# Patient Record
Sex: Female | Born: 1950
Health system: Southern US, Community
[De-identification: ages and names within clinical notes are randomized; demographics above are authoritative.]

## PROBLEM LIST (undated history)

## (undated) ENCOUNTER — Ambulatory Visit: Admission: EM

## (undated) DIAGNOSIS — C9000 Multiple myeloma not having achieved remission: Secondary | ICD-10-CM

## (undated) DIAGNOSIS — L659 Nonscarring hair loss, unspecified: Secondary | ICD-10-CM

## (undated) DIAGNOSIS — I712 Thoracic aortic aneurysm, without rupture, unspecified: Secondary | ICD-10-CM

## (undated) DIAGNOSIS — M25561 Pain in right knee: Secondary | ICD-10-CM

## (undated) DIAGNOSIS — R430 Anosmia: Secondary | ICD-10-CM

## (undated) DIAGNOSIS — I10 Essential (primary) hypertension: Secondary | ICD-10-CM

## (undated) DIAGNOSIS — C801 Malignant (primary) neoplasm, unspecified: Secondary | ICD-10-CM

## (undated) DIAGNOSIS — R0602 Shortness of breath: Secondary | ICD-10-CM

## (undated) DIAGNOSIS — E785 Hyperlipidemia, unspecified: Secondary | ICD-10-CM

## (undated) DIAGNOSIS — M2550 Pain in unspecified joint: Secondary | ICD-10-CM

## (undated) DIAGNOSIS — D72819 Decreased white blood cell count, unspecified: Secondary | ICD-10-CM

## (undated) DIAGNOSIS — D649 Anemia, unspecified: Secondary | ICD-10-CM

## (undated) DIAGNOSIS — M25562 Pain in left knee: Secondary | ICD-10-CM

## (undated) DIAGNOSIS — M549 Dorsalgia, unspecified: Secondary | ICD-10-CM

## (undated) DIAGNOSIS — K5900 Constipation, unspecified: Secondary | ICD-10-CM

## (undated) DIAGNOSIS — E669 Obesity, unspecified: Secondary | ICD-10-CM

## (undated) DIAGNOSIS — G629 Polyneuropathy, unspecified: Secondary | ICD-10-CM

## (undated) DIAGNOSIS — R7989 Other specified abnormal findings of blood chemistry: Secondary | ICD-10-CM

## (undated) DIAGNOSIS — K0889 Other specified disorders of teeth and supporting structures: Secondary | ICD-10-CM

## (undated) HISTORY — DX: Shortness of breath: R06.02

## (undated) HISTORY — DX: Anemia, unspecified: D64.9

## (undated) HISTORY — DX: Thoracic aortic aneurysm, without rupture: I71.2

## (undated) HISTORY — PX: CATARACT EXTRACTION: SUR2

## (undated) HISTORY — DX: Pain in right knee: M25.561

## (undated) HISTORY — DX: Multiple myeloma not having achieved remission: C90.00

## (undated) HISTORY — DX: Polyneuropathy, unspecified: G62.9

## (undated) HISTORY — DX: Other specified disorders of teeth and supporting structures: K08.89

## (undated) HISTORY — PX: INJECTION KNEE: SHX2446

## (undated) HISTORY — DX: Hyperlipidemia, unspecified: E78.5

## (undated) HISTORY — DX: Anosmia: R43.0

## (undated) HISTORY — DX: Constipation, unspecified: K59.00

## (undated) HISTORY — DX: Malignant (primary) neoplasm, unspecified: C80.1

## (undated) HISTORY — DX: Other specified abnormal findings of blood chemistry: R79.89

## (undated) HISTORY — DX: Decreased white blood cell count, unspecified: D72.819

## (undated) HISTORY — PX: LIMBAL STEM CELL TRANSPLANT: SHX1969

## (undated) HISTORY — DX: Pain in unspecified joint: M25.50

## (undated) HISTORY — DX: Dorsalgia, unspecified: M54.9

## (undated) HISTORY — DX: Nonscarring hair loss, unspecified: L65.9

## (undated) HISTORY — PX: EYE SURGERY: SHX253

## (undated) HISTORY — DX: Thoracic aortic aneurysm, without rupture, unspecified: I71.20

## (undated) HISTORY — DX: Pain in right knee: M25.562

## (undated) HISTORY — DX: Obesity, unspecified: E66.9

---

## 1998-07-03 ENCOUNTER — Other Ambulatory Visit: Admission: RE | Admit: 1998-07-03 | Discharge: 1998-07-03 | Payer: Self-pay | Admitting: Obstetrics and Gynecology

## 1999-11-09 ENCOUNTER — Other Ambulatory Visit: Admission: RE | Admit: 1999-11-09 | Discharge: 1999-11-09 | Payer: Self-pay | Admitting: Obstetrics and Gynecology

## 2000-01-19 ENCOUNTER — Encounter: Payer: Self-pay | Admitting: Family Medicine

## 2000-01-19 ENCOUNTER — Encounter: Admission: RE | Admit: 2000-01-19 | Discharge: 2000-01-19 | Payer: Self-pay | Admitting: Family Medicine

## 2002-01-04 ENCOUNTER — Other Ambulatory Visit: Admission: RE | Admit: 2002-01-04 | Discharge: 2002-01-04 | Payer: Self-pay | Admitting: Family Medicine

## 2012-12-27 ENCOUNTER — Other Ambulatory Visit (HOSPITAL_COMMUNITY)
Admission: RE | Admit: 2012-12-27 | Discharge: 2012-12-27 | Disposition: A | Discharge status: Home / Self Care | Payer: BC Managed Care – PPO | Source: Ambulatory Visit | Attending: Family Medicine | Admitting: Family Medicine

## 2012-12-27 DIAGNOSIS — Z01419 Encounter for gynecological examination (general) (routine) without abnormal findings: Secondary | ICD-10-CM | POA: Insufficient documentation

## 2012-12-27 DIAGNOSIS — Z1151 Encounter for screening for human papillomavirus (HPV): Secondary | ICD-10-CM | POA: Insufficient documentation

## 2013-11-21 ENCOUNTER — Ambulatory Visit
Admission: RE | Admit: 2013-11-21 | Discharge: 2013-11-21 | Disposition: A | Discharge status: Home / Self Care | Payer: BC Managed Care – PPO | Source: Ambulatory Visit | Attending: Allergy and Immunology | Admitting: Allergy and Immunology

## 2013-11-21 ENCOUNTER — Other Ambulatory Visit: Payer: Self-pay | Admitting: Allergy and Immunology

## 2013-11-21 DIAGNOSIS — R059 Cough, unspecified: Secondary | ICD-10-CM

## 2013-11-21 DIAGNOSIS — R05 Cough: Secondary | ICD-10-CM

## 2016-02-12 ENCOUNTER — Encounter: Payer: Self-pay | Admitting: Hematology and Oncology

## 2016-02-12 ENCOUNTER — Telehealth: Payer: Self-pay | Admitting: Hematology and Oncology

## 2016-02-12 NOTE — Telephone Encounter (Signed)
Pt returned my call. Appt scheduled with Gudena on 10/24 at 1pm. Demographics verified, letter mailed to the patient.Voiced understanding.

## 2016-03-09 ENCOUNTER — Ambulatory Visit (HOSPITAL_BASED_OUTPATIENT_CLINIC_OR_DEPARTMENT_OTHER): Payer: BC Managed Care – PPO | Admitting: Hematology and Oncology

## 2016-03-09 ENCOUNTER — Encounter: Payer: Self-pay | Admitting: Hematology and Oncology

## 2016-03-09 VITALS — BP 135/62 | HR 77 | Temp 98.2°F | Resp 18 | Ht 69.0 in | Wt 283.8 lb

## 2016-03-09 DIAGNOSIS — D649 Anemia, unspecified: Secondary | ICD-10-CM | POA: Diagnosis not present

## 2016-03-09 NOTE — Progress Notes (Signed)
[facility] NOTE  Patient Care Team: Lucianne Lei, MD as PCP - General (Family Medicine)  CHIEF COMPLAINTS/PURPOSE OF CONSULTATION:  Anemia and mild leukopenia  HISTORY OF PRESENTING ILLNESS:  Carolyn Newman 65 y.o. female is here because of her history of anemia and leukopenia. Patient does with that she was told that she may have anemia about a year ago. Apparently it was not quite severe. Or time it appears that her hemoglobin appears to decline slowly. Most recent hemoglobin was 9.7 in August 2017. She was also noted to have mild leukopenia. She has not had any recent infections or illnesses. Extensive workup done by her primary care physician revealed that she has normal iron studies, normal H-82 and folic acid and normal TSH. Patient also normal kidney and liver function.patient has not had any episodes of bleeding.  I reviewed her records extensively and collaborated the history with the patient.  MEDICAL HISTORY:  Diabetes, hypertension, hyperlipidemia SURGICAL HISTORY: tonsillectomy SOCIAL HISTORY: Denies any tobacco alcohol or depression drug use FAMILY HISTORY: Positive for anemia ALLERGIES:  has no allergies on file.  MEDICATIONS:  Current Outpatient Prescriptions  Medication Sig Dispense Refill  . ASPIRIN 81 PO Take 81 mg by mouth daily.    . Cholecalciferol (VITAMIN D3 PO) Take 5,000 Int'l Units by mouth daily.    . Cyanocobalamin (VITAMIN B-12 PO) Take 5,000 mcg by mouth 2 (two) times daily.    Marland Kitchen FIBER PO Take by mouth daily.    . Iron-FA-B Cmp-C-Biot-Probiotic (FUSION PLUS) CAPS TK 1 C PO QD  2  . losartan-hydrochlorothiazide (HYZAAR) 100-25 MG tablet     . Multiple Vitamin (MULTIVITAMIN) tablet Take 1 tablet by mouth daily.    . Omega-3 Fatty Acids (OMEGA-3 FISH OIL PO) Take by mouth daily.     No current facility-administered medications for this visit.     REVIEW OF SYSTEMS:   Constitutional: Denies fevers, chills or abnormal night  sweats Eyes: Denies blurriness of vision, double vision or watery eyes Ears, nose, mouth, throat, and face: Denies mucositis or sore throat Respiratory: Denies cough, dyspnea or wheezes Cardiovascular: Denies palpitation, chest discomfort or lower extremity swelling Gastrointestinal:  Denies nausea, heartburn or change in bowel habits Skin: Denies abnormal skin rashes Lymphatics: Denies new lymphadenopathy or easy bruising Neurological:Denies numbness, tingling or new weaknesses Behavioral/Psych: Mood is stable, no new changes   All other systems were reviewed with the patient and are negative.  PHYSICAL EXAMINATION: ECOG PERFORMANCE STATUS: 1 - Symptomatic but completely ambulatory  Vitals:   03/09/16 1253  BP: 135/62  Pulse: 77  Resp: 18  Temp: 98.2 F (36.8 C)   Filed Weights   03/09/16 1253  Weight: 283 lb 12.8 oz (128.7 kg)    GENERAL:alert, no distress and comfortable SKIN: skin color, texture, turgor are normal, no rashes or significant lesions EYES: normal, conjunctiva are pink and non-injected, sclera clear OROPHARYNX:no exudate, no erythema and lips, buccal mucosa, and tongue normal  NECK: supple, thyroid normal size, non-tender, without nodularity LYMPH:  no palpable lymphadenopathy in the cervical, axillary or inguinal LUNGS: clear to auscultation and percussion with normal breathing effort HEART: regular rate & rhythm and no murmurs and no lower extremity edema ABDOMEN:abdomen soft, non-tender and normal bowel sounds Musculoskeletal:no cyanosis of digits and no clubbing  PSYCH: alert & oriented x 3 with fluent speech NEURO: no focal motor/sensory deficits  LABORATORY DATA:  I have reviewed the data as listed No results found for: WBC, HGB, HCT,  MCV, PLT No results found for: NA, K, CL, CO2  RADIOGRAPHIC STUDIES: I have personally reviewed the radiological reports and agreed with the findings in the report.  ASSESSMENT AND PLAN:  Normocytic anemia: I  discussed with the patient the differential diagnosis of normocytic anemia includes 1. Combined I-51 and folic acid deficiencies 2. Anemia of chronic disease 3. Hypothyroidism 4. Multiple myeloma 5. Bone marrow disorders like MDS  Blood work done by her primary care physician did not show any evidence of iron deficiency. There was no evidence of any G-98 or folic acid deficiencies. Kidney and renal functions along with serum calcium levels were normal. Based on at least a one-year duration of her anemia, recommended that she obtain a bone marrow biopsy. We will also draw blood work for serum protein electrophoresis.  Return to clinic after bone marrow biopsy.  All questions were answered. The patient knows to call the clinic with any problems, questions or concerns.  Rulon Eisenmenger, MD 03/09/16

## 2016-03-16 ENCOUNTER — Other Ambulatory Visit: Payer: BC Managed Care – PPO

## 2016-03-19 ENCOUNTER — Other Ambulatory Visit: Payer: Self-pay | Admitting: Physician Assistant

## 2016-03-22 ENCOUNTER — Ambulatory Visit (HOSPITAL_COMMUNITY)
Admission: RE | Admit: 2016-03-22 | Discharge: 2016-03-22 | Disposition: A | Discharge status: Home / Self Care | Payer: BC Managed Care – PPO | Source: Ambulatory Visit | Attending: Hematology and Oncology | Admitting: Hematology and Oncology

## 2016-03-22 ENCOUNTER — Encounter (HOSPITAL_COMMUNITY): Payer: Self-pay

## 2016-03-22 DIAGNOSIS — D72819 Decreased white blood cell count, unspecified: Secondary | ICD-10-CM | POA: Diagnosis not present

## 2016-03-22 DIAGNOSIS — D708 Other neutropenia: Secondary | ICD-10-CM | POA: Diagnosis not present

## 2016-03-22 DIAGNOSIS — D649 Anemia, unspecified: Secondary | ICD-10-CM

## 2016-03-22 HISTORY — DX: Essential (primary) hypertension: I10

## 2016-03-22 LAB — CBC
HCT: 31.9 % — ABNORMAL LOW (ref 36.0–46.0)
Hemoglobin: 10.4 g/dL — ABNORMAL LOW (ref 12.0–15.0)
MCH: 30.3 pg (ref 26.0–34.0)
MCHC: 32.6 g/dL (ref 30.0–36.0)
MCV: 93 fL (ref 78.0–100.0)
Platelets: 241 10*3/uL (ref 150–400)
RBC: 3.43 MIL/uL — ABNORMAL LOW (ref 3.87–5.11)
RDW: 16.3 % — ABNORMAL HIGH (ref 11.5–15.5)
WBC: 3.5 10*3/uL — ABNORMAL LOW (ref 4.0–10.5)

## 2016-03-22 LAB — BONE MARROW EXAM

## 2016-03-22 LAB — PROTIME-INR
INR: 1.05
Prothrombin Time: 13.8 seconds (ref 11.4–15.2)

## 2016-03-22 LAB — APTT: aPTT: 29 seconds (ref 24–36)

## 2016-03-22 MED ORDER — FENTANYL CITRATE (PF) 100 MCG/2ML IJ SOLN
INTRAMUSCULAR | Status: AC
  Filled 2016-03-22: qty 4

## 2016-03-22 MED ORDER — FENTANYL CITRATE (PF) 100 MCG/2ML IJ SOLN
INTRAMUSCULAR | Status: AC | PRN
  Administered 2016-03-22 (×2): 25 ug via INTRAVENOUS

## 2016-03-22 MED ORDER — MIDAZOLAM HCL 2 MG/2ML IJ SOLN
INTRAMUSCULAR | Status: AC
  Filled 2016-03-22: qty 4

## 2016-03-22 MED ORDER — SODIUM CHLORIDE 0.9 % IV SOLN
INTRAVENOUS | Status: DC
  Administered 2016-03-22: 08:00:00 via INTRAVENOUS

## 2016-03-22 MED ORDER — MIDAZOLAM HCL 2 MG/2ML IJ SOLN
INTRAMUSCULAR | Status: AC | PRN
  Administered 2016-03-22 (×2): 0.5 mg via INTRAVENOUS

## 2016-03-22 NOTE — Consult Note (Signed)
Chief Complaint: Patient was seen in consultation today for CT guided bone marrow biopsy   Referring Physician(s): Brinckerhoff  Supervising Physician: Aletta Edouard  Patient Status: [facility] - Out-pt  History of Present Illness: Carolyn Newman is a 65 y.o. female with history of persistent anemia and leukopenia of unknown origin who presents today for CT guided bone marrow biopsy for further evaluation.   Past Medical History:  Diagnosis Date  . Hypertension     Past Surgical History:  Procedure Laterality Date  . EYE SURGERY      Allergies: Patient has no allergy information on record.  Medications: Prior to Admission medications   Medication Sig Start Date End Date Taking? Authorizing Provider  ASPIRIN 81 PO Take 81 mg by mouth daily.   Yes Historical Provider, MD  Cholecalciferol (VITAMIN D3 PO) Take 5,000 Int'l Units by mouth daily.   Yes Historical Provider, MD  Cyanocobalamin (VITAMIN B-12 PO) Take 5,000 mcg by mouth 2 (two) times daily.   Yes Historical Provider, MD  FIBER PO Take by mouth daily.   Yes Historical Provider, MD  Iron-FA-B Cmp-C-Biot-Probiotic (FUSION PLUS) CAPS TK 1 C PO QD 01/31/16  Yes Historical Provider, MD  losartan-hydrochlorothiazide (HYZAAR) 100-25 MG tablet  02/21/16  Yes Historical Provider, MD  Multiple Vitamin (MULTIVITAMIN) tablet Take 1 tablet by mouth daily.   Yes Historical Provider, MD  Omega-3 Fatty Acids (OMEGA-3 FISH OIL PO) Take by mouth daily.   Yes Historical Provider, MD     History reviewed. No pertinent family history.  Social History   Social History  . Marital status: Single    Spouse name: N/A  . Number of children: N/A  . Years of education: N/A   Social History Main Topics  . Smoking status: Never Smoker  . Smokeless tobacco: Never Used  . Alcohol use No  . Drug use: No  . Sexual activity: Not Asked   Other Topics Concern  . None   Social History Narrative  . None      Review of Systems denies  fever,HA,CP,dyspnea, cough, abd/back pain,N/V or bleeding  Vital Signs: BP 116/76 (BP Location: Right Arm)   Pulse 77   Temp 98 F (36.7 C) (Oral)   Resp 18   SpO2 100%   Physical Exam awake/alert; chest- CTA bilat; heart- RRR; abd- obese, soft,+BS,NT; LE- no edema  Mallampati Score:     Imaging: No results found.  Labs:  CBC:  Recent Labs  03/22/16 0720  WBC 3.5*  HGB 10.4*  HCT 31.9*  PLT 241    COAGS:  Recent Labs  03/22/16 0720  INR 1.05  APTT 29    BMP: No results for input(s): NA, K, CL, CO2, GLUCOSE, BUN, CALCIUM, CREATININE, GFRNONAA, GFRAA in the last 8760 hours.  Invalid input(s): CMP  LIVER FUNCTION TESTS: No results for input(s): BILITOT, AST, ALT, ALKPHOS, PROT, ALBUMIN in the last 8760 hours.  TUMOR MARKERS: No results for input(s): AFPTM, CEA, CA199, CHROMGRNA in the last 8760 hours.  Assessment and Plan: 65 y.o. female with history of persistent anemia and leukopenia of unknown origin who presents today for CT guided bone marrow biopsy for further evaluation. Risks and benefits discussed with the patient including, but not limited to bleeding, infection, damage to adjacent structures or low yield requiring additional tests.All of the patient's questions were answered, patient is agreeable to proceed. Consent signed and in chart.     Thank you for this interesting consult.  I greatly enjoyed  meeting Carolyn Newman and look forward to participating in their care.  A copy of this report was sent to the requesting provider on this date.  Electronically Signed: D. Kevin Allred 03/22/2016, 8:28 AM   I spent a total of 20 minutes in face to face in clinical consultation, greater than 50% of which was counseling/coordinating care for CT guided bone marrow biopsy        

## 2016-03-22 NOTE — Discharge Instructions (Signed)
Moderate Conscious Sedation, Adult, Care After °Refer to this sheet in the next few weeks. These instructions provide you with information on caring for yourself after your procedure. Your health care provider may also give you more specific instructions. Your treatment has been planned according to current medical practices, but problems sometimes occur. Call your health care provider if you have any problems or questions after your procedure. °WHAT TO EXPECT AFTER THE PROCEDURE  °After your procedure: °· You may feel sleepy, clumsy, and have poor balance for several hours. °· Vomiting may occur if you eat too soon after the procedure. °HOME CARE INSTRUCTIONS °· Do not participate in any activities where you could become injured for at least 24 hours. Do not: °¨ Drive. °¨ Swim. °¨ Ride a bicycle. °¨ Operate heavy machinery. °¨ Cook. °¨ Use power tools. °¨ Climb ladders. °¨ Work from a high place. °· Do not make important decisions or sign legal documents until you are improved. °· If you vomit, drink water, juice, or soup when you can drink without vomiting. Make sure you have little or no nausea before eating solid foods. °· Only take over-the-counter or prescription medicines for pain, discomfort, or fever as directed by your health care provider. °· Make sure you and your family fully understand everything about the medicines given to you, including what side effects may occur. °· You should not drink alcohol, take sleeping pills, or take medicines that cause drowsiness for at least 24 hours. °· If you smoke, do not smoke without supervision. °· If you are feeling better, you may resume normal activities 24 hours after you were sedated. °· Keep all appointments with your health care provider. °SEEK MEDICAL CARE IF: °· Your skin is pale or bluish in color. °· You continue to feel nauseous or vomit. °· Your pain is getting worse and is not helped by medicine. °· You have bleeding or swelling. °· You are still  sleepy or feeling clumsy after 24 hours. °SEEK IMMEDIATE MEDICAL CARE IF: °· You develop a rash. °· You have difficulty breathing. °· You develop any type of allergic problem. °· You have a fever. °MAKE SURE YOU: °· Understand these instructions. °· Will watch your condition. °· Will get help right away if you are not doing well or get worse. °  °This information is not intended to replace advice given to you by your health care provider. Make sure you discuss any questions you have with your health care provider. °  °Document Released: 02/21/2013 Document Revised: 05/24/2014 Document Reviewed: 02/21/2013 °Elsevier Interactive Patient Education ©2016 Elsevier Inc. °Bone Marrow Aspiration and Bone Marrow Biopsy, Care After °Refer to this sheet in the next few weeks. These instructions provide you with information about caring for yourself after your procedure. Your health care provider may also give you more specific instructions. Your treatment has been planned according to current medical practices, but problems sometimes occur. Call your health care provider if you have any problems or questions after your procedure. °WHAT TO EXPECT AFTER THE PROCEDURE °After your procedure, it is common to have: °· Soreness or tenderness around the puncture site. °· Bruising. °HOME CARE INSTRUCTIONS °· Take medicines only as directed by your health care provider. °· Follow your health care provider's instructions about: °¨ Puncture site care. °¨ Bandage (dressing) changes and removal. °· Bathe and shower as directed by your health care provider. °· Check your puncture site every day for signs of infection. Watch for: °¨ Redness, swelling, or pain. °¨   pain.  Fluid, blood, or pus.  Return to your normal activities as directed by your health care provider.  Keep all follow-up visits as directed by your health care provider. This is important. SEEK MEDICAL CARE IF:  You have a fever.  You have uncontrollable bleeding.  You have  redness, swelling, or pain at the site of your puncture.  You have fluid, blood, or pus coming from your puncture site.   This information is not intended to replace advice given to you by your health care provider. Make sure you discuss any questions you have with your health care provider.   Document Released: 11/20/2004 Document Revised: 09/17/2014 Document Reviewed: 04/24/2014 Elsevier Interactive Patient Education [employer].

## 2016-03-22 NOTE — Procedures (Signed)
Interventional Radiology Procedure Note  Procedure: CT guided aspirate and core biopsy of right iliac bone Complications: None Recommendations: - Bedrest supine x 1 hrs - Follow biopsy results  Konner Saiz T. Farha Dano, M.D Pager:  [phone]   

## 2016-03-30 LAB — TISSUE HYBRIDIZATION (BONE MARROW)-NCBH

## 2016-03-30 LAB — CHROMOSOME ANALYSIS, BONE MARROW

## 2016-04-04 DIAGNOSIS — C9001 Multiple myeloma in remission: Secondary | ICD-10-CM | POA: Insufficient documentation

## 2016-04-04 DIAGNOSIS — C9000 Multiple myeloma not having achieved remission: Secondary | ICD-10-CM | POA: Insufficient documentation

## 2016-04-04 NOTE — Assessment & Plan Note (Signed)
Multiple Myeloma: Based on BM Bx showing 17% Plasma cells and 30% by CD 138 testing. SPEP, K:L Ratio, B2MG pending from today. Will get Bone Survey Cytogenetics are pending  Discussed treatment options and determined that the best treatment is with 1. RVD 2. Auto Stem cell transplant.  Offered follow up with Dr.Gorsuch

## 2016-04-05 ENCOUNTER — Ambulatory Visit (HOSPITAL_BASED_OUTPATIENT_CLINIC_OR_DEPARTMENT_OTHER): Payer: BC Managed Care – PPO | Admitting: Hematology and Oncology

## 2016-04-05 ENCOUNTER — Other Ambulatory Visit: Payer: Self-pay | Admitting: Hematology and Oncology

## 2016-04-05 ENCOUNTER — Other Ambulatory Visit: Payer: Self-pay

## 2016-04-05 ENCOUNTER — Encounter: Payer: Self-pay | Admitting: Hematology and Oncology

## 2016-04-05 ENCOUNTER — Telehealth: Payer: Self-pay | Admitting: Hematology and Oncology

## 2016-04-05 ENCOUNTER — Ambulatory Visit (HOSPITAL_BASED_OUTPATIENT_CLINIC_OR_DEPARTMENT_OTHER): Payer: BC Managed Care – PPO

## 2016-04-05 VITALS — BP 140/74 | HR 84 | Temp 97.7°F | Resp 19 | Wt 286.9 lb

## 2016-04-05 DIAGNOSIS — Z9484 Stem cells transplant status: Secondary | ICD-10-CM | POA: Diagnosis not present

## 2016-04-05 DIAGNOSIS — C9000 Multiple myeloma not having achieved remission: Secondary | ICD-10-CM

## 2016-04-05 DIAGNOSIS — C9002 Multiple myeloma in relapse: Secondary | ICD-10-CM

## 2016-04-05 LAB — COMPREHENSIVE METABOLIC PANEL
ALT: 26 U/L (ref 0–55)
AST: 30 U/L (ref 5–34)
Albumin: 3.3 g/dL — ABNORMAL LOW (ref 3.5–5.0)
Alkaline Phosphatase: 83 U/L (ref 40–150)
Anion Gap: 7 mEq/L (ref 3–11)
BUN: 20.1 mg/dL (ref 7.0–26.0)
CO2: 26 mEq/L (ref 22–29)
Calcium: 9.7 mg/dL (ref 8.4–10.4)
Chloride: 104 mEq/L (ref 98–109)
Creatinine: 0.9 mg/dL (ref 0.6–1.1)
EGFR: 75 mL/min/{1.73_m2} — ABNORMAL LOW (ref >90)
Glucose: 97 mg/dl (ref 70–140)
Potassium: 3.8 mEq/L (ref 3.5–5.1)
Sodium: 137 mEq/L (ref 136–145)
Total Bilirubin: 0.4 mg/dL (ref 0.20–1.20)
Total Protein: 8.5 g/dL — ABNORMAL HIGH (ref 6.4–8.3)

## 2016-04-05 LAB — CBC WITH DIFFERENTIAL/PLATELET
BASO%: 0.3 % (ref 0.0–2.0)
Basophils Absolute: 0 10*3/uL (ref 0.0–0.1)
EOS%: 2.2 % (ref 0.0–7.0)
Eosinophils Absolute: 0.1 10*3/uL (ref 0.0–0.5)
HCT: 32.3 % — ABNORMAL LOW (ref 34.8–46.6)
HGB: 10.4 g/dL — ABNORMAL LOW (ref 11.6–15.9)
LYMPH%: 30.5 % (ref 14.0–49.7)
MCH: 30.4 pg (ref 25.1–34.0)
MCHC: 32.2 g/dL (ref 31.5–36.0)
MCV: 94.4 fL (ref 79.5–101.0)
MONO#: 0.2 10*3/uL (ref 0.1–0.9)
MONO%: 7.4 % (ref 0.0–14.0)
NEUT#: 1.9 10*3/uL (ref 1.5–6.5)
NEUT%: 59.6 % (ref 38.4–76.8)
Platelets: 237 10*3/uL (ref 145–400)
RBC: 3.42 10*6/uL — ABNORMAL LOW (ref 3.70–5.45)
RDW: 16.3 % — ABNORMAL HIGH (ref 11.2–14.5)
WBC: 3.3 10*3/uL — ABNORMAL LOW (ref 3.9–10.3)
lymph#: 1 10*3/uL (ref 0.9–3.3)
nRBC: 0 % (ref 0–0)

## 2016-04-05 MED ORDER — LENALIDOMIDE 10 MG PO CAPS: 10.0000 mg | ORAL_CAPSULE | Freq: Every day | ORAL | 0 refills | Status: DC

## 2016-04-05 NOTE — Progress Notes (Signed)
Pt set up and registered on [place] website for future Revlimid treatment, per Dr. Lindi Adie request.

## 2016-04-05 NOTE — Telephone Encounter (Signed)
Gave patient avs report and appointments for December and January. Per 11/20 los from [facility] patient to f/u w/NG 12/4 and start tx.   Central radiology will call re bone survey.

## 2016-04-05 NOTE — Progress Notes (Signed)
Patient Care Team: Lucianne Lei, MD as PCP - General (Family Medicine)  DIAGNOSIS:  Encounter Diagnosis  Name Primary?  . Multiple myeloma not having achieved remission ([place])     SUMMARY OF ONCOLOGIC HISTORY:   Multiple myeloma ([place])   03/25/2016 Initial Diagnosis    Bone Marrow Biopsy: Plasma cells 17% by Aspirate and 30% by CD 138 stain. Plasma cell neoplasm, Kappa Restricted       CHIEF COMPLIANT: Follow-up to discuss a bone marrow biopsy report  INTERVAL HISTORY: Carolyn Newman is a 65 year old with above-mentioned history of recent bone marrow biopsy showing excess plasma cells. She was originally referred to me for workup of anemia and leukopenia. Prior workup by Dr. Criss Rosales did not reveal any abnormalities of iron or folic acid or R-74 or TSH. She also had normal kidney and liver function tests. There was no bleeding episodes. Because the anemia was normocytic, we obtained a bone marrow biopsy. She is here today to discuss the biopsy report. Previous workup ordered was unfortunately not completed. She was supposed to have serum protein electrophoresis.  REVIEW OF SYSTEMS:   Constitutional: Denies fevers, chills or abnormal weight loss, fatigue Eyes: Denies blurriness of vision Ears, nose, mouth, throat, and face: Denies mucositis or sore throat Respiratory: Denies cough, dyspnea or wheezes Cardiovascular: Denies palpitation, chest discomfort Gastrointestinal:  Denies nausea, heartburn or change in bowel habits Skin: Denies abnormal skin rashes Lymphatics: Denies new lymphadenopathy or easy bruising Neurological:Denies numbness, tingling or new weaknesses Behavioral/Psych: Mood is stable, no new changes  Extremities: No lower extremity edema All other systems were reviewed with the patient and are negative.  I have reviewed the past medical history, past surgical history, social history and family history with the patient and they are unchanged from previous  note.  ALLERGIES:  has no allergies on file.  MEDICATIONS:  Current Outpatient Prescriptions  Medication Sig Dispense Refill  . ASPIRIN 81 PO Take 81 mg by mouth daily.    . Cholecalciferol (VITAMIN D3 PO) Take 5,000 Int'l Units by mouth daily.    . Cyanocobalamin (VITAMIN B-12 PO) Take 5,000 mcg by mouth 2 (two) times daily.    Marland Kitchen FIBER PO Take by mouth daily.    . Iron-FA-B Cmp-C-Biot-Probiotic (FUSION PLUS) CAPS TK 1 C PO QD  2  . losartan-hydrochlorothiazide (HYZAAR) 100-25 MG tablet     . Multiple Vitamin (MULTIVITAMIN) tablet Take 1 tablet by mouth daily.    . Omega-3 Fatty Acids (OMEGA-3 FISH OIL PO) Take by mouth daily.     No current facility-administered medications for this visit.     PHYSICAL EXAMINATION: ECOG PERFORMANCE STATUS: 1 - Symptomatic but completely ambulatory  There were no vitals filed for this visit. There were no vitals filed for this visit.  GENERAL:alert, no distress and comfortable SKIN: skin color, texture, turgor are normal, no rashes or significant lesions EYES: normal, Conjunctiva are pink and non-injected, sclera clear OROPHARYNX:no exudate, no erythema and lips, buccal mucosa, and tongue normal  NECK: supple, thyroid normal size, non-tender, without nodularity LYMPH:  no palpable lymphadenopathy in the cervical, axillary or inguinal LUNGS: clear to auscultation and percussion with normal breathing effort HEART: regular rate & rhythm and no murmurs and no lower extremity edema ABDOMEN:abdomen soft, non-tender and normal bowel sounds MUSCULOSKELETAL:no cyanosis of digits and no clubbing  NEURO: alert & oriented x 3 with fluent speech, no focal motor/sensory deficits EXTREMITIES: No lower extremity edema  LABORATORY DATA:  I have reviewed the data as  listed   Chemistry   No results found for: NA, K, CL, CO2, BUN, CREATININE, GLU No results found for: CALCIUM, ALKPHOS, AST, ALT, BILITOT     Lab Results  Component Value Date   WBC 3.5 (L)  03/22/2016   HGB 10.4 (L) 03/22/2016   HCT 31.9 (L) 03/22/2016   MCV 93.0 03/22/2016   PLT 241 03/22/2016    ASSESSMENT & PLAN:  Multiple myeloma ([facility]) Multiple Myeloma: Based on BM Bx showing 17% Plasma cells and 30% by CD 138 testing. SPEP, K:L Ratio, B2MG pending from today. Will get Bone Survey Cytogenetics are pending With also get 24 hour urine for light chains  Discussed treatment options and determined that the best treatment is with  1. RVD: Revlimid 3 weeks on one week off; Velcade days 1,4,8,11 every 4 weeks subcutaneous, dexamethasone 20 mg orally weekly. I discussed the risks and benefits of the treatments. Revlimid will be a pain through the STEPS program.   Patient understands the toxicities of Revlimid could include cytopenias, rashes, GI toxicities, bone marrow toxicities etc. The side effects of Velcade include thrombocytopenia, neuropathy Side effects of dexamethasone include reactivation of shingles, elevated blood sugars etc Patient understands these risks and is willing to proceed with the treatment.  2. Auto Stem cell transplant.  Patient will follow up with Dr.Gorsuch on December 4 to start therapy.  No orders of the defined types were placed in this encounter.  The patient has a good understanding of the overall plan. she agrees with it. she will call with any problems that may develop before the next visit here.   Rulon Eisenmenger, MD 04/05/16

## 2016-04-05 NOTE — Progress Notes (Signed)
START ON PATHWAY REGIMEN - Multiple Myeloma  [id number]: VRd (Bortezomib 1.3 mg/m2 Subcut D1, 4, 8, 11 + Lenalidomide 25 mg + Dexamethasone 20 mg) q21 Days x 4-6 Cycles Maximum Prior to Stem Cell Harvest   A cycle is every 21 days:     Bortezomib (Velcade(R)) 1.3 mg/m2 subcut twice weekly on days 1,4,8, and 11 Dose Mod: None     Lenalidomide (Revlimid(R)) 25 mg orally days 1-14 Dose Mod: None     Dexamethasone (Decadron(R)) 20 mg orally days 1, 2, 4, 5, 8, 9, 11, and 12 Dose Mod: None Additional Orders: Herpes zoster prophylaxis recommended. Antithrombotic therapy: aspirin 81-325 mg PO daily for patients at low risk; enoxaparin 40 mg subcut daily or warfarin to an INR of 2-3 for patients at higher risk.  **Always confirm dose/schedule in your pharmacy ordering system**    Patient Characteristics: Newly Diagnosed, Transplant Eligible, Standard Risk R-ISS Staging: Unknown Disease Classification: Newly Diagnosed Is Patient Eligible for Transplant? Transplant Eligible Risk Status: Standard Risk  Intent of Therapy: Non-Curative / Palliative Intent, Discussed with Patient

## 2016-04-06 ENCOUNTER — Other Ambulatory Visit: Payer: Self-pay | Admitting: *Deleted

## 2016-04-06 LAB — KAPPA/LAMBDA LIGHT CHAINS
Ig Kappa Free Light Chain: 76 mg/L — ABNORMAL HIGH (ref 3.3–19.4)
Ig Lambda Free Light Chain: 9 mg/L (ref 5.7–26.3)
Kappa/Lambda FluidC Ratio: 8.44 — ABNORMAL HIGH (ref 0.26–1.65)

## 2016-04-06 LAB — BETA 2 MICROGLOBULIN, SERUM: Beta-2: 2.2 mg/L (ref 0.6–2.4)

## 2016-04-06 MED ORDER — LENALIDOMIDE 10 MG PO CAPS: 10.0000 mg | ORAL_CAPSULE | Freq: Every day | ORAL | 0 refills | Status: DC

## 2016-04-07 ENCOUNTER — Telehealth: Payer: Self-pay | Admitting: *Deleted

## 2016-04-07 ENCOUNTER — Encounter (HOSPITAL_COMMUNITY): Payer: Self-pay

## 2016-04-07 ENCOUNTER — Telehealth: Payer: Self-pay | Admitting: Pharmacist

## 2016-04-07 LAB — MULTIPLE MYELOMA PANEL, SERUM
Albumin SerPl Elph-Mcnc: 3.6 g/dL (ref 2.9–4.4)
Albumin/Glob SerPl: 0.9 (ref 0.7–1.7)
Alpha 1: 0.2 g/dL (ref 0.0–0.4)
Alpha2 Glob SerPl Elph-Mcnc: 0.7 g/dL (ref 0.4–1.0)
B-Globulin SerPl Elph-Mcnc: 1 g/dL (ref 0.7–1.3)
Gamma Glob SerPl Elph-Mcnc: 2.2 g/dL — ABNORMAL HIGH (ref 0.4–1.8)
Globulin, Total: 4.2 g/dL — ABNORMAL HIGH (ref 2.2–3.9)
IgA, Qn, Serum: 56 mg/dL — ABNORMAL LOW (ref 87–352)
IgG, Qn, Serum: 2660 mg/dL — ABNORMAL HIGH (ref 700–1600)
IgM, Qn, Serum: 21 mg/dL — ABNORMAL LOW (ref 26–217)
M Protein SerPl Elph-Mcnc: 2 g/dL — ABNORMAL HIGH
Total Protein: 7.8 g/dL (ref 6.0–8.5)

## 2016-04-07 NOTE — Telephone Encounter (Signed)
Oral Chemotherapy Pharmacist Encounter  Received notification that patient's Revlimid would require prior authorization. [facility] clinical questions answered. Form faxed back to [phone].  Oral Chemo Clinic will continue to follow.  Johny Drilling, PharmD, BCPS 04/07/2016  4:51 PM Oral Chemotherapy Clinic [phone]

## 2016-04-07 NOTE — Telephone Encounter (Signed)
VM from [place] at [place].  States need Prior Auth on Revlimid.  Call phone# [phone] to obtain Prior Auth.

## 2016-04-09 NOTE — Telephone Encounter (Signed)
Oral Chemotherapy Pharmacist Encounter  Received notification from [place] that prior authorization for Revlimid has been approved through 04/09/2017.  I called [facility] to alert them to re-run prescription. Rx did go through and there was a copay. They would no share with me copayment amount, they will only share this information with the patient. [facility] will reach out to patient for copayment issues and to set up delivery, etc.  I LVM for patient with above information, offer for counseling, and offer to help with any copayment issues.  Oral Chemo Clinic will continue to follow.  Johny Drilling, PharmD, BCPS 04/09/2016  1:47 PM Oral Chemotherapy Clinic [phone]

## 2016-04-09 NOTE — Telephone Encounter (Signed)
Oral Chemotherapy Pharmacist Encounter  Patient returned call and had already spoken with [facility], copay $200, patient can afford to pay this amount at this time.  I offered initial counseling and patient declined as she had already spoken with a [facility] Pharmacist, except to talk about safe handling directions for use.  We also briefly discussed the [facility] program surrounding Revlimid, efficacy of use, and dosing schedule of Velcade.  Patient looking forward to chemo education class on 11/28. She will reach out to [facility] if she needs help identifying copayment assistance in the future..  Patient understands and is in agreement with plan.  Oral Chemo Clinic will continue to follow for toxicity and adherence management.  Johny Drilling, PharmD, BCPS 04/09/2016  2:31 PM Oral Chemotherapy Clinic [phone]

## 2016-04-12 ENCOUNTER — Other Ambulatory Visit: Payer: BC Managed Care – PPO

## 2016-04-13 ENCOUNTER — Ambulatory Visit (HOSPITAL_COMMUNITY)
Admission: RE | Admit: 2016-04-13 | Discharge: 2016-04-13 | Disposition: A | Discharge status: Home / Self Care | Payer: BC Managed Care – PPO | Source: Ambulatory Visit | Attending: Hematology and Oncology | Admitting: Hematology and Oncology

## 2016-04-13 ENCOUNTER — Other Ambulatory Visit: Payer: BC Managed Care – PPO

## 2016-04-13 ENCOUNTER — Encounter: Payer: Self-pay | Admitting: *Deleted

## 2016-04-13 DIAGNOSIS — M1712 Unilateral primary osteoarthritis, left knee: Secondary | ICD-10-CM | POA: Insufficient documentation

## 2016-04-13 DIAGNOSIS — C9000 Multiple myeloma not having achieved remission: Secondary | ICD-10-CM | POA: Insufficient documentation

## 2016-04-13 DIAGNOSIS — M19012 Primary osteoarthritis, left shoulder: Secondary | ICD-10-CM | POA: Insufficient documentation

## 2016-04-15 LAB — UIFE/LIGHT CHAINS/TP QN, 24-HR UR
FR KAPPA LT CH,24HR: 157 mg/(24.h)
FR LAMBDA LT CH,24HR: 2 mg/(24.h)
Free Kappa Lt Chains,Ur: 112 mg/L — ABNORMAL HIGH (ref 1.35–24.19)
Free Lambda Lt Chains,Ur: 1.47 mg/L (ref 0.24–6.66)
Kappa/Lambda Ratio,U: 76.19 — ABNORMAL HIGH (ref 2.04–10.37)
PROTEIN,TOTAL,URINE: 6 mg/dL
Prot,24hr calculated: 84 mg/(24.h) (ref 30–150)

## 2016-04-16 ENCOUNTER — Encounter (HOSPITAL_COMMUNITY): Payer: Self-pay

## 2016-04-19 ENCOUNTER — Ambulatory Visit (HOSPITAL_BASED_OUTPATIENT_CLINIC_OR_DEPARTMENT_OTHER): Payer: BC Managed Care – PPO

## 2016-04-19 ENCOUNTER — Ambulatory Visit (HOSPITAL_BASED_OUTPATIENT_CLINIC_OR_DEPARTMENT_OTHER): Payer: BC Managed Care – PPO | Admitting: Hematology and Oncology

## 2016-04-19 ENCOUNTER — Other Ambulatory Visit (HOSPITAL_BASED_OUTPATIENT_CLINIC_OR_DEPARTMENT_OTHER): Payer: BC Managed Care – PPO

## 2016-04-19 ENCOUNTER — Encounter: Payer: Self-pay | Admitting: Hematology and Oncology

## 2016-04-19 ENCOUNTER — Telehealth: Payer: Self-pay | Admitting: Hematology and Oncology

## 2016-04-19 VITALS — BP 137/66 | HR 55 | Temp 98.2°F | Resp 18 | Ht 69.0 in | Wt 285.4 lb

## 2016-04-19 VITALS — BP 128/93 | HR 58 | Temp 98.1°F | Resp 18

## 2016-04-19 DIAGNOSIS — C9000 Multiple myeloma not having achieved remission: Secondary | ICD-10-CM

## 2016-04-19 DIAGNOSIS — D649 Anemia, unspecified: Secondary | ICD-10-CM

## 2016-04-19 DIAGNOSIS — Z5112 Encounter for antineoplastic immunotherapy: Secondary | ICD-10-CM

## 2016-04-19 DIAGNOSIS — D61818 Other pancytopenia: Secondary | ICD-10-CM | POA: Insufficient documentation

## 2016-04-19 LAB — COMPREHENSIVE METABOLIC PANEL
ALT: 16 U/L (ref 0–55)
AST: 22 U/L (ref 5–34)
Albumin: 3.2 g/dL — ABNORMAL LOW (ref 3.5–5.0)
Alkaline Phosphatase: 85 U/L (ref 40–150)
Anion Gap: 9 mEq/L (ref 3–11)
BUN: 20.5 mg/dL (ref 7.0–26.0)
CO2: 25 mEq/L (ref 22–29)
Calcium: 9.6 mg/dL (ref 8.4–10.4)
Chloride: 105 mEq/L (ref 98–109)
Creatinine: 0.9 mg/dL (ref 0.6–1.1)
EGFR: 79 mL/min/{1.73_m2} — ABNORMAL LOW (ref >90)
Glucose: 80 mg/dl (ref 70–140)
Potassium: 3.7 mEq/L (ref 3.5–5.1)
Sodium: 138 mEq/L (ref 136–145)
Total Bilirubin: 0.46 mg/dL (ref 0.20–1.20)
Total Protein: 8.4 g/dL — ABNORMAL HIGH (ref 6.4–8.3)

## 2016-04-19 LAB — CBC WITH DIFFERENTIAL/PLATELET
BASO%: 0.3 % (ref 0.0–2.0)
Basophils Absolute: 0 10*3/uL (ref 0.0–0.1)
EOS%: 1.9 % (ref 0.0–7.0)
Eosinophils Absolute: 0.1 10*3/uL (ref 0.0–0.5)
HCT: 31.8 % — ABNORMAL LOW (ref 34.8–46.6)
HGB: 10.3 g/dL — ABNORMAL LOW (ref 11.6–15.9)
LYMPH%: 40.6 % (ref 14.0–49.7)
MCH: 30.6 pg (ref 25.1–34.0)
MCHC: 32.4 g/dL (ref 31.5–36.0)
MCV: 94.4 fL (ref 79.5–101.0)
MONO#: 0.2 10*3/uL (ref 0.1–0.9)
MONO%: 7 % (ref 0.0–14.0)
NEUT#: 1.6 10*3/uL (ref 1.5–6.5)
NEUT%: 50.2 % (ref 38.4–76.8)
Platelets: 228 10*3/uL (ref 145–400)
RBC: 3.37 10*6/uL — ABNORMAL LOW (ref 3.70–5.45)
RDW: 15.7 % — ABNORMAL HIGH (ref 11.2–14.5)
WBC: 3.2 10*3/uL — ABNORMAL LOW (ref 3.9–10.3)
lymph#: 1.3 10*3/uL (ref 0.9–3.3)

## 2016-04-19 MED ORDER — ONDANSETRON HCL 8 MG PO TABS: 8.0000 mg | ORAL_TABLET | Freq: Two times a day (BID) | ORAL | 1 refills | Status: DC | PRN

## 2016-04-19 MED ORDER — PROCHLORPERAZINE MALEATE 10 MG PO TABS: 10.0000 mg | ORAL_TABLET | Freq: Four times a day (QID) | ORAL | 1 refills | Status: DC | PRN

## 2016-04-19 MED ORDER — BORTEZOMIB CHEMO SQ INJECTION 3.5 MG (2.5MG/ML)
1.3000 mg/m2 | Freq: Once | INTRAMUSCULAR | Status: AC
  Administered 2016-04-19: 3.25 mg via SUBCUTANEOUS
  Filled 2016-04-19: qty 3.25

## 2016-04-19 MED ORDER — DEXAMETHASONE 4 MG PO TABS: ORAL_TABLET | ORAL | 3 refills | Status: DC

## 2016-04-19 MED ORDER — PROCHLORPERAZINE MALEATE 10 MG PO TABS
10.0000 mg | ORAL_TABLET | Freq: Once | ORAL | Status: AC
  Administered 2016-04-19: 10 mg via ORAL

## 2016-04-19 MED ORDER — PROCHLORPERAZINE MALEATE 10 MG PO TABS
ORAL_TABLET | ORAL | Status: AC
Start: 2016-04-19 — End: 2016-04-19
  Filled 2016-04-19: qty 1

## 2016-04-19 MED ORDER — ACYCLOVIR 400 MG PO TABS
400.0000 mg | ORAL_TABLET | Freq: Two times a day (BID) | ORAL | 3 refills | Status: DC
Start: 2016-04-19 — End: 2016-09-04

## 2016-04-19 NOTE — Assessment & Plan Note (Signed)
The pancytopenia is likely due to untreated bone marrow disease. She is not symptomatic. We will monitor blood counts carefully.

## 2016-04-19 NOTE — Progress Notes (Signed)
Per Dr. Alvy Bimler patient not to receive Zometa today. Patient is to have a baseline dental check-up before proceeding with Zometa. Patient aware and verbalized understanding.  Per Dr. Alvy Bimler, patient not to receive Decadron 40mg  po before treatment today.

## 2016-04-19 NOTE — Patient Instructions (Addendum)
[facility] Discharge Instructions for Patients Receiving Chemotherapy  Today you received the following chemotherapy agents Velcade  To help prevent nausea and vomiting after your treatment, we encourage you to take your nausea medication as directed.   If you develop nausea and vomiting that is not controlled by your nausea medication, call the clinic.   BELOW ARE SYMPTOMS THAT SHOULD BE REPORTED IMMEDIATELY:  *FEVER GREATER THAN 100.5 F  *CHILLS WITH OR WITHOUT FEVER  NAUSEA AND VOMITING THAT IS NOT CONTROLLED WITH YOUR NAUSEA MEDICATION  *UNUSUAL SHORTNESS OF BREATH  *UNUSUAL BRUISING OR BLEEDING  TENDERNESS IN MOUTH AND THROAT WITH OR WITHOUT PRESENCE OF ULCERS  *URINARY PROBLEMS  *BOWEL PROBLEMS  UNUSUAL RASH Items with * indicate a potential emergency and should be followed up as soon as possible.  Feel free to call the clinic you have any questions or concerns. The clinic phone number is [phone].  Please show the CHEMO ALERT CARD at check-in to the Emergency Department and triage nurse.    Bortezomib injection What is this medicine? BORTEZOMIB (bor TEZ oh mib) is a medicine that targets proteins in cancer cells and stops the cancer cells from growing. It is used to treat multiple myeloma and mantle-cell lymphoma. This medicine may be used for other purposes; ask your health care provider or pharmacist if you have questions. COMMON BRAND NAME(S): Velcade What should I tell my health care provider before I take this medicine? They need to know if you have any of these conditions: -diabetes -heart disease -irregular heartbeat -liver disease -on hemodialysis -low blood counts, like low white blood cells, platelets, or hemoglobin -peripheral neuropathy -taking medicine for blood pressure -an unusual or allergic reaction to bortezomib, mannitol, boron, other medicines, foods, dyes, or preservatives -pregnant or trying to get  pregnant -breast-feeding How should I use this medicine? This medicine is for injection into a vein or for injection under the skin. It is given by a health care professional in a hospital or clinic setting. Talk to your pediatrician regarding the use of this medicine in children. Special care may be needed. Overdosage: If you think you have taken too much of this medicine contact a poison control center or emergency room at once. NOTE: This medicine is only for you. Do not share this medicine with others. What if I miss a dose? It is important not to miss your dose. Call your doctor or health care professional if you are unable to keep an appointment. What may interact with this medicine? This medicine may interact with the following medications: -ketoconazole -rifampin -ritonavir -St. John's Wort This list may not describe all possible interactions. Give your health care provider a list of all the medicines, herbs, non-prescription drugs, or dietary supplements you use. Also tell them if you smoke, drink alcohol, or use illegal drugs. Some items may interact with your medicine. What should I watch for while using this medicine? You may get drowsy or dizzy. Do not drive, use machinery, or do anything that needs mental alertness until you know how this medicine affects you. Do not stand or sit up quickly, especially if you are an older patient. This reduces the risk of dizzy or fainting spells. In some cases, you may be given additional medicines to help with side effects. Follow all directions for their use. Call your doctor or health care professional for advice if you get a fever, chills or sore throat, or other symptoms of a cold or flu. Do not   treat yourself. This drug decreases your body's ability to fight infections. Try to avoid being around people who are sick. This medicine may increase your risk to bruise or bleed. Call your doctor or health care professional if you notice any unusual  bleeding. You may need blood work done while you are taking this medicine. In some patients, this medicine may cause a serious brain infection that may cause death. If you have any problems seeing, thinking, speaking, walking, or standing, tell your doctor right away. If you cannot reach your doctor, urgently seek other source of medical care. Check with your doctor or health care professional if you get an attack of severe diarrhea, nausea and vomiting, or if you sweat a lot. The loss of too much body fluid can make it dangerous for you to take this medicine. Do not become pregnant while taking this medicine or for at least 2 months after stopping it. Women should inform their doctor if they wish to become pregnant or think they might be pregnant. Men should not father a child while taking this medicine and for at least 2 months after stopping it. There is a potential for serious side effects to an unborn child. Talk to your health care professional or pharmacist for more information. Do not breast-feed an infant while taking this medicine or for 2 months after stopping it. This medicine may interfere with the ability to have a child. You should talk with your doctor or health care professional if you are concerned about your fertility. What side effects may I notice from receiving this medicine? Side effects that you should report to your doctor or health care professional as soon as possible: -allergic reactions like skin rash, itching or hives, swelling of the face, lips, or tongue -breathing problems -changes in hearing -changes in vision -fast, irregular heartbeat -feeling faint or lightheaded, falls -pain, tingling, numbness in the hands or feet -right upper belly pain -seizures -swelling of the ankles, feet, hands -unusual bleeding or bruising -unusually weak or tired -vomiting -yellowing of the eyes or skin Side effects that usually do not require medical attention (report to your  doctor or health care professional if they continue or are bothersome): -changes in emotions or moods -constipation -diarrhea -loss of appetite -headache -irritation at site where injected -nausea This list may not describe all possible side effects. Call your doctor for medical advice about side effects. You may report side effects to FDA at 1-800-FDA-1088. Where should I keep my medicine? This drug is given in a hospital or clinic and will not be stored at home. NOTE: This sheet is a summary. It may not cover all possible information. If you have questions about this medicine, talk to your doctor, pharmacist, or health care provider.  2017 Elsevier/Gold Standard (2015-10-29 18:30:39)   

## 2016-04-19 NOTE — Progress Notes (Signed)
Patient monitored for 30 minutes post injection. Patient and vital signs stable upon discharge.

## 2016-04-19 NOTE — Progress Notes (Signed)
[name] FOLLOW-UP progress notes  Patient Care Team: Lucianne Lei, MD as PCP - General (Family Medicine)  CHIEF COMPLAINTS/PURPOSE OF VISIT:  Newly diagnosed multiple myeloma, IgG kappa  HISTORY OF PRESENTING ILLNESS:  Carolyn Newman 65 y.o. female was transferred to my care.  I reviewed the patient's records extensive and collaborated the history with the patient. Summary of her history is as follows:   Multiple myeloma ([place])   03/22/2016 Bone Marrow Biopsy    Bone Marrow Biopsy: Plasma cells 17% by Aspirate and 30% by CD 138 stain. Plasma cell neoplasm, Kappa Restricted Normal cytogenetics, FISH positive for +11 and +14 and +7      04/13/2016 Imaging    Skeletal survey showed lucencies within the calvarium worrisome for myeloma. No definite abnormal lytic or blastic lesions are observed elsewhere.      04/19/2016 -  Chemotherapy    The patient consented to treatment with Revlimid, dexamethasone and Velcade      Her anemia was incidental finding. The patient denies symptoms of anemia. She denies history of recurrent infection. She has mild bone aches and chronic bilateral knee pain but denies new bone pain or fractures. The patient has never received blood transfusion. She has donated blood in the past  MEDICAL HISTORY:  Past Medical History:  Diagnosis Date  . Anemia   . Cancer ([place])    myeloma  . Hypertension     SURGICAL HISTORY: Past Surgical History:  Procedure Laterality Date  . EYE SURGERY      SOCIAL HISTORY: Social History   Social History  . Marital status: Single    Spouse name: N/A  . Number of children: N/A  . Years of education: N/A   Occupational History  . consultant    Social History Main Topics  . Smoking status: Never Smoker  . Smokeless tobacco: Never Used  . Alcohol use No  . Drug use: No  . Sexual activity: Not on file   Other Topics Concern  . Not on file   Social History Narrative  . No narrative on file     FAMILY HISTORY: Family History  Problem Relation Age of Onset  . Cancer Mother     lymphoma  . Cancer Father     prostate ca    ALLERGIES:  has No Known Allergies.  MEDICATIONS:  Current Outpatient Prescriptions  Medication Sig Dispense Refill  . ASPIRIN 81 PO Take 81 mg by mouth daily.    . Cholecalciferol (VITAMIN D3 PO) Take 5,000 Int'l Units by mouth daily.    . Cyanocobalamin (VITAMIN B-12 PO) Take 5,000 mcg by mouth 2 (two) times daily.    Marland Kitchen FIBER PO Take by mouth daily.    Marland Kitchen lenalidomide (REVLIMID) 10 MG capsule Take 1 capsule (10 mg total) by mouth daily. Take 1 daily for 14 days, rest 7 days 14 capsule 0  . losartan-hydrochlorothiazide (HYZAAR) 100-25 MG tablet     . Multiple Vitamin (MULTIVITAMIN) tablet Take 1 tablet by mouth daily.    . Omega-3 Fatty Acids (OMEGA-3 FISH OIL PO) Take by mouth daily.    . Sodium Fluoride 0.15 % PSTE Use as directed in the mouth or throat.    Marland Kitchen acyclovir (ZOVIRAX) 400 MG tablet Take 1 tablet (400 mg total) by mouth 2 (two) times daily. 60 tablet 3  . dexamethasone (DECADRON) 4 MG tablet Take 10 tablets (40 mg) weekly 30 tablet 3  . Iron-FA-B Cmp-C-Biot-Probiotic (FUSION PLUS) CAPS TK 1 C  PO QD  2  . ondansetron (ZOFRAN) 8 MG tablet Take 1 tablet (8 mg total) by mouth 2 (two) times daily as needed (Nausea or vomiting). 30 tablet 1  . prochlorperazine (COMPAZINE) 10 MG tablet Take 1 tablet (10 mg total) by mouth every 6 (six) hours as needed (Nausea or vomiting). 30 tablet 1   No current facility-administered medications for this visit.     REVIEW OF SYSTEMS:   Constitutional: Denies fevers, chills or abnormal night sweats Eyes: Denies blurriness of vision, double vision or watery eyes Ears, nose, mouth, throat, and face: Denies mucositis or sore throat Respiratory: Denies cough, dyspnea or wheezes Cardiovascular: Denies palpitation, chest discomfort or lower extremity swelling Gastrointestinal:  Denies nausea, heartburn or change  in bowel habits Skin: Denies abnormal skin rashes Lymphatics: Denies new lymphadenopathy or easy bruising Neurological:Denies numbness, tingling or new weaknesses Behavioral/Psych: Mood is stable, no new changes  All other systems were reviewed with the patient and are negative.  PHYSICAL EXAMINATION: ECOG PERFORMANCE STATUS: 1 - Symptomatic but completely ambulatory  Vitals:   04/19/16 1051  BP: 137/66  Pulse: (!) 55  Resp: 18  Temp: 98.2 F (36.8 C)   Filed Weights   04/19/16 1051  Weight: 285 lb 6.4 oz (129.5 kg)    GENERAL:alert, no distress and comfortable SKIN: skin color, texture, turgor are normal, no rashes or significant lesions EYES: normal, conjunctiva are pink and non-injected, sclera clear PSYCH: alert & oriented x 3 with fluent speech NEURO: no focal motor/sensory deficits  LABORATORY DATA:  I have reviewed the data as listed Lab Results  Component Value Date   WBC 3.2 (L) 04/19/2016   HGB 10.3 (L) 04/19/2016   HCT 31.8 (L) 04/19/2016   MCV 94.4 04/19/2016   PLT 228 04/19/2016    Recent Labs  04/05/16 1045 04/05/16 1046 04/19/16 1035  NA  --  137 138  K  --  3.8 3.7  CO2  --  26 25  GLUCOSE  --  97 80  BUN  --  20.1 20.5  CREATININE  --  0.9 0.9  CALCIUM  --  9.7 9.6  PROT 7.8 8.5* 8.4*  ALBUMIN  --  3.3* 3.2*  AST  --  30 22  ALT  --  26 16  ALKPHOS  --  83 85  BILITOT  --  0.40 0.46    RADIOGRAPHIC STUDIES: I have personally reviewed the radiological images as listed and agreed with the findings in the report. Ct Biopsy  Result Date: 03/22/2016 CLINICAL DATA:  Anemia and leukopenia. EXAM: CT GUIDED BONE MARROW ASPIRATION AND BIOPSY ANESTHESIA/SEDATION: Versed 1.0 mg IV, Fentanyl 50 mcg IV Total Moderate Sedation Time:  13 minutes. The patient's level of consciousness and physiologic status were continuously monitored during the procedure by Radiology nursing. PROCEDURE: The procedure risks, benefits, and alternatives were explained to  the patient. Questions regarding the procedure were encouraged and answered. The patient understands and consents to the procedure. A time out was performed prior to initiating the procedure. The right gluteal region was prepped with chlorhexidine. Sterile gown and sterile gloves were used for the procedure. Local anesthesia was provided with 1% Lidocaine. Under CT guidance, an 11 gauge On Control bone cutting needle was advanced from a posterior approach into the right iliac bone. Needle positioning was confirmed with CT. Initial non heparinized and heparinized aspirate samples were obtained of bone marrow. Core biopsy was performed via the On Control drill needle. COMPLICATIONS: None FINDINGS: Inspection  of initial aspirate did reveal visible particles. Intact core biopsy sample was obtained. IMPRESSION: CT guided bone marrow biopsy of right posterior iliac bone with both aspirate and core samples obtained. Electronically Signed   By: Aletta Edouard M.D.   On: 03/22/2016 11:58   Dg Bone Survey Met  Result Date: 04/13/2016 CLINICAL DATA:  Intermittent bilateral shoulder and knee discomfort. History of multiple myeloma. EXAM: METASTATIC BONE SURVEY COMPARISON:  None in PACs FINDINGS: Calvarium: There is an approximately 7 mm diameter lucency in the anterior aspect of the will frontal bone. The less well defined lucency along the high frontoparietal junction is observed as well. Subtle smaller lucencies are noted elsewhere. Spine: The vertebral bodies are preserved in height. There is no lytic nor blastic bony lesion. There is mild multilevel degenerative disc space narrowing in the thoracic and lumbar spine. There is no spondylolisthesis. Chest: The lungs are adequately inflated and clear. The heart and pulmonary vascularity are normal. There is tortuosity of the descending thoracic aorta. Pectoral girdle: The clavicles are intact. There degenerative changes of the left AC joint. No lytic nor blastic bony  lesions are observed. Pelvis: No lytic nor blastic bony lesions are observed. The hip joint spaces are reasonably well-maintained. There is moderate osteoarthritic joint space narrowing of the medial and lateral knee joint compartments. Faint meniscal calcifications are observed. No lytic or blastic lesions are observed in the lower extremities. IMPRESSION: 1. There are lucencies within the calvarium worrisome for myeloma. No definite abnormal lytic or blastic lesions are observed elsewhere. 2. Degenerative changes of the left shoulder centered on the [facility] joint. Moderate degenerative change of both knees. Electronically Signed   By: David  Martinique M.D.   On: 04/13/2016 08:39    ASSESSMENT & PLAN:  Multiple myeloma ([place]) We discussed the role of chemotherapy. The intent is for palliative.  We discussed some of the risks, benefits, side-effects of Revlimid, Velcade & Dexamethasone.  Some of the short term side-effects included, though not limited to, including risk of fatigue, weight gain, high blood sugar, high blood pressure, neuropathy, stomach ulcers, pancytopenia, allergic reactions, life-threatening infections, need for transfusions of blood products, nausea, vomiting, change in bowel habits, blood clots, admission to hospital for various reasons, and risks of death.   Long term side-effects are also discussed including risks of infertility, permanent damage to nerve function, chronic fatigue, and rare secondary malignancy including bone marrow disorders and leukemia.   The patient is aware that the response rates discussed earlier is not guaranteed.  After a long discussion, patient made an informed decision to proceed with the prescribed plan of care The patient will obtain dental clearance from her dentist. She will start pulse dexamethasone on Fridays. We discussed vitamin D supplement, aspirin for DVT prophylaxis and acyclovir for antimicrobial prophylaxis We also discussed anti-emetics use  as needed I will see her back next week for toxicity review We will discuss transplant evaluation/referral in our next visit  Pancytopenia, acquired ([place]) The pancytopenia is likely due to untreated bone marrow disease. She is not symptomatic. We will monitor blood counts carefully.   No orders of the defined types were placed in this encounter.   All questions were answered. The patient knows to call the clinic with any problems, questions or concerns. I spent 30 minutes counseling the patient face to face. The total time spent in the appointment was 55 minutes and more than 50% was on counseling.     Heath Lark, MD 04/19/2016 1:35 PM

## 2016-04-19 NOTE — Telephone Encounter (Signed)
Appointments scheduled per 04/19/16 los. A copy of the AVS report and appointment schedule was given to the patient, per 04/19/16 los. °

## 2016-04-19 NOTE — Assessment & Plan Note (Signed)
We discussed the role of chemotherapy. The intent is for palliative.  We discussed some of the risks, benefits, side-effects of Revlimid, Velcade & Dexamethasone.  Some of the short term side-effects included, though not limited to, including risk of fatigue, weight gain, high blood sugar, high blood pressure, neuropathy, stomach ulcers, pancytopenia, allergic reactions, life-threatening infections, need for transfusions of blood products, nausea, vomiting, change in bowel habits, blood clots, admission to hospital for various reasons, and risks of death.   Long term side-effects are also discussed including risks of infertility, permanent damage to nerve function, chronic fatigue, and rare secondary malignancy including bone marrow disorders and leukemia.   The patient is aware that the response rates discussed earlier is not guaranteed.  After a long discussion, patient made an informed decision to proceed with the prescribed plan of care The patient will obtain dental clearance from her dentist. She will start pulse dexamethasone on Fridays. We discussed vitamin D supplement, aspirin for DVT prophylaxis and acyclovir for antimicrobial prophylaxis We also discussed anti-emetics use as needed I will see her back next week for toxicity review We will discuss transplant evaluation/referral in our next visit

## 2016-04-20 LAB — KAPPA/LAMBDA LIGHT CHAINS
Ig Kappa Free Light Chain: 83.1 mg/L — ABNORMAL HIGH (ref 3.3–19.4)
Ig Lambda Free Light Chain: 8.6 mg/L (ref 5.7–26.3)
Kappa/Lambda FluidC Ratio: 9.66 — ABNORMAL HIGH (ref 0.26–1.65)

## 2016-04-22 ENCOUNTER — Other Ambulatory Visit: Payer: Self-pay | Admitting: Hematology and Oncology

## 2016-04-22 ENCOUNTER — Other Ambulatory Visit: Payer: BC Managed Care – PPO

## 2016-04-22 ENCOUNTER — Ambulatory Visit (HOSPITAL_BASED_OUTPATIENT_CLINIC_OR_DEPARTMENT_OTHER): Payer: BC Managed Care – PPO

## 2016-04-22 VITALS — BP 132/79 | HR 99 | Temp 98.1°F | Resp 16

## 2016-04-22 DIAGNOSIS — C9000 Multiple myeloma not having achieved remission: Secondary | ICD-10-CM

## 2016-04-22 DIAGNOSIS — Z5112 Encounter for antineoplastic immunotherapy: Secondary | ICD-10-CM | POA: Diagnosis not present

## 2016-04-22 LAB — MULTIPLE MYELOMA PANEL, SERUM
Albumin SerPl Elph-Mcnc: 3.6 g/dL (ref 2.9–4.4)
Albumin/Glob SerPl: 0.9 (ref 0.7–1.7)
Alpha 1: 0.2 g/dL (ref 0.0–0.4)
Alpha2 Glob SerPl Elph-Mcnc: 0.8 g/dL (ref 0.4–1.0)
B-Globulin SerPl Elph-Mcnc: 1.1 g/dL (ref 0.7–1.3)
Gamma Glob SerPl Elph-Mcnc: 2.1 g/dL — ABNORMAL HIGH (ref 0.4–1.8)
Globulin, Total: 4.3 g/dL — ABNORMAL HIGH (ref 2.2–3.9)
IgA, Qn, Serum: 58 mg/dL — ABNORMAL LOW (ref 87–352)
IgG, Qn, Serum: 2645 mg/dL — ABNORMAL HIGH (ref 700–1600)
IgM, Qn, Serum: 23 mg/dL — ABNORMAL LOW (ref 26–217)
M Protein SerPl Elph-Mcnc: 2 g/dL — ABNORMAL HIGH
Total Protein: 7.9 g/dL (ref 6.0–8.5)

## 2016-04-22 MED ORDER — BORTEZOMIB CHEMO SQ INJECTION 3.5 MG (2.5MG/ML)
1.3000 mg/m2 | Freq: Once | INTRAMUSCULAR | Status: AC
  Administered 2016-04-22: 3.25 mg via SUBCUTANEOUS
  Filled 2016-04-22: qty 3.25

## 2016-04-22 MED ORDER — PROCHLORPERAZINE MALEATE 10 MG PO TABS
10.0000 mg | ORAL_TABLET | Freq: Once | ORAL | Status: AC
  Administered 2016-04-22: 10 mg via ORAL

## 2016-04-22 MED ORDER — PROCHLORPERAZINE MALEATE 10 MG PO TABS
ORAL_TABLET | ORAL | Status: AC
  Filled 2016-04-22: qty 1

## 2016-04-22 NOTE — Patient Instructions (Signed)
[facility] Discharge Instructions for Patients Receiving Chemotherapy  Today you received the following chemotherapy agents Velcade. To help prevent nausea and vomiting after your treatment, we encourage you to take your nausea medication as directed.  If you develop nausea and vomiting that is not controlled by your nausea medication, call the clinic.   BELOW ARE SYMPTOMS THAT SHOULD BE REPORTED IMMEDIATELY:  *FEVER GREATER THAN 100.5 F  *CHILLS WITH OR WITHOUT FEVER  NAUSEA AND VOMITING THAT IS NOT CONTROLLED WITH YOUR NAUSEA MEDICATION  *UNUSUAL SHORTNESS OF BREATH  *UNUSUAL BRUISING OR BLEEDING  TENDERNESS IN MOUTH AND THROAT WITH OR WITHOUT PRESENCE OF ULCERS  *URINARY PROBLEMS  *BOWEL PROBLEMS  UNUSUAL RASH Items with * indicate a potential emergency and should be followed up as soon as possible.  Feel free to call the clinic you have any questions or concerns. The clinic phone number is [phone].  Please show the CHEMO ALERT CARD at check-in to the Emergency Department and triage nurse.    

## 2016-04-22 NOTE — Telephone Encounter (Signed)
PLs refill electronically

## 2016-04-23 ENCOUNTER — Other Ambulatory Visit: Payer: Self-pay | Admitting: *Deleted

## 2016-04-26 ENCOUNTER — Ambulatory Visit (HOSPITAL_BASED_OUTPATIENT_CLINIC_OR_DEPARTMENT_OTHER): Payer: BC Managed Care – PPO

## 2016-04-26 ENCOUNTER — Other Ambulatory Visit: Payer: Self-pay | Admitting: Hematology and Oncology

## 2016-04-26 ENCOUNTER — Other Ambulatory Visit (HOSPITAL_BASED_OUTPATIENT_CLINIC_OR_DEPARTMENT_OTHER): Payer: BC Managed Care – PPO

## 2016-04-26 ENCOUNTER — Telehealth: Payer: Self-pay | Admitting: Hematology and Oncology

## 2016-04-26 ENCOUNTER — Ambulatory Visit (HOSPITAL_BASED_OUTPATIENT_CLINIC_OR_DEPARTMENT_OTHER): Payer: BC Managed Care – PPO | Admitting: Hematology and Oncology

## 2016-04-26 DIAGNOSIS — C9000 Multiple myeloma not having achieved remission: Secondary | ICD-10-CM | POA: Diagnosis not present

## 2016-04-26 DIAGNOSIS — Z23 Encounter for immunization: Secondary | ICD-10-CM

## 2016-04-26 DIAGNOSIS — D61818 Other pancytopenia: Secondary | ICD-10-CM | POA: Diagnosis not present

## 2016-04-26 DIAGNOSIS — Z5112 Encounter for antineoplastic immunotherapy: Secondary | ICD-10-CM

## 2016-04-26 LAB — COMPREHENSIVE METABOLIC PANEL
ALT: 19 U/L (ref 0–55)
AST: 21 U/L (ref 5–34)
Albumin: 3.2 g/dL — ABNORMAL LOW (ref 3.5–5.0)
Alkaline Phosphatase: 73 U/L (ref 40–150)
Anion Gap: 7 mEq/L (ref 3–11)
BUN: 18.7 mg/dL (ref 7.0–26.0)
CO2: 29 mEq/L (ref 22–29)
Calcium: 9.1 mg/dL (ref 8.4–10.4)
Chloride: 101 mEq/L (ref 98–109)
Creatinine: 0.9 mg/dL (ref 0.6–1.1)
EGFR: 80 mL/min/{1.73_m2} — ABNORMAL LOW (ref >90)
Glucose: 88 mg/dl (ref 70–140)
Potassium: 3.7 mEq/L (ref 3.5–5.1)
Sodium: 137 mEq/L (ref 136–145)
Total Bilirubin: 0.61 mg/dL (ref 0.20–1.20)
Total Protein: 7.8 g/dL (ref 6.4–8.3)

## 2016-04-26 LAB — CBC WITH DIFFERENTIAL/PLATELET
BASO%: 0.3 % (ref 0.0–2.0)
Basophils Absolute: 0 10*3/uL (ref 0.0–0.1)
EOS%: 1.9 % (ref 0.0–7.0)
Eosinophils Absolute: 0.1 10*3/uL (ref 0.0–0.5)
HCT: 33.1 % — ABNORMAL LOW (ref 34.8–46.6)
HGB: 10.6 g/dL — ABNORMAL LOW (ref 11.6–15.9)
LYMPH%: 25 % (ref 14.0–49.7)
MCH: 30.3 pg (ref 25.1–34.0)
MCHC: 31.9 g/dL (ref 31.5–36.0)
MCV: 95.2 fL (ref 79.5–101.0)
MONO#: 0.3 10*3/uL (ref 0.1–0.9)
MONO%: 8.9 % (ref 0.0–14.0)
NEUT#: 2.2 10*3/uL (ref 1.5–6.5)
NEUT%: 63.9 % (ref 38.4–76.8)
Platelets: 202 10*3/uL (ref 145–400)
RBC: 3.48 10*6/uL — ABNORMAL LOW (ref 3.70–5.45)
RDW: 16 % — ABNORMAL HIGH (ref 11.2–14.5)
WBC: 3.5 10*3/uL — ABNORMAL LOW (ref 3.9–10.3)
lymph#: 0.9 10*3/uL (ref 0.9–3.3)

## 2016-04-26 MED ORDER — INFLUENZA VAC SPLIT QUAD 0.5 ML IM SUSY
0.5000 mL | PREFILLED_SYRINGE | Freq: Once | INTRAMUSCULAR | Status: AC
  Administered 2016-04-26: 0.5 mL via INTRAMUSCULAR
  Filled 2016-04-26: qty 0.5

## 2016-04-26 MED ORDER — PROCHLORPERAZINE MALEATE 10 MG PO TABS
ORAL_TABLET | ORAL | Status: AC
  Filled 2016-04-26: qty 1

## 2016-04-26 MED ORDER — ZOLEDRONIC ACID 4 MG/100ML IV SOLN
4.0000 mg | Freq: Once | INTRAVENOUS | Status: AC
  Administered 2016-04-26: 4 mg via INTRAVENOUS
  Filled 2016-04-26: qty 100

## 2016-04-26 MED ORDER — SODIUM CHLORIDE 0.9 % IV SOLN
Freq: Once | INTRAVENOUS | Status: AC
  Administered 2016-04-26: 17:00:00 via INTRAVENOUS

## 2016-04-26 MED ORDER — LOSARTAN POTASSIUM-HCTZ 100-25 MG PO TABS: 1.0000 | ORAL_TABLET | Freq: Every day | ORAL | 3 refills | Status: AC

## 2016-04-26 MED ORDER — PROCHLORPERAZINE MALEATE 10 MG PO TABS
10.0000 mg | ORAL_TABLET | Freq: Once | ORAL | Status: AC
  Administered 2016-04-26: 10 mg via ORAL

## 2016-04-26 MED ORDER — BORTEZOMIB CHEMO SQ INJECTION 3.5 MG (2.5MG/ML)
1.3000 mg/m2 | Freq: Once | INTRAMUSCULAR | Status: AC
  Administered 2016-04-26: 3.25 mg via SUBCUTANEOUS
  Filled 2016-04-26: qty 3.25

## 2016-04-26 NOTE — Patient Instructions (Addendum)
[facility] Discharge Instructions for Patients Receiving Chemotherapy  Today you received the following chemotherapy agents :   Velcade.  To help prevent nausea and vomiting after your treatment, we encourage you to take your nausea medication as prescribed.   If you develop nausea and vomiting that is not controlled by your nausea medication, call the clinic.   BELOW ARE SYMPTOMS THAT SHOULD BE REPORTED IMMEDIATELY:  *FEVER GREATER THAN 100.5 F  *CHILLS WITH OR WITHOUT FEVER  NAUSEA AND VOMITING THAT IS NOT CONTROLLED WITH YOUR NAUSEA MEDICATION  *UNUSUAL SHORTNESS OF BREATH  *UNUSUAL BRUISING OR BLEEDING  TENDERNESS IN MOUTH AND THROAT WITH OR WITHOUT PRESENCE OF ULCERS  *URINARY PROBLEMS  *BOWEL PROBLEMS  UNUSUAL RASH Items with * indicate a potential emergency and should be followed up as soon as possible.  Feel free to call the clinic you have any questions or concerns. The clinic phone number is [phone].  Please show the [place] at check-in to the Emergency Department and triage nurse.  Zoledronic Acid injection (Hypercalcemia, Oncology) What is this medicine? ZOLEDRONIC ACID (ZOE le dron ik AS id) lowers the amount of calcium loss from bone. It is used to treat too much calcium in your blood from cancer. It is also used to prevent complications of cancer that has spread to the bone. This medicine may be used for other purposes; ask your health care provider or pharmacist if you have questions. COMMON BRAND NAME(S): Zometa What should I tell my health care provider before I take this medicine? They need to know if you have any of these conditions: -aspirin-sensitive asthma -cancer, especially if you are receiving medicines used to treat cancer -dental disease or wear dentures -infection -kidney disease -receiving corticosteroids like dexamethasone or prednisone -an unusual or allergic reaction to zoledronic acid, other medicines,  foods, dyes, or preservatives -pregnant or trying to get pregnant -breast-feeding How should I use this medicine? This medicine is for infusion into a vein. It is given by a health care professional in a hospital or clinic setting. Talk to your pediatrician regarding the use of this medicine in children. Special care may be needed. Overdosage: If you think you have taken too much of this medicine contact a poison control center or emergency room at once. NOTE: This medicine is only for you. Do not share this medicine with others. What if I miss a dose? It is important not to miss your dose. Call your doctor or health care professional if you are unable to keep an appointment. What may interact with this medicine? -certain antibiotics given by injection -NSAIDs, medicines for pain and inflammation, like ibuprofen or naproxen -some diuretics like bumetanide, furosemide -teriparatide -thalidomide This list may not describe all possible interactions. Give your health care provider a list of all the medicines, herbs, non-prescription drugs, or dietary supplements you use. Also tell them if you smoke, drink alcohol, or use illegal drugs. Some items may interact with your medicine. What should I watch for while using this medicine? Visit your doctor or health care professional for regular checkups. It may be some time before you see the benefit from this medicine. Do not stop taking your medicine unless your doctor tells you to. Your doctor may order blood tests or other tests to see how you are doing. Women should inform their doctor if they wish to become pregnant or think they might be pregnant. There is a potential for serious side effects to an unborn child.  your health care professional or pharmacist for more information. You should make sure that you get enough calcium and vitamin D while you are taking this medicine. Discuss the foods you eat and the vitamins you take with your health care  professional. Some people who take this medicine have severe bone, joint, and/or muscle pain. This medicine may also increase your risk for jaw problems or a broken thigh bone. Tell your doctor right away if you have severe pain in your jaw, bones, joints, or muscles. Tell your doctor if you have any pain that does not go away or that gets worse. Tell your dentist and dental surgeon that you are taking this medicine. You should not have major dental surgery while on this medicine. See your dentist to have a dental exam and fix any dental problems before starting this medicine. Take good care of your teeth while on this medicine. Make sure you see your dentist for regular follow-up appointments. What side effects may I notice from receiving this medicine? Side effects that you should report to your doctor or health care professional as soon as possible: -allergic reactions like skin rash, itching or hives, swelling of the face, lips, or tongue -anxiety, confusion, or depression -breathing problems -changes in vision -eye pain -feeling faint or lightheaded, falls -jaw pain, especially after dental work -mouth sores -muscle cramps, stiffness, or weakness -redness, blistering, peeling or loosening of the skin, including inside the mouth -trouble passing urine or change in the amount of urine Side effects that usually do not require medical attention (report to your doctor or health care professional if they continue or are bothersome): -bone, joint, or muscle pain -constipation -diarrhea -fever -hair loss -irritation at site where injected -loss of appetite -nausea, vomiting -stomach upset -trouble sleeping -trouble swallowing -weak or tired This list may not describe all possible side effects. Call your doctor for medical advice about side effects. You may report side effects to FDA at 1-800-FDA-1088. Where should I keep my medicine? This drug is given in a hospital or clinic and will not  be stored at home. NOTE: This sheet is a summary. It may not cover all possible information. If you have questions about this medicine, talk to your doctor, pharmacist, or health care provider.  2017 Elsevier/Gold Standard (2013-09-29 14:19:39)    

## 2016-04-26 NOTE — Assessment & Plan Note (Signed)
She tolerated treatment well without major side effects. I plan to reorder myeloma panel at the end of the month and see her back at the end of cycle 2 to assess response to treatment. She will continue aspirin for DVT prophylaxis, acyclovir for antimicrobial prophylaxis along with calcium and vitamin D. She will receive Zometa every 3 months.

## 2016-04-26 NOTE — Telephone Encounter (Signed)
Appointments scheduled per 12/11 LOS. Patient given AVS report and calendars with future scheduled appointments. °

## 2016-04-26 NOTE — Progress Notes (Signed)
[facility] OFF[facility] PROGRESS NOTE  Patient Care Team: Lucianne Lei, MD as PCP - General (Family Medicine)  SUMMARY OF ONCOLOGIC HISTORY:   Multiple myeloma ([place])   03/22/2016 Bone Marrow Biopsy    Bone Marrow Biopsy: Plasma cells 17% by Aspirate and 30% by CD 138 stain. Plasma cell neoplasm, Kappa Restricted Normal cytogenetics, FISH positive for +11 and +14 and +7      04/13/2016 Imaging    Skeletal survey showed lucencies within the calvarium worrisome for myeloma. No definite abnormal lytic or blastic lesions are observed elsewhere.      04/19/2016 -  Chemotherapy    The patient consented to treatment with Revlimid, dexamethasone and Velcade       INTERVAL HISTORY: Please see below for problem oriented charting. She is seen on week 2 of treatment to assess side effects of treatment. She tolerated treatment well. Denies neuropathy. No new bone pain. The patient denies any recent signs or symptoms of bleeding such as spontaneous epistaxis, hematuria or hematochezia.   REVIEW OF SYSTEMS:   Constitutional: Denies fevers, chills or abnormal weight loss Eyes: Denies blurriness of vision Ears, nose, mouth, throat, and face: Denies mucositis or sore throat Respiratory: Denies cough, dyspnea or wheezes Cardiovascular: Denies palpitation, chest discomfort or lower extremity swelling Gastrointestinal:  Denies nausea, heartburn or change in bowel habits Skin: Denies abnormal skin rashes Lymphatics: Denies new lymphadenopathy or easy bruising Neurological:Denies numbness, tingling or new weaknesses Behavioral/Psych: Mood is stable, no new changes  All other systems were reviewed with the patient and are negative.  I have reviewed the past medical history, past surgical history, social history and family history with the patient and they are unchanged from previous note.  ALLERGIES:  has No Known Allergies.  MEDICATIONS:  Current Outpatient Prescriptions  Medication  Sig Dispense Refill  . acyclovir (ZOVIRAX) 400 MG tablet Take 1 tablet (400 mg total) by mouth 2 (two) times daily. 60 tablet 3  . ASPIRIN 81 PO Take 81 mg by mouth daily.    . Cholecalciferol (VITAMIN D3 PO) Take 5,000 Int'l Units by mouth daily.    . Cyanocobalamin (VITAMIN B-12 PO) Take 5,000 mcg by mouth 2 (two) times daily.    Marland Kitchen dexamethasone (DECADRON) 4 MG tablet Take 10 tablets (40 mg) weekly 30 tablet 3  . FIBER PO Take by mouth daily.    . Iron-FA-B Cmp-C-Biot-Probiotic (FUSION PLUS) CAPS TK 1 C PO QD  2  . losartan-hydrochlorothiazide (HYZAAR) 100-25 MG tablet Take 1 tablet by mouth daily. 90 tablet 3  . Multiple Vitamin (MULTIVITAMIN) tablet Take 1 tablet by mouth daily.    . Omega-3 Fatty Acids (OMEGA-3 FISH OIL PO) Take by mouth daily.    . ondansetron (ZOFRAN) 8 MG tablet Take 1 tablet (8 mg total) by mouth 2 (two) times daily as needed (Nausea or vomiting). 30 tablet 1  . prochlorperazine (COMPAZINE) 10 MG tablet Take 1 tablet (10 mg total) by mouth every 6 (six) hours as needed (Nausea or vomiting). 30 tablet 1  . REVLIMID 10 MG capsule TAKE 1 CAPSULE (10MG) BY MOUTH ONCE DAILY FOR 14 DAYS, REST 7 DAYS. 14 capsule 0  . Sodium Fluoride 0.15 % PSTE Use as directed in the mouth or throat.     No current facility-administered medications for this visit.     PHYSICAL EXAMINATION: ECOG PERFORMANCE STATUS: 0 - Asymptomatic  Vitals:   04/26/16 1523  BP: 113/69  Pulse: 68  Resp: 18  Temp: 97.9 F (  36.6 C)   Filed Weights   04/26/16 1523  Weight: 285 lb 3.2 oz (129.4 kg)    GENERAL:alert, no distress and comfortable SKIN: skin color, texture, turgor are normal, no rashes or significant lesions EYES: normal, Conjunctiva are pink and non-injected, sclera clear OROPHARYNX:no exudate, no erythema and lips, buccal mucosa, and tongue normal  NECK: supple, thyroid normal size, non-tender, without nodularity LYMPH:  no palpable lymphadenopathy in the cervical, axillary or  inguinal LUNGS: clear to auscultation and percussion with normal breathing effort HEART: regular rate & rhythm and no murmurs and no lower extremity edema ABDOMEN:abdomen soft, non-tender and normal bowel sounds Musculoskeletal:no cyanosis of digits and no clubbing  NEURO: alert & oriented x 3 with fluent speech, no focal motor/sensory deficits  LABORATORY DATA:  I have reviewed the data as listed    Component Value Date/Time   NA 137 04/26/2016 1509   K 3.7 04/26/2016 1509   CO2 29 04/26/2016 1509   GLUCOSE 88 04/26/2016 1509   BUN 18.7 04/26/2016 1509   CREATININE 0.9 04/26/2016 1509   CALCIUM 9.1 04/26/2016 1509   PROT 7.8 04/26/2016 1509   ALBUMIN 3.2 (L) 04/26/2016 1509   AST 21 04/26/2016 1509   ALT 19 04/26/2016 1509   ALKPHOS 73 04/26/2016 1509   BILITOT 0.61 04/26/2016 1509    No results found for: SPEP, UPEP  Lab Results  Component Value Date   WBC 3.5 (L) 04/26/2016   NEUTROABS 2.2 04/26/2016   HGB 10.6 (L) 04/26/2016   HCT 33.1 (L) 04/26/2016   MCV 95.2 04/26/2016   PLT 202 04/26/2016      Chemistry      Component Value Date/Time   NA 137 04/26/2016 1509   K 3.7 04/26/2016 1509   CO2 29 04/26/2016 1509   BUN 18.7 04/26/2016 1509   CREATININE 0.9 04/26/2016 1509      Component Value Date/Time   CALCIUM 9.1 04/26/2016 1509   ALKPHOS 73 04/26/2016 1509   AST 21 04/26/2016 1509   ALT 19 04/26/2016 1509   BILITOT 0.61 04/26/2016 1509       RADIOGRAPHIC STUDIES: I have personally reviewed the radiological images as listed and agreed with the findings in the report. Dg Bone Survey Met  Result Date: 04/13/2016 CLINICAL DATA:  Intermittent bilateral shoulder and knee discomfort. History of multiple myeloma. EXAM: METASTATIC BONE SURVEY COMPARISON:  None in PACs FINDINGS: Calvarium: There is an approximately 7 mm diameter lucency in the anterior aspect of the will frontal bone. The less well defined lucency along the high frontoparietal junction is  observed as well. Subtle smaller lucencies are noted elsewhere. Spine: The vertebral bodies are preserved in height. There is no lytic nor blastic bony lesion. There is mild multilevel degenerative disc space narrowing in the thoracic and lumbar spine. There is no spondylolisthesis. Chest: The lungs are adequately inflated and clear. The heart and pulmonary vascularity are normal. There is tortuosity of the descending thoracic aorta. Pectoral girdle: The clavicles are intact. There degenerative changes of the left AC joint. No lytic nor blastic bony lesions are observed. Pelvis: No lytic nor blastic bony lesions are observed. The hip joint spaces are reasonably well-maintained. There is moderate osteoarthritic joint space narrowing of the medial and lateral knee joint compartments. Faint meniscal calcifications are observed. No lytic or blastic lesions are observed in the lower extremities. IMPRESSION: 1. There are lucencies within the calvarium worrisome for myeloma. No definite abnormal lytic or blastic lesions are observed elsewhere. 2.  Degenerative changes of the left shoulder centered on the [place] joint. Moderate degenerative change of both knees. Electronically Signed   By: David  Martinique M.D.   On: 04/13/2016 08:39    ASSESSMENT & PLAN:  Multiple myeloma ([place]) She tolerated treatment well without major side effects. I plan to reorder myeloma panel at the end of the month and see her back at the end of cycle 2 to assess response to treatment. She will continue aspirin for DVT prophylaxis, acyclovir for antimicrobial prophylaxis along with calcium and vitamin D. She will receive Zometa every 3 months.   Pancytopenia, acquired ([place]) The pancytopenia is likely due to untreated bone marrow disease. She is not symptomatic. We will monitor blood counts carefully.   No orders of the defined types were placed in this encounter.  All questions were answered. The patient knows to call the clinic with any  problems, questions or concerns. No barriers to learning was detected. I spent 15 minutes counseling the patient face to face. The total time spent in the appointment was 20 minutes and more than 50% was on counseling and review of test results     Heath Lark, MD 04/27/2016 8:23 AM

## 2016-04-26 NOTE — Assessment & Plan Note (Signed)
The pancytopenia is likely due to untreated bone marrow disease. She is not symptomatic. We will monitor blood counts carefully.

## 2016-04-27 ENCOUNTER — Encounter: Payer: Self-pay | Admitting: Hematology and Oncology

## 2016-04-28 ENCOUNTER — Telehealth: Payer: Self-pay | Admitting: *Deleted

## 2016-04-28 NOTE — Telephone Encounter (Signed)
Per LOS I have scheduled appts and notified the scheduler 

## 2016-04-29 ENCOUNTER — Ambulatory Visit (HOSPITAL_BASED_OUTPATIENT_CLINIC_OR_DEPARTMENT_OTHER): Payer: BC Managed Care – PPO

## 2016-04-29 VITALS — BP 103/67 | HR 68 | Temp 98.0°F | Resp 18

## 2016-04-29 DIAGNOSIS — Z5112 Encounter for antineoplastic immunotherapy: Secondary | ICD-10-CM | POA: Diagnosis not present

## 2016-04-29 DIAGNOSIS — C9000 Multiple myeloma not having achieved remission: Secondary | ICD-10-CM

## 2016-04-29 MED ORDER — BORTEZOMIB CHEMO SQ INJECTION 3.5 MG (2.5MG/ML)
1.3000 mg/m2 | Freq: Once | INTRAMUSCULAR | Status: AC
  Administered 2016-04-29: 3.25 mg via SUBCUTANEOUS
  Filled 2016-04-29: qty 3.25

## 2016-04-29 MED ORDER — PROCHLORPERAZINE MALEATE 10 MG PO TABS
ORAL_TABLET | ORAL | Status: AC
  Filled 2016-04-29: qty 1

## 2016-04-29 MED ORDER — PROCHLORPERAZINE MALEATE 10 MG PO TABS
10.0000 mg | ORAL_TABLET | Freq: Once | ORAL | Status: AC
  Administered 2016-04-29: 10 mg via ORAL

## 2016-04-29 NOTE — Patient Instructions (Signed)
[facility] Discharge Instructions for Patients Receiving Chemotherapy  Today you received the following chemotherapy agents:  Velcade  To help prevent nausea and vomiting after your treatment, we encourage you to take your nausea medication as prescribed.   If you develop nausea and vomiting that is not controlled by your nausea medication, call the clinic.   BELOW ARE SYMPTOMS THAT SHOULD BE REPORTED IMMEDIATELY:  *FEVER GREATER THAN 100.5 F  *CHILLS WITH OR WITHOUT FEVER  NAUSEA AND VOMITING THAT IS NOT CONTROLLED WITH YOUR NAUSEA MEDICATION  *UNUSUAL SHORTNESS OF BREATH  *UNUSUAL BRUISING OR BLEEDING  TENDERNESS IN MOUTH AND THROAT WITH OR WITHOUT PRESENCE OF ULCERS  *URINARY PROBLEMS  *BOWEL PROBLEMS  UNUSUAL RASH Items with * indicate a potential emergency and should be followed up as soon as possible.  Feel free to call the clinic you have any questions or concerns. The clinic phone number is [phone].  Please show the CHEMO ALERT CARD at check-in to the Emergency Department and triage nurse.   

## 2016-05-11 ENCOUNTER — Ambulatory Visit (HOSPITAL_BASED_OUTPATIENT_CLINIC_OR_DEPARTMENT_OTHER): Payer: BC Managed Care – PPO

## 2016-05-11 ENCOUNTER — Other Ambulatory Visit: Payer: Self-pay | Admitting: Hematology and Oncology

## 2016-05-11 ENCOUNTER — Other Ambulatory Visit (HOSPITAL_BASED_OUTPATIENT_CLINIC_OR_DEPARTMENT_OTHER): Payer: BC Managed Care – PPO

## 2016-05-11 VITALS — BP 103/62 | HR 69 | Temp 98.0°F | Resp 20

## 2016-05-11 DIAGNOSIS — C9000 Multiple myeloma not having achieved remission: Secondary | ICD-10-CM

## 2016-05-11 DIAGNOSIS — Z5112 Encounter for antineoplastic immunotherapy: Secondary | ICD-10-CM | POA: Diagnosis not present

## 2016-05-11 LAB — CBC WITH DIFFERENTIAL/PLATELET
BASO%: 0.3 % (ref 0.0–2.0)
Basophils Absolute: 0 10*3/uL (ref 0.0–0.1)
EOS%: 1.5 % (ref 0.0–7.0)
Eosinophils Absolute: 0.1 10*3/uL (ref 0.0–0.5)
HCT: 29.5 % — ABNORMAL LOW (ref 34.8–46.6)
HGB: 9.8 g/dL — ABNORMAL LOW (ref 11.6–15.9)
LYMPH%: 23.3 % (ref 14.0–49.7)
MCH: 31.6 pg (ref 25.1–34.0)
MCHC: 33.2 g/dL (ref 31.5–36.0)
MCV: 95.3 fL (ref 79.5–101.0)
MONO#: 0.5 10*3/uL (ref 0.1–0.9)
MONO%: 10.3 % (ref 0.0–14.0)
NEUT#: 3.1 10*3/uL (ref 1.5–6.5)
NEUT%: 64.6 % (ref 38.4–76.8)
Platelets: 306 10*3/uL (ref 145–400)
RBC: 3.1 10*6/uL — ABNORMAL LOW (ref 3.70–5.45)
RDW: 15.7 % — ABNORMAL HIGH (ref 11.2–14.5)
WBC: 4.9 10*3/uL (ref 3.9–10.3)
lymph#: 1.1 10*3/uL (ref 0.9–3.3)

## 2016-05-11 LAB — COMPREHENSIVE METABOLIC PANEL
ALT: 31 U/L (ref 0–55)
AST: 24 U/L (ref 5–34)
Albumin: 3.2 g/dL — ABNORMAL LOW (ref 3.5–5.0)
Alkaline Phosphatase: 79 U/L (ref 40–150)
Anion Gap: 7 mEq/L (ref 3–11)
BUN: 27.7 mg/dL — ABNORMAL HIGH (ref 7.0–26.0)
CO2: 28 mEq/L (ref 22–29)
Calcium: 9.3 mg/dL (ref 8.4–10.4)
Chloride: 103 mEq/L (ref 98–109)
Creatinine: 1.1 mg/dL (ref 0.6–1.1)
EGFR: 59 mL/min/{1.73_m2} — ABNORMAL LOW (ref >90)
Glucose: 77 mg/dl (ref 70–140)
Potassium: 3.7 mEq/L (ref 3.5–5.1)
Sodium: 138 mEq/L (ref 136–145)
Total Bilirubin: 0.61 mg/dL (ref 0.20–1.20)
Total Protein: 7.2 g/dL (ref 6.4–8.3)

## 2016-05-11 MED ORDER — PROCHLORPERAZINE MALEATE 10 MG PO TABS
ORAL_TABLET | ORAL | Status: AC
  Filled 2016-05-11: qty 1

## 2016-05-11 MED ORDER — PROCHLORPERAZINE MALEATE 10 MG PO TABS
10.0000 mg | ORAL_TABLET | Freq: Once | ORAL | Status: AC
  Administered 2016-05-11: 10 mg via ORAL

## 2016-05-11 MED ORDER — BORTEZOMIB CHEMO SQ INJECTION 3.5 MG (2.5MG/ML)
1.3000 mg/m2 | Freq: Once | INTRAMUSCULAR | Status: AC
  Administered 2016-05-11: 3.25 mg via SUBCUTANEOUS
  Filled 2016-05-11: qty 3.25

## 2016-05-11 NOTE — Patient Instructions (Signed)
[facility] Discharge Instructions for Patients Receiving Chemotherapy  Today you received the following chemotherapy agents Velcade. To help prevent nausea and vomiting after your treatment, we encourage you to take your nausea medication as directed.  If you develop nausea and vomiting that is not controlled by your nausea medication, call the clinic.   BELOW ARE SYMPTOMS THAT SHOULD BE REPORTED IMMEDIATELY:  *FEVER GREATER THAN 100.5 F  *CHILLS WITH OR WITHOUT FEVER  NAUSEA AND VOMITING THAT IS NOT CONTROLLED WITH YOUR NAUSEA MEDICATION  *UNUSUAL SHORTNESS OF BREATH  *UNUSUAL BRUISING OR BLEEDING  TENDERNESS IN MOUTH AND THROAT WITH OR WITHOUT PRESENCE OF ULCERS  *URINARY PROBLEMS  *BOWEL PROBLEMS  UNUSUAL RASH Items with * indicate a potential emergency and should be followed up as soon as possible.  Feel free to call the clinic you have any questions or concerns. The clinic phone number is [phone].  Please show the CHEMO ALERT CARD at check-in to the Emergency Department and triage nurse.    

## 2016-05-11 NOTE — Telephone Encounter (Signed)
Please refill electronically 

## 2016-05-12 LAB — KAPPA/LAMBDA LIGHT CHAINS
Ig Kappa Free Light Chain: 42.7 mg/L — ABNORMAL HIGH (ref 3.3–19.4)
Ig Lambda Free Light Chain: 10.5 mg/L (ref 5.7–26.3)
Kappa/Lambda FluidC Ratio: 4.07 — ABNORMAL HIGH (ref 0.26–1.65)

## 2016-05-13 LAB — MULTIPLE MYELOMA PANEL, SERUM
Albumin SerPl Elph-Mcnc: 3.2 g/dL (ref 2.9–4.4)
Albumin/Glob SerPl: 1 (ref 0.7–1.7)
Alpha 1: 0.2 g/dL (ref 0.0–0.4)
Alpha2 Glob SerPl Elph-Mcnc: 0.9 g/dL (ref 0.4–1.0)
B-Globulin SerPl Elph-Mcnc: 0.9 g/dL (ref 0.7–1.3)
Gamma Glob SerPl Elph-Mcnc: 1.3 g/dL (ref 0.4–1.8)
Globulin, Total: 3.3 g/dL (ref 2.2–3.9)
IgA, Qn, Serum: 54 mg/dL — ABNORMAL LOW (ref 87–352)
IgG, Qn, Serum: 1354 mg/dL (ref 700–1600)
IgM, Qn, Serum: 39 mg/dL (ref 26–217)
M Protein SerPl Elph-Mcnc: 1 g/dL — ABNORMAL HIGH
Total Protein: 6.5 g/dL (ref 6.0–8.5)

## 2016-05-14 ENCOUNTER — Ambulatory Visit (HOSPITAL_BASED_OUTPATIENT_CLINIC_OR_DEPARTMENT_OTHER): Payer: BC Managed Care – PPO

## 2016-05-14 VITALS — BP 117/73 | HR 91 | Temp 97.6°F | Resp 20

## 2016-05-14 DIAGNOSIS — C9000 Multiple myeloma not having achieved remission: Secondary | ICD-10-CM

## 2016-05-14 DIAGNOSIS — Z5112 Encounter for antineoplastic immunotherapy: Secondary | ICD-10-CM

## 2016-05-14 MED ORDER — PROCHLORPERAZINE MALEATE 10 MG PO TABS
ORAL_TABLET | ORAL | Status: AC
  Filled 2016-05-14: qty 1

## 2016-05-14 MED ORDER — PROCHLORPERAZINE MALEATE 10 MG PO TABS
10.0000 mg | ORAL_TABLET | Freq: Once | ORAL | Status: AC
  Administered 2016-05-14: 10 mg via ORAL

## 2016-05-14 MED ORDER — BORTEZOMIB CHEMO SQ INJECTION 3.5 MG (2.5MG/ML)
1.3000 mg/m2 | Freq: Once | INTRAMUSCULAR | Status: AC
  Administered 2016-05-14: 3.25 mg via SUBCUTANEOUS
  Filled 2016-05-14: qty 3.25

## 2016-05-14 NOTE — Patient Instructions (Signed)
[facility] Discharge Instructions for Patients Receiving Chemotherapy  Today you received the following chemotherapy agents Velcade. To help prevent nausea and vomiting after your treatment, we encourage you to take your nausea medication as directed.  If you develop nausea and vomiting that is not controlled by your nausea medication, call the clinic.   BELOW ARE SYMPTOMS THAT SHOULD BE REPORTED IMMEDIATELY:  *FEVER GREATER THAN 100.5 F  *CHILLS WITH OR WITHOUT FEVER  NAUSEA AND VOMITING THAT IS NOT CONTROLLED WITH YOUR NAUSEA MEDICATION  *UNUSUAL SHORTNESS OF BREATH  *UNUSUAL BRUISING OR BLEEDING  TENDERNESS IN MOUTH AND THROAT WITH OR WITHOUT PRESENCE OF ULCERS  *URINARY PROBLEMS  *BOWEL PROBLEMS  UNUSUAL RASH Items with * indicate a potential emergency and should be followed up as soon as possible.  Feel free to call the clinic you have any questions or concerns. The clinic phone number is [phone].  Please show the CHEMO ALERT CARD at check-in to the Emergency Department and triage nurse.    

## 2016-05-18 ENCOUNTER — Other Ambulatory Visit (HOSPITAL_BASED_OUTPATIENT_CLINIC_OR_DEPARTMENT_OTHER): Payer: BC Managed Care – PPO

## 2016-05-18 ENCOUNTER — Ambulatory Visit (HOSPITAL_BASED_OUTPATIENT_CLINIC_OR_DEPARTMENT_OTHER): Payer: BC Managed Care – PPO

## 2016-05-18 ENCOUNTER — Encounter: Payer: Self-pay | Admitting: Hematology and Oncology

## 2016-05-18 VITALS — BP 120/82 | HR 63 | Temp 98.3°F | Resp 20

## 2016-05-18 DIAGNOSIS — C9000 Multiple myeloma not having achieved remission: Secondary | ICD-10-CM

## 2016-05-18 DIAGNOSIS — Z5112 Encounter for antineoplastic immunotherapy: Secondary | ICD-10-CM

## 2016-05-18 LAB — COMPREHENSIVE METABOLIC PANEL
ALT: 30 U/L (ref 0–55)
AST: 20 U/L (ref 5–34)
Albumin: 3.2 g/dL — ABNORMAL LOW (ref 3.5–5.0)
Alkaline Phosphatase: 89 U/L (ref 40–150)
Anion Gap: 8 mEq/L (ref 3–11)
BUN: 17 mg/dL (ref 7.0–26.0)
CO2: 28 mEq/L (ref 22–29)
Calcium: 9.1 mg/dL (ref 8.4–10.4)
Chloride: 102 mEq/L (ref 98–109)
Creatinine: 1.2 mg/dL — ABNORMAL HIGH (ref 0.6–1.1)
EGFR: 56 mL/min/{1.73_m2} — ABNORMAL LOW (ref >90)
Glucose: 89 mg/dl (ref 70–140)
Potassium: 3.7 mEq/L (ref 3.5–5.1)
Sodium: 138 mEq/L (ref 136–145)
Total Bilirubin: 0.47 mg/dL (ref 0.20–1.20)
Total Protein: 7 g/dL (ref 6.4–8.3)

## 2016-05-18 LAB — CBC WITH DIFFERENTIAL/PLATELET
BASO%: 0.2 % (ref 0.0–2.0)
Basophils Absolute: 0 10*3/uL (ref 0.0–0.1)
EOS%: 1.7 % (ref 0.0–7.0)
Eosinophils Absolute: 0.1 10*3/uL (ref 0.0–0.5)
HCT: 30.8 % — ABNORMAL LOW (ref 34.8–46.6)
HGB: 10.1 g/dL — ABNORMAL LOW (ref 11.6–15.9)
LYMPH%: 25.8 % (ref 14.0–49.7)
MCH: 30.8 pg (ref 25.1–34.0)
MCHC: 32.8 g/dL (ref 31.5–36.0)
MCV: 93.9 fL (ref 79.5–101.0)
MONO#: 0.4 10*3/uL (ref 0.1–0.9)
MONO%: 7.4 % (ref 0.0–14.0)
NEUT#: 3.1 10*3/uL (ref 1.5–6.5)
NEUT%: 64.9 % (ref 38.4–76.8)
Platelets: 162 10*3/uL (ref 145–400)
RBC: 3.28 10*6/uL — ABNORMAL LOW (ref 3.70–5.45)
RDW: 15.8 % — ABNORMAL HIGH (ref 11.2–14.5)
WBC: 4.8 10*3/uL (ref 3.9–10.3)
lymph#: 1.3 10*3/uL (ref 0.9–3.3)

## 2016-05-18 MED ORDER — BORTEZOMIB CHEMO SQ INJECTION 3.5 MG (2.5MG/ML)
1.3000 mg/m2 | Freq: Once | INTRAMUSCULAR | Status: AC
  Administered 2016-05-18: 3.25 mg via SUBCUTANEOUS
  Filled 2016-05-18: qty 3.25

## 2016-05-18 MED ORDER — PROCHLORPERAZINE MALEATE 10 MG PO TABS
10.0000 mg | ORAL_TABLET | Freq: Once | ORAL | Status: AC
  Administered 2016-05-18: 10 mg via ORAL

## 2016-05-18 MED ORDER — PROCHLORPERAZINE MALEATE 10 MG PO TABS
ORAL_TABLET | ORAL | Status: AC
  Filled 2016-05-18: qty 1

## 2016-05-19 ENCOUNTER — Other Ambulatory Visit: Payer: Self-pay | Admitting: Hematology and Oncology

## 2016-05-21 ENCOUNTER — Encounter: Payer: Self-pay | Admitting: Hematology and Oncology

## 2016-05-21 ENCOUNTER — Ambulatory Visit (HOSPITAL_BASED_OUTPATIENT_CLINIC_OR_DEPARTMENT_OTHER): Payer: BC Managed Care – PPO

## 2016-05-21 ENCOUNTER — Telehealth: Payer: Self-pay | Admitting: Hematology and Oncology

## 2016-05-21 ENCOUNTER — Ambulatory Visit (HOSPITAL_BASED_OUTPATIENT_CLINIC_OR_DEPARTMENT_OTHER): Payer: BC Managed Care – PPO | Admitting: Hematology and Oncology

## 2016-05-21 ENCOUNTER — Telehealth: Payer: Self-pay | Admitting: *Deleted

## 2016-05-21 VITALS — BP 119/78 | HR 100 | Temp 97.8°F | Resp 17 | Ht 69.0 in | Wt 288.8 lb

## 2016-05-21 DIAGNOSIS — C9000 Multiple myeloma not having achieved remission: Secondary | ICD-10-CM

## 2016-05-21 DIAGNOSIS — D649 Anemia, unspecified: Secondary | ICD-10-CM | POA: Diagnosis not present

## 2016-05-21 DIAGNOSIS — R7989 Other specified abnormal findings of blood chemistry: Secondary | ICD-10-CM | POA: Diagnosis not present

## 2016-05-21 DIAGNOSIS — Z5112 Encounter for antineoplastic immunotherapy: Secondary | ICD-10-CM

## 2016-05-21 DIAGNOSIS — Z599 Problem related to housing and economic circumstances, unspecified: Secondary | ICD-10-CM

## 2016-05-21 DIAGNOSIS — Z598 Other problems related to housing and economic circumstances: Secondary | ICD-10-CM | POA: Diagnosis not present

## 2016-05-21 MED ORDER — PROCHLORPERAZINE MALEATE 10 MG PO TABS
ORAL_TABLET | ORAL | Status: AC
  Filled 2016-05-21: qty 1

## 2016-05-21 MED ORDER — BORTEZOMIB CHEMO SQ INJECTION 3.5 MG (2.5MG/ML)
1.3000 mg/m2 | Freq: Once | INTRAMUSCULAR | Status: AC
  Administered 2016-05-21: 3.25 mg via SUBCUTANEOUS
  Filled 2016-05-21: qty 3.25

## 2016-05-21 MED ORDER — PROCHLORPERAZINE MALEATE 10 MG PO TABS
10.0000 mg | ORAL_TABLET | Freq: Once | ORAL | Status: AC
  Administered 2016-05-21: 10 mg via ORAL

## 2016-05-21 NOTE — Telephone Encounter (Signed)
Message sent to chemo scheduler to add Chemo per 05/21/16 los. Appointments scheduled per 05/21/16 los. Patient was given a copy of the appointment schedule and AVS report, per 05/21/16 los.

## 2016-05-21 NOTE — Patient Instructions (Signed)
[facility] Discharge Instructions for Patients Receiving Chemotherapy  Today you received the following chemotherapy agents Velcade. To help prevent nausea and vomiting after your treatment, we encourage you to take your nausea medication as directed.  If you develop nausea and vomiting that is not controlled by your nausea medication, call the clinic.   BELOW ARE SYMPTOMS THAT SHOULD BE REPORTED IMMEDIATELY:  *FEVER GREATER THAN 100.5 F  *CHILLS WITH OR WITHOUT FEVER  NAUSEA AND VOMITING THAT IS NOT CONTROLLED WITH YOUR NAUSEA MEDICATION  *UNUSUAL SHORTNESS OF BREATH  *UNUSUAL BRUISING OR BLEEDING  TENDERNESS IN MOUTH AND THROAT WITH OR WITHOUT PRESENCE OF ULCERS  *URINARY PROBLEMS  *BOWEL PROBLEMS  UNUSUAL RASH Items with * indicate a potential emergency and should be followed up as soon as possible.  Feel free to call the clinic you have any questions or concerns. The clinic phone number is [phone].  Please show the CHEMO ALERT CARD at check-in to the Emergency Department and triage nurse.    

## 2016-05-21 NOTE — Telephone Encounter (Signed)
Referral called to [facility]- Dr Norma Fredrickson for BMT

## 2016-05-22 ENCOUNTER — Encounter: Payer: Self-pay | Admitting: Hematology and Oncology

## 2016-05-22 DIAGNOSIS — Z598 Other problems related to housing and economic circumstances: Secondary | ICD-10-CM | POA: Insufficient documentation

## 2016-05-22 DIAGNOSIS — R7989 Other specified abnormal findings of blood chemistry: Secondary | ICD-10-CM | POA: Insufficient documentation

## 2016-05-22 DIAGNOSIS — Z599 Problem related to housing and economic circumstances, unspecified: Secondary | ICD-10-CM | POA: Insufficient documentation

## 2016-05-22 NOTE — Assessment & Plan Note (Signed)
Likely due to dehydration. Recommend increase oral fluid intake

## 2016-05-22 NOTE — Assessment & Plan Note (Signed)
She tolerated treatment well without major side effects. Recent myeloma panel already show 50% reduction of M protein, normalization of IgG and reduction of serum light chain I plan to reorder myeloma panel at the end of the month and see her back next month. She will continue aspirin for DVT prophylaxis, acyclovir for antimicrobial prophylaxis along with calcium and vitamin D. She will receive Zometa every 3 months. We discussed referral to [facility] for transplant and she agreed to proceed

## 2016-05-22 NOTE — Progress Notes (Signed)
[place] OFFICE PROGRESS NOTE  Patient Care Team: Lucianne Lei, MD as PCP - General (Family Medicine)  SUMMARY OF ONCOLOGIC HISTORY:   Multiple myeloma ([place])   03/22/2016 Bone Marrow Biopsy    Bone Marrow Biopsy: Plasma cells 17% by Aspirate and 30% by CD 138 stain. Plasma cell neoplasm, Kappa Restricted Normal cytogenetics, FISH positive for +11 and +14 and +7      04/13/2016 Imaging    Skeletal survey showed lucencies within the calvarium worrisome for myeloma. No definite abnormal lytic or blastic lesions are observed elsewhere.      04/19/2016 -  Chemotherapy    The patient consented to treatment with Revlimid, dexamethasone and Velcade       INTERVAL HISTORY: Please see below for problem oriented charting. She returns for follow-up. So far, she tolerated treatment well except for fatigue and skin reaction. She denies worsening bone pain. No recent infection. She has gained significant amount of weight since the start of treatment Denies peripheral neuropathy  REVIEW OF SYSTEMS:   Constitutional: Denies fevers, chills or abnormal weight loss Eyes: Denies blurriness of vision Ears, nose, mouth, throat, and face: Denies mucositis or sore throat Respiratory: Denies cough, dyspnea or wheezes Cardiovascular: Denies palpitation, chest discomfort or lower extremity swelling Gastrointestinal:  Denies nausea, heartburn or change in bowel habits Lymphatics: Denies new lymphadenopathy or easy bruising Neurological:Denies numbness, tingling or new weaknesses Behavioral/Psych: Mood is stable, no new changes  All other systems were reviewed with the patient and are negative.  I have reviewed the past medical history, past surgical history, social history and family history with the patient and they are unchanged from previous note.  ALLERGIES:  has No Known Allergies.  MEDICATIONS:  Current Outpatient Prescriptions  Medication Sig Dispense Refill  . acyclovir  (ZOVIRAX) 400 MG tablet Take 1 tablet (400 mg total) by mouth 2 (two) times daily. 60 tablet 3  . ASPIRIN 81 PO Take 81 mg by mouth daily.    . Cholecalciferol (VITAMIN D3 PO) Take 5,000 Int'l Units by mouth daily.    . Cyanocobalamin (VITAMIN B-12 PO) Take 5,000 mcg by mouth 2 (two) times daily.    Marland Kitchen dexamethasone (DECADRON) 4 MG tablet Take 10 tablets (40 mg) weekly 30 tablet 3  . FIBER PO Take by mouth daily.    . Iron-FA-B Cmp-C-Biot-Probiotic (FUSION PLUS) CAPS TK 1 C PO QD  2  . losartan-hydrochlorothiazide (HYZAAR) 100-25 MG tablet Take 1 tablet by mouth daily. 90 tablet 3  . Multiple Vitamin (MULTIVITAMIN) tablet Take 1 tablet by mouth daily.    . Omega-3 Fatty Acids (OMEGA-3 FISH OIL PO) Take by mouth daily.    . ondansetron (ZOFRAN) 8 MG tablet Take 1 tablet (8 mg total) by mouth 2 (two) times daily as needed (Nausea or vomiting). 30 tablet 1  . prochlorperazine (COMPAZINE) 10 MG tablet Take 1 tablet (10 mg total) by mouth every 6 (six) hours as needed (Nausea or vomiting). 30 tablet 1  . REVLIMID 10 MG capsule TAKE 1 CAPSULE BY MOUTH ONCE DAILY FOR 14 DAYS ON THEN 7 DAYS OFF 14 capsule 0  . Sodium Fluoride 0.15 % PSTE Use as directed in the mouth or throat.     No current facility-administered medications for this visit.     PHYSICAL EXAMINATION: ECOG PERFORMANCE STATUS: 1 - Symptomatic but completely ambulatory  Vitals:   05/21/16 1349  BP: 119/78  Pulse: 100  Resp: 17  Temp: 97.8 F (36.6 C)  Filed Weights   05/21/16 1349  Weight: 288 lb 12.8 oz (131 kg)    GENERAL:alert, no distress and comfortable SKIN: skin color, texture, turgor are normal, no rashes or significant lesions. Noted mild skin discoloration at prior injection sites EYES: normal, Conjunctiva are pink and non-injected, sclera clear OROPHARYNX:no exudate, no erythema and lips, buccal mucosa, and tongue normal  NECK: supple, thyroid normal size, non-tender, without nodularity LYMPH:  no palpable  lymphadenopathy in the cervical, axillary or inguinal LUNGS: clear to auscultation and percussion with normal breathing effort HEART: regular rate & rhythm and no murmurs and no lower extremity edema ABDOMEN:abdomen soft, non-tender and normal bowel sounds Musculoskeletal:no cyanosis of digits and no clubbing  NEURO: alert & oriented x 3 with fluent speech, no focal motor/sensory deficits  LABORATORY DATA:  I have reviewed the data as listed    Component Value Date/Time   NA 138 05/18/2016 1507   K 3.7 05/18/2016 1507   CO2 28 05/18/2016 1507   GLUCOSE 89 05/18/2016 1507   BUN 17.0 05/18/2016 1507   CREATININE 1.2 (H) 05/18/2016 1507   CALCIUM 9.1 05/18/2016 1507   PROT 7.0 05/18/2016 1507   ALBUMIN 3.2 (L) 05/18/2016 1507   AST 20 05/18/2016 1507   ALT 30 05/18/2016 1507   ALKPHOS 89 05/18/2016 1507   BILITOT 0.47 05/18/2016 1507    No results found for: SPEP, UPEP  Lab Results  Component Value Date   WBC 4.8 05/18/2016   NEUTROABS 3.1 05/18/2016   HGB 10.1 (L) 05/18/2016   HCT 30.8 (L) 05/18/2016   MCV 93.9 05/18/2016   PLT 162 05/18/2016      Chemistry      Component Value Date/Time   NA 138 05/18/2016 1507   K 3.7 05/18/2016 1507   CO2 28 05/18/2016 1507   BUN 17.0 05/18/2016 1507   CREATININE 1.2 (H) 05/18/2016 1507      Component Value Date/Time   CALCIUM 9.1 05/18/2016 1507   ALKPHOS 89 05/18/2016 1507   AST 20 05/18/2016 1507   ALT 30 05/18/2016 1507   BILITOT 0.47 05/18/2016 1507      ASSESSMENT & PLAN:  Multiple myeloma ([place]) She tolerated treatment well without major side effects. Recent myeloma panel already show 50% reduction of M protein, normalization of IgG and reduction of serum light chain I plan to reorder myeloma panel at the end of the month and see her back next month. She will continue aspirin for DVT prophylaxis, acyclovir for antimicrobial prophylaxis along with calcium and vitamin D. She will receive Zometa every 3 months. We  discussed referral to [facility] for transplant and she agreed to proceed  Normocytic anemia This is likely anemia of chronic disease. The patient denies recent history of bleeding such as epistaxis, hematuria or hematochezia. She is asymptomatic from the anemia. We will observe for now.  She does not require transfusion now.    Elevated serum creatinine Likely due to dehydration. Recommend increase oral fluid intake  Financial difficulty She is encountering significant financial hardship at work and difficulty paying for her bills. The patient needed special work restriction along with financial assistance especially with future plan for bone marrow transplant. I give the patient a letter to support & help her to get work restriction   Orders Placed This Encounter  Procedures  . Kappa/lambda light chains    Standing Status:   Future    Standing Expiration Date:   06/25/2017  . Multiple Myeloma Panel (SPEP&IFE w/QIG)  Standing Status:   Future    Standing Expiration Date:   06/25/2017   All questions were answered. The patient knows to call the clinic with any problems, questions or concerns. No barriers to learning was detected. I spent 20 minutes counseling the patient face to face. The total time spent in the appointment was 30 minutes and more than 50% was on counseling and review of test results     Heath Lark, MD 05/22/2016 6:41 AM

## 2016-05-22 NOTE — Assessment & Plan Note (Signed)
This is likely anemia of chronic disease. The patient denies recent history of bleeding such as epistaxis, hematuria or hematochezia. She is asymptomatic from the anemia. We will observe for now.  She does not require transfusion now.   

## 2016-05-22 NOTE — Assessment & Plan Note (Signed)
She is encountering significant financial hardship at work and difficulty paying for her bills. The patient needed special work restriction along with financial assistance especially with future plan for bone marrow transplant. I give the patient a letter to support & help her to get work restriction

## 2016-05-24 ENCOUNTER — Telehealth: Payer: Self-pay | Admitting: *Deleted

## 2016-05-24 NOTE — Telephone Encounter (Signed)
Per 1/5 LOS I have scheduled appts and notified the scheduler 

## 2016-05-27 ENCOUNTER — Encounter: Payer: Self-pay | Admitting: *Deleted

## 2016-05-31 ENCOUNTER — Telehealth: Payer: Self-pay | Admitting: *Deleted

## 2016-05-31 NOTE — Telephone Encounter (Signed)
Received message from new pt coordinator, Jaydi Chalk, at [facility].  She informed pt is scheduled to see Dr. Norma Fredrickson on 06/23/16 at 12:45 pm.  She says they mailed letter to pt w/ appt information.   Informed pt of appt d/t and gave her their office number in case she needs to reschedule.  She verbalized understanding.

## 2016-06-01 ENCOUNTER — Other Ambulatory Visit (HOSPITAL_BASED_OUTPATIENT_CLINIC_OR_DEPARTMENT_OTHER): Payer: BC Managed Care – PPO

## 2016-06-01 ENCOUNTER — Ambulatory Visit (HOSPITAL_BASED_OUTPATIENT_CLINIC_OR_DEPARTMENT_OTHER): Payer: BC Managed Care – PPO

## 2016-06-01 VITALS — BP 104/73 | HR 81 | Temp 98.0°F | Resp 20

## 2016-06-01 DIAGNOSIS — Z5112 Encounter for antineoplastic immunotherapy: Secondary | ICD-10-CM | POA: Diagnosis not present

## 2016-06-01 DIAGNOSIS — C9000 Multiple myeloma not having achieved remission: Secondary | ICD-10-CM | POA: Diagnosis not present

## 2016-06-01 LAB — COMPREHENSIVE METABOLIC PANEL
ALT: 25 U/L (ref 0–55)
AST: 19 U/L (ref 5–34)
Albumin: 3.2 g/dL — ABNORMAL LOW (ref 3.5–5.0)
Alkaline Phosphatase: 79 U/L (ref 40–150)
Anion Gap: 8 mEq/L (ref 3–11)
BUN: 13.4 mg/dL (ref 7.0–26.0)
CO2: 30 mEq/L — ABNORMAL HIGH (ref 22–29)
Calcium: 9.5 mg/dL (ref 8.4–10.4)
Chloride: 103 mEq/L (ref 98–109)
Creatinine: 1 mg/dL (ref 0.6–1.1)
EGFR: 72 mL/min/{1.73_m2} — ABNORMAL LOW (ref >90)
Glucose: 110 mg/dl (ref 70–140)
Potassium: 3.8 mEq/L (ref 3.5–5.1)
Sodium: 141 mEq/L (ref 136–145)
Total Bilirubin: 0.9 mg/dL (ref 0.20–1.20)
Total Protein: 6.8 g/dL (ref 6.4–8.3)

## 2016-06-01 LAB — CBC WITH DIFFERENTIAL/PLATELET
BASO%: 0.7 % (ref 0.0–2.0)
Basophils Absolute: 0 10*3/uL (ref 0.0–0.1)
EOS%: 6 % (ref 0.0–7.0)
Eosinophils Absolute: 0.2 10*3/uL (ref 0.0–0.5)
HCT: 30.2 % — ABNORMAL LOW (ref 34.8–46.6)
HGB: 9.8 g/dL — ABNORMAL LOW (ref 11.6–15.9)
LYMPH%: 27.3 % (ref 14.0–49.7)
MCH: 30.5 pg (ref 25.1–34.0)
MCHC: 32.5 g/dL (ref 31.5–36.0)
MCV: 94.1 fL (ref 79.5–101.0)
MONO#: 0.5 10*3/uL (ref 0.1–0.9)
MONO%: 11.9 % (ref 0.0–14.0)
NEUT#: 2.2 10*3/uL (ref 1.5–6.5)
NEUT%: 54.1 % (ref 38.4–76.8)
Platelets: 298 10*3/uL (ref 145–400)
RBC: 3.21 10*6/uL — ABNORMAL LOW (ref 3.70–5.45)
RDW: 16.4 % — ABNORMAL HIGH (ref 11.2–14.5)
WBC: 4 10*3/uL (ref 3.9–10.3)
lymph#: 1.1 10*3/uL (ref 0.9–3.3)

## 2016-06-01 MED ORDER — BORTEZOMIB CHEMO SQ INJECTION 3.5 MG (2.5MG/ML)
1.3000 mg/m2 | Freq: Once | INTRAMUSCULAR | Status: AC
  Administered 2016-06-01: 3.25 mg via SUBCUTANEOUS
  Filled 2016-06-01: qty 1.3

## 2016-06-01 MED ORDER — PROCHLORPERAZINE MALEATE 10 MG PO TABS
ORAL_TABLET | ORAL | Status: AC
  Filled 2016-06-01: qty 1

## 2016-06-01 MED ORDER — PROCHLORPERAZINE MALEATE 10 MG PO TABS
10.0000 mg | ORAL_TABLET | Freq: Once | ORAL | Status: AC
  Administered 2016-06-01: 10 mg via ORAL

## 2016-06-01 NOTE — Patient Instructions (Signed)
[facility] Discharge Instructions for Patients Receiving Chemotherapy  Today you received the following chemotherapy agents:  Velcade  To help prevent nausea and vomiting after your treatment, we encourage you to take your nausea medication as ordered per MD.   If you develop nausea and vomiting that is not controlled by your nausea medication, call the clinic.   BELOW ARE SYMPTOMS THAT SHOULD BE REPORTED IMMEDIATELY:  *FEVER GREATER THAN 100.5 F  *CHILLS WITH OR WITHOUT FEVER  NAUSEA AND VOMITING THAT IS NOT CONTROLLED WITH YOUR NAUSEA MEDICATION  *UNUSUAL SHORTNESS OF BREATH  *UNUSUAL BRUISING OR BLEEDING  TENDERNESS IN MOUTH AND THROAT WITH OR WITHOUT PRESENCE OF ULCERS  *URINARY PROBLEMS  *BOWEL PROBLEMS  UNUSUAL RASH Items with * indicate a potential emergency and should be followed up as soon as possible.  Feel free to call the clinic you have any questions or concerns. The clinic phone number is [phone].  Please show the CHEMO ALERT CARD at check-in to the Emergency Department and triage nurse.   

## 2016-06-02 ENCOUNTER — Other Ambulatory Visit: Payer: Self-pay | Admitting: Hematology and Oncology

## 2016-06-02 NOTE — Telephone Encounter (Signed)
PLs refill electronically

## 2016-06-04 ENCOUNTER — Ambulatory Visit (HOSPITAL_BASED_OUTPATIENT_CLINIC_OR_DEPARTMENT_OTHER): Payer: BC Managed Care – PPO

## 2016-06-04 VITALS — BP 132/65 | HR 78 | Temp 98.4°F | Resp 18

## 2016-06-04 DIAGNOSIS — C9000 Multiple myeloma not having achieved remission: Secondary | ICD-10-CM

## 2016-06-04 DIAGNOSIS — Z5112 Encounter for antineoplastic immunotherapy: Secondary | ICD-10-CM

## 2016-06-04 MED ORDER — PROCHLORPERAZINE MALEATE 10 MG PO TABS
ORAL_TABLET | ORAL | Status: AC
  Filled 2016-06-04: qty 1

## 2016-06-04 MED ORDER — PROCHLORPERAZINE MALEATE 10 MG PO TABS
10.0000 mg | ORAL_TABLET | Freq: Once | ORAL | Status: AC
  Administered 2016-06-04: 10 mg via ORAL

## 2016-06-04 MED ORDER — BORTEZOMIB CHEMO SQ INJECTION 3.5 MG (2.5MG/ML)
1.3000 mg/m2 | Freq: Once | INTRAMUSCULAR | Status: AC
  Administered 2016-06-04: 3.25 mg via SUBCUTANEOUS
  Filled 2016-06-04: qty 3.25

## 2016-06-04 NOTE — Patient Instructions (Signed)
[facility] Discharge Instructions for Patients Receiving Chemotherapy  Today you received the following chemotherapy agents: velcade  To help prevent nausea and vomiting after your treatment, we encourage you to take your nausea medication as directed.   If you develop nausea and vomiting that is not controlled by your nausea medication, call the clinic.   BELOW ARE SYMPTOMS THAT SHOULD BE REPORTED IMMEDIATELY:  *FEVER GREATER THAN 100.5 F  *CHILLS WITH OR WITHOUT FEVER  NAUSEA AND VOMITING THAT IS NOT CONTROLLED WITH YOUR NAUSEA MEDICATION  *UNUSUAL SHORTNESS OF BREATH  *UNUSUAL BRUISING OR BLEEDING  TENDERNESS IN MOUTH AND THROAT WITH OR WITHOUT PRESENCE OF ULCERS  *URINARY PROBLEMS  *BOWEL PROBLEMS  UNUSUAL RASH Items with * indicate a potential emergency and should be followed up as soon as possible.  Feel free to call the clinic you have any questions or concerns. The clinic phone number is [phone].  Please show the CHEMO ALERT CARD at check-in to the Emergency Department and triage nurse.   

## 2016-06-08 ENCOUNTER — Ambulatory Visit (HOSPITAL_BASED_OUTPATIENT_CLINIC_OR_DEPARTMENT_OTHER): Payer: BC Managed Care – PPO

## 2016-06-08 ENCOUNTER — Telehealth: Payer: Self-pay | Admitting: Hematology and Oncology

## 2016-06-08 ENCOUNTER — Other Ambulatory Visit (HOSPITAL_BASED_OUTPATIENT_CLINIC_OR_DEPARTMENT_OTHER): Payer: BC Managed Care – PPO

## 2016-06-08 VITALS — BP 100/56 | HR 67 | Temp 97.9°F | Resp 18

## 2016-06-08 DIAGNOSIS — C9000 Multiple myeloma not having achieved remission: Secondary | ICD-10-CM

## 2016-06-08 DIAGNOSIS — Z5112 Encounter for antineoplastic immunotherapy: Secondary | ICD-10-CM

## 2016-06-08 LAB — COMPREHENSIVE METABOLIC PANEL
ALT: 34 U/L (ref 0–55)
AST: 23 U/L (ref 5–34)
Albumin: 3.2 g/dL — ABNORMAL LOW (ref 3.5–5.0)
Alkaline Phosphatase: 88 U/L (ref 40–150)
Anion Gap: 8 mEq/L (ref 3–11)
BUN: 17.8 mg/dL (ref 7.0–26.0)
CO2: 28 mEq/L (ref 22–29)
Calcium: 9 mg/dL (ref 8.4–10.4)
Chloride: 102 mEq/L (ref 98–109)
Creatinine: 1 mg/dL (ref 0.6–1.1)
EGFR: 73 mL/min/{1.73_m2} — ABNORMAL LOW (ref >90)
Glucose: 87 mg/dl (ref 70–140)
Potassium: 3.7 mEq/L (ref 3.5–5.1)
Sodium: 139 mEq/L (ref 136–145)
Total Bilirubin: 0.78 mg/dL (ref 0.20–1.20)
Total Protein: 6.7 g/dL (ref 6.4–8.3)

## 2016-06-08 LAB — CBC WITH DIFFERENTIAL/PLATELET
BASO%: 0.2 % (ref 0.0–2.0)
Basophils Absolute: 0 10*3/uL (ref 0.0–0.1)
EOS%: 2.6 % (ref 0.0–7.0)
Eosinophils Absolute: 0.1 10*3/uL (ref 0.0–0.5)
HCT: 30.6 % — ABNORMAL LOW (ref 34.8–46.6)
HGB: 10.1 g/dL — ABNORMAL LOW (ref 11.6–15.9)
LYMPH%: 23.6 % (ref 14.0–49.7)
MCH: 30.9 pg (ref 25.1–34.0)
MCHC: 33 g/dL (ref 31.5–36.0)
MCV: 93.6 fL (ref 79.5–101.0)
MONO#: 0.3 10*3/uL (ref 0.1–0.9)
MONO%: 6.1 % (ref 0.0–14.0)
NEUT#: 3.1 10*3/uL (ref 1.5–6.5)
NEUT%: 67.5 % (ref 38.4–76.8)
Platelets: 197 10*3/uL (ref 145–400)
RBC: 3.27 10*6/uL — ABNORMAL LOW (ref 3.70–5.45)
RDW: 16.3 % — ABNORMAL HIGH (ref 11.2–14.5)
WBC: 4.6 10*3/uL (ref 3.9–10.3)
lymph#: 1.1 10*3/uL (ref 0.9–3.3)
nRBC: 2 % — ABNORMAL HIGH (ref 0–0)

## 2016-06-08 MED ORDER — PROCHLORPERAZINE MALEATE 10 MG PO TABS
ORAL_TABLET | ORAL | Status: AC
  Filled 2016-06-08: qty 1

## 2016-06-08 MED ORDER — PROCHLORPERAZINE MALEATE 10 MG PO TABS
10.0000 mg | ORAL_TABLET | Freq: Once | ORAL | Status: AC
  Administered 2016-06-08: 10 mg via ORAL

## 2016-06-08 MED ORDER — BORTEZOMIB CHEMO SQ INJECTION 3.5 MG (2.5MG/ML)
1.3000 mg/m2 | Freq: Once | INTRAMUSCULAR | Status: AC
  Administered 2016-06-08: 3.25 mg via SUBCUTANEOUS
  Filled 2016-06-08: qty 3.25

## 2016-06-08 NOTE — Telephone Encounter (Signed)
FAXED RECORDS TO [facility] RELEASE ID [id number]

## 2016-06-08 NOTE — Patient Instructions (Signed)
[place] Discharge Instructions for Patients Receiving Chemotherapy  Today you received the following chemotherapy agents VELCADE  To help prevent nausea and vomiting after your treatment, we encourage you to take your nausea medication nausea med as prescribed  If you develop nausea and vomiting that is not controlled by your nausea medication, call the clinic.   BELOW ARE SYMPTOMS THAT SHOULD BE REPORTED IMMEDIATELY:  *FEVER GREATER THAN 100.5 F  *CHILLS WITH OR WITHOUT FEVER  NAUSEA AND VOMITING THAT IS NOT CONTROLLED WITH YOUR NAUSEA MEDICATION  *UNUSUAL SHORTNESS OF BREATH  *UNUSUAL BRUISING OR BLEEDING  TENDERNESS IN MOUTH AND THROAT WITH OR WITHOUT PRESENCE OF ULCERS  *URINARY PROBLEMS  *BOWEL PROBLEMS  UNUSUAL RASH Items with * indicate a potential emergency and should be followed up as soon as possible.  Feel free to call the clinic you have any questions or concerns. The clinic phone number is [phone].  Please show the [place] at check-in to the Emergency Department and triage nurse.

## 2016-06-09 LAB — KAPPA/LAMBDA LIGHT CHAINS
Ig Kappa Free Light Chain: 23.2 mg/L — ABNORMAL HIGH (ref 3.3–19.4)
Ig Lambda Free Light Chain: 15.6 mg/L (ref 5.7–26.3)
Kappa/Lambda FluidC Ratio: 1.49 (ref 0.26–1.65)

## 2016-06-11 ENCOUNTER — Ambulatory Visit (HOSPITAL_BASED_OUTPATIENT_CLINIC_OR_DEPARTMENT_OTHER): Payer: BC Managed Care – PPO

## 2016-06-11 VITALS — BP 107/65 | HR 72 | Temp 97.9°F | Resp 18

## 2016-06-11 DIAGNOSIS — C9000 Multiple myeloma not having achieved remission: Secondary | ICD-10-CM | POA: Diagnosis not present

## 2016-06-11 DIAGNOSIS — Z5112 Encounter for antineoplastic immunotherapy: Secondary | ICD-10-CM

## 2016-06-11 LAB — MULTIPLE MYELOMA PANEL, SERUM
Albumin SerPl Elph-Mcnc: 3.2 g/dL (ref 2.9–4.4)
Albumin/Glob SerPl: 1.1 (ref 0.7–1.7)
Alpha 1: 0.3 g/dL (ref 0.0–0.4)
Alpha2 Glob SerPl Elph-Mcnc: 0.8 g/dL (ref 0.4–1.0)
B-Globulin SerPl Elph-Mcnc: 1 g/dL (ref 0.7–1.3)
Gamma Glob SerPl Elph-Mcnc: 1 g/dL (ref 0.4–1.8)
Globulin, Total: 3.1 g/dL (ref 2.2–3.9)
IgA, Qn, Serum: 37 mg/dL — ABNORMAL LOW (ref 87–352)
IgG, Qn, Serum: 1061 mg/dL (ref 700–1600)
IgM, Qn, Serum: 38 mg/dL (ref 26–217)
M Protein SerPl Elph-Mcnc: 0.7 g/dL — ABNORMAL HIGH
Total Protein: 6.3 g/dL (ref 6.0–8.5)

## 2016-06-11 MED ORDER — PROCHLORPERAZINE MALEATE 10 MG PO TABS
ORAL_TABLET | ORAL | Status: AC
  Filled 2016-06-11: qty 1

## 2016-06-11 MED ORDER — BORTEZOMIB CHEMO SQ INJECTION 3.5 MG (2.5MG/ML)
1.3000 mg/m2 | Freq: Once | INTRAMUSCULAR | Status: AC
  Administered 2016-06-11: 3.25 mg via SUBCUTANEOUS
  Filled 2016-06-11: qty 3.25

## 2016-06-11 MED ORDER — PROCHLORPERAZINE MALEATE 10 MG PO TABS
10.0000 mg | ORAL_TABLET | Freq: Once | ORAL | Status: AC
Start: 2016-06-11 — End: 2016-06-11
  Administered 2016-06-11: 10 mg via ORAL

## 2016-06-11 NOTE — Patient Instructions (Signed)
[place] Discharge Instructions for Patients Receiving Chemotherapy  Today you received the following chemotherapy agents VELCADE  To help prevent nausea and vomiting after your treatment, we encourage you to take your nausea medication nausea med as prescribed  If you develop nausea and vomiting that is not controlled by your nausea medication, call the clinic.   BELOW ARE SYMPTOMS THAT SHOULD BE REPORTED IMMEDIATELY:  *FEVER GREATER THAN 100.5 F  *CHILLS WITH OR WITHOUT FEVER  NAUSEA AND VOMITING THAT IS NOT CONTROLLED WITH YOUR NAUSEA MEDICATION  *UNUSUAL SHORTNESS OF BREATH  *UNUSUAL BRUISING OR BLEEDING  TENDERNESS IN MOUTH AND THROAT WITH OR WITHOUT PRESENCE OF ULCERS  *URINARY PROBLEMS  *BOWEL PROBLEMS  UNUSUAL RASH Items with * indicate a potential emergency and should be followed up as soon as possible.  Feel free to call the clinic you have any questions or concerns. The clinic phone number is [phone].  Please show the [place] at check-in to the Emergency Department and triage nurse.

## 2016-06-22 ENCOUNTER — Ambulatory Visit (HOSPITAL_BASED_OUTPATIENT_CLINIC_OR_DEPARTMENT_OTHER): Payer: BC Managed Care – PPO

## 2016-06-22 ENCOUNTER — Other Ambulatory Visit (HOSPITAL_BASED_OUTPATIENT_CLINIC_OR_DEPARTMENT_OTHER): Payer: BC Managed Care – PPO

## 2016-06-22 ENCOUNTER — Ambulatory Visit (HOSPITAL_BASED_OUTPATIENT_CLINIC_OR_DEPARTMENT_OTHER): Payer: BC Managed Care – PPO | Admitting: Hematology and Oncology

## 2016-06-22 DIAGNOSIS — C9000 Multiple myeloma not having achieved remission: Secondary | ICD-10-CM

## 2016-06-22 DIAGNOSIS — C9001 Multiple myeloma in remission: Secondary | ICD-10-CM

## 2016-06-22 DIAGNOSIS — D6481 Anemia due to antineoplastic chemotherapy: Secondary | ICD-10-CM | POA: Diagnosis not present

## 2016-06-22 DIAGNOSIS — Z5112 Encounter for antineoplastic immunotherapy: Secondary | ICD-10-CM

## 2016-06-22 DIAGNOSIS — T451X5A Adverse effect of antineoplastic and immunosuppressive drugs, initial encounter: Secondary | ICD-10-CM

## 2016-06-22 LAB — COMPREHENSIVE METABOLIC PANEL
ALT: 26 U/L (ref 0–55)
AST: 19 U/L (ref 5–34)
Albumin: 3.5 g/dL (ref 3.5–5.0)
Alkaline Phosphatase: 90 U/L (ref 40–150)
Anion Gap: 7 mEq/L (ref 3–11)
BUN: 18.8 mg/dL (ref 7.0–26.0)
CO2: 30 mEq/L — ABNORMAL HIGH (ref 22–29)
Calcium: 9.6 mg/dL (ref 8.4–10.4)
Chloride: 103 mEq/L (ref 98–109)
Creatinine: 1 mg/dL (ref 0.6–1.1)
EGFR: 68 mL/min/{1.73_m2} — ABNORMAL LOW (ref >90)
Glucose: 83 mg/dl (ref 70–140)
Potassium: 4 mEq/L (ref 3.5–5.1)
Sodium: 139 mEq/L (ref 136–145)
Total Bilirubin: 0.97 mg/dL (ref 0.20–1.20)
Total Protein: 6.9 g/dL (ref 6.4–8.3)

## 2016-06-22 LAB — CBC WITH DIFFERENTIAL/PLATELET
BASO%: 0.5 % (ref 0.0–2.0)
Basophils Absolute: 0 10*3/uL (ref 0.0–0.1)
EOS%: 5.5 % (ref 0.0–7.0)
Eosinophils Absolute: 0.3 10*3/uL (ref 0.0–0.5)
HCT: 33 % — ABNORMAL LOW (ref 34.8–46.6)
HGB: 10.8 g/dL — ABNORMAL LOW (ref 11.6–15.9)
LYMPH%: 26.1 % (ref 14.0–49.7)
MCH: 30.9 pg (ref 25.1–34.0)
MCHC: 32.7 g/dL (ref 31.5–36.0)
MCV: 94.6 fL (ref 79.5–101.0)
MONO#: 0.9 10*3/uL (ref 0.1–0.9)
MONO%: 15.9 % — ABNORMAL HIGH (ref 0.0–14.0)
NEUT#: 3.1 10*3/uL (ref 1.5–6.5)
NEUT%: 52 % (ref 38.4–76.8)
Platelets: 347 10*3/uL (ref 145–400)
RBC: 3.48 10*6/uL — ABNORMAL LOW (ref 3.70–5.45)
RDW: 16.5 % — ABNORMAL HIGH (ref 11.2–14.5)
WBC: 5.9 10*3/uL (ref 3.9–10.3)
lymph#: 1.5 10*3/uL (ref 0.9–3.3)

## 2016-06-22 MED ORDER — PROCHLORPERAZINE MALEATE 10 MG PO TABS
ORAL_TABLET | ORAL | Status: AC
  Filled 2016-06-22: qty 1

## 2016-06-22 MED ORDER — PROCHLORPERAZINE MALEATE 10 MG PO TABS
10.0000 mg | ORAL_TABLET | Freq: Once | ORAL | Status: AC
  Administered 2016-06-22: 10 mg via ORAL

## 2016-06-22 MED ORDER — BORTEZOMIB CHEMO SQ INJECTION 3.5 MG (2.5MG/ML)
1.3000 mg/m2 | Freq: Once | INTRAMUSCULAR | Status: AC
  Administered 2016-06-22: 3.25 mg via SUBCUTANEOUS
  Filled 2016-06-22: qty 3.25

## 2016-06-22 NOTE — Patient Instructions (Signed)
[facility] Discharge Instructions for Patients Receiving Chemotherapy  Today you received the following chemotherapy agents Velcade. To help prevent nausea and vomiting after your treatment, we encourage you to take your nausea medication as directed.  If you develop nausea and vomiting that is not controlled by your nausea medication, call the clinic.   BELOW ARE SYMPTOMS THAT SHOULD BE REPORTED IMMEDIATELY:  *FEVER GREATER THAN 100.5 F  *CHILLS WITH OR WITHOUT FEVER  NAUSEA AND VOMITING THAT IS NOT CONTROLLED WITH YOUR NAUSEA MEDICATION  *UNUSUAL SHORTNESS OF BREATH  *UNUSUAL BRUISING OR BLEEDING  TENDERNESS IN MOUTH AND THROAT WITH OR WITHOUT PRESENCE OF ULCERS  *URINARY PROBLEMS  *BOWEL PROBLEMS  UNUSUAL RASH Items with * indicate a potential emergency and should be followed up as soon as possible.  Feel free to call the clinic you have any questions or concerns. The clinic phone number is [phone].  Please show the CHEMO ALERT CARD at check-in to the Emergency Department and triage nurse.    

## 2016-06-23 ENCOUNTER — Other Ambulatory Visit: Payer: Self-pay | Admitting: Hematology and Oncology

## 2016-06-23 ENCOUNTER — Encounter: Payer: Self-pay | Admitting: Hematology and Oncology

## 2016-06-23 DIAGNOSIS — D6481 Anemia due to antineoplastic chemotherapy: Secondary | ICD-10-CM | POA: Insufficient documentation

## 2016-06-23 DIAGNOSIS — T451X5A Adverse effect of antineoplastic and immunosuppressive drugs, initial encounter: Secondary | ICD-10-CM | POA: Insufficient documentation

## 2016-06-23 NOTE — Assessment & Plan Note (Signed)

## 2016-06-23 NOTE — Progress Notes (Signed)
[place] OFFICE PROGRESS NOTE  Patient Care Team: Lucianne Lei, MD as PCP - General (Family Medicine)  SUMMARY OF ONCOLOGIC HISTORY:   Multiple myeloma ([place])   03/22/2016 Bone Marrow Biopsy    Bone Marrow Biopsy: Plasma cells 17% by Aspirate and 30% by CD 138 stain. Plasma cell neoplasm, Kappa Restricted Normal cytogenetics, FISH positive for +11 and +14 and +7      04/13/2016 Imaging    Skeletal survey showed lucencies within the calvarium worrisome for myeloma. No definite abnormal lytic or blastic lesions are observed elsewhere.      04/19/2016 -  Chemotherapy    The patient consented to treatment with Revlimid, dexamethasone and Velcade       INTERVAL HISTORY: Please see below for problem oriented charting. She returns today for cycle 4 of treatment. She tolerated treatment very well without major side effects. Denies peripheral neuropathy. No recent infection. Denies recent bone pain.  REVIEW OF SYSTEMS:   Constitutional: Denies fevers, chills or abnormal weight loss Eyes: Denies blurriness of vision Ears, nose, mouth, throat, and face: Denies mucositis or sore throat Respiratory: Denies cough, dyspnea or wheezes Cardiovascular: Denies palpitation, chest discomfort or lower extremity swelling Gastrointestinal:  Denies nausea, heartburn or change in bowel habits Skin: Denies abnormal skin rashes Lymphatics: Denies new lymphadenopathy or easy bruising Neurological:Denies numbness, tingling or new weaknesses Behavioral/Psych: Mood is stable, no new changes  All other systems were reviewed with the patient and are negative.  I have reviewed the past medical history, past surgical history, social history and family history with the patient and they are unchanged from previous note.  ALLERGIES:  has No Known Allergies.  MEDICATIONS:  Current Outpatient Prescriptions  Medication Sig Dispense Refill  . acyclovir (ZOVIRAX) 400 MG tablet Take 1 tablet (400  mg total) by mouth 2 (two) times daily. 60 tablet 3  . ASPIRIN 81 PO Take 81 mg by mouth daily.    . bisacodyl (BISACODYL) 5 MG EC tablet Take 5 mg by mouth daily as needed for moderate constipation.    . Cholecalciferol (VITAMIN D3 PO) Take 5,000 Int'l Units by mouth daily.    . Cyanocobalamin (VITAMIN B-12 PO) Take 5,000 mcg by mouth 2 (two) times daily.    Marland Kitchen dexamethasone (DECADRON) 4 MG tablet Take 10 tablets (40 mg) weekly 30 tablet 3  . FIBER PO Take by mouth daily.    Marland Kitchen losartan-hydrochlorothiazide (HYZAAR) 100-25 MG tablet Take 1 tablet by mouth daily. 90 tablet 3  . Multiple Vitamin (MULTIVITAMIN) tablet Take 1 tablet by mouth daily.    . Omega-3 Fatty Acids (OMEGA-3 FISH OIL PO) Take by mouth daily.    Marland Kitchen REVLIMID 10 MG capsule TAKE 1 CAPSULE (10MG) BY MOUTH ONCE DAILY FOR 14 DAYS ON THEN 7 DAYS OFF. 14 capsule 0  . Sodium Fluoride 0.15 % PSTE Use as directed in the mouth or throat.    . ondansetron (ZOFRAN) 8 MG tablet Take 1 tablet (8 mg total) by mouth 2 (two) times daily as needed (Nausea or vomiting). (Patient not taking: Reported on 06/22/2016) 30 tablet 1  . prochlorperazine (COMPAZINE) 10 MG tablet Take 1 tablet (10 mg total) by mouth every 6 (six) hours as needed (Nausea or vomiting). (Patient not taking: Reported on 06/22/2016) 30 tablet 1   No current facility-administered medications for this visit.     PHYSICAL EXAMINATION: ECOG PERFORMANCE STATUS: 0 - Asymptomatic  Vitals:   06/22/16 1444  BP: 111/65  Pulse: 67  Resp: 18  Temp: 98 F (36.7 C)   Filed Weights   06/22/16 1444  Weight: 296 lb 4.8 oz (134.4 kg)    GENERAL:alert, no distress and comfortable SKIN: skin color, texture, turgor are normal, no rashes or significant lesions EYES: normal, Conjunctiva are pink and non-injected, sclera clear OROPHARYNX:no exudate, no erythema and lips, buccal mucosa, and tongue normal  NECK: supple, thyroid normal size, non-tender, without nodularity LYMPH:  no palpable  lymphadenopathy in the cervical, axillary or inguinal LUNGS: clear to auscultation and percussion with normal breathing effort HEART: regular rate & rhythm and no murmurs and no lower extremity edema ABDOMEN:abdomen soft, non-tender and normal bowel sounds Musculoskeletal:no cyanosis of digits and no clubbing  NEURO: alert & oriented x 3 with fluent speech, no focal motor/sensory deficits  LABORATORY DATA:  I have reviewed the data as listed    Component Value Date/Time   NA 139 06/22/2016 1432   K 4.0 06/22/2016 1432   CO2 30 (H) 06/22/2016 1432   GLUCOSE 83 06/22/2016 1432   BUN 18.8 06/22/2016 1432   CREATININE 1.0 06/22/2016 1432   CALCIUM 9.6 06/22/2016 1432   PROT 6.9 06/22/2016 1432   ALBUMIN 3.5 06/22/2016 1432   AST 19 06/22/2016 1432   ALT 26 06/22/2016 1432   ALKPHOS 90 06/22/2016 1432   BILITOT 0.97 06/22/2016 1432    No results found for: SPEP, UPEP  Lab Results  Component Value Date   WBC 5.9 06/22/2016   NEUTROABS 3.1 06/22/2016   HGB 10.8 (L) 06/22/2016   HCT 33.0 (L) 06/22/2016   MCV 94.6 06/22/2016   PLT 347 06/22/2016      Chemistry      Component Value Date/Time   NA 139 06/22/2016 1432   K 4.0 06/22/2016 1432   CO2 30 (H) 06/22/2016 1432   BUN 18.8 06/22/2016 1432   CREATININE 1.0 06/22/2016 1432      Component Value Date/Time   CALCIUM 9.6 06/22/2016 1432   ALKPHOS 90 06/22/2016 1432   AST 19 06/22/2016 1432   ALT 26 06/22/2016 1432   BILITOT 0.97 06/22/2016 1432      ASSESSMENT & PLAN:  Multiple myeloma ([place]) She tolerated treatment well without major side effects. Recent myeloma panel already showed greater than 50% reduction of M protein, normalization of IgG and reduction of serum light chain She has appointment to [facility] for [facility] evaluation. She will continue aspirin for DVT prophylaxis, acyclovir for antimicrobial prophylaxis along with calcium and vitamin D. She will receive Zometa every 3 months. She will complete  cycle 4 of treatment next week. I do not have her return appointment for the patient to come back unless her transplant doctor felt that she needs additional cycles of treatment  Anemia due to antineoplastic chemotherapy This is likely due to recent treatment. The patient denies recent history of bleeding such as epistaxis, hematuria or hematochezia. She is asymptomatic from the anemia. I will observe for now.  She does not require transfusion now. I will continue the chemotherapy at current dose without dosage adjustment.  If the anemia gets progressive worse in the future, I might have to delay her treatment or adjust the chemotherapy dose.    No orders of the defined types were placed in this encounter.  All questions were answered. The patient knows to call the clinic with any problems, questions or concerns. No barriers to learning was detected. I spent 15 minutes counseling the patient face to face. The total time  spent in the appointment was 20 minutes and more than 50% was on counseling and review of test results     Heath Lark, MD 06/23/2016 6:01 PM

## 2016-06-23 NOTE — Assessment & Plan Note (Signed)
She tolerated treatment well without major side effects. Recent myeloma panel already showed greater than 50% reduction of M protein, normalization of IgG and reduction of serum light chain She has appointment to [facility] for transplant evaluation. She will continue aspirin for DVT prophylaxis, acyclovir for antimicrobial prophylaxis along with calcium and vitamin D. She will receive Zometa every 3 months. She will complete cycle 4 of treatment next week. I do not have her return appointment for the patient to come back unless her transplant doctor felt that she needs additional cycles of treatment

## 2016-06-23 NOTE — Telephone Encounter (Signed)
Please refill electronically 

## 2016-06-25 ENCOUNTER — Telehealth: Payer: Self-pay | Admitting: *Deleted

## 2016-06-25 ENCOUNTER — Other Ambulatory Visit: Payer: Self-pay | Admitting: *Deleted

## 2016-06-25 ENCOUNTER — Ambulatory Visit (HOSPITAL_BASED_OUTPATIENT_CLINIC_OR_DEPARTMENT_OTHER): Payer: BC Managed Care – PPO

## 2016-06-25 VITALS — BP 105/50 | HR 81 | Temp 97.8°F | Resp 16

## 2016-06-25 DIAGNOSIS — Z5112 Encounter for antineoplastic immunotherapy: Secondary | ICD-10-CM

## 2016-06-25 DIAGNOSIS — C9000 Multiple myeloma not having achieved remission: Secondary | ICD-10-CM | POA: Diagnosis not present

## 2016-06-25 MED ORDER — BORTEZOMIB CHEMO SQ INJECTION 3.5 MG (2.5MG/ML)
1.3000 mg/m2 | Freq: Once | INTRAMUSCULAR | Status: AC
  Administered 2016-06-25: 3.25 mg via SUBCUTANEOUS
  Filled 2016-06-25: qty 3.25

## 2016-06-25 MED ORDER — PROCHLORPERAZINE MALEATE 10 MG PO TABS
ORAL_TABLET | ORAL | Status: AC
  Filled 2016-06-25: qty 1

## 2016-06-25 MED ORDER — PROCHLORPERAZINE MALEATE 10 MG PO TABS
10.0000 mg | ORAL_TABLET | Freq: Once | ORAL | Status: AC
  Administered 2016-06-25: 10 mg via ORAL

## 2016-06-25 MED ORDER — LENALIDOMIDE 10 MG PO CAPS: ORAL_CAPSULE | ORAL | 0 refills | Status: DC

## 2016-06-25 NOTE — Patient Instructions (Signed)
[facility] Discharge Instructions for Patients Receiving Chemotherapy  Today you received the following chemotherapy agents velcade   To help prevent nausea and vomiting after your treatment, we encourage you to take your nausea medication as directed  If you develop nausea and vomiting that is not controlled by your nausea medication, call the clinic.   BELOW ARE SYMPTOMS THAT SHOULD BE REPORTED IMMEDIATELY:  *FEVER GREATER THAN 100.5 F  *CHILLS WITH OR WITHOUT FEVER  NAUSEA AND VOMITING THAT IS NOT CONTROLLED WITH YOUR NAUSEA MEDICATION  *UNUSUAL SHORTNESS OF BREATH  *UNUSUAL BRUISING OR BLEEDING  TENDERNESS IN MOUTH AND THROAT WITH OR WITHOUT PRESENCE OF ULCERS  *URINARY PROBLEMS  *BOWEL PROBLEMS  UNUSUAL RASH Items with * indicate a potential emergency and should be followed up as soon as possible.  Feel free to call the clinic you have any questions or concerns. The clinic phone number is [phone].  

## 2016-06-25 NOTE — Telephone Encounter (Signed)
LM that Dr Alvy Bimler has seen recommendations from Dr Norma Fredrickson. Does she want treatments on Tuesdays or Fridays beginning week of 2/26?  Please call us at your convenience

## 2016-06-26 ENCOUNTER — Encounter: Payer: Self-pay | Admitting: Hematology and Oncology

## 2016-06-27 ENCOUNTER — Other Ambulatory Visit: Payer: Self-pay | Admitting: Hematology and Oncology

## 2016-06-28 ENCOUNTER — Telehealth: Payer: Self-pay | Admitting: Nurse Practitioner

## 2016-06-28 ENCOUNTER — Telehealth: Payer: Self-pay

## 2016-06-28 ENCOUNTER — Ambulatory Visit (HOSPITAL_BASED_OUTPATIENT_CLINIC_OR_DEPARTMENT_OTHER): Payer: BC Managed Care – PPO | Admitting: Nurse Practitioner

## 2016-06-28 VITALS — BP 110/55 | HR 78 | Temp 98.7°F | Resp 18 | Ht 69.0 in | Wt 288.4 lb

## 2016-06-28 DIAGNOSIS — C9000 Multiple myeloma not having achieved remission: Secondary | ICD-10-CM

## 2016-06-28 DIAGNOSIS — R634 Abnormal weight loss: Secondary | ICD-10-CM

## 2016-06-28 DIAGNOSIS — R21 Rash and other nonspecific skin eruption: Secondary | ICD-10-CM

## 2016-06-28 DIAGNOSIS — R05 Cough: Secondary | ICD-10-CM

## 2016-06-28 DIAGNOSIS — K5900 Constipation, unspecified: Secondary | ICD-10-CM

## 2016-06-28 DIAGNOSIS — C9001 Multiple myeloma in remission: Secondary | ICD-10-CM

## 2016-06-28 NOTE — Telephone Encounter (Signed)
S/w pt per Dr Alvy Bimler message, to stop revlimid and take Benadryl. Dr Alvy Bimler said to have her see [facility] and not keep the appt with Dr NG tomorrow. Pt will come about 1030 unless told another time.

## 2016-06-28 NOTE — Telephone Encounter (Signed)
Sat Rash on bottom of feet, increased burning pain when walking. Also itching Sun hands, this AM on back of hand and into forearms both sides. And hands are puffy. Same type pain of itching and burning.  Last velcade SQ 2/9, taking revlimid. She wishes to be seen. She lives about 15 minutes away.

## 2016-06-28 NOTE — Patient Instructions (Signed)
1. Take Benadryl today and repeat 6 hours later 2. If rash improved but not gone tomorrow may take Zyrtec if preferred 3. If rash not improved in 24 hours call [place]. 4. Take stool softeners daily, increase to twice a day if needed to pass stool without straining at least once every 48 hours (preferably every 24 hours) 5. Push fluids to help relieve cough; OTC cough syrups are okay. Call if you begin to run a fever. 6. Increase caloric intake; protein shakes, milkshakes, smoothies, etc.

## 2016-06-28 NOTE — Telephone Encounter (Signed)
I am overbooked. I have 6 patients in the hospital in addition to my clinic. I do not know if there is anyone at symptom management; if not she can either go to urgent care or PCP I recommend stopping Revlimid and take Benadryl for itching for now Advise her to take pictures I can see her tomorrow at 3 pm before her injection appt; that's the best I can offer

## 2016-06-28 NOTE — Progress Notes (Signed)
SYMPTOM MANAGEMENT CLINIC    Chief Complaint:  New Rash to hands and feet, with burning pain when walking, itching and swelling in hands. Last Velcade Sq on 2/9, taking revlimid daily.   HPI:  Carolyn Newman 66 y.o. female diagnosed with multiple myeloma returns today with new complaint of rash    Multiple myeloma ([place])   03/22/2016 Bone Marrow Biopsy    Bone Marrow Biopsy: Plasma cells 17% by Aspirate and 30% by CD 138 stain. Plasma cell neoplasm, Kappa Restricted Normal cytogenetics, FISH positive for +11 and +14 and +7      04/13/2016 Imaging    Skeletal survey showed lucencies within the calvarium worrisome for myeloma. No definite abnormal lytic or blastic lesions are observed elsewhere.      04/19/2016 -  Chemotherapy    The patient consented to treatment with Revlimid, dexamethasone and Velcade       ROS - positive for constipation, rash to arms, hands, ankles and feet, cough that is mostly non productive. ROS otherwise negative.   Past Medical History:  Diagnosis Date  . Anemia   . Cancer ([place])    myeloma  . Hypertension     Past Surgical History:  Procedure Laterality Date  . EYE SURGERY      has Normocytic anemia; Multiple myeloma ([place]); Multiple myeloma not having achieved remission ([place]); Pancytopenia, acquired ([place]); Elevated serum creatinine; Financial difficulty; and Anemia due to antineoplastic chemotherapy on her problem list.    has No Known Allergies.  Allergies as of 06/28/2016   No Known Allergies     Medication List       Accurate as of 06/28/16 11:25 AM. Always use your most recent med list.          acyclovir 400 MG tablet Commonly known as:  ZOVIRAX Take 1 tablet (400 mg total) by mouth 2 (two) times daily.   ASPIRIN 81 PO Take 81 mg by mouth daily.   bisacodyl 5 MG EC tablet Generic drug:  bisacodyl Take 5 mg by mouth daily as needed for moderate constipation.   dexamethasone 4 MG tablet Commonly known as:  DECADRON Take  10 tablets (40 mg) weekly   FIBER PO Take by mouth daily.   lenalidomide 10 MG capsule Commonly known as:  REVLIMID TAKE 1 CAPSULE (10MG) BY MOUTH ONCE DAILY FOR 14 DAYS ON THEN 7 DAYS OFF.   losartan-hydrochlorothiazide 100-25 MG tablet Commonly known as:  HYZAAR Take 1 tablet by mouth daily.   multivitamin tablet Take 1 tablet by mouth daily.   OMEGA-3 FISH OIL PO Take by mouth daily.   ondansetron 8 MG tablet Commonly known as:  ZOFRAN Take 1 tablet (8 mg total) by mouth 2 (two) times daily as needed (Nausea or vomiting).   prochlorperazine 10 MG tablet Commonly known as:  COMPAZINE Take 1 tablet (10 mg total) by mouth every 6 (six) hours as needed (Nausea or vomiting).   Sodium Fluoride 0.15 % Pste Use as directed in the mouth or throat.   VITAMIN B-12 PO Take 5,000 mcg by mouth 2 (two) times daily.   VITAMIN D3 PO Take 5,000 Int'l Units by mouth daily.        PHYSICAL EXAMINATION  GENERAL: well nourished african [place] female in no acute distress HEENT: normocephalic, EOMI, no erythema or mouth sores noted LUNGS: clear to ausculation, no wheezing, crackles or rhonchi noted CARDIAC: S1, S2, no M/g/r ABD: obese, soft, no tenderness noted Extremities/ Skin: rash noted to both  arms, hands, ankles and feet; see pictures below; some puffiness noted to hands and feet; rash extends up forearms to elbows bilaterally          Oncology Vitals 06/28/2016 06/25/2016  Height 175 cm -  Weight 130.817 kg -  Weight (lbs) 288 lbs 6 oz -  BMI (kg/m2) 42.59 kg/m2 -  Temp 98.7 97.8  Pulse 78 81  Resp 18 16  SpO2 100 100  BSA (m2) 2.52 m2 -   BP Readings from Last 2 Encounters:  06/28/16 (!) 110/55  06/25/16 (!) 105/50    Physical Exam  LABORATORY DATA:. No visits with results within 3 Day(s) from this visit.  Latest known visit with results is:  Appointment on 06/22/2016  Component Date Value Ref Range Status  . WBC 06/22/2016 5.9  3.9 - 10.3 10e3/uL  Final  . NEUT# 06/22/2016 3.1  1.5 - 6.5 10e3/uL Final  . HGB 06/22/2016 10.8* 11.6 - 15.9 g/dL Final  . HCT 06/22/2016 33.0* 34.8 - 46.6 % Final  . Platelets 06/22/2016 347  145 - 400 10e3/uL Final  . MCV 06/22/2016 94.6  79.5 - 101.0 fL Final  . MCH 06/22/2016 30.9  25.1 - 34.0 pg Final  . MCHC 06/22/2016 32.7  31.5 - 36.0 g/dL Final  . RBC 06/22/2016 3.48* 3.70 - 5.45 10e6/uL Final  . RDW 06/22/2016 16.5* 11.2 - 14.5 % Final  . lymph# 06/22/2016 1.5  0.9 - 3.3 10e3/uL Final  . MONO# 06/22/2016 0.9  0.1 - 0.9 10e3/uL Final  . Eosinophils Absolute 06/22/2016 0.3  0.0 - 0.5 10e3/uL Final  . Basophils Absolute 06/22/2016 0.0  0.0 - 0.1 10e3/uL Final  . NEUT% 06/22/2016 52.0  38.4 - 76.8 % Final  . LYMPH% 06/22/2016 26.1  14.0 - 49.7 % Final  . MONO% 06/22/2016 15.9* 0.0 - 14.0 % Final  . EOS% 06/22/2016 5.5  0.0 - 7.0 % Final  . BASO% 06/22/2016 0.5  0.0 - 2.0 % Final  . Sodium 06/22/2016 139  136 - 145 mEq/L Final  . Potassium 06/22/2016 4.0  3.5 - 5.1 mEq/L Final  . Chloride 06/22/2016 103  98 - 109 mEq/L Final  . CO2 06/22/2016 30* 22 - 29 mEq/L Final  . Glucose 06/22/2016 83  70 - 140 mg/dl Final  . BUN 06/22/2016 18.8  7.0 - 26.0 mg/dL Final  . Creatinine 06/22/2016 1.0  0.6 - 1.1 mg/dL Final  . Total Bilirubin 06/22/2016 0.97  0.20 - 1.20 mg/dL Final  . Alkaline Phosphatase 06/22/2016 90  40 - 150 U/L Final  . AST 06/22/2016 19  5 - 34 U/L Final  . ALT 06/22/2016 26  0 - 55 U/L Final  . Total Protein 06/22/2016 6.9  6.4 - 8.3 g/dL Final  . Albumin 06/22/2016 3.5  3.5 - 5.0 g/dL Final  . Calcium 06/22/2016 9.6  8.4 - 10.4 mg/dL Final  . Anion Gap 06/22/2016 7  3 - 11 mEq/L Final  . EGFR 06/22/2016 68* >90 ml/min/1.73 m2 Final    RADIOGRAPHIC STUDIES: No results found.  ASSESSMENT/PLAN:    Rash- likely drug induced. - instructed to stop Revlimid and take Benadryl and Pepcid today and tomorrow. She is to call office in 24 hours if rash is not improved. Will follow up in 1  week with Dr. Alvy Bimler. Cough -  Likely viral - instructed to call us if she begins to run a fever. States she has been afebrile and has no other symptoms. Constipation - encouraged  to increase stool softeners (she takes Dulcolax and Miralax) if needed to allow her to pass stool without straining at least once very 48 hours Weight loss -  She has lost 8 lbs in one week. She states food doesn't taste good and she has been only drinking one protein shake (160 calories) a day. Strongly encouraged her to drink more shakes, make smoothies or milkshakes, and increase her calories to prevent further rapid weight loss. She voiced understanding.   Patient stated understanding of all instructions; and was in agreement with this plan of care. The patient knows to call the clinic with any problems, questions or concerns.   Total time spent with patient was 30 minutes;  with greater than 90 percent of that time spent in face to face counseling regarding patient's symptoms,  and coordination of care and follow up.  Disclaimer:This dictation was prepared with Dragon/digital dictation along with [employer]. Any transcriptional errors that result from this process are unintentional.  Bill Salinas, NP,AOCNP 06/28/2016

## 2016-06-28 NOTE — Telephone Encounter (Signed)
Appointments scheduled per 2/12 LOS and 2/11 in-basket message. Patient given AVS report and calendars with future scheduled appointments. Patient given instructions with AVS report.

## 2016-06-29 ENCOUNTER — Other Ambulatory Visit (HOSPITAL_BASED_OUTPATIENT_CLINIC_OR_DEPARTMENT_OTHER): Payer: BC Managed Care – PPO

## 2016-06-29 ENCOUNTER — Ambulatory Visit (HOSPITAL_BASED_OUTPATIENT_CLINIC_OR_DEPARTMENT_OTHER): Payer: BC Managed Care – PPO

## 2016-06-29 VITALS — BP 112/58 | HR 76 | Temp 99.1°F | Resp 18

## 2016-06-29 DIAGNOSIS — C9000 Multiple myeloma not having achieved remission: Secondary | ICD-10-CM | POA: Diagnosis not present

## 2016-06-29 DIAGNOSIS — Z5112 Encounter for antineoplastic immunotherapy: Secondary | ICD-10-CM | POA: Diagnosis not present

## 2016-06-29 LAB — COMPREHENSIVE METABOLIC PANEL
ALT: 18 U/L (ref 0–55)
AST: 18 U/L (ref 5–34)
Albumin: 3.2 g/dL — ABNORMAL LOW (ref 3.5–5.0)
Alkaline Phosphatase: 71 U/L (ref 40–150)
Anion Gap: 9 mEq/L (ref 3–11)
BUN: 11.6 mg/dL (ref 7.0–26.0)
CO2: 28 mEq/L (ref 22–29)
Calcium: 9.1 mg/dL (ref 8.4–10.4)
Chloride: 100 mEq/L (ref 98–109)
Creatinine: 1 mg/dL (ref 0.6–1.1)
EGFR: 69 mL/min/{1.73_m2} — ABNORMAL LOW (ref >90)
Glucose: 89 mg/dl (ref 70–140)
Potassium: 3.6 mEq/L (ref 3.5–5.1)
Sodium: 137 mEq/L (ref 136–145)
Total Bilirubin: 0.78 mg/dL (ref 0.20–1.20)
Total Protein: 6.7 g/dL (ref 6.4–8.3)

## 2016-06-29 LAB — CBC WITH DIFFERENTIAL/PLATELET
BASO%: 0.2 % (ref 0.0–2.0)
Basophils Absolute: 0 10*3/uL (ref 0.0–0.1)
EOS%: 7.6 % — ABNORMAL HIGH (ref 0.0–7.0)
Eosinophils Absolute: 0.4 10*3/uL (ref 0.0–0.5)
HCT: 32.4 % — ABNORMAL LOW (ref 34.8–46.6)
HGB: 10.4 g/dL — ABNORMAL LOW (ref 11.6–15.9)
LYMPH%: 15.2 % (ref 14.0–49.7)
MCH: 29.9 pg (ref 25.1–34.0)
MCHC: 32.1 g/dL (ref 31.5–36.0)
MCV: 93.1 fL (ref 79.5–101.0)
MONO#: 0.3 10*3/uL (ref 0.1–0.9)
MONO%: 7 % (ref 0.0–14.0)
NEUT#: 3.2 10*3/uL (ref 1.5–6.5)
NEUT%: 70 % (ref 38.4–76.8)
Platelets: 184 10*3/uL (ref 145–400)
RBC: 3.48 10*6/uL — ABNORMAL LOW (ref 3.70–5.45)
RDW: 16.1 % — ABNORMAL HIGH (ref 11.2–14.5)
WBC: 4.6 10*3/uL (ref 3.9–10.3)
lymph#: 0.7 10*3/uL — ABNORMAL LOW (ref 0.9–3.3)

## 2016-06-29 MED ORDER — BORTEZOMIB CHEMO SQ INJECTION 3.5 MG (2.5MG/ML)
1.3000 mg/m2 | Freq: Once | INTRAMUSCULAR | Status: AC
  Administered 2016-06-29: 3.25 mg via SUBCUTANEOUS
  Filled 2016-06-29: qty 3.25

## 2016-06-29 MED ORDER — PROCHLORPERAZINE MALEATE 10 MG PO TABS
ORAL_TABLET | ORAL | Status: AC
  Filled 2016-06-29: qty 1

## 2016-06-29 MED ORDER — PROCHLORPERAZINE MALEATE 10 MG PO TABS
10.0000 mg | ORAL_TABLET | Freq: Once | ORAL | Status: AC
  Administered 2016-06-29: 10 mg via ORAL

## 2016-06-29 NOTE — Patient Instructions (Signed)
[facility] Discharge Instructions for Patients Receiving Chemotherapy  Today you received the following chemotherapy agents Velcade. To help prevent nausea and vomiting after your treatment, we encourage you to take your nausea medication as directed.  If you develop nausea and vomiting that is not controlled by your nausea medication, call the clinic.   BELOW ARE SYMPTOMS THAT SHOULD BE REPORTED IMMEDIATELY:  *FEVER GREATER THAN 100.5 F  *CHILLS WITH OR WITHOUT FEVER  NAUSEA AND VOMITING THAT IS NOT CONTROLLED WITH YOUR NAUSEA MEDICATION  *UNUSUAL SHORTNESS OF BREATH  *UNUSUAL BRUISING OR BLEEDING  TENDERNESS IN MOUTH AND THROAT WITH OR WITHOUT PRESENCE OF ULCERS  *URINARY PROBLEMS  *BOWEL PROBLEMS  UNUSUAL RASH Items with * indicate a potential emergency and should be followed up as soon as possible.  Feel free to call the clinic you have any questions or concerns. The clinic phone number is [phone].  Please show the CHEMO ALERT CARD at check-in to the Emergency Department and triage nurse.    

## 2016-06-30 ENCOUNTER — Telehealth: Payer: Self-pay | Admitting: *Deleted

## 2016-06-30 ENCOUNTER — Telehealth: Payer: Self-pay

## 2016-06-30 ENCOUNTER — Telehealth: Payer: Self-pay | Admitting: Hematology and Oncology

## 2016-06-30 NOTE — Telephone Encounter (Signed)
Received VM message from patient stating that the rash that is on her hands and feet has spread now to her chest.  She was seen in the Symptom Management Clinic on Monday, 06/28/16. She was prescribed benadryl/pepcid. Pt states this has not helped at all. She is asking what she should do now.  Please advise.

## 2016-06-30 NOTE — Telephone Encounter (Signed)
Called patient to inform her of next scheduled appointments. Mailed out schedule as well.

## 2016-06-30 NOTE — Telephone Encounter (Signed)
Please get scheduler to add her on to be seen on Friday at 2 PM. Tell her to take dexamethasone 1 pill daily

## 2016-06-30 NOTE — Telephone Encounter (Signed)
TC to patient and advised her to take Dexamethasone 4 mg tab daily for progressive rash per Dr. Alvy Bimler. Also advised her that Dr. Alvy Bimler will see her Friday @ 2pm. Pt voiced understanding.

## 2016-06-30 NOTE — Telephone Encounter (Signed)
Called and instructed per Dr. Alvy Bimler take Dexamethasone 1 tablet daily and she has appointment with Dr. Alvy Bimler this at Friday at 2 pm.

## 2016-07-02 ENCOUNTER — Ambulatory Visit (HOSPITAL_BASED_OUTPATIENT_CLINIC_OR_DEPARTMENT_OTHER): Payer: BC Managed Care – PPO | Admitting: Hematology and Oncology

## 2016-07-02 ENCOUNTER — Ambulatory Visit (HOSPITAL_BASED_OUTPATIENT_CLINIC_OR_DEPARTMENT_OTHER): Payer: BC Managed Care – PPO

## 2016-07-02 ENCOUNTER — Encounter: Payer: Self-pay | Admitting: Hematology and Oncology

## 2016-07-02 DIAGNOSIS — C9000 Multiple myeloma not having achieved remission: Secondary | ICD-10-CM

## 2016-07-02 DIAGNOSIS — L239 Allergic contact dermatitis, unspecified cause: Secondary | ICD-10-CM | POA: Diagnosis not present

## 2016-07-02 DIAGNOSIS — J302 Other seasonal allergic rhinitis: Secondary | ICD-10-CM

## 2016-07-02 DIAGNOSIS — R21 Rash and other nonspecific skin eruption: Secondary | ICD-10-CM

## 2016-07-02 DIAGNOSIS — T451X5A Adverse effect of antineoplastic and immunosuppressive drugs, initial encounter: Secondary | ICD-10-CM

## 2016-07-02 DIAGNOSIS — C9001 Multiple myeloma in remission: Secondary | ICD-10-CM

## 2016-07-02 DIAGNOSIS — D6481 Anemia due to antineoplastic chemotherapy: Secondary | ICD-10-CM | POA: Diagnosis not present

## 2016-07-02 DIAGNOSIS — T7840XA Allergy, unspecified, initial encounter: Secondary | ICD-10-CM | POA: Insufficient documentation

## 2016-07-02 DIAGNOSIS — J309 Allergic rhinitis, unspecified: Secondary | ICD-10-CM | POA: Insufficient documentation

## 2016-07-02 DIAGNOSIS — Z5112 Encounter for antineoplastic immunotherapy: Secondary | ICD-10-CM | POA: Diagnosis not present

## 2016-07-02 MED ORDER — PROCHLORPERAZINE MALEATE 10 MG PO TABS
10.0000 mg | ORAL_TABLET | Freq: Once | ORAL | Status: AC
  Administered 2016-07-02: 10 mg via ORAL

## 2016-07-02 MED ORDER — TRIAMCINOLONE ACETONIDE 55 MCG/ACT NA AERO: 2.0000 | INHALATION_SPRAY | Freq: Every day | NASAL | 12 refills | Status: DC

## 2016-07-02 MED ORDER — PROCHLORPERAZINE MALEATE 10 MG PO TABS
ORAL_TABLET | ORAL | Status: AC
  Filled 2016-07-02: qty 1

## 2016-07-02 MED ORDER — BORTEZOMIB CHEMO SQ INJECTION 3.5 MG (2.5MG/ML)
1.3000 mg/m2 | Freq: Once | INTRAMUSCULAR | Status: AC
  Administered 2016-07-02: 3.25 mg via SUBCUTANEOUS
  Filled 2016-07-02: qty 3.25

## 2016-07-02 NOTE — Progress Notes (Signed)
[place] OFFICE PROGRESS NOTE  Patient Care Team: Lucianne Lei, MD as PCP - General (Family Medicine)  SUMMARY OF ONCOLOGIC HISTORY:   Multiple myeloma ([place])   03/22/2016 Bone Marrow Biopsy    Bone Marrow Biopsy: Plasma cells 17% by Aspirate and 30% by CD 138 stain. Plasma cell neoplasm, Kappa Restricted Normal cytogenetics, FISH positive for +11 and +14 and +7      04/13/2016 Imaging    Skeletal survey showed lucencies within the calvarium worrisome for myeloma. No definite abnormal lytic or blastic lesions are observed elsewhere.      04/19/2016 -  Chemotherapy    The patient consented to treatment with Revlimid, dexamethasone and Velcade       INTERVAL HISTORY: Please see below for problem oriented charting. She is seen today urgently due to significant breakout of rash.  This has happened several days ago. She also has symptoms of allergic rhinitis. She denies recent diarrhea, fever, sore throat or cough. The skin rash is extremely itchy.  REVIEW OF SYSTEMS:   Constitutional: Denies fevers, chills or abnormal weight loss Eyes: Denies blurriness of vision Ears, nose, mouth, throat, and face: Denies mucositis or sore throat Respiratory: Denies cough, dyspnea or wheezes Cardiovascular: Denies palpitation, chest discomfort or lower extremity swelling Gastrointestinal:  Denies nausea, heartburn or change in bowel habits Lymphatics: Denies new lymphadenopathy or easy bruising Neurological:Denies numbness, tingling or new weaknesses Behavioral/Psych: Mood is stable, no new changes  All other systems were reviewed with the patient and are negative.  I have reviewed the past medical history, past surgical history, social history and family history with the patient and they are unchanged from previous note.  ALLERGIES:  has No Known Allergies.  MEDICATIONS:  Current Outpatient Prescriptions  Medication Sig Dispense Refill  . acyclovir (ZOVIRAX) 400 MG tablet  Take 1 tablet (400 mg total) by mouth 2 (two) times daily. 60 tablet 3  . ASPIRIN 81 PO Take 81 mg by mouth daily.    . bisacodyl (BISACODYL) 5 MG EC tablet Take 5 mg by mouth daily as needed for moderate constipation.    . Cholecalciferol (VITAMIN D3 PO) Take 5,000 Int'l Units by mouth daily.    . Cyanocobalamin (VITAMIN B-12 PO) Take 5,000 mcg by mouth 2 (two) times daily.    Marland Kitchen dexamethasone (DECADRON) 4 MG tablet Take 10 tablets (40 mg) weekly 30 tablet 3  . FIBER PO Take by mouth daily.    Marland Kitchen lenalidomide (REVLIMID) 10 MG capsule TAKE 1 CAPSULE (10MG) BY MOUTH ONCE DAILY FOR 14 DAYS ON THEN 7 DAYS OFF. 14 capsule 0  . losartan-hydrochlorothiazide (HYZAAR) 100-25 MG tablet Take 1 tablet by mouth daily. 90 tablet 3  . Multiple Vitamin (MULTIVITAMIN) tablet Take 1 tablet by mouth daily.    . Omega-3 Fatty Acids (OMEGA-3 FISH OIL PO) Take by mouth daily.    . Sodium Fluoride 0.15 % PSTE Use as directed in the mouth or throat.    . ondansetron (ZOFRAN) 8 MG tablet Take 1 tablet (8 mg total) by mouth 2 (two) times daily as needed (Nausea or vomiting). (Patient not taking: Reported on 06/22/2016) 30 tablet 1  . prochlorperazine (COMPAZINE) 10 MG tablet Take 1 tablet (10 mg total) by mouth every 6 (six) hours as needed (Nausea or vomiting). (Patient not taking: Reported on 06/22/2016) 30 tablet 1  . triamcinolone (NASACORT) 55 MCG/ACT AERO nasal inhaler Place 2 sprays into the nose daily. 1 Inhaler 12   No current facility-administered medications  for this visit.     PHYSICAL EXAMINATION: ECOG PERFORMANCE STATUS: 1 - Symptomatic but completely ambulatory  Vitals:   07/02/16 1339  BP: 121/69  Pulse: 66  Resp: 18  Temp: 98.1 F (36.7 C)   Filed Weights   07/02/16 1339  Weight: 286 lb 4.8 oz (129.9 kg)    GENERAL:alert, no distress and comfortable SKIN: Noted significant skin rash, likely drug-induced.   EYES: normal, Conjunctiva are pink and non-injected, sclera clear OROPHARYNX:no  exudate, no erythema and lips, buccal mucosa, and tongue normal  NECK: supple, thyroid normal size, non-tender, without nodularity LYMPH:  no palpable lymphadenopathy in the cervical, axillary or inguinal LUNGS: clear to auscultation and percussion with normal breathing effort HEART: regular rate & rhythm and no murmurs and no lower extremity edema ABDOMEN:abdomen soft, non-tender and normal bowel sounds Musculoskeletal:no cyanosis of digits and no clubbing  NEURO: alert & oriented x 3 with fluent speech, no focal motor/sensory deficits  LABORATORY DATA:  I have reviewed the data as listed    Component Value Date/Time   NA 137 06/29/2016 1436   K 3.6 06/29/2016 1436   CO2 28 06/29/2016 1436   GLUCOSE 89 06/29/2016 1436   BUN 11.6 06/29/2016 1436   CREATININE 1.0 06/29/2016 1436   CALCIUM 9.1 06/29/2016 1436   PROT 6.7 06/29/2016 1436   ALBUMIN 3.2 (L) 06/29/2016 1436   AST 18 06/29/2016 1436   ALT 18 06/29/2016 1436   ALKPHOS 71 06/29/2016 1436   BILITOT 0.78 06/29/2016 1436    No results found for: SPEP, UPEP  Lab Results  Component Value Date   WBC 4.6 06/29/2016   NEUTROABS 3.2 06/29/2016   HGB 10.4 (L) 06/29/2016   HCT 32.4 (L) 06/29/2016   MCV 93.1 06/29/2016   PLT 184 06/29/2016      Chemistry      Component Value Date/Time   NA 137 06/29/2016 1436   K 3.6 06/29/2016 1436   CO2 28 06/29/2016 1436   BUN 11.6 06/29/2016 1436   CREATININE 1.0 06/29/2016 1436      Component Value Date/Time   CALCIUM 9.1 06/29/2016 1436   ALKPHOS 71 06/29/2016 1436   AST 18 06/29/2016 1436   ALT 18 06/29/2016 1436   BILITOT 0.78 06/29/2016 1436             ASSESSMENT & PLAN:  Multiple myeloma ([place]) I reviewed recent treatment recommendations from her oncologist at [facility]. The patient desired to delay transplant until around May 2018. I plan to order several more cycles of treatment with Revlimid, dexamethasone and  Velcade. Unfortunately, she had significant signs of allergies recently along with significant rash, which could be attributed to Revlimid. Her Revlimid is placed on hold. I plan to see her back again in 12 days to assess how she does with additional dose of dexamethasone. She will complete 1 more dose of Velcade today. She will continue calcium with vitamin D supplement and Zometa every 3 months, next due around March 2018.  Allergic rhinitis She has recurrent rhinitis, with component of allergies. I recommended Nasacort inhaler along with daily dexamethasone at 4 mg by mouth for 1 week.  Rash due to allergy She has significant skin rash due to allergic dermatitis. Certainly, Revlimid could be responsible for this. I recommend she hold his Revlimid. Recommend she takes 4 mg of dexamethasone daily for 1 week and then continue her efforts of dexamethasone taper as instructed. I will reassess when I see  her back in 12 days.  Anemia due to antineoplastic chemotherapy This is likely due to recent treatment. The patient denies recent history of bleeding such as epistaxis, hematuria or hematochezia. She is asymptomatic from the anemia. I will observe for now.   No orders of the defined types were placed in this encounter.  All questions were answered. The patient knows to call the clinic with any problems, questions or concerns. No barriers to learning was detected. I spent 25 minutes counseling the patient face to face. The total time spent in the appointment was 40 minutes and more than 50% was on counseling and review of test results     Heath Lark, MD 07/02/2016 2:44 PM

## 2016-07-02 NOTE — Assessment & Plan Note (Signed)
I reviewed recent treatment recommendations from her oncologist at [facility]. The patient desired to delay transplant until around May 2018. I plan to order several more cycles of treatment with Revlimid, dexamethasone and Velcade. Unfortunately, she had significant signs of allergies recently along with significant rash, which could be attributed to Revlimid. Her Revlimid is placed on hold. I plan to see her back again in 12 days to assess how she does with additional dose of dexamethasone. She will complete 1 more dose of Velcade today. She will continue calcium with vitamin D supplement and Zometa every 3 months, next due around March 2018.

## 2016-07-02 NOTE — Patient Instructions (Signed)
[facility] Discharge Instructions for Patients Receiving Chemotherapy  Today you received the following chemotherapy agents Velcade. To help prevent nausea and vomiting after your treatment, we encourage you to take your nausea medication as directed.  If you develop nausea and vomiting that is not controlled by your nausea medication, call the clinic.   BELOW ARE SYMPTOMS THAT SHOULD BE REPORTED IMMEDIATELY:  *FEVER GREATER THAN 100.5 F  *CHILLS WITH OR WITHOUT FEVER  NAUSEA AND VOMITING THAT IS NOT CONTROLLED WITH YOUR NAUSEA MEDICATION  *UNUSUAL SHORTNESS OF BREATH  *UNUSUAL BRUISING OR BLEEDING  TENDERNESS IN MOUTH AND THROAT WITH OR WITHOUT PRESENCE OF ULCERS  *URINARY PROBLEMS  *BOWEL PROBLEMS  UNUSUAL RASH Items with * indicate a potential emergency and should be followed up as soon as possible.  Feel free to call the clinic you have any questions or concerns. The clinic phone number is [phone].  Please show the CHEMO ALERT CARD at check-in to the Emergency Department and triage nurse.    

## 2016-07-02 NOTE — Assessment & Plan Note (Signed)
This is likely due to recent treatment. The patient denies recent history of bleeding such as epistaxis, hematuria or hematochezia. She is asymptomatic from the anemia. I will observe for now.   

## 2016-07-02 NOTE — Assessment & Plan Note (Signed)
She has significant skin rash due to allergic dermatitis. Certainly, Revlimid could be responsible for this. I recommend she hold his Revlimid. Recommend she takes 4 mg of dexamethasone daily for 1 week and then continue her efforts of dexamethasone taper as instructed. I will reassess when I see her back in 12 days.

## 2016-07-02 NOTE — Assessment & Plan Note (Signed)
She has recurrent rhinitis, with component of allergies. I recommended Nasacort inhaler along with daily dexamethasone at 4 mg by mouth for 1 week.

## 2016-07-09 ENCOUNTER — Other Ambulatory Visit: Payer: Self-pay | Admitting: *Deleted

## 2016-07-09 MED ORDER — AMOXICILLIN 500 MG PO CAPS: 500.0000 mg | ORAL_CAPSULE | Freq: Two times a day (BID) | ORAL | 0 refills | Status: DC

## 2016-07-09 NOTE — Telephone Encounter (Signed)
Pt states she has had nasal and chest congestion X 2 days. Coughing thick sputum, having pain with coughing.  Notified to take Amoxicillin 500 mg BID X 7 days.

## 2016-07-13 ENCOUNTER — Telehealth: Payer: Self-pay | Admitting: Hematology and Oncology

## 2016-07-13 ENCOUNTER — Encounter: Payer: Self-pay | Admitting: Hematology and Oncology

## 2016-07-13 ENCOUNTER — Ambulatory Visit (HOSPITAL_BASED_OUTPATIENT_CLINIC_OR_DEPARTMENT_OTHER): Payer: BC Managed Care – PPO

## 2016-07-13 ENCOUNTER — Ambulatory Visit (HOSPITAL_BASED_OUTPATIENT_CLINIC_OR_DEPARTMENT_OTHER): Payer: BC Managed Care – PPO | Admitting: Hematology and Oncology

## 2016-07-13 ENCOUNTER — Other Ambulatory Visit (HOSPITAL_BASED_OUTPATIENT_CLINIC_OR_DEPARTMENT_OTHER): Payer: BC Managed Care – PPO

## 2016-07-13 VITALS — BP 110/59 | HR 60 | Temp 97.6°F | Resp 18 | Ht 69.0 in | Wt 283.8 lb

## 2016-07-13 DIAGNOSIS — G620 Drug-induced polyneuropathy: Secondary | ICD-10-CM | POA: Diagnosis not present

## 2016-07-13 DIAGNOSIS — R5381 Other malaise: Secondary | ICD-10-CM | POA: Diagnosis not present

## 2016-07-13 DIAGNOSIS — D6481 Anemia due to antineoplastic chemotherapy: Secondary | ICD-10-CM

## 2016-07-13 DIAGNOSIS — C9000 Multiple myeloma not having achieved remission: Secondary | ICD-10-CM | POA: Diagnosis not present

## 2016-07-13 DIAGNOSIS — Z5112 Encounter for antineoplastic immunotherapy: Secondary | ICD-10-CM

## 2016-07-13 DIAGNOSIS — C9001 Multiple myeloma in remission: Secondary | ICD-10-CM

## 2016-07-13 DIAGNOSIS — L270 Generalized skin eruption due to drugs and medicaments taken internally: Secondary | ICD-10-CM | POA: Diagnosis not present

## 2016-07-13 DIAGNOSIS — D649 Anemia, unspecified: Secondary | ICD-10-CM

## 2016-07-13 DIAGNOSIS — T451X5A Adverse effect of antineoplastic and immunosuppressive drugs, initial encounter: Secondary | ICD-10-CM | POA: Insufficient documentation

## 2016-07-13 LAB — COMPREHENSIVE METABOLIC PANEL
ALT: 14 U/L (ref 0–55)
AST: 15 U/L (ref 5–34)
Albumin: 3.3 g/dL — ABNORMAL LOW (ref 3.5–5.0)
Alkaline Phosphatase: 68 U/L (ref 40–150)
Anion Gap: 6 mEq/L (ref 3–11)
BUN: 14.7 mg/dL (ref 7.0–26.0)
CO2: 31 mEq/L — ABNORMAL HIGH (ref 22–29)
Calcium: 9.6 mg/dL (ref 8.4–10.4)
Chloride: 102 mEq/L (ref 98–109)
Creatinine: 1.1 mg/dL (ref 0.6–1.1)
EGFR: 61 mL/min/{1.73_m2} — ABNORMAL LOW (ref >90)
Glucose: 97 mg/dl (ref 70–140)
Potassium: 4.1 mEq/L (ref 3.5–5.1)
Sodium: 139 mEq/L (ref 136–145)
Total Bilirubin: 0.63 mg/dL (ref 0.20–1.20)
Total Protein: 7.1 g/dL (ref 6.4–8.3)

## 2016-07-13 LAB — CBC WITH DIFFERENTIAL/PLATELET
BASO%: 0.6 % (ref 0.0–2.0)
Basophils Absolute: 0 10*3/uL (ref 0.0–0.1)
EOS%: 3.5 % (ref 0.0–7.0)
Eosinophils Absolute: 0.2 10*3/uL (ref 0.0–0.5)
HCT: 32.8 % — ABNORMAL LOW (ref 34.8–46.6)
HGB: 10.4 g/dL — ABNORMAL LOW (ref 11.6–15.9)
LYMPH%: 35.3 % (ref 14.0–49.7)
MCH: 29.4 pg (ref 25.1–34.0)
MCHC: 31.7 g/dL (ref 31.5–36.0)
MCV: 92.7 fL (ref 79.5–101.0)
MONO#: 0.6 10*3/uL (ref 0.1–0.9)
MONO%: 9.1 % (ref 0.0–14.0)
NEUT#: 3.2 10*3/uL (ref 1.5–6.5)
NEUT%: 51.5 % (ref 38.4–76.8)
Platelets: 328 10*3/uL (ref 145–400)
RBC: 3.54 10*6/uL — ABNORMAL LOW (ref 3.70–5.45)
RDW: 15.8 % — ABNORMAL HIGH (ref 11.2–14.5)
WBC: 6.3 10*3/uL (ref 3.9–10.3)
lymph#: 2.2 10*3/uL (ref 0.9–3.3)

## 2016-07-13 MED ORDER — PROCHLORPERAZINE MALEATE 10 MG PO TABS
10.0000 mg | ORAL_TABLET | Freq: Once | ORAL | Status: AC
  Administered 2016-07-13: 10 mg via ORAL

## 2016-07-13 MED ORDER — PROCHLORPERAZINE MALEATE 10 MG PO TABS
ORAL_TABLET | ORAL | Status: AC
  Filled 2016-07-13: qty 1

## 2016-07-13 MED ORDER — BORTEZOMIB CHEMO SQ INJECTION 3.5 MG (2.5MG/ML)
1.0000 mg/m2 | Freq: Once | INTRAMUSCULAR | Status: AC
  Administered 2016-07-13: 2.5 mg via SUBCUTANEOUS
  Filled 2016-07-13: qty 2.5

## 2016-07-13 MED ORDER — GABAPENTIN 300 MG PO CAPS: 300.0000 mg | ORAL_CAPSULE | Freq: Three times a day (TID) | ORAL | 9 refills | Status: DC

## 2016-07-13 NOTE — Progress Notes (Signed)
[facility] OFFICE PROGRESS NOTE  Patient Care Team: Lucianne Lei, MD as PCP - General (Family Medicine)  SUMMARY OF ONCOLOGIC HISTORY:   Multiple myeloma ([place])   03/22/2016 Bone Marrow Biopsy    Bone Marrow Biopsy: Plasma cells 17% by Aspirate and 30% by CD 138 stain. Plasma cell neoplasm, Kappa Restricted Normal cytogenetics, FISH positive for +11 and +14 and +7      04/13/2016 Imaging    Skeletal survey showed lucencies within the calvarium worrisome for myeloma. No definite abnormal lytic or blastic lesions are observed elsewhere.      04/19/2016 -  Chemotherapy    The patient consented to treatment with Revlimid, dexamethasone and Velcade       INTERVAL HISTORY: Please see below for problem oriented charting. She returns for further follow-up. She had recent upper respiratory congestion, improving on amoxicillin. She complained of peripheral neuropathy affecting her fingers and feet.  It is occasionally painful. She denies recent worsening skin rash.  It is improving on steroid treatment. She denies recent nausea or constipation.  REVIEW OF SYSTEMS:   Constitutional: Denies fevers, chills or abnormal weight loss Eyes: Denies blurriness of vision Ears, nose, mouth, throat, and face: Denies mucositis or sore throat Respiratory: Denies cough, dyspnea or wheezes Cardiovascular: Denies palpitation, chest discomfort or lower extremity swelling Gastrointestinal:  Denies nausea, heartburn or change in bowel habits Skin: Denies abnormal skin rashes Lymphatics: Denies new lymphadenopathy or easy bruising Neurological:Denies numbness, tingling or new weaknesses Behavioral/Psych: Mood is stable, no new changes  All other systems were reviewed with the patient and are negative.  I have reviewed the past medical history, past surgical history, social history and family history with the patient and they are unchanged from previous note.  ALLERGIES:  has No Known  Allergies.  MEDICATIONS:  Current Outpatient Prescriptions  Medication Sig Dispense Refill  . acyclovir (ZOVIRAX) 400 MG tablet Take 1 tablet (400 mg total) by mouth 2 (two) times daily. 60 tablet 3  . amoxicillin (AMOXIL) 500 MG capsule Take 1 capsule (500 mg total) by mouth 2 (two) times daily. 14 capsule 0  . ASPIRIN 81 PO Take 81 mg by mouth daily.    . bisacodyl (BISACODYL) 5 MG EC tablet Take 5 mg by mouth daily as needed for moderate constipation.    . Cholecalciferol (VITAMIN D3 PO) Take 5,000 Int'l Units by mouth daily.    . Cyanocobalamin (VITAMIN B-12 PO) Take 5,000 mcg by mouth 2 (two) times daily.    Marland Kitchen dexamethasone (DECADRON) 4 MG tablet Take 10 tablets (40 mg) weekly 30 tablet 3  . FIBER PO Take by mouth daily.    Marland Kitchen gabapentin (NEURONTIN) 300 MG capsule Take 1 capsule (300 mg total) by mouth 3 (three) times daily. 90 capsule 9  . lenalidomide (REVLIMID) 10 MG capsule TAKE 1 CAPSULE (10MG) BY MOUTH ONCE DAILY FOR 14 DAYS ON THEN 7 DAYS OFF. 14 capsule 0  . losartan-hydrochlorothiazide (HYZAAR) 100-25 MG tablet Take 1 tablet by mouth daily. 90 tablet 3  . Multiple Vitamin (MULTIVITAMIN) tablet Take 1 tablet by mouth daily.    . Omega-3 Fatty Acids (OMEGA-3 FISH OIL PO) Take by mouth daily.    . ondansetron (ZOFRAN) 8 MG tablet Take 1 tablet (8 mg total) by mouth 2 (two) times daily as needed (Nausea or vomiting). (Patient not taking: Reported on 06/22/2016) 30 tablet 1  . prochlorperazine (COMPAZINE) 10 MG tablet Take 1 tablet (10 mg total) by mouth every 6 (six)  hours as needed (Nausea or vomiting). (Patient not taking: Reported on 06/22/2016) 30 tablet 1  . Sodium Fluoride 0.15 % PSTE Use as directed in the mouth or throat.    . triamcinolone (NASACORT) 55 MCG/ACT AERO nasal inhaler Place 2 sprays into the nose daily. 1 Inhaler 12   No current facility-administered medications for this visit.     PHYSICAL EXAMINATION: ECOG PERFORMANCE STATUS: 1 - Symptomatic but completely  ambulatory  Vitals:   07/13/16 1419  BP: (!) 110/59  Pulse: 60  Resp: 18  Temp: 97.6 F (36.4 C)   Filed Weights   07/13/16 1419  Weight: 283 lb 12.8 oz (128.7 kg)    GENERAL:alert, no distress and comfortable SKIN: skin color, texture, turgor are normal, no rashes or significant lesions.  Previous skin rash is improving EYES: normal, Conjunctiva are pink and non-injected, sclera clear OROPHARYNX:no exudate, no erythema and lips, buccal mucosa, and tongue normal  NECK: supple, thyroid normal size, non-tender, without nodularity LYMPH:  no palpable lymphadenopathy in the cervical, axillary or inguinal LUNGS: clear to auscultation and percussion with normal breathing effort HEART: regular rate & rhythm and no murmurs and no lower extremity edema ABDOMEN:abdomen soft, non-tender and normal bowel sounds Musculoskeletal:no cyanosis of digits and no clubbing  NEURO: alert & oriented x 3 with fluent speech, no focal motor/sensory deficits  LABORATORY DATA:  I have reviewed the data as listed    Component Value Date/Time   NA 139 07/13/2016 1410   K 4.1 07/13/2016 1410   CO2 31 (H) 07/13/2016 1410   GLUCOSE 97 07/13/2016 1410   BUN 14.7 07/13/2016 1410   CREATININE 1.1 07/13/2016 1410   CALCIUM 9.6 07/13/2016 1410   PROT 7.1 07/13/2016 1410   ALBUMIN 3.3 (L) 07/13/2016 1410   AST 15 07/13/2016 1410   ALT 14 07/13/2016 1410   ALKPHOS 68 07/13/2016 1410   BILITOT 0.63 07/13/2016 1410    No results found for: SPEP, UPEP  Lab Results  Component Value Date   WBC 6.3 07/13/2016   NEUTROABS 3.2 07/13/2016   HGB 10.4 (L) 07/13/2016   HCT 32.8 (L) 07/13/2016   MCV 92.7 07/13/2016   PLT 328 07/13/2016      Chemistry      Component Value Date/Time   NA 139 07/13/2016 1410   K 4.1 07/13/2016 1410   CO2 31 (H) 07/13/2016 1410   BUN 14.7 07/13/2016 1410   CREATININE 1.1 07/13/2016 1410      Component Value Date/Time   CALCIUM 9.6 07/13/2016 1410   ALKPHOS 68 07/13/2016  1410   AST 15 07/13/2016 1410   ALT 14 07/13/2016 1410   BILITOT 0.63 07/13/2016 1410       ASSESSMENT & PLAN:  Multiple myeloma ([place]) I reviewed recent treatment recommendations from her oncologist at [facility]. The patient desired to delay transplant until around May 2018. I plan to order several more cycles of treatment with Revlimid, dexamethasone and Velcade. Unfortunately, she had significant signs of allergies recently along with significant rash, which could be attributed to Revlimid. Her Revlimid is placed on hold.  The rash is improved I plan to see her back again in about 2 weeks to assess how she does  before resuming Revlimid  She has signs of peripheral neuropathy.  I plan to reduce Velcade to 1 mg/m to be given on days 1, 8, 15 and rest day 22 She will continue calcium with vitamin D supplement and Zometa every 3  months, next due around March 2018. She will take dexamethasone 8 mg on Tuesdays only  Peripheral neuropathy due to chemotherapy [facility]) she has mild peripheral neuropathy, likely related to side effects of treatment. I plan to reduce the dose of treatment as outlined above.  I explained to the patient the rationale of this strategy and reassured the patient it would not compromise the efficacy of treatment I would also recommend starting her on gabapentin. We discussed potential risk of nausea, constipation and sedation with gabapentin and she agreed to proceed   Anemia due to antineoplastic chemotherapy This is likely due to recent treatment. The patient denies recent history of bleeding such as epistaxis, hematuria or hematochezia. She is asymptomatic from the anemia. I will observe for now.  Drug-induced skin rash She had significant skin rash, likely triggered by recent viral illness and possibly from Revlimid.  It is improving. I will reck to hold off 2 more weeks before resuming Revlimid.  Physical debility She has generalized  deconditioning due to side effects from treatment. She had recent infection and now has side-effects from peripheral neuropathy. I spent some time filling up her disability application from her insurance.   Orders Placed This Encounter  Procedures  . Kappa/lambda light chains    Standing Status:   Future    Standing Expiration Date:   08/17/2017  . Multiple Myeloma Panel (SPEP&IFE w/QIG)    Standing Status:   Future    Standing Expiration Date:   08/17/2017   All questions were answered. The patient knows to call the clinic with any problems, questions or concerns. No barriers to learning was detected. I spent 30 minutes counseling the patient face to face. The total time spent in the appointment was 40 minutes and more than 50% was on counseling and review of test results     Heath Lark, MD 07/13/2016 4:14 PM

## 2016-07-13 NOTE — Assessment & Plan Note (Signed)
She had significant skin rash, likely triggered by recent viral illness and possibly from Revlimid.  It is improving. I will reck to hold off 2 more weeks before resuming Revlimid.

## 2016-07-13 NOTE — Assessment & Plan Note (Addendum)
I reviewed recent treatment recommendations from her oncologist at [facility]. The patient desired to delay transplant until around May 2018. I plan to order several more cycles of treatment with Revlimid, dexamethasone and Velcade. Unfortunately, she had significant signs of allergies recently along with significant rash, which could be attributed to Revlimid. Her Revlimid is placed on hold.  The rash is improved I plan to see her back again in about 2 weeks to assess how she does  before resuming Revlimid  She has signs of peripheral neuropathy.  I plan to reduce Velcade to 1 mg/m to be given on days 1, 8, 15 and rest day 22 She will continue calcium with vitamin D supplement and Zometa every 3 months, next due around March 2018. She will take dexamethasone 8 mg on Tuesdays only

## 2016-07-13 NOTE — Assessment & Plan Note (Signed)
This is likely due to recent treatment. The patient denies recent history of bleeding such as epistaxis, hematuria or hematochezia. She is asymptomatic from the anemia. I will observe for now.   

## 2016-07-13 NOTE — Telephone Encounter (Signed)
Appts scheduled per 07/13/2016 los Patient to be given new schedule after treatment.

## 2016-07-13 NOTE — Assessment & Plan Note (Signed)
she has mild peripheral neuropathy, likely related to side effects of treatment. I plan to reduce the dose of treatment as outlined above.  I explained to the patient the rationale of this strategy and reassured the patient it would not compromise the efficacy of treatment I would also recommend starting her on gabapentin. We discussed potential risk of nausea, constipation and sedation with gabapentin and she agreed to proceed

## 2016-07-13 NOTE — Assessment & Plan Note (Signed)
She has generalized deconditioning due to side effects from treatment. She had recent infection and now has side-effects from peripheral neuropathy. I spent some time filling up her disability application from her insurance.

## 2016-07-13 NOTE — Patient Instructions (Signed)
[facility] Discharge Instructions for Patients Receiving Chemotherapy  Today you received the following chemotherapy agents Velcade. To help prevent nausea and vomiting after your treatment, we encourage you to take your nausea medication as directed.  If you develop nausea and vomiting that is not controlled by your nausea medication, call the clinic.   BELOW ARE SYMPTOMS THAT SHOULD BE REPORTED IMMEDIATELY:  *FEVER GREATER THAN 100.5 F  *CHILLS WITH OR WITHOUT FEVER  NAUSEA AND VOMITING THAT IS NOT CONTROLLED WITH YOUR NAUSEA MEDICATION  *UNUSUAL SHORTNESS OF BREATH  *UNUSUAL BRUISING OR BLEEDING  TENDERNESS IN MOUTH AND THROAT WITH OR WITHOUT PRESENCE OF ULCERS  *URINARY PROBLEMS  *BOWEL PROBLEMS  UNUSUAL RASH Items with * indicate a potential emergency and should be followed up as soon as possible.  Feel free to call the clinic you have any questions or concerns. The clinic phone number is [phone].  Please show the CHEMO ALERT CARD at check-in to the Emergency Department and triage nurse.    

## 2016-07-20 ENCOUNTER — Other Ambulatory Visit (HOSPITAL_BASED_OUTPATIENT_CLINIC_OR_DEPARTMENT_OTHER): Payer: BC Managed Care – PPO

## 2016-07-20 ENCOUNTER — Ambulatory Visit (HOSPITAL_BASED_OUTPATIENT_CLINIC_OR_DEPARTMENT_OTHER): Payer: BC Managed Care – PPO

## 2016-07-20 ENCOUNTER — Ambulatory Visit: Payer: BC Managed Care – PPO

## 2016-07-20 VITALS — BP 124/69 | HR 84 | Temp 98.3°F | Resp 18

## 2016-07-20 DIAGNOSIS — C9001 Multiple myeloma in remission: Secondary | ICD-10-CM

## 2016-07-20 DIAGNOSIS — Z5112 Encounter for antineoplastic immunotherapy: Secondary | ICD-10-CM

## 2016-07-20 DIAGNOSIS — C9000 Multiple myeloma not having achieved remission: Secondary | ICD-10-CM

## 2016-07-20 LAB — COMPREHENSIVE METABOLIC PANEL
ALT: 14 U/L (ref 0–55)
AST: 19 U/L (ref 5–34)
Albumin: 3.6 g/dL (ref 3.5–5.0)
Alkaline Phosphatase: 70 U/L (ref 40–150)
Anion Gap: 10 mEq/L (ref 3–11)
BUN: 15 mg/dL (ref 7.0–26.0)
CO2: 27 mEq/L (ref 22–29)
Calcium: 10 mg/dL (ref 8.4–10.4)
Chloride: 102 mEq/L (ref 98–109)
Creatinine: 1.1 mg/dL (ref 0.6–1.1)
EGFR: 65 mL/min/{1.73_m2} — ABNORMAL LOW (ref >90)
Glucose: 136 mg/dl (ref 70–140)
Potassium: 4.1 mEq/L (ref 3.5–5.1)
Sodium: 139 mEq/L (ref 136–145)
Total Bilirubin: 0.82 mg/dL (ref 0.20–1.20)
Total Protein: 8.3 g/dL (ref 6.4–8.3)

## 2016-07-20 LAB — CBC WITH DIFFERENTIAL/PLATELET
BASO%: 0 % (ref 0.0–2.0)
Basophils Absolute: 0 10*3/uL (ref 0.0–0.1)
EOS%: 0 % (ref 0.0–7.0)
Eosinophils Absolute: 0 10*3/uL (ref 0.0–0.5)
HCT: 36 % (ref 34.8–46.6)
HGB: 11.6 g/dL (ref 11.6–15.9)
LYMPH%: 7.4 % — ABNORMAL LOW (ref 14.0–49.7)
MCH: 30.1 pg (ref 25.1–34.0)
MCHC: 32.2 g/dL (ref 31.5–36.0)
MCV: 93.3 fL (ref 79.5–101.0)
MONO#: 0.1 10*3/uL (ref 0.1–0.9)
MONO%: 0.7 % (ref 0.0–14.0)
NEUT#: 8.9 10*3/uL — ABNORMAL HIGH (ref 1.5–6.5)
NEUT%: 91.9 % — ABNORMAL HIGH (ref 38.4–76.8)
Platelets: 297 10*3/uL (ref 145–400)
RBC: 3.86 10*6/uL (ref 3.70–5.45)
RDW: 16.1 % — ABNORMAL HIGH (ref 11.2–14.5)
WBC: 9.7 10*3/uL (ref 3.9–10.3)
lymph#: 0.7 10*3/uL — ABNORMAL LOW (ref 0.9–3.3)

## 2016-07-20 MED ORDER — BORTEZOMIB CHEMO SQ INJECTION 3.5 MG (2.5MG/ML)
1.0000 mg/m2 | Freq: Once | INTRAMUSCULAR | Status: AC
  Administered 2016-07-20: 2.5 mg via SUBCUTANEOUS
  Filled 2016-07-20: qty 2.5

## 2016-07-20 MED ORDER — PROCHLORPERAZINE MALEATE 10 MG PO TABS
10.0000 mg | ORAL_TABLET | Freq: Once | ORAL | Status: AC
  Administered 2016-07-20: 10 mg via ORAL

## 2016-07-20 MED ORDER — PROCHLORPERAZINE MALEATE 10 MG PO TABS
ORAL_TABLET | ORAL | Status: AC
  Filled 2016-07-20: qty 1

## 2016-07-20 NOTE — Patient Instructions (Signed)

## 2016-07-20 NOTE — Progress Notes (Signed)
Nutrition Assessment   Reason for Assessment:   Patient identified on Malnutrition Screening Report for weight loss and poor appetite  ASSESSMENT:  66 year old female with multiple myeloma.  Past medical history of pancytopenia.    Patient reports taste alterations.  "Nothing taste right but I am still trying to eat."  Patient reports that she usually has cereal, yogurt, juice for breakfast or sometimes egg. Reports that for lunch will have soup.  Reports for dinner will have grilled chicken salad.  Reports that she has been eating fruit banana, peaches because they taste good to her.  Has not really had a big taste for meat lately but she knows protein is important.  Reports that she drinks a premier protein shake each day. Reports that she drinks lots of water   Reports that she has had some issues with constipation recently but this has resolved and has had a BM everyday this week.    Nutrition Focused Physical Exam: deferred  Medications: vit d3, vit b12, decadron, MVI, zofran, compazine  Labs: reviewed  Anthropometrics:   Height: 69 inches Weight: 283 pounds UBW: 270-275 pounds prior to 03/2016. Reports that she had gained weight during treatment until recently BMI: 41.9  Noted 13 pound weight loss in the last month (4% weight loss in 3 weeks)    NUTRITION DIAGNOSIS: Unintentional weight loss related to cancer related treatments as evidenced by taste changes, effecting appetite resulting in 4% weight loss in the last month   MALNUTRITION DIAGNOSIS: will continue to monitor    INTERVENTION:   Discussed strategies to prevent constipation. Fact sheet given.  Patient verbalized understanding. Discussed strategies to improve taste changes.  Fact sheet given Encouraged good sources of protein and examples discussed.       MONITORING, EVALUATION, GOAL: Patient will consume adequate calories and protein to maintain lean muscle mass   NEXT VISIT: as needed  Blanche Scovell B.  Zenia Resides, [place], [place] Registered Dietitian [phone] (pager)

## 2016-07-21 LAB — KAPPA/LAMBDA LIGHT CHAINS
Ig Kappa Free Light Chain: 28.9 mg/L — ABNORMAL HIGH (ref 3.3–19.4)
Ig Lambda Free Light Chain: 18.1 mg/L (ref 5.7–26.3)
Kappa/Lambda FluidC Ratio: 1.6 (ref 0.26–1.65)

## 2016-07-23 ENCOUNTER — Encounter: Payer: Self-pay | Admitting: Hematology and Oncology

## 2016-07-23 LAB — MULTIPLE MYELOMA PANEL, SERUM
Albumin SerPl Elph-Mcnc: 3.4 g/dL (ref 2.9–4.4)
Albumin/Glob SerPl: 0.9 (ref 0.7–1.7)
Alpha 1: 0.3 g/dL (ref 0.0–0.4)
Alpha2 Glob SerPl Elph-Mcnc: 1 g/dL (ref 0.4–1.0)
B-Globulin SerPl Elph-Mcnc: 1.1 g/dL (ref 0.7–1.3)
Gamma Glob SerPl Elph-Mcnc: 1.4 g/dL (ref 0.4–1.8)
Globulin, Total: 3.8 g/dL (ref 2.2–3.9)
IgA, Qn, Serum: 112 mg/dL (ref 87–352)
IgG, Qn, Serum: 1499 mg/dL (ref 700–1600)
IgM, Qn, Serum: 53 mg/dL (ref 26–217)
M Protein SerPl Elph-Mcnc: 0.6 g/dL — ABNORMAL HIGH
Total Protein: 7.2 g/dL (ref 6.0–8.5)

## 2016-07-27 ENCOUNTER — Ambulatory Visit (HOSPITAL_BASED_OUTPATIENT_CLINIC_OR_DEPARTMENT_OTHER): Payer: BC Managed Care – PPO

## 2016-07-27 ENCOUNTER — Ambulatory Visit: Payer: BC Managed Care – PPO

## 2016-07-27 ENCOUNTER — Other Ambulatory Visit (HOSPITAL_BASED_OUTPATIENT_CLINIC_OR_DEPARTMENT_OTHER): Payer: BC Managed Care – PPO

## 2016-07-27 ENCOUNTER — Ambulatory Visit (HOSPITAL_BASED_OUTPATIENT_CLINIC_OR_DEPARTMENT_OTHER): Payer: BC Managed Care – PPO | Admitting: Hematology and Oncology

## 2016-07-27 ENCOUNTER — Other Ambulatory Visit: Payer: Self-pay | Admitting: Hematology and Oncology

## 2016-07-27 DIAGNOSIS — G620 Drug-induced polyneuropathy: Secondary | ICD-10-CM

## 2016-07-27 DIAGNOSIS — Z5112 Encounter for antineoplastic immunotherapy: Secondary | ICD-10-CM

## 2016-07-27 DIAGNOSIS — C9000 Multiple myeloma not having achieved remission: Secondary | ICD-10-CM

## 2016-07-27 DIAGNOSIS — L270 Generalized skin eruption due to drugs and medicaments taken internally: Secondary | ICD-10-CM | POA: Diagnosis not present

## 2016-07-27 DIAGNOSIS — C9001 Multiple myeloma in remission: Secondary | ICD-10-CM

## 2016-07-27 DIAGNOSIS — T451X5A Adverse effect of antineoplastic and immunosuppressive drugs, initial encounter: Secondary | ICD-10-CM

## 2016-07-27 LAB — COMPREHENSIVE METABOLIC PANEL
ALT: 14 U/L (ref 0–55)
AST: 17 U/L (ref 5–34)
Albumin: 3.7 g/dL (ref 3.5–5.0)
Alkaline Phosphatase: 76 U/L (ref 40–150)
Anion Gap: 9 mEq/L (ref 3–11)
BUN: 23.1 mg/dL (ref 7.0–26.0)
CO2: 27 mEq/L (ref 22–29)
Calcium: 10.2 mg/dL (ref 8.4–10.4)
Chloride: 102 mEq/L (ref 98–109)
Creatinine: 1.1 mg/dL (ref 0.6–1.1)
EGFR: 59 mL/min/{1.73_m2} — ABNORMAL LOW (ref >90)
Glucose: 138 mg/dl (ref 70–140)
Potassium: 4.1 mEq/L (ref 3.5–5.1)
Sodium: 138 mEq/L (ref 136–145)
Total Bilirubin: 0.73 mg/dL (ref 0.20–1.20)
Total Protein: 8.2 g/dL (ref 6.4–8.3)

## 2016-07-27 LAB — CBC WITH DIFFERENTIAL/PLATELET
BASO%: 0.2 % (ref 0.0–2.0)
Basophils Absolute: 0 10*3/uL (ref 0.0–0.1)
EOS%: 0 % (ref 0.0–7.0)
Eosinophils Absolute: 0 10*3/uL (ref 0.0–0.5)
HCT: 37.3 % (ref 34.8–46.6)
HGB: 12.2 g/dL (ref 11.6–15.9)
LYMPH%: 7.3 % — ABNORMAL LOW (ref 14.0–49.7)
MCH: 30.5 pg (ref 25.1–34.0)
MCHC: 32.7 g/dL (ref 31.5–36.0)
MCV: 93.1 fL (ref 79.5–101.0)
MONO#: 0 10*3/uL — ABNORMAL LOW (ref 0.1–0.9)
MONO%: 0.6 % (ref 0.0–14.0)
NEUT#: 6.5 10*3/uL (ref 1.5–6.5)
NEUT%: 91.9 % — ABNORMAL HIGH (ref 38.4–76.8)
Platelets: 227 10*3/uL (ref 145–400)
RBC: 4 10*6/uL (ref 3.70–5.45)
RDW: 16.4 % — ABNORMAL HIGH (ref 11.2–14.5)
WBC: 7.1 10*3/uL (ref 3.9–10.3)
lymph#: 0.5 10*3/uL — ABNORMAL LOW (ref 0.9–3.3)

## 2016-07-27 MED ORDER — PROCHLORPERAZINE MALEATE 10 MG PO TABS
ORAL_TABLET | ORAL | Status: AC
  Filled 2016-07-27: qty 1

## 2016-07-27 MED ORDER — SODIUM CHLORIDE 0.9 % IV SOLN
Freq: Once | INTRAVENOUS | Status: AC
  Administered 2016-07-27: 15:00:00 via INTRAVENOUS

## 2016-07-27 MED ORDER — BORTEZOMIB CHEMO SQ INJECTION 3.5 MG (2.5MG/ML)
0.7800 mg/m2 | Freq: Once | INTRAMUSCULAR | Status: AC
  Administered 2016-07-27: 2 mg via SUBCUTANEOUS
  Filled 2016-07-27: qty 2

## 2016-07-27 MED ORDER — ZOLEDRONIC ACID 4 MG/100ML IV SOLN
4.0000 mg | Freq: Once | INTRAVENOUS | Status: AC
  Administered 2016-07-27: 4 mg via INTRAVENOUS
  Filled 2016-07-27: qty 100

## 2016-07-27 MED ORDER — PROCHLORPERAZINE MALEATE 10 MG PO TABS
10.0000 mg | ORAL_TABLET | Freq: Once | ORAL | Status: AC
  Administered 2016-07-27: 10 mg via ORAL

## 2016-07-27 NOTE — Patient Instructions (Signed)
[facility] Discharge Instructions for Patients Receiving Chemotherapy  Today you received the following chemotherapy agents Velcade. To help prevent nausea and vomiting after your treatment, we encourage you to take your nausea medication as directed.  If you develop nausea and vomiting that is not controlled by your nausea medication, call the clinic.   BELOW ARE SYMPTOMS THAT SHOULD BE REPORTED IMMEDIATELY:  *FEVER GREATER THAN 100.5 F  *CHILLS WITH OR WITHOUT FEVER  NAUSEA AND VOMITING THAT IS NOT CONTROLLED WITH YOUR NAUSEA MEDICATION  *UNUSUAL SHORTNESS OF BREATH  *UNUSUAL BRUISING OR BLEEDING  TENDERNESS IN MOUTH AND THROAT WITH OR WITHOUT PRESENCE OF ULCERS  *URINARY PROBLEMS  *BOWEL PROBLEMS  UNUSUAL RASH Items with * indicate a potential emergency and should be followed up as soon as possible.  Feel free to call the clinic you have any questions or concerns. The clinic phone number is [phone].  Please show the CHEMO ALERT CARD at check-in to the Emergency Department and triage nurse.    

## 2016-07-28 ENCOUNTER — Other Ambulatory Visit: Payer: Self-pay | Admitting: Hematology and Oncology

## 2016-07-28 ENCOUNTER — Encounter: Payer: Self-pay | Admitting: Hematology and Oncology

## 2016-07-28 NOTE — Assessment & Plan Note (Addendum)
she has mild peripheral neuropathy, likely related to side effects of treatment. I plan to reduce the dose of treatment as outlined above.  I also recommend she increase the dose of gabapentin

## 2016-07-28 NOTE — Assessment & Plan Note (Signed)
I reviewed recent treatment recommendations from her oncologist at [facility]. The patient desired to delay transplant until around May 2018. I plan to order several more cycles of treatment with Revlimid, dexamethasone and Velcade. Unfortunately, she had significant signs of allergies recently along with significant rash, which could be attributed to Revlimid. Her Revlimid is placed on hold.  The rash is improved We will resume treatment now. She has signs of peripheral neuropathy, worse despite recent changes in treatment plan.  I plan to reduce Velcade further and to be given on days 1, 8, 15 and rest day 22 She will continue calcium with vitamin D supplement and Zometa every 3 months, next due around March 2018. She will take dexamethasone 8 mg on Tuesdays only Her recent myeloma panel show continued improvement of disease control with VGPR

## 2016-07-28 NOTE — Progress Notes (Signed)
[facility] OFFICE PROGRESS NOTE  Patient Care Team: Lucianne Lei, MD as PCP - General (Family Medicine)  SUMMARY OF ONCOLOGIC HISTORY:   Multiple myeloma ([place])   03/22/2016 Bone Marrow Biopsy    Bone Marrow Biopsy: Plasma cells 17% by Aspirate and 30% by CD 138 stain. Plasma cell neoplasm, Kappa Restricted Normal cytogenetics, FISH positive for +11 and +14 and +7      04/13/2016 Imaging    Skeletal survey showed lucencies within the calvarium worrisome for myeloma. No definite abnormal lytic or blastic lesions are observed elsewhere.      04/19/2016 -  Chemotherapy    The patient consented to treatment with Revlimid, dexamethasone and Velcade       INTERVAL HISTORY: Please see below for problem oriented charting. She returns for further follow-up. Her skin rash has resolved. Her peripheral neuropathy is worse especially in her feet at nighttime. She tolerated gabapentin well. Denies recent bone pain or infection. She has mild skin peeling in her hands.  No signs of active bleeding  REVIEW OF SYSTEMS:   Constitutional: Denies fevers, chills or abnormal weight loss Eyes: Denies blurriness of vision Ears, nose, mouth, throat, and face: Denies mucositis or sore throat Respiratory: Denies cough, dyspnea or wheezes Cardiovascular: Denies palpitation, chest discomfort or lower extremity swelling Gastrointestinal:  Denies nausea, heartburn or change in bowel habits Lymphatics: Denies new lymphadenopathy or easy bruising Neurological:Denies numbness, tingling Behavioral/Psych: Mood is stable, no new changes  All other systems were reviewed with the patient and are negative.  I have reviewed the past medical history, past surgical history, social history and family history with the patient and they are unchanged from previous note.  ALLERGIES:  has No Known Allergies.  MEDICATIONS:  Current Outpatient Prescriptions  Medication Sig Dispense Refill  . acyclovir  (ZOVIRAX) 400 MG tablet Take 1 tablet (400 mg total) by mouth 2 (two) times daily. 60 tablet 3  . ASPIRIN 81 PO Take 81 mg by mouth daily.    . bisacodyl (BISACODYL) 5 MG EC tablet Take 5 mg by mouth daily as needed for moderate constipation.    . Cholecalciferol (VITAMIN D3 PO) Take 5,000 Int'l Units by mouth daily.    . Cyanocobalamin (VITAMIN B-12 PO) Take 5,000 mcg by mouth 2 (two) times daily.    Marland Kitchen dexamethasone (DECADRON) 4 MG tablet Take 10 tablets (40 mg) weekly 30 tablet 3  . FIBER PO Take by mouth daily.    Marland Kitchen gabapentin (NEURONTIN) 300 MG capsule Take 1 capsule (300 mg total) by mouth 3 (three) times daily. 90 capsule 9  . losartan-hydrochlorothiazide (HYZAAR) 100-25 MG tablet Take 1 tablet by mouth daily. 90 tablet 3  . Multiple Vitamin (MULTIVITAMIN) tablet Take 1 tablet by mouth daily.    . Omega-3 Fatty Acids (OMEGA-3 FISH OIL PO) Take by mouth daily.    . ondansetron (ZOFRAN) 8 MG tablet Take 1 tablet (8 mg total) by mouth 2 (two) times daily as needed (Nausea or vomiting). 30 tablet 1  . Sodium Fluoride 0.15 % PSTE Use as directed in the mouth or throat.    . triamcinolone (NASACORT) 55 MCG/ACT AERO nasal inhaler Place 2 sprays into the nose daily. 1 Inhaler 12  . doxycycline (MONODOX) 50 MG capsule Take 50 mg by mouth 2 (two) times daily.    Marland Kitchen lenalidomide (REVLIMID) 10 MG capsule TAKE 1 CAPSULE (10MG) BY MOUTH ONCE DAILY FOR 14 DAYS ON THEN 7 DAYS OFF. (Patient not taking: Reported on 07/27/2016)  14 capsule 0  . prochlorperazine (COMPAZINE) 10 MG tablet Take 1 tablet (10 mg total) by mouth every 6 (six) hours as needed (Nausea or vomiting). (Patient not taking: Reported on 06/22/2016) 30 tablet 1   No current facility-administered medications for this visit.     PHYSICAL EXAMINATION: ECOG PERFORMANCE STATUS: 1 - Symptomatic but completely ambulatory  Vitals:   07/27/16 1440  BP: 123/81  Pulse: 96  Resp: 18  Temp: 97.8 F (36.6 C)   Filed Weights   07/27/16 1440   Weight: 277 lb 4.8 oz (125.8 kg)    GENERAL:alert, no distress and comfortable SKIN: No skin rash.  Noted skin peeling EYES: normal, Conjunctiva are pink and non-injected, sclera clear OROPHARYNX:no exudate, no erythema and lips, buccal mucosa, and tongue normal  NECK: supple, thyroid normal size, non-tender, without nodularity LYMPH:  no palpable lymphadenopathy in the cervical, axillary or inguinal LUNGS: clear to auscultation and percussion with normal breathing effort HEART: regular rate & rhythm and no murmurs and no lower extremity edema ABDOMEN:abdomen soft, non-tender and normal bowel sounds Musculoskeletal:no cyanosis of digits and no clubbing  NEURO: alert & oriented x 3 with fluent speech, no focal motor/sensory deficits  LABORATORY DATA:  I have reviewed the data as listed    Component Value Date/Time   NA 138 07/27/2016 1426   K 4.1 07/27/2016 1426   CO2 27 07/27/2016 1426   GLUCOSE 138 07/27/2016 1426   BUN 23.1 07/27/2016 1426   CREATININE 1.1 07/27/2016 1426   CALCIUM 10.2 07/27/2016 1426   PROT 8.2 07/27/2016 1426   ALBUMIN 3.7 07/27/2016 1426   AST 17 07/27/2016 1426   ALT 14 07/27/2016 1426   ALKPHOS 76 07/27/2016 1426   BILITOT 0.73 07/27/2016 1426    No results found for: SPEP, UPEP  Lab Results  Component Value Date   WBC 7.1 07/27/2016   NEUTROABS 6.5 07/27/2016   HGB 12.2 07/27/2016   HCT 37.3 07/27/2016   MCV 93.1 07/27/2016   PLT 227 07/27/2016      Chemistry      Component Value Date/Time   NA 138 07/27/2016 1426   K 4.1 07/27/2016 1426   CO2 27 07/27/2016 1426   BUN 23.1 07/27/2016 1426   CREATININE 1.1 07/27/2016 1426      Component Value Date/Time   CALCIUM 10.2 07/27/2016 1426   ALKPHOS 76 07/27/2016 1426   AST 17 07/27/2016 1426   ALT 14 07/27/2016 1426   BILITOT 0.73 07/27/2016 1426      ASSESSMENT & PLAN:  Multiple myeloma ([place]) I reviewed recent treatment recommendations from her oncologist at [facility]. The patient desired to delay transplant until around May 2018. I plan to order several more cycles of treatment with Revlimid, dexamethasone and Velcade. Unfortunately, she had significant signs of allergies recently along with significant rash, which could be attributed to Revlimid. Her Revlimid is placed on hold.  The rash is improved We will resume treatment now. She has signs of peripheral neuropathy, worse despite recent changes in treatment plan.  I plan to reduce Velcade further and to be given on days 1, 8, 15 and rest day 22 She will continue calcium with vitamin D supplement and Zometa every 3 months, next due around March 2018. She will take dexamethasone 8 mg on Tuesdays only Her recent myeloma panel show continued improvement of disease control with VGPR  Peripheral neuropathy due to chemotherapy [facility]) she has mild peripheral neuropathy, likely related to  side effects of treatment. I plan to reduce the dose of treatment as outlined above.  I also recommend she increase the dose of gabapentin  Drug-induced skin rash The skin rash had resolved.  She had mild skin peeling in her hands without ulceration or bleeding. Recommend topical emollient cream   No orders of the defined types were placed in this encounter.  All questions were answered. The patient knows to call the clinic with any problems, questions or concerns. No barriers to learning was detected. I spent 25 minutes counseling the patient face to face. The total time spent in the appointment was 30 minutes and more than 50% was on counseling and review of test results     Heath Lark, MD 07/28/2016 7:01 AM

## 2016-07-28 NOTE — Assessment & Plan Note (Signed)
The skin rash had resolved.  She had mild skin peeling in her hands without ulceration or bleeding. Recommend topical emollient cream

## 2016-08-03 MED ORDER — LENALIDOMIDE 10 MG PO CAPS: ORAL_CAPSULE | ORAL | 0 refills | Status: DC

## 2016-08-06 ENCOUNTER — Other Ambulatory Visit: Payer: Self-pay | Admitting: Hematology and Oncology

## 2016-08-06 DIAGNOSIS — C9000 Multiple myeloma not having achieved remission: Secondary | ICD-10-CM

## 2016-08-09 ENCOUNTER — Other Ambulatory Visit: Payer: Self-pay | Admitting: Hematology and Oncology

## 2016-08-09 ENCOUNTER — Telehealth: Payer: Self-pay | Admitting: *Deleted

## 2016-08-09 MED ORDER — TRAMADOL HCL 50 MG PO TABS: 50.0000 mg | ORAL_TABLET | Freq: Four times a day (QID) | ORAL | 0 refills | Status: DC | PRN

## 2016-08-09 NOTE — Telephone Encounter (Signed)
Her last set of labs showed she has achieved near remission with myeloma control I recommend she continues gabapentin, take next 2 weeks off and keep her appt to see me as scheduled on 4/10 (if she agrees, proceed to cancel her labs and chemo appt for tomorrow and next week) Continue Revlimid for now Ask her is she wants to try some pain medications to take at night.

## 2016-08-09 NOTE — Telephone Encounter (Signed)
Notified of message below. Verbalized understanding.  Dr Alvy Bimler recommends tramadol for pain

## 2016-08-09 NOTE — Telephone Encounter (Signed)
Patient called stating that her neuropathy in her feet is getting worse (increased numbness/pain) mostly at night. Patient is currently taking Neurontin in which change of dose only helped for 2 nights.

## 2016-08-10 ENCOUNTER — Ambulatory Visit: Payer: BC Managed Care – PPO

## 2016-08-10 ENCOUNTER — Other Ambulatory Visit: Payer: BC Managed Care – PPO

## 2016-08-13 ENCOUNTER — Other Ambulatory Visit: Payer: Self-pay | Admitting: Hematology and Oncology

## 2016-08-13 NOTE — Telephone Encounter (Signed)
PLs refill electronically

## 2016-08-17 ENCOUNTER — Other Ambulatory Visit: Payer: BC Managed Care – PPO

## 2016-08-17 ENCOUNTER — Ambulatory Visit: Payer: BC Managed Care – PPO

## 2016-08-19 ENCOUNTER — Encounter: Payer: Self-pay | Admitting: Hematology and Oncology

## 2016-08-20 ENCOUNTER — Telehealth: Payer: Self-pay | Admitting: *Deleted

## 2016-08-20 NOTE — Telephone Encounter (Signed)
Faxed [facility] report to [facility] per patient/Dr Alvy Bimler request

## 2016-08-24 ENCOUNTER — Other Ambulatory Visit: Payer: Self-pay | Admitting: Hematology and Oncology

## 2016-08-24 ENCOUNTER — Encounter: Payer: Self-pay | Admitting: Hematology and Oncology

## 2016-08-24 ENCOUNTER — Telehealth: Payer: Self-pay | Admitting: Hematology and Oncology

## 2016-08-24 ENCOUNTER — Ambulatory Visit (HOSPITAL_BASED_OUTPATIENT_CLINIC_OR_DEPARTMENT_OTHER): Payer: BC Managed Care – PPO

## 2016-08-24 ENCOUNTER — Ambulatory Visit (HOSPITAL_BASED_OUTPATIENT_CLINIC_OR_DEPARTMENT_OTHER): Payer: BC Managed Care – PPO | Admitting: Hematology and Oncology

## 2016-08-24 ENCOUNTER — Other Ambulatory Visit (HOSPITAL_BASED_OUTPATIENT_CLINIC_OR_DEPARTMENT_OTHER): Payer: BC Managed Care – PPO

## 2016-08-24 DIAGNOSIS — Z5112 Encounter for antineoplastic immunotherapy: Secondary | ICD-10-CM | POA: Diagnosis not present

## 2016-08-24 DIAGNOSIS — C9000 Multiple myeloma not having achieved remission: Secondary | ICD-10-CM

## 2016-08-24 DIAGNOSIS — E876 Hypokalemia: Secondary | ICD-10-CM | POA: Insufficient documentation

## 2016-08-24 DIAGNOSIS — G629 Polyneuropathy, unspecified: Secondary | ICD-10-CM | POA: Diagnosis not present

## 2016-08-24 DIAGNOSIS — C9001 Multiple myeloma in remission: Secondary | ICD-10-CM

## 2016-08-24 DIAGNOSIS — G620 Drug-induced polyneuropathy: Secondary | ICD-10-CM

## 2016-08-24 DIAGNOSIS — T451X5A Adverse effect of antineoplastic and immunosuppressive drugs, initial encounter: Secondary | ICD-10-CM

## 2016-08-24 LAB — CBC WITH DIFFERENTIAL/PLATELET
BASO%: 0.1 % (ref 0.0–2.0)
Basophils Absolute: 0 10*3/uL (ref 0.0–0.1)
EOS%: 0 % (ref 0.0–7.0)
Eosinophils Absolute: 0 10*3/uL (ref 0.0–0.5)
HCT: 38.5 % (ref 34.8–46.6)
HGB: 12.4 g/dL (ref 11.6–15.9)
LYMPH%: 6.6 % — ABNORMAL LOW (ref 14.0–49.7)
MCH: 29.6 pg (ref 25.1–34.0)
MCHC: 32.3 g/dL (ref 31.5–36.0)
MCV: 91.6 fL (ref 79.5–101.0)
MONO#: 0.1 10*3/uL (ref 0.1–0.9)
MONO%: 1.1 % (ref 0.0–14.0)
NEUT#: 4.5 10*3/uL (ref 1.5–6.5)
NEUT%: 92.2 % — ABNORMAL HIGH (ref 38.4–76.8)
Platelets: 240 10*3/uL (ref 145–400)
RBC: 4.2 10*6/uL (ref 3.70–5.45)
RDW: 15.1 % — ABNORMAL HIGH (ref 11.2–14.5)
WBC: 4.9 10*3/uL (ref 3.9–10.3)
lymph#: 0.3 10*3/uL — ABNORMAL LOW (ref 0.9–3.3)

## 2016-08-24 LAB — COMPREHENSIVE METABOLIC PANEL
ALT: 21 U/L (ref 0–55)
AST: 23 U/L (ref 5–34)
Albumin: 3.5 g/dL (ref 3.5–5.0)
Alkaline Phosphatase: 68 U/L (ref 40–150)
Anion Gap: 11 mEq/L (ref 3–11)
BUN: 20.5 mg/dL (ref 7.0–26.0)
CO2: 27 mEq/L (ref 22–29)
Calcium: 9.7 mg/dL (ref 8.4–10.4)
Chloride: 100 mEq/L (ref 98–109)
Creatinine: 1.1 mg/dL (ref 0.6–1.1)
EGFR: 63 mL/min/{1.73_m2} — ABNORMAL LOW (ref >90)
Glucose: 162 mg/dl — ABNORMAL HIGH (ref 70–140)
Potassium: 3.4 mEq/L — ABNORMAL LOW (ref 3.5–5.1)
Sodium: 137 mEq/L (ref 136–145)
Total Bilirubin: 0.66 mg/dL (ref 0.20–1.20)
Total Protein: 7.8 g/dL (ref 6.4–8.3)

## 2016-08-24 MED ORDER — PROCHLORPERAZINE MALEATE 10 MG PO TABS
10.0000 mg | ORAL_TABLET | Freq: Once | ORAL | Status: AC
  Administered 2016-08-24: 10 mg via ORAL

## 2016-08-24 MED ORDER — PROCHLORPERAZINE MALEATE 10 MG PO TABS
ORAL_TABLET | ORAL | Status: AC
  Filled 2016-08-24: qty 1

## 2016-08-24 MED ORDER — GABAPENTIN 300 MG PO CAPS: ORAL_CAPSULE | ORAL | 9 refills | Status: DC

## 2016-08-24 MED ORDER — BORTEZOMIB CHEMO SQ INJECTION 3.5 MG (2.5MG/ML)
0.7800 mg/m2 | Freq: Once | INTRAMUSCULAR | Status: AC
  Administered 2016-08-24: 2 mg via SUBCUTANEOUS
  Filled 2016-08-24: qty 2

## 2016-08-24 NOTE — Assessment & Plan Note (Signed)
Cause unknown, could be related to medication side effects I recommend potassium rich diet only The patient is given patient education handout related to potassium rich diet

## 2016-08-24 NOTE — Assessment & Plan Note (Addendum)
The patient desired to delay transplant until around May 2018. She has recent allergies recently along with significant rash, which could be attributed to Revlimid. Her Revlimid is placed on hold.  The rash is improved and was resumed She has signs of peripheral neuropathy, worse despite recent changes in treatment plan.  I plan to reduce Velcade further and to be given on days 1, 8, 15 and rest day 22 She will complete the cycles of Velcade in 2 weeks She will continue calcium with vitamin D supplement and Zometa every 3 months, next due around June 2018. I recommend dexamethasone taper.  Next week, she will take 4 mg before treatment and then stop Her recent myeloma panel show continued improvement of disease control with VGPR She has transplant appointment next week.  Patient will call me if transplant is delayed. Otherwise, I will not see her back until transplant is completed

## 2016-08-24 NOTE — Telephone Encounter (Signed)
Labs and Chemo scheduled for 04/20 & 04/27. Date ok per Dr Alvy Bimler. Patient was given a copy of the AVS report and appointment schedule per 08/24/16 los.

## 2016-08-24 NOTE — Progress Notes (Signed)
[facility] OFFICE PROGRESS NOTE  Patient Care Team: Lucianne Lei, MD as PCP - General (Family Medicine)  SUMMARY OF ONCOLOGIC HISTORY:   Multiple myeloma ([place])   03/22/2016 Bone Marrow Biopsy    Bone Marrow Biopsy: Plasma cells 17% by Aspirate and 30% by CD 138 stain. Plasma cell neoplasm, Kappa Restricted Normal cytogenetics, FISH positive for +11 and +14 and +7      04/13/2016 Imaging    Skeletal survey showed lucencies within the calvarium worrisome for myeloma. No definite abnormal lytic or blastic lesions are observed elsewhere.      04/19/2016 -  Chemotherapy    The patient consented to treatment with Revlimid, dexamethasone and Velcade       INTERVAL HISTORY: Please see below for problem oriented charting. She returns for further follow-up Skin rash has resolved She denies side effects of treatment recently Denies bone pain or infection Peripheral neuropathy is improved with gabapentin and tramadol as needed  REVIEW OF SYSTEMS:   Constitutional: Denies fevers, chills or abnormal weight loss Eyes: Denies blurriness of vision Ears, nose, mouth, throat, and face: Denies mucositis or sore throat Respiratory: Denies cough, dyspnea or wheezes Cardiovascular: Denies palpitation, chest discomfort or lower extremity swelling Gastrointestinal:  Denies nausea, heartburn or change in bowel habits Skin: Denies abnormal skin rashes Lymphatics: Denies new lymphadenopathy or easy bruising Neurological:Denies numbness, tingling or new weaknesses Behavioral/Psych: Mood is stable, no new changes  All other systems were reviewed with the patient and are negative.  I have reviewed the past medical history, past surgical history, social history and family history with the patient and they are unchanged from previous note.  ALLERGIES:  has No Known Allergies.  MEDICATIONS:  Current Outpatient Prescriptions  Medication Sig Dispense Refill  . acyclovir (ZOVIRAX) 400 MG  tablet Take 1 tablet (400 mg total) by mouth 2 (two) times daily. 60 tablet 3  . ASPIRIN 81 PO Take 81 mg by mouth daily.    . bisacodyl (BISACODYL) 5 MG EC tablet Take 5 mg by mouth daily as needed for moderate constipation.    . Cholecalciferol (VITAMIN D3 PO) Take 5,000 Int'l Units by mouth daily.    . Cyanocobalamin (VITAMIN B-12 PO) Take 5,000 mcg by mouth 2 (two) times daily.    Marland Kitchen dexamethasone (DECADRON) 4 MG tablet Take 8 mg once a week with food 30 tablet 3  . FIBER PO Take by mouth daily.    Marland Kitchen gabapentin (NEURONTIN) 300 MG capsule Take 1 in the morning, 1 in the afternoon and 2 at night 90 capsule 9  . losartan-hydrochlorothiazide (HYZAAR) 100-25 MG tablet Take 1 tablet by mouth daily. 90 tablet 3  . Multiple Vitamin (MULTIVITAMIN) tablet Take 1 tablet by mouth daily.    . Omega-3 Fatty Acids (OMEGA-3 FISH OIL PO) Take by mouth daily.    Marland Kitchen REVLIMID 10 MG capsule TAKE 1 CAPSULE (10MG) BY MOUTH ONCE DAILY FOR 14 DAYS ON THEN 7 DAYS OFF. 14 capsule 0  . Sodium Fluoride 0.15 % PSTE Use as directed in the mouth or throat.    . traMADol (ULTRAM) 50 MG tablet Take 1 tablet (50 mg total) by mouth every 6 (six) hours as needed. 90 tablet 0  . triamcinolone (NASACORT) 55 MCG/ACT AERO nasal inhaler Place 2 sprays into the nose daily. 1 Inhaler 12  . ondansetron (ZOFRAN) 8 MG tablet Take 1 tablet (8 mg total) by mouth 2 (two) times daily as needed (Nausea or vomiting). (Patient not taking: Reported  on 08/24/2016) 30 tablet 1  . prochlorperazine (COMPAZINE) 10 MG tablet Take 1 tablet (10 mg total) by mouth every 6 (six) hours as needed (Nausea or vomiting). (Patient not taking: Reported on 06/22/2016) 30 tablet 1   No current facility-administered medications for this visit.     PHYSICAL EXAMINATION: ECOG PERFORMANCE STATUS: 1 - Symptomatic but completely ambulatory  Vitals:   08/24/16 1355  BP: (!) 141/69  Pulse: 90  Resp: 19  Temp: 98.1 F (36.7 C)   Filed Weights   08/24/16 1355   Weight: 273 lb 3.2 oz (123.9 kg)    GENERAL:alert, no distress and comfortable SKIN: skin color, texture, turgor are normal, no rashes or significant lesions EYES: normal, Conjunctiva are pink and non-injected, sclera clear Musculoskeletal:no cyanosis of digits and no clubbing  NEURO: alert & oriented x 3 with fluent speech, no focal motor/sensory deficits  LABORATORY DATA:  I have reviewed the data as listed    Component Value Date/Time   NA 137 08/24/2016 1343   K 3.4 (L) 08/24/2016 1343   CO2 27 08/24/2016 1343   GLUCOSE 162 (H) 08/24/2016 1343   BUN 20.5 08/24/2016 1343   CREATININE 1.1 08/24/2016 1343   CALCIUM 9.7 08/24/2016 1343   PROT 7.8 08/24/2016 1343   ALBUMIN 3.5 08/24/2016 1343   AST 23 08/24/2016 1343   ALT 21 08/24/2016 1343   ALKPHOS 68 08/24/2016 1343   BILITOT 0.66 08/24/2016 1343    No results found for: SPEP, UPEP  Lab Results  Component Value Date   WBC 4.9 08/24/2016   NEUTROABS 4.5 08/24/2016   HGB 12.4 08/24/2016   HCT 38.5 08/24/2016   MCV 91.6 08/24/2016   PLT 240 08/24/2016      Chemistry      Component Value Date/Time   NA 137 08/24/2016 1343   K 3.4 (L) 08/24/2016 1343   CO2 27 08/24/2016 1343   BUN 20.5 08/24/2016 1343   CREATININE 1.1 08/24/2016 1343      Component Value Date/Time   CALCIUM 9.7 08/24/2016 1343   ALKPHOS 68 08/24/2016 1343   AST 23 08/24/2016 1343   ALT 21 08/24/2016 1343   BILITOT 0.66 08/24/2016 1343       ASSESSMENT & PLAN:  Multiple myeloma ([place]) The patient desired to delay transplant until around May 2018. She has recent allergies recently along with significant rash, which could be attributed to Revlimid. Her Revlimid is placed on hold.  The rash is improved and was resumed She has signs of peripheral neuropathy, worse despite recent changes in treatment plan.  I plan to reduce Velcade further and to be given on days 1, 8, 15 and rest day 22 She will complete the cycles of Velcade in 2 weeks She  will continue calcium with vitamin D supplement and Zometa every 3 months, next due around June 2018. I recommend dexamethasone taper.  Next week, she will take 4 mg before treatment and then stop Her recent myeloma panel show continued improvement of disease control with VGPR She has transplant appointment next week.  Patient will call me if transplant is delayed. Otherwise, I will not see her back until transplant is completed  Peripheral neuropathy due to chemotherapy [facility]) she has mild peripheral neuropathy, likely related to side effects of treatment. I plan to reduce the dose of treatment as outlined above.  I also recommend she increase the dose of gabapentin She will also take tramadol as needed  Hypokalemia Cause unknown, could be related  to medication side effects I recommend potassium rich diet only The patient is given patient education handout related to potassium rich diet   No orders of the defined types were placed in this encounter.  All questions were answered. The patient knows to call the clinic with any problems, questions or concerns. No barriers to learning was detected. I spent 15 minutes counseling the patient face to face. The total time spent in the appointment was 20 minutes and more than 50% was on counseling and review of test results     Heath Lark, MD 08/24/2016 2:30 PM

## 2016-08-24 NOTE — Patient Instructions (Signed)
[facility] Discharge Instructions for Patients Receiving Chemotherapy  Today you received the following chemotherapy agents Velcade. To help prevent nausea and vomiting after your treatment, we encourage you to take your nausea medication as directed.  If you develop nausea and vomiting that is not controlled by your nausea medication, call the clinic.   BELOW ARE SYMPTOMS THAT SHOULD BE REPORTED IMMEDIATELY:  *FEVER GREATER THAN 100.5 F  *CHILLS WITH OR WITHOUT FEVER  NAUSEA AND VOMITING THAT IS NOT CONTROLLED WITH YOUR NAUSEA MEDICATION  *UNUSUAL SHORTNESS OF BREATH  *UNUSUAL BRUISING OR BLEEDING  TENDERNESS IN MOUTH AND THROAT WITH OR WITHOUT PRESENCE OF ULCERS  *URINARY PROBLEMS  *BOWEL PROBLEMS  UNUSUAL RASH Items with * indicate a potential emergency and should be followed up as soon as possible.  Feel free to call the clinic you have any questions or concerns. The clinic phone number is [phone].  Please show the CHEMO ALERT CARD at check-in to the Emergency Department and triage nurse.    

## 2016-08-24 NOTE — Assessment & Plan Note (Signed)
she has mild peripheral neuropathy, likely related to side effects of treatment. I plan to reduce the dose of treatment as outlined above.  I also recommend she increase the dose of gabapentin She will also take tramadol as needed

## 2016-08-25 LAB — KAPPA/LAMBDA LIGHT CHAINS
Ig Kappa Free Light Chain: 29.5 mg/L — ABNORMAL HIGH (ref 3.3–19.4)
Ig Lambda Free Light Chain: 16.1 mg/L (ref 5.7–26.3)
Kappa/Lambda FluidC Ratio: 1.83 — ABNORMAL HIGH (ref 0.26–1.65)

## 2016-08-26 LAB — MULTIPLE MYELOMA PANEL, SERUM
Albumin SerPl Elph-Mcnc: 3.3 g/dL (ref 2.9–4.4)
Albumin/Glob SerPl: 1 (ref 0.7–1.7)
Alpha 1: 0.2 g/dL (ref 0.0–0.4)
Alpha2 Glob SerPl Elph-Mcnc: 0.9 g/dL (ref 0.4–1.0)
B-Globulin SerPl Elph-Mcnc: 1 g/dL (ref 0.7–1.3)
Gamma Glob SerPl Elph-Mcnc: 1.3 g/dL (ref 0.4–1.8)
Globulin, Total: 3.5 g/dL (ref 2.2–3.9)
IgA, Qn, Serum: 137 mg/dL (ref 87–352)
IgG, Qn, Serum: 1386 mg/dL (ref 700–1600)
IgM, Qn, Serum: 56 mg/dL (ref 26–217)
M Protein SerPl Elph-Mcnc: 0.5 g/dL — ABNORMAL HIGH
Total Protein: 6.8 g/dL (ref 6.0–8.5)

## 2016-08-30 ENCOUNTER — Telehealth: Payer: Self-pay | Admitting: Hematology and Oncology

## 2016-08-30 NOTE — Telephone Encounter (Signed)
Faxed records to [facility] [phone]

## 2016-09-02 ENCOUNTER — Encounter: Payer: Self-pay | Admitting: Hematology and Oncology

## 2016-09-03 ENCOUNTER — Other Ambulatory Visit: Payer: Self-pay | Admitting: Hematology and Oncology

## 2016-09-03 ENCOUNTER — Other Ambulatory Visit (HOSPITAL_BASED_OUTPATIENT_CLINIC_OR_DEPARTMENT_OTHER): Payer: BC Managed Care – PPO

## 2016-09-03 ENCOUNTER — Ambulatory Visit (HOSPITAL_BASED_OUTPATIENT_CLINIC_OR_DEPARTMENT_OTHER): Payer: BC Managed Care – PPO

## 2016-09-03 ENCOUNTER — Encounter: Payer: Self-pay | Admitting: Hematology and Oncology

## 2016-09-03 DIAGNOSIS — C9000 Multiple myeloma not having achieved remission: Secondary | ICD-10-CM | POA: Diagnosis not present

## 2016-09-03 DIAGNOSIS — Z5112 Encounter for antineoplastic immunotherapy: Secondary | ICD-10-CM

## 2016-09-03 LAB — CBC WITH DIFFERENTIAL/PLATELET
BASO%: 1.2 % (ref 0.0–2.0)
Basophils Absolute: 0.1 10*3/uL (ref 0.0–0.1)
EOS%: 5.1 % (ref 0.0–7.0)
Eosinophils Absolute: 0.2 10*3/uL (ref 0.0–0.5)
HCT: 35.7 % (ref 34.8–46.6)
HGB: 11.6 g/dL (ref 11.6–15.9)
LYMPH%: 31.9 % (ref 14.0–49.7)
MCH: 29.4 pg (ref 25.1–34.0)
MCHC: 32.5 g/dL (ref 31.5–36.0)
MCV: 90.6 fL (ref 79.5–101.0)
MONO#: 0.4 10*3/uL (ref 0.1–0.9)
MONO%: 10.8 % (ref 0.0–14.0)
NEUT#: 2.1 10*3/uL (ref 1.5–6.5)
NEUT%: 51 % (ref 38.4–76.8)
Platelets: 258 10*3/uL (ref 145–400)
RBC: 3.94 10*6/uL (ref 3.70–5.45)
RDW: 14.8 % — ABNORMAL HIGH (ref 11.2–14.5)
WBC: 4.1 10*3/uL (ref 3.9–10.3)
lymph#: 1.3 10*3/uL (ref 0.9–3.3)

## 2016-09-03 LAB — COMPREHENSIVE METABOLIC PANEL
ALT: 16 U/L (ref 0–55)
AST: 20 U/L (ref 5–34)
Albumin: 3.4 g/dL — ABNORMAL LOW (ref 3.5–5.0)
Alkaline Phosphatase: 56 U/L (ref 40–150)
Anion Gap: 10 mEq/L (ref 3–11)
BUN: 16 mg/dL (ref 7.0–26.0)
CO2: 28 mEq/L (ref 22–29)
Calcium: 9.6 mg/dL (ref 8.4–10.4)
Chloride: 99 mEq/L (ref 98–109)
Creatinine: 0.9 mg/dL (ref 0.6–1.1)
EGFR: 78 mL/min/{1.73_m2} — ABNORMAL LOW (ref >90)
Glucose: 102 mg/dl (ref 70–140)
Potassium: 3.6 mEq/L (ref 3.5–5.1)
Sodium: 137 mEq/L (ref 136–145)
Total Bilirubin: 0.68 mg/dL (ref 0.20–1.20)
Total Protein: 7.3 g/dL (ref 6.4–8.3)

## 2016-09-03 MED ORDER — GABAPENTIN 300 MG PO CAPS: ORAL_CAPSULE | ORAL | 9 refills | Status: DC

## 2016-09-03 MED ORDER — BORTEZOMIB CHEMO SQ INJECTION 3.5 MG (2.5MG/ML)
0.7800 mg/m2 | Freq: Once | INTRAMUSCULAR | Status: AC
  Administered 2016-09-03: 2 mg via SUBCUTANEOUS
  Filled 2016-09-03: qty 2

## 2016-09-03 MED ORDER — PROCHLORPERAZINE MALEATE 10 MG PO TABS
10.0000 mg | ORAL_TABLET | Freq: Once | ORAL | Status: AC
  Administered 2016-09-03: 10 mg via ORAL

## 2016-09-03 NOTE — Patient Instructions (Signed)
[facility] Discharge Instructions for Patients Receiving Chemotherapy  Today you received the following chemotherapy agents Velcade. To help prevent nausea and vomiting after your treatment, we encourage you to take your nausea medication as directed.  If you develop nausea and vomiting that is not controlled by your nausea medication, call the clinic.   BELOW ARE SYMPTOMS THAT SHOULD BE REPORTED IMMEDIATELY:  *FEVER GREATER THAN 100.5 F  *CHILLS WITH OR WITHOUT FEVER  NAUSEA AND VOMITING THAT IS NOT CONTROLLED WITH YOUR NAUSEA MEDICATION  *UNUSUAL SHORTNESS OF BREATH  *UNUSUAL BRUISING OR BLEEDING  TENDERNESS IN MOUTH AND THROAT WITH OR WITHOUT PRESENCE OF ULCERS  *URINARY PROBLEMS  *BOWEL PROBLEMS  UNUSUAL RASH Items with * indicate a potential emergency and should be followed up as soon as possible.  Feel free to call the clinic you have any questions or concerns. The clinic phone number is [phone].  Please show the CHEMO ALERT CARD at check-in to the Emergency Department and triage nurse.    

## 2016-09-04 ENCOUNTER — Other Ambulatory Visit: Payer: Self-pay | Admitting: Hematology and Oncology

## 2016-09-04 DIAGNOSIS — C9000 Multiple myeloma not having achieved remission: Secondary | ICD-10-CM

## 2016-09-06 ENCOUNTER — Other Ambulatory Visit: Payer: Self-pay | Admitting: Hematology and Oncology

## 2016-09-10 ENCOUNTER — Other Ambulatory Visit (HOSPITAL_BASED_OUTPATIENT_CLINIC_OR_DEPARTMENT_OTHER): Payer: BC Managed Care – PPO

## 2016-09-10 ENCOUNTER — Ambulatory Visit (HOSPITAL_BASED_OUTPATIENT_CLINIC_OR_DEPARTMENT_OTHER): Payer: BC Managed Care – PPO

## 2016-09-10 VITALS — BP 103/63 | HR 63 | Temp 97.7°F | Resp 18

## 2016-09-10 DIAGNOSIS — C9000 Multiple myeloma not having achieved remission: Secondary | ICD-10-CM

## 2016-09-10 DIAGNOSIS — Z5112 Encounter for antineoplastic immunotherapy: Secondary | ICD-10-CM

## 2016-09-10 LAB — CBC WITH DIFFERENTIAL/PLATELET
BASO%: 0.5 % (ref 0.0–2.0)
Basophils Absolute: 0 10*3/uL (ref 0.0–0.1)
EOS%: 8.8 % — ABNORMAL HIGH (ref 0.0–7.0)
Eosinophils Absolute: 0.4 10*3/uL (ref 0.0–0.5)
HCT: 36.1 % (ref 34.8–46.6)
HGB: 11.5 g/dL — ABNORMAL LOW (ref 11.6–15.9)
LYMPH%: 18.2 % (ref 14.0–49.7)
MCH: 28.8 pg (ref 25.1–34.0)
MCHC: 31.9 g/dL (ref 31.5–36.0)
MCV: 90.3 fL (ref 79.5–101.0)
MONO#: 0.3 10*3/uL (ref 0.1–0.9)
MONO%: 6.9 % (ref 0.0–14.0)
NEUT#: 2.8 10*3/uL (ref 1.5–6.5)
NEUT%: 65.6 % (ref 38.4–76.8)
Platelets: 226 10*3/uL (ref 145–400)
RBC: 4 10*6/uL (ref 3.70–5.45)
RDW: 14.8 % — ABNORMAL HIGH (ref 11.2–14.5)
WBC: 4.2 10*3/uL (ref 3.9–10.3)
lymph#: 0.8 10*3/uL — ABNORMAL LOW (ref 0.9–3.3)

## 2016-09-10 LAB — COMPREHENSIVE METABOLIC PANEL
ALT: 17 U/L (ref 0–55)
AST: 22 U/L (ref 5–34)
Albumin: 3.3 g/dL — ABNORMAL LOW (ref 3.5–5.0)
Alkaline Phosphatase: 60 U/L (ref 40–150)
Anion Gap: 9 mEq/L (ref 3–11)
BUN: 10.5 mg/dL (ref 7.0–26.0)
CO2: 30 mEq/L — ABNORMAL HIGH (ref 22–29)
Calcium: 9.1 mg/dL (ref 8.4–10.4)
Chloride: 100 mEq/L (ref 98–109)
Creatinine: 1 mg/dL (ref 0.6–1.1)
EGFR: 73 mL/min/{1.73_m2} — ABNORMAL LOW (ref >90)
Glucose: 87 mg/dl (ref 70–140)
Potassium: 3.3 mEq/L — ABNORMAL LOW (ref 3.5–5.1)
Sodium: 139 mEq/L (ref 136–145)
Total Bilirubin: 0.72 mg/dL (ref 0.20–1.20)
Total Protein: 7 g/dL (ref 6.4–8.3)

## 2016-09-10 MED ORDER — PROCHLORPERAZINE MALEATE 10 MG PO TABS
ORAL_TABLET | ORAL | Status: AC
  Filled 2016-09-10: qty 1

## 2016-09-10 MED ORDER — PROCHLORPERAZINE MALEATE 10 MG PO TABS
10.0000 mg | ORAL_TABLET | Freq: Once | ORAL | Status: AC
  Administered 2016-09-10: 10 mg via ORAL

## 2016-09-10 MED ORDER — BORTEZOMIB CHEMO SQ INJECTION 3.5 MG (2.5MG/ML)
0.7800 mg/m2 | Freq: Once | INTRAMUSCULAR | Status: AC
  Administered 2016-09-10: 2 mg via SUBCUTANEOUS
  Filled 2016-09-10: qty 2

## 2016-09-10 NOTE — Progress Notes (Signed)
Prior to Velcade injection patient noted "rash" to her arms and back of neck. Patient denied any itching, shortness of breath, difficulty swallowing or any other symptoms. Per patient she had a similar rash previously due to what was thought to be Revlimid. Patient had restarted Revlimid one week ago. Dr. Alen Blew notified and at bedside to see patient. Per Dr. Alen Blew, patient is not to take Revlimid tonight, Saturday or Sunday and is to contact Dr. Alvy Bimler on Monday for further instructions. Patient aware and verbalized understanding. Patient stable upon discharge.

## 2016-09-10 NOTE — Patient Instructions (Signed)
[facility] Discharge Instructions for Patients Receiving Chemotherapy  Today you received the following chemotherapy agents Velcade. To help prevent nausea and vomiting after your treatment, we encourage you to take your nausea medication as directed.  If you develop nausea and vomiting that is not controlled by your nausea medication, call the clinic.   BELOW ARE SYMPTOMS THAT SHOULD BE REPORTED IMMEDIATELY:  *FEVER GREATER THAN 100.5 F  *CHILLS WITH OR WITHOUT FEVER  NAUSEA AND VOMITING THAT IS NOT CONTROLLED WITH YOUR NAUSEA MEDICATION  *UNUSUAL SHORTNESS OF BREATH  *UNUSUAL BRUISING OR BLEEDING  TENDERNESS IN MOUTH AND THROAT WITH OR WITHOUT PRESENCE OF ULCERS  *URINARY PROBLEMS  *BOWEL PROBLEMS  UNUSUAL RASH Items with * indicate a potential emergency and should be followed up as soon as possible.  Feel free to call the clinic you have any questions or concerns. The clinic phone number is [phone].  Please show the CHEMO ALERT CARD at check-in to the Emergency Department and triage nurse.    

## 2016-09-12 ENCOUNTER — Encounter: Payer: Self-pay | Admitting: Hematology and Oncology

## 2016-09-14 ENCOUNTER — Telehealth: Payer: Self-pay | Admitting: *Deleted

## 2016-09-14 NOTE — Telephone Encounter (Signed)
-----   Message from Heath Lark, MD sent at 09/13/2016  7:13 AM EDT ----- Regarding: RE: Potential Revlimid Reaction  Thanks  Devaughn Savant, please call patient, make sure she is better. OK to take Benadryl and hold Revlimid until after transplant ----- Message ----- From: Beatriz Chancellor, RN Sent: 09/10/2016   4:24 PM To: Heath Lark, MD Subject: Potential Revlimid Reaction                    Prior to Velcade injection the patient noticed a rash to her arms and the back of her neck. She said it is very similar to the rash she broke out in last time she was on the Revlimid. She had been on this cycle of Revlimid for one week. Dr. Alen Blew saw the patient and instructed her not to take the Revlimid tonight (Friday), Saturday or Sunday. Thank you!

## 2016-09-14 NOTE — Telephone Encounter (Signed)
LM to call Dr Gorsuch's nurse 

## 2016-09-20 ENCOUNTER — Other Ambulatory Visit: Payer: Self-pay | Admitting: Hematology and Oncology

## 2016-09-23 ENCOUNTER — Telehealth: Payer: Self-pay

## 2016-09-23 ENCOUNTER — Other Ambulatory Visit: Payer: Self-pay | Admitting: Hematology and Oncology

## 2016-09-23 NOTE — Telephone Encounter (Signed)
Patient called and told Rx available to pick-up for tramadol.

## 2016-12-07 ENCOUNTER — Encounter: Payer: Self-pay | Admitting: Hematology and Oncology

## 2016-12-08 ENCOUNTER — Other Ambulatory Visit: Payer: Self-pay

## 2016-12-08 ENCOUNTER — Other Ambulatory Visit: Payer: Self-pay | Admitting: Hematology and Oncology

## 2016-12-08 MED ORDER — TRAMADOL HCL 50 MG PO TABS: 50.0000 mg | ORAL_TABLET | Freq: Four times a day (QID) | ORAL | 0 refills | Status: DC | PRN

## 2017-01-31 ENCOUNTER — Encounter: Payer: Self-pay | Admitting: Hematology and Oncology

## 2017-02-01 ENCOUNTER — Encounter: Payer: Self-pay | Admitting: Hematology and Oncology

## 2017-02-01 ENCOUNTER — Ambulatory Visit (HOSPITAL_BASED_OUTPATIENT_CLINIC_OR_DEPARTMENT_OTHER): Payer: Medicare Other | Admitting: Hematology and Oncology

## 2017-02-01 ENCOUNTER — Telehealth: Payer: Self-pay

## 2017-02-01 VITALS — BP 91/61 | HR 74 | Temp 98.4°F | Resp 18 | Ht 69.0 in | Wt 250.7 lb

## 2017-02-01 DIAGNOSIS — T451X5A Adverse effect of antineoplastic and immunosuppressive drugs, initial encounter: Secondary | ICD-10-CM

## 2017-02-01 DIAGNOSIS — Z9481 Bone marrow transplant status: Secondary | ICD-10-CM

## 2017-02-01 DIAGNOSIS — C9000 Multiple myeloma not having achieved remission: Secondary | ICD-10-CM | POA: Diagnosis not present

## 2017-02-01 DIAGNOSIS — G620 Drug-induced polyneuropathy: Secondary | ICD-10-CM | POA: Diagnosis not present

## 2017-02-01 MED ORDER — POMALIDOMIDE 4 MG PO CAPS: ORAL_CAPSULE | ORAL | 11 refills | Status: DC

## 2017-02-01 MED ORDER — DEXAMETHASONE 4 MG PO TABS: ORAL_TABLET | ORAL | 0 refills | Status: DC

## 2017-02-01 NOTE — Progress Notes (Signed)
DISCONTINUE ON PATHWAY REGIMEN - Multiple Myeloma     A cycle is every 21 days:     Bortezomib        Dose Mod: None     Lenalidomide        Dose Mod: None     Dexamethasone        Dose Mod: None  **Always confirm dose/schedule in your pharmacy ordering system**    REASON: Continuation Of Treatment PRIOR TREATMENT: [id number]: VRd (Bortezomib 1.3 mg/m2 Subcut D1, 4, 8, 11 + Lenalidomide 25 mg + Dexamethasone 20 mg) q21 Days x 4-6 Cycles Maximum Prior to Stem Cell [employer] TREATMENT RESPONSE: Partial Response (PR)  START OFF PATHWAY REGIMEN - Multiple Myeloma and Other Plasma Cell Dyscrasias   [id number]:Bortezomib:   A cycle is every 21 days:     Bortezomib   **Always confirm dose/schedule in your pharmacy ordering system**    Administration Notes: Velcade, Pomalyst, dex  Patient Characteristics: Relapsed / Refractory, All Lines of Therapy R-ISS Staging: I Disease Classification: Relapsed Line of Therapy: Second Line Intent of Therapy: Non-Curative / Palliative Intent, Discussed with Patient

## 2017-02-01 NOTE — Assessment & Plan Note (Signed)
She will continue antimicrobial prophylaxis as directed She will continue close follow-up with transplant team She was started to receive prophylactic vaccination soon

## 2017-02-01 NOTE — Assessment & Plan Note (Signed)
I have review her outside records I have spoken with her transplant physician Unfortunately, recent myeloma panel detected residual disease We would like to proceed with further treatment as consolidation option I discussed with the patient options including combination chemotherapy with Pomalyst, Velcade and dexamethasone The decision was made based on NCCN guideline, based on publication as follows:  Pomalidomide (POM), bortezomib, and low?dose dexamethasone (PVd) vs bortezomib and low-dose dexamethasone (Vd) in lenalidomide (LEN)-exposed patients (pts) with relapsed or refractory multiple myeloma (RRMM): Phase 3 OPTIMISMM trial. Sub-category: Multiple Myeloma Category: Hematologic Malignancies-Plasma Cell Dyscrasia Meeting: 2018 ASCO Annual Meeting Abstract No: [id number] Citation: J Clin Oncol 36, 2018 (suppl; abstr 8001) Author(s): Loyal Gambler. [place] Fleming Rocafiguera, Meral Beks[place], [address] [place], Monica McMullin, Fredrik [place], [place], Katja [place], Darell [place], [place], Jesus [place], [place], [place], [place], [address] [place], [address], Amine [place], [place], [place]wood [place], Meletios A. Dimopoulos, on behalf of the OPTIMISMM trial investigators; Financial risk analyst [place], Department of Medical Oncology, [place], [place]; [place], [place], [place]; [place], [place], [place], [place], [place]; [place], [place], [place], [place]; [place]. R[place], [place], [place], [place], [place]; [place], [place], [place]); [facility], [place]; [place], Community education officer of Hematology, [place], [place]; [place], [place], [place], [place]; [place], [place], [place]; [place], [place], [place], [place], [place]; [facility], [place], [place]; [place], [place], [place]; [place], [place], [place]; [employer], [place], [place]; [employer] and [employer] of [place] of Medicine, [place], [place] Abstract Disclosures Abstract:  Background: POM, a standard-of-care treatment (Tx) in RRMM, has demonstrated synergistic anti-myeloma activity with dexamethasone (DEX) and proteasome inhibitors (PIs). POM + DEX is indicated for RRMM after ? 2 prior Txs including LEN and a PI, and is the only Tx to be investigated exclusively after LEN. As LEN becomes increasingly established in up-front Tx of MM, pts who have exhausted the benefit of LEN represent a clinically relevant unmet medical need. In preclinical studies, POM inhibited proliferation of LEN-resistant cells. Here we report final PFS and safety data from the first phase 3 POM triplet trial comparing PVd vs Vd in an entirely post-LEN treated population. Methods: Pts with RRMM, 1-3 prior Tx lines, and ? 2 cycles (c) of prior LEN were randomized 1:1 to receive PVd or Vd. In 21-d c, pts received POM 4 mg/d on d 1-14 (PVd arm only); BORT 1.3 mg/m on d 1, 4, 8, and 11 of c 1-8 and on d 1 and 8 of c 9+; and DEX 20 mg/d (10 mg if aged > 75 yrs) on the days of and after BORT. The primary endpoint was PFS. Results: 559 pts were enrolled: 281 PVd and 278 Vd. Median age was 85 and 3 yrs, respectively. All pts had prior LEN (71% vs 69% LEN refractory), 72% vs 73% had prior BORT, and 70% vs 66% were refractory to last Tx. Median prior Tx lines was 2; 40% in PVd and 41% in Vd arm had 1 prior Tx line. After a median follow-up of 16 mos, PVd significantly reduced the risk of progression or death by 39%  vs Vd (Table). OS data are not mature. Most common grade 3/4 treatment-emergent AEs  were neutropenia (42% vs 9%), infections (31% vs 18%), and thrombocytopenia (27% vs 29%). Conclusions: To date, OPTIMISMM is the only phase 3 study in early RRMM to report a significant and clinically meaningful PFS improvement in pts who were entirely LEN exposed and 70% LEN refractory. Furthermore, the results showed improved benefit in pts with only 1 prior Tx line. POM safety remained manageable, consistent with its known profile. Clinical trial information: [id number]  Some of the short term & long term side-effects included, though not limited to, risk of fatigue, weight loss, risk of allergic reactions, pancytopenia, permanent damage to nerve function, life-threatening infections, need for transfusions of blood products, change in bowel habits, admission to hospital for various reasons, and risks of death.   The patient is aware that the response rates discussed earlier is not guaranteed.    After a long discussion, patient made an informed decision to proceed with the prescribed plan of care.   As discussed with her transplant physician, I plan to reduce the dose of Velcade to be given weekly, 3 weeks on 1 week off on days 1, 8 and 15, rest  Day 22 Due to prior history of neuropathy, I plan to reduce a dose to 1 mg/m subcutaneous injection along with weekly dexamethasone She will continue antimicrobial prophylaxis with acyclovir She will continue calcium with vitamin D She will proceed to get dental clearance before we resume Zometa She will continue aspirin therapy to prevent risk of thrombosis

## 2017-02-01 NOTE — Progress Notes (Signed)
[place] OFFICE PROGRESS NOTE  Patient Care Team: Lucianne Lei, MD as PCP - General (Family Medicine)  SUMMARY OF ONCOLOGIC HISTORY:   Multiple myeloma with failed remission ([place])   03/22/2016 Bone Marrow Biopsy    Bone Marrow Biopsy: Plasma cells 17% by Aspirate and 30% by CD 138 stain. Plasma cell neoplasm, Kappa Restricted Normal cytogenetics, FISH positive for +11 and +14 and +7      04/13/2016 Imaging    Skeletal survey showed lucencies within the calvarium worrisome for myeloma. No definite abnormal lytic or blastic lesions are observed elsewhere.      04/19/2016 - 09/10/2016 Chemotherapy    The patient consented to treatment with Revlimid, dexamethasone and Velcade      09/14/2016 Bone Marrow Biopsy    In summary, there is a normal cellular marrow with trilineage hematopoiesis.A mild plasmacytosis is present, but there is no definitive evidence of residual multiple myeloma      10/12/2016 - 10/12/2016 Chemotherapy    She received melphalan as conditioning chemo      10/13/2016 Bone Marrow Transplant    She received autologous stem cell [facility] at [facility]      01/24/2017 Bone Marrow Biopsy    BONE MARROW: -Normocellular marrow for age (50%) with trilineage hematopoiesis. -No morphologic or immunohistochemical evidence of involvement by plasma cell neoplasm (see comment)  PERIPHERAL BLOOD: -Unremarkable (see CBC data)  COMMENT: The bone marrow is normocellular for age and shows adequate trilineage hematopoiesis without significant (<10%) dysplastic changes present. Blasts are not increased. Plasma cells represent about 2% of total cells on aspirate smears. Appropriately controlled immunohistochemical stains are performed on the core biopsy. CD138 highlights small interstitial plasma cells comprising approximately 2% of total cells. These plasma cells appear polytypic as demonstrated by kappa and lambda in-situ  hybridization.      01/25/2017 PET scan    No FDG avid osseous lesions or masses are identified.       INTERVAL HISTORY: Please see below for problem oriented charting. She returns for further follow-up and to discuss consolidation chemotherapy She tolerated recent bone marrow transplant well She has residual peripheral neuropathy and occasional pain She denies recent infection  REVIEW OF SYSTEMS:   Constitutional: Denies fevers, chills or abnormal weight loss Eyes: Denies blurriness of vision Ears, nose, mouth, throat, and face: Denies mucositis or sore throat Respiratory: Denies cough, dyspnea or wheezes Cardiovascular: Denies palpitation, chest discomfort or lower extremity swelling Gastrointestinal:  Denies nausea, heartburn or change in bowel habits Skin: Denies abnormal skin rashes Lymphatics: Denies new lymphadenopathy or easy bruising Neurological:Denies numbness, tingling or new weaknesses Behavioral/Psych: Mood is stable, no new changes  All other systems were reviewed with the patient and are negative.  I have reviewed the past medical history, past surgical history, social history and family history with the patient and they are unchanged from previous note.  ALLERGIES:  has No Known Allergies.  MEDICATIONS:  Current Outpatient Prescriptions  Medication Sig Dispense Refill  . acyclovir (ZOVIRAX) 400 MG tablet TAKE ONE TABLET BY MOUTH TWICE DAILY (Patient taking differently: TAKE 2 TABLETS BY MOUTH TWICE DAILY) 60 tablet 3  . ASPIRIN 81 PO Take 81 mg by mouth daily.    . bisacodyl (BISACODYL) 5 MG EC tablet Take 5 mg by mouth daily as needed for moderate constipation.    Marland Kitchen dexamethasone (DECADRON) 4 MG tablet Take 40 mg once a week in the morning with food 60 tablet 0  . gabapentin (NEURONTIN) 300 MG  capsule Take 1 in the morning, 1 in the afternoon and 2 at night 90 capsule 9  . losartan-hydrochlorothiazide (HYZAAR) 100-25 MG tablet Take 1 tablet by mouth daily.  (Patient taking differently: Take 1 tablet by mouth daily. TAKES 1/2 TABLET DAILY) 90 tablet 3  . Multiple Vitamin (MULTIVITAMIN) tablet Take 1 tablet by mouth daily.    . ondansetron (ZOFRAN) 8 MG tablet Take 1 tablet (8 mg total) by mouth 2 (two) times daily as needed (Nausea or vomiting). (Patient not taking: Reported on 08/24/2016) 30 tablet 1  . pomalidomide (POMALYST) 4 MG capsule Take with water on days 1-21. Repeat every 28 days. 21 capsule 11  . prochlorperazine (COMPAZINE) 10 MG tablet Take 1 tablet (10 mg total) by mouth every 6 (six) hours as needed (Nausea or vomiting). (Patient not taking: Reported on 06/22/2016) 30 tablet 1  . Sodium Fluoride 0.15 % PSTE Use as directed in the mouth or throat.    . traMADol (ULTRAM) 50 MG tablet Take 1 tablet (50 mg total) by mouth every 6 (six) hours as needed. for pain 90 tablet 0  . triamcinolone (NASACORT) 55 MCG/ACT AERO nasal inhaler Place 2 sprays into the nose daily. (Patient taking differently: Place 2 sprays into the nose daily. Uses prn) 1 Inhaler 12   No current facility-administered medications for this visit.     PHYSICAL EXAMINATION: ECOG PERFORMANCE STATUS: 1 - Symptomatic but completely ambulatory  Vitals:   02/01/17 1339  BP: 91/61  Pulse: 74  Resp: 18  Temp: 98.4 F (36.9 C)  SpO2: 100%   Filed Weights   02/01/17 1339  Weight: 250 lb 11.2 oz (113.7 kg)    GENERAL:alert, no distress and comfortable SKIN: skin color, texture, turgor are normal, no rashes or significant lesions EYES: normal, Conjunctiva are pink and non-injected, sclera clear OROPHARYNX:no exudate, no erythema and lips, buccal mucosa, and tongue normal  NECK: supple, thyroid normal size, non-tender, without nodularity LYMPH:  no palpable lymphadenopathy in the cervical, axillary or inguinal LUNGS: clear to auscultation and percussion with normal breathing effort HEART: regular rate & rhythm and no murmurs and no lower extremity edema ABDOMEN:abdomen  soft, non-tender and normal bowel sounds Musculoskeletal:no cyanosis of digits and no clubbing  NEURO: alert & oriented x 3 with fluent speech, no focal motor/sensory deficits  LABORATORY DATA:  I have reviewed the data as listed    Component Value Date/Time   NA 139 09/10/2016 1401   K 3.3 (L) 09/10/2016 1401   CO2 30 (H) 09/10/2016 1401   GLUCOSE 87 09/10/2016 1401   BUN 10.5 09/10/2016 1401   CREATININE 1.0 09/10/2016 1401   CALCIUM 9.1 09/10/2016 1401   PROT 7.0 09/10/2016 1401   ALBUMIN 3.3 (L) 09/10/2016 1401   AST 22 09/10/2016 1401   ALT 17 09/10/2016 1401   ALKPHOS 60 09/10/2016 1401   BILITOT 0.72 09/10/2016 1401    No results found for: SPEP, UPEP  Lab Results  Component Value Date   WBC 4.2 09/10/2016   NEUTROABS 2.8 09/10/2016   HGB 11.5 (L) 09/10/2016   HCT 36.1 09/10/2016   MCV 90.3 09/10/2016   PLT 226 09/10/2016      Chemistry      Component Value Date/Time   NA 139 09/10/2016 1401   K 3.3 (L) 09/10/2016 1401   CO2 30 (H) 09/10/2016 1401   BUN 10.5 09/10/2016 1401   CREATININE 1.0 09/10/2016 1401      Component Value Date/Time   CALCIUM 9.1  09/10/2016 1401   ALKPHOS 60 09/10/2016 1401   AST 22 09/10/2016 1401   ALT 17 09/10/2016 1401   BILITOT 0.72 09/10/2016 1401      ASSESSMENT & PLAN:  Multiple myeloma with failed remission ([place]) I have review her outside records I have spoken with her transplant physician Unfortunately, recent myeloma panel detected residual disease We would like to proceed with further treatment as consolidation option I discussed with the patient options including combination chemotherapy with Pomalyst, Velcade and dexamethasone The decision was made based on NCCN guideline, based on publication as follows:  Pomalidomide (POM), bortezomib, and low?dose dexamethasone (PVd) vs bortezomib and low-dose dexamethasone (Vd) in lenalidomide (LEN)-exposed patients (pts) with relapsed or refractory multiple myeloma (RRMM):  Phase 3 OPTIMISMM trial. Sub-category: Multiple Myeloma Category: Hematologic Malignancies-Plasma Cell Dyscrasia Meeting: 2018 ASCO Annual Meeting Abstract No: [id number] Citation: J Clin Oncol 36, 2018 (suppl; abstr 8001) Author(s): Loyal Gambler. [place] Fleming R[place]figu[place], Meral [place], [address] [place], Monica [place], Fredrik [place], [place], Katja [place], Darell [place], [place], Jesus [place], [place], [place], [place], [address] [place], [address], Amine Harper, [place], Heywood Iles, Meletios A. Dimopoulos, on behalf of the OPTIMISMM trial investigators; Financial risk analyst [place], Department of Medical Oncology, [place], [place]; [place], [place], [place]; [place], [place], [place], [place], [place]; [place], [place], [place], [place]; A.O. [place], [place], [place], [place], [place]; [place], [place], [place]); [facility], [place]; [place], Community education officer of Hematology, [place], [place]; [place], [place], [place], [place]; [place], [place], [place]; [place], [place], [place], [place], [place]; [facility], [place], [place]; [place], [place], [place]; [place], [place], [place]; [employer], [place], [place]; [employer] and [employer] of [place] of Medicine, [place], [place] Abstract Disclosures Abstract:  Background: POM, a standard-of-care treatment (Tx) in RRMM, has demonstrated synergistic anti-myeloma  activity with dexamethasone (DEX) and proteasome inhibitors (PIs). POM + DEX is indicated for RRMM after ? 2 prior Txs including LEN and a PI, and is the only Tx to be investigated exclusively after LEN. As LEN becomes increasingly established in up-front Tx of MM, pts who have exhausted the benefit of LEN represent a clinically relevant unmet medical need. In preclinical studies, POM inhibited proliferation of LEN-resistant cells. Here we report final PFS and safety data from the first phase 3 POM triplet trial comparing PVd vs Vd in an entirely post-LEN treated population. Methods: Pts with RRMM, 1-3 prior Tx lines, and ? 2 cycles (c) of prior LEN were randomized 1:1 to receive PVd or Vd. In 21-d c, pts received POM 4 mg/d on d 1-14 (PVd arm only); BORT 1.3 mg/m on d 1, 4, 8, and 11 of c 1-8 and on d 1 and 8 of c 9+; and DEX 20 mg/d (10 mg if aged > 75 yrs) on the days of and after BORT. The primary endpoint was PFS. Results: 559 pts were enrolled: 281 PVd and 278 Vd. Median age was 40 and 24 yrs, respectively. All pts had prior LEN (71% vs 69% LEN refractory), 72% vs 73% had prior BORT, and 70% vs 66% were refractory to last Tx. Median prior Tx lines was 2; 40% in PVd and  41% in Vd arm had 1 prior Tx line. After a median follow-up of 16 mos, PVd significantly reduced the risk of progression or death by 39% vs Vd (Table). OS data are not mature. Most common grade 3/4 treatment-emergent AEs were neutropenia (42% vs 9%), infections (31% vs 18%), and thrombocytopenia (27% vs 29%). Conclusions: To date, OPTIMISMM is the only phase 3 study in early RRMM to report a significant and clinically meaningful PFS improvement in pts who were entirely LEN exposed and 70% LEN refractory. Furthermore, the results showed improved benefit in pts with only 1 prior Tx line. POM safety remained manageable, consistent with its known profile. Clinical trial information: [id number]  Some of the short term & long term side-effects  included, though not limited to, risk of fatigue, weight loss, risk of allergic reactions, pancytopenia, permanent damage to nerve function, life-threatening infections, need for transfusions of blood products, change in bowel habits, admission to hospital for various reasons, and risks of death.   The patient is aware that the response rates discussed earlier is not guaranteed.    After a long discussion, patient made an informed decision to proceed with the prescribed plan of care.   As discussed with her transplant physician, I plan to reduce the dose of Velcade to be given weekly, 3 weeks on 1 week off on days 1, 8 and 15, rest  Day 22 Due to prior history of neuropathy, I plan to reduce a dose to 1 mg/m subcutaneous injection along with weekly dexamethasone She will continue antimicrobial prophylaxis with acyclovir She will continue calcium with vitamin D She will proceed to get dental clearance before we resume Zometa She will continue aspirin therapy to prevent risk of thrombosis    Peripheral neuropathy due to chemotherapy [facility]) she has mild peripheral neuropathy, likely related to side effects of treatment. I plan to reduce the dose of treatment as outlined above.  I also recommend she continues taking current dose of gabapentin She will also take tramadol as needed  S/P autologous bone marrow transplantation [facility]) She will continue antimicrobial prophylaxis as directed She will continue close follow-up with transplant team She was started to receive prophylactic vaccination soon   No orders of the defined types were placed in this encounter.  All questions were answered. The patient knows to call the clinic with any problems, questions or concerns. No barriers to learning was detected. I spent 40 minutes counseling the patient face to face. The total time spent in the appointment was 60 minutes and more than 50% was on counseling and review of test results     Heath Lark,  MD 02/01/2017 4:59 PM

## 2017-02-01 NOTE — Telephone Encounter (Signed)
Called patient back regarding my chart message. Scheduling message sent for patient to see Dr. Alvy Bimler today at 2 pm.

## 2017-02-01 NOTE — Assessment & Plan Note (Signed)
she has mild peripheral neuropathy, likely related to side effects of treatment. I plan to reduce the dose of treatment as outlined above.  I also recommend she continues taking current dose of gabapentin She will also take tramadol as needed

## 2017-02-02 ENCOUNTER — Telehealth: Payer: Self-pay | Admitting: Pharmacy Technician

## 2017-02-02 ENCOUNTER — Telehealth: Payer: Self-pay | Admitting: Hematology and Oncology

## 2017-02-02 NOTE — Telephone Encounter (Signed)
Oral Oncology Patient Advocate Encounter  Prior Authorization for Pomalyst has been approved.    PA# [id number] Effective dates: 02/02/2017 through 05/16/2017  Oral Oncology Clinic will continue to follow. Fabio Asa. Melynda Keller, [place] Patient [place] [phone] 02/02/2017 11:57 AM

## 2017-02-02 NOTE — Telephone Encounter (Signed)
Return for No new orders or return visit per 9/18 los.

## 2017-02-03 ENCOUNTER — Telehealth: Payer: Self-pay | Admitting: Pharmacist

## 2017-02-03 ENCOUNTER — Other Ambulatory Visit: Payer: Self-pay | Admitting: *Deleted

## 2017-02-03 MED ORDER — POMALIDOMIDE 4 MG PO CAPS: ORAL_CAPSULE | ORAL | 11 refills | Status: DC

## 2017-02-03 NOTE — Telephone Encounter (Signed)
Oral Oncology Pharmacist Encounter  Received new prescription for for Carolyn Newman for pomalidomide for the treatment of relapsed multiple myeloma. She will also be taking dexamethasone 58m PO once every 7 days and bortezomib 116mm2 [place] every 7 days. Dose of bortezomib was empirically reduced for existing neuropathy.  Treatment will be ongoing until disease progression or unacceptable toxicity.   Last labs were from April 2018 and have been assessed.  Current medication list in Epic reviewed, no significant drug interactions noted. She is also on acyclovir for herpes zoster prophylaxis and aspirin for VTE prophylaxis.   Prescription has been e-scribed to [facility] [place] to drug being limited distribution. PA has been approved.   I spoke with patient for overview of pomalidomide.   Counseled patient on administration, dosing, side effects, safe handling, and monitoring.  Patient will take Pomalidomide 55m68mO days 1-21 every 28 days without regard to food. Start date: pending - [name] not have any appointments yet for her Velcade treatment and has not yet been called by scheduling. We discussed that her start date will likely be the date of her first Velcade injection.   Side effects include but not limited to: anemia, neutropenia, nausea, diarrhea. We also discussed rare but serious side effect of VTE, patient started taking aspirin 21m57md understood the importance of continuing this. She also has history of rash with Revlimid. Educated that risk of rash is lower with pomalidomide, but she will need to let us kKoreaw if this happens again since the drugs are related.   Reviewed with patient importance of keeping a medication schedule and plan for any missed doses.  Carolyn Newman understanding and appreciation.   All questions answered.  Patient knows to call the office with questions or concerns. Oral Oncology Clinic will continue to follow.  Thank  you,  MaggDemetrius CharityarmD PGY2 Oncology Pharmacy Resident  Pharmacy Phone: [phone]

## 2017-02-04 ENCOUNTER — Other Ambulatory Visit: Payer: Self-pay | Admitting: Pharmacist

## 2017-02-07 ENCOUNTER — Other Ambulatory Visit: Payer: Self-pay | Admitting: Hematology and Oncology

## 2017-02-07 ENCOUNTER — Encounter: Payer: Self-pay | Admitting: Hematology and Oncology

## 2017-02-07 ENCOUNTER — Telehealth: Payer: Self-pay | Admitting: *Deleted

## 2017-02-07 NOTE — Telephone Encounter (Signed)
Hi Maggie,  Can you verify when the drug can be delivered?  Tammi,  Can you ask when she is ready to start?  I will place scheduling msg and give instructions re: dex after I know when we can start

## 2017-02-07 NOTE — Telephone Encounter (Signed)
Left message for Carolyn Newman to call us when she is ready to start pomalyst. Dr Alvy Bimler will give her instructions re: dex and place a schedule message.

## 2017-02-08 ENCOUNTER — Telehealth: Payer: Self-pay

## 2017-02-08 ENCOUNTER — Other Ambulatory Visit: Payer: Self-pay | Admitting: Hematology and Oncology

## 2017-02-08 ENCOUNTER — Telehealth: Payer: Self-pay | Admitting: Hematology and Oncology

## 2017-02-08 DIAGNOSIS — C9000 Multiple myeloma not having achieved remission: Secondary | ICD-10-CM

## 2017-02-08 NOTE — Telephone Encounter (Signed)
Going to PCP today to regulate her losartin. She has f/u appt with her dentist Thursday.   S/w pt about Dr Calton Dach directions in [place] email from yesterday.   [facility] took [facility] new insurance information and is processing the prescription.

## 2017-02-08 NOTE — Telephone Encounter (Signed)
Scheduled appt per 9/25 sch message - left message with appt date and time and sent reminder letter in the mail.

## 2017-02-09 ENCOUNTER — Encounter: Payer: Self-pay | Admitting: Hematology and Oncology

## 2017-02-09 MED ORDER — TRAMADOL HCL 50 MG PO TABS: 50.0000 mg | ORAL_TABLET | Freq: Four times a day (QID) | ORAL | 0 refills | Status: DC | PRN

## 2017-02-11 ENCOUNTER — Encounter: Payer: Self-pay | Admitting: Hematology and Oncology

## 2017-02-14 ENCOUNTER — Other Ambulatory Visit: Payer: Self-pay | Admitting: Hematology and Oncology

## 2017-02-18 ENCOUNTER — Ambulatory Visit (HOSPITAL_BASED_OUTPATIENT_CLINIC_OR_DEPARTMENT_OTHER): Payer: Medicare Other

## 2017-02-18 ENCOUNTER — Other Ambulatory Visit: Payer: Self-pay

## 2017-02-18 ENCOUNTER — Encounter: Payer: Self-pay | Admitting: Hematology and Oncology

## 2017-02-18 ENCOUNTER — Other Ambulatory Visit (HOSPITAL_BASED_OUTPATIENT_CLINIC_OR_DEPARTMENT_OTHER): Payer: Medicare Other

## 2017-02-18 VITALS — BP 111/76 | HR 83 | Temp 98.1°F | Resp 18

## 2017-02-18 DIAGNOSIS — C9000 Multiple myeloma not having achieved remission: Secondary | ICD-10-CM

## 2017-02-18 DIAGNOSIS — Z5112 Encounter for antineoplastic immunotherapy: Secondary | ICD-10-CM

## 2017-02-18 LAB — CBC WITH DIFFERENTIAL/PLATELET
BASO%: 0.2 % (ref 0.0–2.0)
Basophils Absolute: 0 10*3/uL (ref 0.0–0.1)
EOS%: 0.1 % (ref 0.0–7.0)
Eosinophils Absolute: 0 10*3/uL (ref 0.0–0.5)
HCT: 38.6 % (ref 34.8–46.6)
HGB: 12.5 g/dL (ref 11.6–15.9)
LYMPH%: 7.4 % — ABNORMAL LOW (ref 14.0–49.7)
MCH: 28.9 pg (ref 25.1–34.0)
MCHC: 32.5 g/dL (ref 31.5–36.0)
MCV: 89.1 fL (ref 79.5–101.0)
MONO#: 0 10*3/uL — ABNORMAL LOW (ref 0.1–0.9)
MONO%: 0.7 % (ref 0.0–14.0)
NEUT#: 5.2 10*3/uL (ref 1.5–6.5)
NEUT%: 91.6 % — ABNORMAL HIGH (ref 38.4–76.8)
Platelets: 225 10*3/uL (ref 145–400)
RBC: 4.33 10*6/uL (ref 3.70–5.45)
RDW: 14.2 % (ref 11.2–14.5)
WBC: 5.7 10*3/uL (ref 3.9–10.3)
lymph#: 0.4 10*3/uL — ABNORMAL LOW (ref 0.9–3.3)

## 2017-02-18 LAB — COMPREHENSIVE METABOLIC PANEL
ALT: 19 U/L (ref 0–55)
AST: 26 U/L (ref 5–34)
Albumin: 4 g/dL (ref 3.5–5.0)
Alkaline Phosphatase: 107 U/L (ref 40–150)
Anion Gap: 10 mEq/L (ref 3–11)
BUN: 17.1 mg/dL (ref 7.0–26.0)
CO2: 27 mEq/L (ref 22–29)
Calcium: 10.3 mg/dL (ref 8.4–10.4)
Chloride: 103 mEq/L (ref 98–109)
Creatinine: 1.1 mg/dL (ref 0.6–1.1)
EGFR: 58 mL/min/{1.73_m2} — ABNORMAL LOW (ref >90)
Glucose: 113 mg/dl (ref 70–140)
Potassium: 3.9 mEq/L (ref 3.5–5.1)
Sodium: 139 mEq/L (ref 136–145)
Total Bilirubin: 0.37 mg/dL (ref 0.20–1.20)
Total Protein: 7.7 g/dL (ref 6.4–8.3)

## 2017-02-18 MED ORDER — PROCHLORPERAZINE MALEATE 10 MG PO TABS
10.0000 mg | ORAL_TABLET | Freq: Once | ORAL | Status: AC
  Administered 2017-02-18: 10 mg via ORAL

## 2017-02-18 MED ORDER — PROCHLORPERAZINE MALEATE 10 MG PO TABS
ORAL_TABLET | ORAL | Status: AC
  Filled 2017-02-18: qty 1

## 2017-02-18 MED ORDER — BORTEZOMIB CHEMO SQ INJECTION 3.5 MG (2.5MG/ML)
1.0400 mg/m2 | Freq: Once | INTRAMUSCULAR | Status: AC
  Administered 2017-02-18: 2.5 mg via SUBCUTANEOUS
  Filled 2017-02-18: qty 2.5

## 2017-02-18 MED ORDER — GABAPENTIN 300 MG PO CAPS: ORAL_CAPSULE | ORAL | 3 refills | Status: DC

## 2017-02-18 NOTE — Patient Instructions (Signed)

## 2017-02-21 LAB — KAPPA/LAMBDA LIGHT CHAINS
Ig Kappa Free Light Chain: 10.2 mg/L (ref 3.3–19.4)
Ig Lambda Free Light Chain: 7.7 mg/L (ref 5.7–26.3)
Kappa/Lambda FluidC Ratio: 1.32 (ref 0.26–1.65)

## 2017-02-23 LAB — MULTIPLE MYELOMA PANEL, SERUM
Albumin SerPl Elph-Mcnc: 3.5 g/dL (ref 2.9–4.4)
Albumin/Glob SerPl: 1.1 (ref 0.7–1.7)
Alpha 1: 0.2 g/dL (ref 0.0–0.4)
Alpha2 Glob SerPl Elph-Mcnc: 1 g/dL (ref 0.4–1.0)
B-Globulin SerPl Elph-Mcnc: 1.2 g/dL (ref 0.7–1.3)
Gamma Glob SerPl Elph-Mcnc: 0.8 g/dL (ref 0.4–1.8)
Globulin, Total: 3.2 g/dL (ref 2.2–3.9)
IgA, Qn, Serum: 61 mg/dL — ABNORMAL LOW (ref 87–352)
IgG, Qn, Serum: 976 mg/dL (ref 700–1600)
IgM, Qn, Serum: 20 mg/dL — ABNORMAL LOW (ref 26–217)
M Protein SerPl Elph-Mcnc: 0.2 g/dL — ABNORMAL HIGH
Total Protein: 6.7 g/dL (ref 6.0–8.5)

## 2017-02-25 ENCOUNTER — Ambulatory Visit (HOSPITAL_BASED_OUTPATIENT_CLINIC_OR_DEPARTMENT_OTHER): Payer: Medicare Other

## 2017-02-25 ENCOUNTER — Encounter: Payer: Self-pay | Admitting: Hematology and Oncology

## 2017-02-25 ENCOUNTER — Other Ambulatory Visit (HOSPITAL_BASED_OUTPATIENT_CLINIC_OR_DEPARTMENT_OTHER): Payer: Medicare Other

## 2017-02-25 ENCOUNTER — Ambulatory Visit (HOSPITAL_BASED_OUTPATIENT_CLINIC_OR_DEPARTMENT_OTHER): Payer: Medicare Other | Admitting: Hematology and Oncology

## 2017-02-25 DIAGNOSIS — C9000 Multiple myeloma not having achieved remission: Secondary | ICD-10-CM

## 2017-02-25 DIAGNOSIS — G620 Drug-induced polyneuropathy: Secondary | ICD-10-CM | POA: Diagnosis not present

## 2017-02-25 DIAGNOSIS — Z5112 Encounter for antineoplastic immunotherapy: Secondary | ICD-10-CM

## 2017-02-25 DIAGNOSIS — T451X5A Adverse effect of antineoplastic and immunosuppressive drugs, initial encounter: Secondary | ICD-10-CM | POA: Diagnosis not present

## 2017-02-25 LAB — CBC WITH DIFFERENTIAL/PLATELET
BASO%: 0.2 % (ref 0.0–2.0)
Basophils Absolute: 0 10*3/uL (ref 0.0–0.1)
EOS%: 0.9 % (ref 0.0–7.0)
Eosinophils Absolute: 0 10*3/uL (ref 0.0–0.5)
HCT: 37.8 % (ref 34.8–46.6)
HGB: 12.1 g/dL (ref 11.6–15.9)
LYMPH%: 9.4 % — ABNORMAL LOW (ref 14.0–49.7)
MCH: 28.5 pg (ref 25.1–34.0)
MCHC: 32.1 g/dL (ref 31.5–36.0)
MCV: 88.8 fL (ref 79.5–101.0)
MONO#: 0.1 10*3/uL (ref 0.1–0.9)
MONO%: 1.1 % (ref 0.0–14.0)
NEUT#: 4.4 10*3/uL (ref 1.5–6.5)
NEUT%: 88.4 % — ABNORMAL HIGH (ref 38.4–76.8)
Platelets: 215 10*3/uL (ref 145–400)
RBC: 4.25 10*6/uL (ref 3.70–5.45)
RDW: 14 % (ref 11.2–14.5)
WBC: 5 10*3/uL (ref 3.9–10.3)
lymph#: 0.5 10*3/uL — ABNORMAL LOW (ref 0.9–3.3)

## 2017-02-25 LAB — COMPREHENSIVE METABOLIC PANEL
ALT: 17 U/L (ref 0–55)
AST: 22 U/L (ref 5–34)
Albumin: 3.6 g/dL (ref 3.5–5.0)
Alkaline Phosphatase: 89 U/L (ref 40–150)
Anion Gap: 9 mEq/L (ref 3–11)
BUN: 14.6 mg/dL (ref 7.0–26.0)
CO2: 28 mEq/L (ref 22–29)
Calcium: 9.6 mg/dL (ref 8.4–10.4)
Chloride: 102 mEq/L (ref 98–109)
Creatinine: 0.9 mg/dL (ref 0.6–1.1)
EGFR: >60 mL/min/{1.73_m2} (ref >60)
Glucose: 114 mg/dl (ref 70–140)
Potassium: 4.4 mEq/L (ref 3.5–5.1)
Sodium: 139 mEq/L (ref 136–145)
Total Bilirubin: 0.33 mg/dL (ref 0.20–1.20)
Total Protein: 6.9 g/dL (ref 6.4–8.3)

## 2017-02-25 MED ORDER — ZOLEDRONIC ACID 4 MG/100ML IV SOLN
4.0000 mg | Freq: Once | INTRAVENOUS | Status: AC
  Administered 2017-02-25: 4 mg via INTRAVENOUS
  Filled 2017-02-25: qty 100

## 2017-02-25 MED ORDER — SODIUM CHLORIDE 0.9 % IV SOLN
Freq: Once | INTRAVENOUS | Status: AC
  Administered 2017-02-25: 13:00:00 via INTRAVENOUS

## 2017-02-25 MED ORDER — BORTEZOMIB CHEMO SQ INJECTION 3.5 MG (2.5MG/ML)
1.0400 mg/m2 | Freq: Once | INTRAMUSCULAR | Status: AC
  Administered 2017-02-25: 2.5 mg via SUBCUTANEOUS
  Filled 2017-02-25: qty 2.5

## 2017-02-25 MED ORDER — PROCHLORPERAZINE MALEATE 10 MG PO TABS
10.0000 mg | ORAL_TABLET | Freq: Once | ORAL | Status: AC
  Administered 2017-02-25: 10 mg via ORAL

## 2017-02-25 MED ORDER — PROCHLORPERAZINE MALEATE 10 MG PO TABS
ORAL_TABLET | ORAL | Status: AC
  Filled 2017-02-25: qty 1

## 2017-02-25 MED ORDER — ACYCLOVIR 400 MG PO TABS: 400.0000 mg | ORAL_TABLET | Freq: Two times a day (BID) | ORAL | 9 refills | Status: DC

## 2017-02-25 NOTE — Patient Instructions (Signed)
[facility] Discharge Instructions for Patients Receiving Chemotherapy  Today you received the following chemotherapy agents Velcade  To help prevent nausea and vomiting after your treatment, we encourage you to take your nausea medication as directed If you develop nausea and vomiting that is not controlled by your nausea medication, call the clinic.   BELOW ARE SYMPTOMS THAT SHOULD BE REPORTED IMMEDIATELY:  *FEVER GREATER THAN 100.5 F  *CHILLS WITH OR WITHOUT FEVER  NAUSEA AND VOMITING THAT IS NOT CONTROLLED WITH YOUR NAUSEA MEDICATION  *UNUSUAL SHORTNESS OF BREATH  *UNUSUAL BRUISING OR BLEEDING  TENDERNESS IN MOUTH AND THROAT WITH OR WITHOUT PRESENCE OF ULCERS  *URINARY PROBLEMS  *BOWEL PROBLEMS  UNUSUAL RASH Items with * indicate a potential emergency and should be followed up as soon as possible.  Feel free to call the clinic should you have any questions or concerns. The clinic phone number is [phone].  Please show the CHEMO ALERT CARD at check-in to the Emergency Department and triage nurse.   Zoledronic Acid injection (Hypercalcemia, Oncology) What is this medicine? ZOLEDRONIC ACID (ZOE le dron ik AS id) lowers the amount of calcium loss from bone. It is used to treat too much calcium in your blood from cancer. It is also used to prevent complications of cancer that has spread to the bone. This medicine may be used for other purposes; ask your health care provider or pharmacist if you have questions. COMMON BRAND NAME(S): Zometa What should I tell my health care provider before I take this medicine? They need to know if you have any of these conditions: -aspirin-sensitive asthma -cancer, especially if you are receiving medicines used to treat cancer -dental disease or wear dentures -infection -kidney disease -receiving corticosteroids like dexamethasone or prednisone -an unusual or allergic reaction to zoledronic acid, other medicines,  foods, dyes, or preservatives -pregnant or trying to get pregnant -breast-feeding How should I use this medicine? This medicine is for infusion into a vein. It is given by a health care professional in a hospital or clinic setting. Talk to your pediatrician regarding the use of this medicine in children. Special care may be needed. Overdosage: If you think you have taken too much of this medicine contact a poison control center or emergency room at once. NOTE: This medicine is only for you. Do not share this medicine with others. What if I miss a dose? It is important not to miss your dose. Call your doctor or health care professional if you are unable to keep an appointment. What may interact with this medicine? -certain antibiotics given by injection -NSAIDs, medicines for pain and inflammation, like ibuprofen or naproxen -some diuretics like bumetanide, furosemide -teriparatide -thalidomide This list may not describe all possible interactions. Give your health care provider a list of all the medicines, herbs, non-prescription drugs, or dietary supplements you use. Also tell them if you smoke, drink alcohol, or use illegal drugs. Some items may interact with your medicine. What should I watch for while using this medicine? Visit your doctor or health care professional for regular checkups. It may be some time before you see the benefit from this medicine. Do not stop taking your medicine unless your doctor tells you to. Your doctor may order blood tests or other tests to see how you are doing. Women should inform their doctor if they wish to become pregnant or think they might be pregnant. There is a potential for serious side effects to an unborn child. Talk to your   health care professional or pharmacist for more information. You should make sure that you get enough calcium and vitamin D while you are taking this medicine. Discuss the foods you eat and the vitamins you take with your health  care professional. Some people who take this medicine have severe bone, joint, and/or muscle pain. This medicine may also increase your risk for jaw problems or a broken thigh bone. Tell your doctor right away if you have severe pain in your jaw, bones, joints, or muscles. Tell your doctor if you have any pain that does not go away or that gets worse. Tell your dentist and dental surgeon that you are taking this medicine. You should not have major dental surgery while on this medicine. See your dentist to have a dental exam and fix any dental problems before starting this medicine. Take good care of your teeth while on this medicine. Make sure you see your dentist for regular follow-up appointments. What side effects may I notice from receiving this medicine? Side effects that you should report to your doctor or health care professional as soon as possible: -allergic reactions like skin rash, itching or hives, swelling of the face, lips, or tongue -anxiety, confusion, or depression -breathing problems -changes in vision -eye pain -feeling faint or lightheaded, falls -jaw pain, especially after dental work -mouth sores -muscle cramps, stiffness, or weakness -redness, blistering, peeling or loosening of the skin, including inside the mouth -trouble passing urine or change in the amount of urine Side effects that usually do not require medical attention (report to your doctor or health care professional if they continue or are bothersome): -bone, joint, or muscle pain -constipation -diarrhea -fever -hair loss -irritation at site where injected -loss of appetite -nausea, vomiting -stomach upset -trouble sleeping -trouble swallowing -weak or tired This list may not describe all possible side effects. Call your doctor for medical advice about side effects. You may report side effects to FDA at 1-800-FDA-1088. Where should I keep my medicine? This drug is given in a hospital or clinic and  will not be stored at home. NOTE: This sheet is a summary. It may not cover all possible information. If you have questions about this medicine, talk to your doctor, pharmacist, or health care provider.  2018 Elsevier/Gold Standard (2013-09-29 14:19:39)    

## 2017-02-25 NOTE — Assessment & Plan Note (Signed)
she has mild peripheral neuropathy, likely related to side effects of treatment. I plan to reduce the dose of treatment as outlined above.  I also recommend she continues taking current dose of gabapentin She will also take tramadol as needed

## 2017-02-25 NOTE — Assessment & Plan Note (Signed)
She tolerated treatment well She has mild peripheral neuropathy. She will continue reduced dose Velcade weekly She will continue calcium with vitamin D supplement and Zometa every 3 months, next due today She will take dexamethasone weekly and Pomalyst on days 1-21, rest 7 days She will take acyclovir for antimicrobial prophylaxis and aspirin for DVT prophylaxis Her recent myeloma panel show continued improvement of disease control with VGPR

## 2017-02-25 NOTE — Progress Notes (Signed)
[place] OFFICE PROGRESS NOTE  Patient Care Team: Lucianne Lei, MD as PCP - General (Family Medicine)  SUMMARY OF ONCOLOGIC HISTORY:   Multiple myeloma with failed remission ([place])   03/22/2016 Bone Marrow Biopsy    Bone Marrow Biopsy: Plasma cells 17% by Aspirate and 30% by CD 138 stain. Plasma cell neoplasm, Kappa Restricted Normal cytogenetics, FISH positive for +11 and +14 and +7      04/13/2016 Imaging    Skeletal survey showed lucencies within the calvarium worrisome for myeloma. No definite abnormal lytic or blastic lesions are observed elsewhere.      04/19/2016 - 09/10/2016 Chemotherapy    The patient consented to treatment with Revlimid, dexamethasone and Velcade      09/14/2016 Bone Marrow Biopsy    In summary, there is a normal cellular marrow with trilineage hematopoiesis.A mild plasmacytosis is present, but there is no definitive evidence of residual multiple myeloma      10/12/2016 - 10/12/2016 Chemotherapy    She received melphalan as conditioning chemo      10/13/2016 Bone Marrow Transplant    She received autologous stem cell transplant at [facility]      01/24/2017 Bone Marrow Biopsy    BONE MARROW: -Normocellular marrow for age (50%) with trilineage hematopoiesis. -No morphologic or immunohistochemical evidence of involvement by plasma cell neoplasm (see comment)  PERIPHERAL BLOOD: -Unremarkable (see CBC data)  COMMENT: The bone marrow is normocellular for age and shows adequate trilineage hematopoiesis without significant (<10%) dysplastic changes present. Blasts are not increased. Plasma cells represent about 2% of total cells on aspirate smears. Appropriately controlled immunohistochemical stains are performed on the core biopsy. CD138 highlights small interstitial plasma cells comprising approximately 2% of total cells. These plasma cells appear polytypic as demonstrated by kappa and lambda in-situ  hybridization.      01/25/2017 PET scan    No FDG avid osseous lesions or masses are identified.      02/18/2017 -  Chemotherapy    She received weekly Velcade, Pomalyst and Dexamethasone       INTERVAL HISTORY: Please see below for problem oriented charting. She returns for chemo She tolerated Pomalyst well No recent infection No new bone pain She has mild neuropathy, not worse No recent infection  REVIEW OF SYSTEMS:   Constitutional: Denies fevers, chills or abnormal weight loss Eyes: Denies blurriness of vision Ears, nose, mouth, throat, and face: Denies mucositis or sore throat Respiratory: Denies cough, dyspnea or wheezes Cardiovascular: Denies palpitation, chest discomfort or lower extremity swelling Gastrointestinal:  Denies nausea, heartburn or change in bowel habits Skin: Denies abnormal skin rashes Lymphatics: Denies new lymphadenopathy or easy bruising Neurological:Denies numbness, tingling or new weaknesses Behavioral/Psych: Mood is stable, no new changes  All other systems were reviewed with the patient and are negative.  I have reviewed the past medical history, past surgical history, social history and family history with the patient and they are unchanged from previous note.  ALLERGIES:  is allergic to revlimid [lenalidomide].  MEDICATIONS:  Current Outpatient Prescriptions  Medication Sig Dispense Refill  . acyclovir (ZOVIRAX) 400 MG tablet Take 1 tablet (400 mg total) by mouth 2 (two) times daily. 90 tablet 9  . ASPIRIN 81 PO Take 81 mg by mouth daily.    . bisacodyl (BISACODYL) 5 MG EC tablet Take 5 mg by mouth daily as needed for moderate constipation.    Marland Kitchen dexamethasone (DECADRON) 4 MG tablet Take 40 mg once a week in the morning with  food 60 tablet 0  . gabapentin (NEURONTIN) 300 MG capsule Take 1 in the morning, 1 in the afternoon and 2 at night 90 capsule 3  . losartan-hydrochlorothiazide (HYZAAR) 100-25 MG tablet Take 1 tablet by mouth daily.  (Patient taking differently: Take 1 tablet by mouth daily. TAKES 1/2 TABLET DAILY) 90 tablet 3  . Multiple Vitamin (MULTIVITAMIN) tablet Take 1 tablet by mouth daily.    . ondansetron (ZOFRAN) 8 MG tablet Take 1 tablet (8 mg total) by mouth 2 (two) times daily as needed (Nausea or vomiting). (Patient not taking: Reported on 08/24/2016) 30 tablet 1  . pomalidomide (POMALYST) 4 MG capsule Take with water on days 1-21. Repeat every 28 days. 21 capsule 11  . prochlorperazine (COMPAZINE) 10 MG tablet Take 1 tablet (10 mg total) by mouth every 6 (six) hours as needed (Nausea or vomiting). (Patient not taking: Reported on 06/22/2016) 30 tablet 1  . Sodium Fluoride 0.15 % PSTE Use as directed in the mouth or throat.    . traMADol (ULTRAM) 50 MG tablet Take 1 tablet (50 mg total) by mouth every 6 (six) hours as needed. for pain 90 tablet 0  . triamcinolone (NASACORT) 55 MCG/ACT AERO nasal inhaler Place 2 sprays into the nose daily. (Patient taking differently: Place 2 sprays into the nose daily. Uses prn) 1 Inhaler 12   No current facility-administered medications for this visit.     PHYSICAL EXAMINATION: ECOG PERFORMANCE STATUS: 0 - Asymptomatic  Vitals:   02/25/17 1226  BP: (!) 118/51  Pulse: 71  Resp: 18  Temp: 98.2 F (36.8 C)  SpO2: 100%   Filed Weights   02/25/17 1226  Weight: 261 lb 9.6 oz (118.7 kg)    GENERAL:alert, no distress and comfortable SKIN: skin color, texture, turgor are normal, no rashes or significant lesions EYES: normal, Conjunctiva are pink and non-injected, sclera clear OROPHARYNX:no exudate, no erythema and lips, buccal mucosa, and tongue normal  NECK: supple, thyroid normal size, non-tender, without nodularity LYMPH:  no palpable lymphadenopathy in the cervical, axillary or inguinal LUNGS: clear to auscultation and percussion with normal breathing effort HEART: regular rate & rhythm and no murmurs and no lower extremity edema ABDOMEN:abdomen soft, non-tender and  normal bowel sounds Musculoskeletal:no cyanosis of digits and no clubbing  NEURO: alert & oriented x 3 with fluent speech, no focal motor/sensory deficits  LABORATORY DATA:  I have reviewed the data as listed    Component Value Date/Time   NA 139 02/25/2017 1214   K 4.4 02/25/2017 1214   CO2 28 02/25/2017 1214   GLUCOSE 114 02/25/2017 1214   BUN 14.6 02/25/2017 1214   CREATININE 0.9 02/25/2017 1214   CALCIUM 9.6 02/25/2017 1214   PROT 6.9 02/25/2017 1214   ALBUMIN 3.6 02/25/2017 1214   AST 22 02/25/2017 1214   ALT 17 02/25/2017 1214   ALKPHOS 89 02/25/2017 1214   BILITOT 0.33 02/25/2017 1214    No results found for: SPEP, UPEP  Lab Results  Component Value Date   WBC 5.0 02/25/2017   NEUTROABS 4.4 02/25/2017   HGB 12.1 02/25/2017   HCT 37.8 02/25/2017   MCV 88.8 02/25/2017   PLT 215 02/25/2017      Chemistry      Component Value Date/Time   NA 139 02/25/2017 1214   K 4.4 02/25/2017 1214   CO2 28 02/25/2017 1214   BUN 14.6 02/25/2017 1214   CREATININE 0.9 02/25/2017 1214      Component Value Date/Time  CALCIUM 9.6 02/25/2017 1214   ALKPHOS 89 02/25/2017 1214   AST 22 02/25/2017 1214   ALT 17 02/25/2017 1214   BILITOT 0.33 02/25/2017 1214      ASSESSMENT & PLAN:  Multiple myeloma with failed remission ([place]) She tolerated treatment well She has mild peripheral neuropathy. She will continue reduced dose Velcade weekly She will continue calcium with vitamin D supplement and Zometa every 3 months, next due today She will take dexamethasone weekly and Pomalyst on days 1-21, rest 7 days She will take acyclovir for antimicrobial prophylaxis and aspirin for DVT prophylaxis Her recent myeloma panel show continued improvement of disease control with VGPR  Peripheral neuropathy due to chemotherapy [facility]) she has mild peripheral neuropathy, likely related to side effects of treatment. I plan to reduce the dose of treatment as outlined above.  I also recommend she  continues taking current dose of gabapentin She will also take tramadol as needed   No orders of the defined types were placed in this encounter.  All questions were answered. The patient knows to call the clinic with any problems, questions or concerns. No barriers to learning was detected. I spent 15 minutes counseling the patient face to face. The total time spent in the appointment was 20 minutes and more than 50% was on counseling and review of test results     Heath Lark, MD 02/25/2017 12:54 PM

## 2017-02-28 ENCOUNTER — Other Ambulatory Visit: Payer: Self-pay | Admitting: Hematology and Oncology

## 2017-02-28 NOTE — Telephone Encounter (Signed)
-

## 2017-03-02 ENCOUNTER — Encounter: Payer: Self-pay | Admitting: Hematology and Oncology

## 2017-03-04 ENCOUNTER — Ambulatory Visit (HOSPITAL_BASED_OUTPATIENT_CLINIC_OR_DEPARTMENT_OTHER): Payer: Medicare Other

## 2017-03-04 ENCOUNTER — Other Ambulatory Visit (HOSPITAL_BASED_OUTPATIENT_CLINIC_OR_DEPARTMENT_OTHER): Payer: Medicare Other

## 2017-03-04 VITALS — BP 116/60 | HR 54 | Temp 98.7°F | Resp 17

## 2017-03-04 DIAGNOSIS — C9000 Multiple myeloma not having achieved remission: Secondary | ICD-10-CM | POA: Diagnosis not present

## 2017-03-04 DIAGNOSIS — Z5112 Encounter for antineoplastic immunotherapy: Secondary | ICD-10-CM | POA: Diagnosis not present

## 2017-03-04 LAB — CBC WITH DIFFERENTIAL/PLATELET
BASO%: 0.4 % (ref 0.0–2.0)
Basophils Absolute: 0 10*3/uL (ref 0.0–0.1)
EOS%: 6.5 % (ref 0.0–7.0)
Eosinophils Absolute: 0.2 10*3/uL (ref 0.0–0.5)
HCT: 35.1 % (ref 34.8–46.6)
HGB: 11.4 g/dL — ABNORMAL LOW (ref 11.6–15.9)
LYMPH%: 18.8 % (ref 14.0–49.7)
MCH: 28.5 pg (ref 25.1–34.0)
MCHC: 32.5 g/dL (ref 31.5–36.0)
MCV: 87.7 fL (ref 79.5–101.0)
MONO#: 0.6 10*3/uL (ref 0.1–0.9)
MONO%: 16.6 % — ABNORMAL HIGH (ref 0.0–14.0)
NEUT#: 2.1 10*3/uL (ref 1.5–6.5)
NEUT%: 57.7 % (ref 38.4–76.8)
Platelets: 190 10*3/uL (ref 145–400)
RBC: 4 10*6/uL (ref 3.70–5.45)
RDW: 14.3 % (ref 11.2–14.5)
WBC: 3.6 10*3/uL — ABNORMAL LOW (ref 3.9–10.3)
lymph#: 0.7 10*3/uL — ABNORMAL LOW (ref 0.9–3.3)

## 2017-03-04 LAB — COMPREHENSIVE METABOLIC PANEL
ALT: 25 U/L (ref 0–55)
AST: 27 U/L (ref 5–34)
Albumin: 3.4 g/dL — ABNORMAL LOW (ref 3.5–5.0)
Alkaline Phosphatase: 81 U/L (ref 40–150)
Anion Gap: 8 mEq/L (ref 3–11)
BUN: 17 mg/dL (ref 7.0–26.0)
CO2: 28 mEq/L (ref 22–29)
Calcium: 9 mg/dL (ref 8.4–10.4)
Chloride: 102 mEq/L (ref 98–109)
Creatinine: 0.9 mg/dL (ref 0.6–1.1)
EGFR: >60 mL/min/{1.73_m2} (ref >60)
Glucose: 72 mg/dl (ref 70–140)
Potassium: 3.9 mEq/L (ref 3.5–5.1)
Sodium: 138 mEq/L (ref 136–145)
Total Bilirubin: 0.46 mg/dL (ref 0.20–1.20)
Total Protein: 6.6 g/dL (ref 6.4–8.3)

## 2017-03-04 MED ORDER — PROCHLORPERAZINE MALEATE 10 MG PO TABS
10.0000 mg | ORAL_TABLET | Freq: Once | ORAL | Status: AC
  Administered 2017-03-04: 10 mg via ORAL

## 2017-03-04 MED ORDER — BORTEZOMIB CHEMO SQ INJECTION 3.5 MG (2.5MG/ML)
1.0400 mg/m2 | Freq: Once | INTRAMUSCULAR | Status: AC
  Administered 2017-03-04: 2.5 mg via SUBCUTANEOUS
  Filled 2017-03-04: qty 2.5

## 2017-03-04 NOTE — Patient Instructions (Signed)
[facility]cer Center Discharge Instructions for Patients Receiving Chemotherapy  Today you received the following chemotherapy agents Velcade To help prevent nausea and vomiting after your treatment, we encourage you to take your nausea medication as prescribed.   If you develop nausea and vomiting that is not controlled by your nausea medication, call the clinic.   BELOW ARE SYMPTOMS THAT SHOULD BE REPORTED IMMEDIATELY:  *FEVER GREATER THAN 100.5 F  *CHILLS WITH OR WITHOUT FEVER  NAUSEA AND VOMITING THAT IS NOT CONTROLLED WITH YOUR NAUSEA MEDICATION  *UNUSUAL SHORTNESS OF BREATH  *UNUSUAL BRUISING OR BLEEDING  TENDERNESS IN MOUTH AND THROAT WITH OR WITHOUT PRESENCE OF ULCERS  *URINARY PROBLEMS  *BOWEL PROBLEMS  UNUSUAL RASH Items with * indicate a potential emergency and should be followed up as soon as possible.  Feel free to call the clinic should you have any questions or concerns. The clinic phone number is [phone].  Please show the CHEMO ALERT CARD at check-in to the Emergency Department and triage nurse.   

## 2017-03-07 ENCOUNTER — Telehealth: Payer: Self-pay | Admitting: Hematology and Oncology

## 2017-03-07 NOTE — Telephone Encounter (Signed)
Scheduled appt per 10/12 los - left message for patient and sent reminder letter in the mail.

## 2017-03-10 ENCOUNTER — Encounter: Payer: Self-pay | Admitting: Hematology and Oncology

## 2017-03-11 ENCOUNTER — Other Ambulatory Visit: Payer: Medicare Other

## 2017-03-11 ENCOUNTER — Ambulatory Visit: Payer: Medicare Other

## 2017-03-13 ENCOUNTER — Encounter: Payer: Self-pay | Admitting: Hematology and Oncology

## 2017-03-18 ENCOUNTER — Other Ambulatory Visit (HOSPITAL_BASED_OUTPATIENT_CLINIC_OR_DEPARTMENT_OTHER): Payer: Medicare Other

## 2017-03-18 ENCOUNTER — Ambulatory Visit (HOSPITAL_BASED_OUTPATIENT_CLINIC_OR_DEPARTMENT_OTHER): Payer: Medicare Other

## 2017-03-18 VITALS — BP 113/83 | HR 67 | Temp 98.3°F | Resp 18

## 2017-03-18 DIAGNOSIS — C9000 Multiple myeloma not having achieved remission: Secondary | ICD-10-CM

## 2017-03-18 DIAGNOSIS — Z5112 Encounter for antineoplastic immunotherapy: Secondary | ICD-10-CM

## 2017-03-18 LAB — COMPREHENSIVE METABOLIC PANEL
ALT: 19 U/L (ref 0–55)
AST: 21 U/L (ref 5–34)
Albumin: 3.8 g/dL (ref 3.5–5.0)
Alkaline Phosphatase: 90 U/L (ref 40–150)
Anion Gap: 8 mEq/L (ref 3–11)
BUN: 22.6 mg/dL (ref 7.0–26.0)
CO2: 27 mEq/L (ref 22–29)
Calcium: 9.7 mg/dL (ref 8.4–10.4)
Chloride: 103 mEq/L (ref 98–109)
Creatinine: 1.1 mg/dL (ref 0.6–1.1)
EGFR: >60 mL/min/{1.73_m2} (ref >60)
Glucose: 118 mg/dl (ref 70–140)
Potassium: 4 mEq/L (ref 3.5–5.1)
Sodium: 138 mEq/L (ref 136–145)
Total Bilirubin: 0.48 mg/dL (ref 0.20–1.20)
Total Protein: 7.5 g/dL (ref 6.4–8.3)

## 2017-03-18 LAB — CBC WITH DIFFERENTIAL/PLATELET
BASO%: 0.2 % (ref 0.0–2.0)
Basophils Absolute: 0 10*3/uL (ref 0.0–0.1)
EOS%: 0 % (ref 0.0–7.0)
Eosinophils Absolute: 0 10*3/uL (ref 0.0–0.5)
HCT: 36.8 % (ref 34.8–46.6)
HGB: 12 g/dL (ref 11.6–15.9)
LYMPH%: 11.2 % — ABNORMAL LOW (ref 14.0–49.7)
MCH: 28.5 pg (ref 25.1–34.0)
MCHC: 32.7 g/dL (ref 31.5–36.0)
MCV: 87 fL (ref 79.5–101.0)
MONO#: 0 10*3/uL — ABNORMAL LOW (ref 0.1–0.9)
MONO%: 1 % (ref 0.0–14.0)
NEUT#: 3.5 10*3/uL (ref 1.5–6.5)
NEUT%: 87.6 % — ABNORMAL HIGH (ref 38.4–76.8)
Platelets: 333 10*3/uL (ref 145–400)
RBC: 4.23 10*6/uL (ref 3.70–5.45)
RDW: 15.3 % — ABNORMAL HIGH (ref 11.2–14.5)
WBC: 4 10*3/uL (ref 3.9–10.3)
lymph#: 0.5 10*3/uL — ABNORMAL LOW (ref 0.9–3.3)

## 2017-03-18 MED ORDER — PROCHLORPERAZINE MALEATE 10 MG PO TABS
ORAL_TABLET | ORAL | Status: AC
  Filled 2017-03-18: qty 1

## 2017-03-18 MED ORDER — PROCHLORPERAZINE MALEATE 10 MG PO TABS
10.0000 mg | ORAL_TABLET | Freq: Once | ORAL | Status: AC
  Administered 2017-03-18: 10 mg via ORAL

## 2017-03-18 MED ORDER — BORTEZOMIB CHEMO SQ INJECTION 3.5 MG (2.5MG/ML)
1.0400 mg/m2 | Freq: Once | INTRAMUSCULAR | Status: AC
  Administered 2017-03-18: 2.5 mg via SUBCUTANEOUS
  Filled 2017-03-18: qty 2.5

## 2017-03-18 NOTE — Patient Instructions (Signed)
[facility] Discharge Instructions for Patients Receiving Chemotherapy  Today you received the following chemotherapy agents Velcade.  To help prevent nausea and vomiting after your treatment, we encourage you to take your nausea medication as directed.  If you develop nausea and vomiting that is not controlled by your nausea medication, call the clinic.   BELOW ARE SYMPTOMS THAT SHOULD BE REPORTED IMMEDIATELY:  *FEVER GREATER THAN 100.5 F  *CHILLS WITH OR WITHOUT FEVER  NAUSEA AND VOMITING THAT IS NOT CONTROLLED WITH YOUR NAUSEA MEDICATION  *UNUSUAL SHORTNESS OF BREATH  *UNUSUAL BRUISING OR BLEEDING  TENDERNESS IN MOUTH AND THROAT WITH OR WITHOUT PRESENCE OF ULCERS  *URINARY PROBLEMS  *BOWEL PROBLEMS  UNUSUAL RASH Items with * indicate a potential emergency and should be followed up as soon as possible.  Feel free to call the clinic should you have any questions or concerns. The clinic phone number is [phone].  Please show the CHEMO ALERT CARD at check-in to the Emergency Department and triage nurse.   

## 2017-03-21 LAB — KAPPA/LAMBDA LIGHT CHAINS
Ig Kappa Free Light Chain: 12.9 mg/L (ref 3.3–19.4)
Ig Lambda Free Light Chain: 16.7 mg/L (ref 5.7–26.3)
Kappa/Lambda FluidC Ratio: 0.77 (ref 0.26–1.65)

## 2017-03-22 LAB — MULTIPLE MYELOMA PANEL, SERUM
Albumin SerPl Elph-Mcnc: 3.7 g/dL (ref 2.9–4.4)
Albumin/Glob SerPl: 1.2 (ref 0.7–1.7)
Alpha 1: 0.2 g/dL (ref 0.0–0.4)
Alpha2 Glob SerPl Elph-Mcnc: 0.9 g/dL (ref 0.4–1.0)
B-Globulin SerPl Elph-Mcnc: 1.2 g/dL (ref 0.7–1.3)
Gamma Glob SerPl Elph-Mcnc: 0.8 g/dL (ref 0.4–1.8)
Globulin, Total: 3.1 g/dL (ref 2.2–3.9)
IgA, Qn, Serum: 39 mg/dL — ABNORMAL LOW (ref 87–352)
IgG, Qn, Serum: 938 mg/dL (ref 700–1600)
IgM, Qn, Serum: 36 mg/dL (ref 26–217)
M Protein SerPl Elph-Mcnc: 0.2 g/dL — ABNORMAL HIGH
Total Protein: 6.8 g/dL (ref 6.0–8.5)

## 2017-03-23 ENCOUNTER — Telehealth: Payer: Self-pay

## 2017-03-23 NOTE — Telephone Encounter (Signed)
Patient called and left message to call her, she has developed a cough.  Called back. She has developed a intermittent cough. Spitting up yellow phlegm now, the phlegm was white earlier today. No fever. Started out as a sinus drainage and has now settled into her chest. She has not taken anything for the cough, she is going to start cough drops and robitussin as needed. [place] at the beach now and coming home tomorrow.  Instructed to call back tomorrow for worsening symptoms.

## 2017-03-25 ENCOUNTER — Encounter: Payer: Self-pay | Admitting: Hematology and Oncology

## 2017-03-25 ENCOUNTER — Other Ambulatory Visit (HOSPITAL_BASED_OUTPATIENT_CLINIC_OR_DEPARTMENT_OTHER): Payer: Medicare Other

## 2017-03-25 ENCOUNTER — Telehealth: Payer: Self-pay | Admitting: Hematology and Oncology

## 2017-03-25 ENCOUNTER — Ambulatory Visit (HOSPITAL_BASED_OUTPATIENT_CLINIC_OR_DEPARTMENT_OTHER): Payer: Medicare Other

## 2017-03-25 ENCOUNTER — Ambulatory Visit: Payer: Medicare Other | Admitting: Hematology and Oncology

## 2017-03-25 VITALS — BP 118/76 | HR 87 | Temp 97.8°F | Resp 18 | Ht 69.0 in | Wt 258.1 lb

## 2017-03-25 DIAGNOSIS — C9000 Multiple myeloma not having achieved remission: Secondary | ICD-10-CM | POA: Diagnosis not present

## 2017-03-25 DIAGNOSIS — T50905A Adverse effect of unspecified drugs, medicaments and biological substances, initial encounter: Secondary | ICD-10-CM | POA: Insufficient documentation

## 2017-03-25 DIAGNOSIS — Z5112 Encounter for antineoplastic immunotherapy: Secondary | ICD-10-CM | POA: Diagnosis not present

## 2017-03-25 DIAGNOSIS — R0981 Nasal congestion: Secondary | ICD-10-CM | POA: Diagnosis not present

## 2017-03-25 DIAGNOSIS — G620 Drug-induced polyneuropathy: Secondary | ICD-10-CM

## 2017-03-25 DIAGNOSIS — R739 Hyperglycemia, unspecified: Secondary | ICD-10-CM | POA: Insufficient documentation

## 2017-03-25 DIAGNOSIS — T451X5A Adverse effect of antineoplastic and immunosuppressive drugs, initial encounter: Secondary | ICD-10-CM

## 2017-03-25 LAB — CBC WITH DIFFERENTIAL/PLATELET
BASO%: 0.4 % (ref 0.0–2.0)
Basophils Absolute: 0 10*3/uL (ref 0.0–0.1)
EOS%: 0.4 % (ref 0.0–7.0)
Eosinophils Absolute: 0 10*3/uL (ref 0.0–0.5)
HCT: 39.1 % (ref 34.8–46.6)
HGB: 12.8 g/dL (ref 11.6–15.9)
LYMPH%: 15.7 % (ref 14.0–49.7)
MCH: 28.6 pg (ref 25.1–34.0)
MCHC: 32.7 g/dL (ref 31.5–36.0)
MCV: 87.5 fL (ref 79.5–101.0)
MONO#: 0 10*3/uL — ABNORMAL LOW (ref 0.1–0.9)
MONO%: 0.6 % (ref 0.0–14.0)
NEUT#: 4.2 10*3/uL (ref 1.5–6.5)
NEUT%: 82.9 % — ABNORMAL HIGH (ref 38.4–76.8)
Platelets: 200 10*3/uL (ref 145–400)
RBC: 4.47 10*6/uL (ref 3.70–5.45)
RDW: 15.7 % — ABNORMAL HIGH (ref 11.2–14.5)
WBC: 5.1 10*3/uL (ref 3.9–10.3)
lymph#: 0.8 10*3/uL — ABNORMAL LOW (ref 0.9–3.3)

## 2017-03-25 LAB — COMPREHENSIVE METABOLIC PANEL
ALT: 32 U/L (ref 0–55)
AST: 39 U/L — ABNORMAL HIGH (ref 5–34)
Albumin: 3.5 g/dL (ref 3.5–5.0)
Alkaline Phosphatase: 85 U/L (ref 40–150)
Anion Gap: 11 mEq/L (ref 3–11)
BUN: 15.6 mg/dL (ref 7.0–26.0)
CO2: 27 mEq/L (ref 22–29)
Calcium: 9.6 mg/dL (ref 8.4–10.4)
Chloride: 101 mEq/L (ref 98–109)
Creatinine: 1.1 mg/dL (ref 0.6–1.1)
EGFR: 58 mL/min/{1.73_m2} — ABNORMAL LOW (ref >60)
Glucose: 269 mg/dl — ABNORMAL HIGH (ref 70–140)
Potassium: 3.9 mEq/L (ref 3.5–5.1)
Sodium: 139 mEq/L (ref 136–145)
Total Bilirubin: 0.76 mg/dL (ref 0.20–1.20)
Total Protein: 7.4 g/dL (ref 6.4–8.3)

## 2017-03-25 MED ORDER — BORTEZOMIB CHEMO SQ INJECTION 3.5 MG (2.5MG/ML)
1.0400 mg/m2 | Freq: Once | INTRAMUSCULAR | Status: AC
  Administered 2017-03-25: 2.5 mg via SUBCUTANEOUS
  Filled 2017-03-25: qty 2.5

## 2017-03-25 MED ORDER — PROCHLORPERAZINE MALEATE 10 MG PO TABS
ORAL_TABLET | ORAL | Status: AC
  Filled 2017-03-25: qty 1

## 2017-03-25 MED ORDER — PROCHLORPERAZINE MALEATE 10 MG PO TABS
10.0000 mg | ORAL_TABLET | Freq: Once | ORAL | Status: AC
  Administered 2017-03-25: 10 mg via ORAL

## 2017-03-25 MED ORDER — GABAPENTIN 300 MG PO CAPS: 900.0000 mg | ORAL_CAPSULE | Freq: Two times a day (BID) | ORAL | 3 refills | Status: DC

## 2017-03-25 NOTE — Progress Notes (Signed)
[place] OFFICE PROGRESS NOTE  Patient Care Team: Lucianne Lei, MD as PCP - General (Family Medicine)  SUMMARY OF ONCOLOGIC HISTORY:   Multiple myeloma with failed remission ([place])   03/22/2016 Bone Marrow Biopsy    Bone Marrow Biopsy: Plasma cells 17% by Aspirate and 30% by CD 138 stain. Plasma cell neoplasm, Kappa Restricted Normal cytogenetics, FISH positive for +11 and +14 and +7      04/13/2016 Imaging    Skeletal survey showed lucencies within the calvarium worrisome for myeloma. No definite abnormal lytic or blastic lesions are observed elsewhere.      04/19/2016 - 09/10/2016 Chemotherapy    The patient consented to treatment with Revlimid, dexamethasone and Velcade      09/14/2016 Bone Marrow Biopsy    In summary, there is a normal cellular marrow with trilineage hematopoiesis.A mild plasmacytosis is present, but there is no definitive evidence of residual multiple myeloma      10/12/2016 - 10/12/2016 Chemotherapy    She received melphalan as conditioning chemo      10/13/2016 Bone Marrow Transplant    She received autologous stem cell transplant at [facility]      01/24/2017 Bone Marrow Biopsy    BONE MARROW: -Normocellular marrow for age (50%) with trilineage hematopoiesis. -No morphologic or immunohistochemical evidence of involvement by plasma cell neoplasm (see comment)  PERIPHERAL BLOOD: -Unremarkable (see CBC data)  COMMENT: The bone marrow is normocellular for age and shows adequate trilineage hematopoiesis without significant (<10%) dysplastic changes present. Blasts are not increased. Plasma cells represent about 2% of total cells on aspirate smears. Appropriately controlled immunohistochemical stains are performed on the core biopsy. CD138 highlights small interstitial plasma cells comprising approximately 2% of total cells. These plasma cells appear polytypic as demonstrated by kappa and lambda in-situ  hybridization.      01/25/2017 PET scan    No FDG avid osseous lesions or masses are identified.      02/18/2017 -  Chemotherapy    She received weekly Velcade, Pomalyst and Dexamethasone       INTERVAL HISTORY: Please see below for problem oriented charting. She returns for further follow-up She had mild sinus congestion cough Denies fever or chills or sore throat She felt that neuropathy is worse She denies new bone pain She complain of fatigue No recent skin rashes  REVIEW OF SYSTEMS:   Constitutional: Denies fevers, chills or abnormal weight loss Eyes: Denies blurriness of vision Ears, nose, mouth, throat, and face: Denies mucositis or sore throat Cardiovascular: Denies palpitation, chest discomfort or lower extremity swelling Gastrointestinal:  Denies nausea, heartburn or change in bowel habits Skin: Denies abnormal skin rashes Lymphatics: Denies new lymphadenopathy or easy bruising Neurological:Denies numbness, tingling or new weaknesses Behavioral/Psych: Mood is stable, no new changes  All other systems were reviewed with the patient and are negative.  I have reviewed the past medical history, past surgical history, social history and family history with the patient and they are unchanged from previous note.  ALLERGIES:  is allergic to revlimid [lenalidomide].  MEDICATIONS:  Current Outpatient Medications  Medication Sig Dispense Refill  . Calcium Carbonate-Vitamin D (CALCIUM PLUS VITAMIN D PO) Take 1 tablet daily by mouth.    . sulfamethoxazole-trimethoprim (BACTRIM DS,SEPTRA DS) 800-160 MG tablet Take 1 tablet 3 (three) times a week by mouth.    Marland Kitchen acyclovir (ZOVIRAX) 400 MG tablet Take 1 tablet (400 mg total) by mouth 2 (two) times daily. 90 tablet 9  . ASPIRIN 81 PO  Take 81 mg by mouth daily.    . bisacodyl (BISACODYL) 5 MG EC tablet Take 5 mg by mouth daily as needed for moderate constipation.    Marland Kitchen dexamethasone (DECADRON) 4 MG tablet Take 40 mg once a week in  the morning with food 60 tablet 0  . gabapentin (NEURONTIN) 300 MG capsule Take 3 capsules (900 mg total) 2 (two) times daily by mouth. Take 1 in the morning, 1 in the afternoon and 2 at night 90 capsule 3  . losartan-hydrochlorothiazide (HYZAAR) 100-25 MG tablet Take 1 tablet by mouth daily. (Patient taking differently: Take 1 tablet by mouth daily. TAKES 1/2 TABLET DAILY) 90 tablet 3  . Multiple Vitamin (MULTIVITAMIN) tablet Take 1 tablet by mouth daily.    . ondansetron (ZOFRAN) 8 MG tablet Take 1 tablet (8 mg total) by mouth 2 (two) times daily as needed (Nausea or vomiting). (Patient not taking: Reported on 08/24/2016) 30 tablet 1  . POMALYST 4 MG capsule TAKE 1 CAPSULE (4MG) BY MOUTH ONCE DAILY FOR 21 DAYS ON AND 7 DAYS OFF 21 capsule 10  . prochlorperazine (COMPAZINE) 10 MG tablet Take 1 tablet (10 mg total) by mouth every 6 (six) hours as needed (Nausea or vomiting). (Patient not taking: Reported on 06/22/2016) 30 tablet 1  . Sodium Fluoride 0.15 % PSTE Use as directed in the mouth or throat.    . traMADol (ULTRAM) 50 MG tablet Take 1 tablet (50 mg total) by mouth every 6 (six) hours as needed. for pain 90 tablet 0  . triamcinolone (NASACORT) 55 MCG/ACT AERO nasal inhaler Place 2 sprays into the nose daily. (Patient taking differently: Place 2 sprays into the nose daily. Uses prn) 1 Inhaler 12   No current facility-administered medications for this visit.     PHYSICAL EXAMINATION: ECOG PERFORMANCE STATUS: 1 - Symptomatic but completely ambulatory  Vitals:   03/25/17 1429  BP: 118/76  Pulse: 87  Resp: 18  Temp: 97.8 F (36.6 C)  SpO2: 100%   Filed Weights   03/25/17 1429  Weight: 258 lb 1.6 oz (117.1 kg)    GENERAL:alert, no distress and comfortable SKIN: skin color, texture, turgor are normal, no rashes or significant lesions EYES: normal, Conjunctiva are pink and non-injected, sclera clear OROPHARYNX:no exudate, no erythema and lips, buccal mucosa, and tongue normal  NECK:  supple, thyroid normal size, non-tender, without nodularity LYMPH:  no palpable lymphadenopathy in the cervical, axillary or inguinal LUNGS: clear to auscultation and percussion with normal breathing effort HEART: regular rate & rhythm and no murmurs and no lower extremity edema ABDOMEN:abdomen soft, non-tender and normal bowel sounds Musculoskeletal:no cyanosis of digits and no clubbing  NEURO: alert & oriented x 3 with fluent speech, no focal motor/sensory deficits  LABORATORY DATA:  I have reviewed the data as listed    Component Value Date/Time   NA 139 03/25/2017 1407   K 3.9 03/25/2017 1407   CO2 27 03/25/2017 1407   GLUCOSE 269 (H) 03/25/2017 1407   BUN 15.6 03/25/2017 1407   CREATININE 1.1 03/25/2017 1407   CALCIUM 9.6 03/25/2017 1407   PROT 7.4 03/25/2017 1407   ALBUMIN 3.5 03/25/2017 1407   AST 39 (H) 03/25/2017 1407   ALT 32 03/25/2017 1407   ALKPHOS 85 03/25/2017 1407   BILITOT 0.76 03/25/2017 1407    No results found for: SPEP, UPEP  Lab Results  Component Value Date   WBC 5.1 03/25/2017   NEUTROABS 4.2 03/25/2017   HGB 12.8 03/25/2017  HCT 39.1 03/25/2017   MCV 87.5 03/25/2017   PLT 200 03/25/2017      Chemistry      Component Value Date/Time   NA 139 03/25/2017 1407   K 3.9 03/25/2017 1407   CO2 27 03/25/2017 1407   BUN 15.6 03/25/2017 1407   CREATININE 1.1 03/25/2017 1407      Component Value Date/Time   CALCIUM 9.6 03/25/2017 1407   ALKPHOS 85 03/25/2017 1407   AST 39 (H) 03/25/2017 1407   ALT 32 03/25/2017 1407   BILITOT 0.76 03/25/2017 1407       ASSESSMENT & PLAN:  Multiple myeloma with failed remission ([place]) She tolerated treatment well She has mild peripheral neuropathy. She will continue reduced dose Velcade weekly She will continue calcium with vitamin D supplement and Zometa every 3 months She will take dexamethasone weekly and Pomalyst on days 1-21, rest 7 days She will take acyclovir for antimicrobial prophylaxis and aspirin  for DVT prophylaxis Her recent myeloma panel show continued improvement of disease control with VGPR The patient is concerned about ongoing plan for treatment She has appointment to see her transplant physician at the end of the month and I will see her back afterwards to discuss plans of care  Peripheral neuropathy due to chemotherapy [facility]) She felt that her peripheral neuropathy is worse, likely exacerbated by ongoing chemotherapy and the cold weather I recommend increasing gabapentin to 900 mg twice a day  Hyperglycemia, drug-induced She has drug induced hyperglycemia I recommend dietary modification and we will monitor carefully If it remains elevated, I will have to reduce the dose of dexamethasone  Sinus congestion She has signs and symptoms of sinus congestion do not believe she had bacterial infection I recommend over-the-counter remedies   No orders of the defined types were placed in this encounter.  All questions were answered. The patient knows to call the clinic with any problems, questions or concerns. No barriers to learning was detected. I spent 15 minutes counseling the patient face to face. The total time spent in the appointment was 20 minutes and more than 50% was on counseling and review of test results     Heath Lark, MD 03/25/2017 5:00 PM

## 2017-03-25 NOTE — Telephone Encounter (Signed)
Scheduled appt per 11/9 los - Gave patient AVS and calender per los.  

## 2017-03-25 NOTE — Assessment & Plan Note (Signed)
She tolerated treatment well She has mild peripheral neuropathy. She will continue reduced dose Velcade weekly She will continue calcium with vitamin D supplement and Zometa every 3 months She will take dexamethasone weekly and Pomalyst on days 1-21, rest 7 days She will take acyclovir for antimicrobial prophylaxis and aspirin for DVT prophylaxis Her recent myeloma panel show continued improvement of disease control with VGPR The patient is concerned about ongoing plan for treatment She has appointment to see her transplant physician at the end of the month and I will see her back afterwards to discuss plans of care

## 2017-03-25 NOTE — Assessment & Plan Note (Signed)
She felt that her peripheral neuropathy is worse, likely exacerbated by ongoing chemotherapy and the cold weather I recommend increasing gabapentin to 900 mg twice a day

## 2017-03-25 NOTE — Assessment & Plan Note (Signed)
She has signs and symptoms of sinus congestion do not believe she had bacterial infection I recommend over-the-counter remedies

## 2017-03-25 NOTE — Assessment & Plan Note (Signed)
She has drug induced hyperglycemia I recommend dietary modification and we will monitor carefully If it remains elevated, I will have to reduce the dose of dexamethasone

## 2017-03-25 NOTE — Patient Instructions (Signed)
[place] Discharge Instructions for Patients Receiving Chemotherapy  Today you received the following chemotherapy agents Velcade To help prevent nausea and vomiting after your treatment, we encourage you to take your nausea medication as directed   prochlorperazine (COMPAZINE) 10 MG tablet 30 tablet 1 04/19/2016    Sig - Route: Take 1 tablet (10 mg total) by mouth every 6 (six) hours as needed (Nausea or vomiting). - Oral      If you develop nausea and vomiting that is not controlled by your nausea medication, call the clinic.   BELOW ARE SYMPTOMS THAT SHOULD BE REPORTED IMMEDIATELY:  *FEVER GREATER THAN 100.5 F  *CHILLS WITH OR WITHOUT FEVER  NAUSEA AND VOMITING THAT IS NOT CONTROLLED WITH YOUR NAUSEA MEDICATION  *UNUSUAL SHORTNESS OF BREATH  *UNUSUAL BRUISING OR BLEEDING  TENDERNESS IN MOUTH AND THROAT WITH OR WITHOUT PRESENCE OF ULCERS  *URINARY PROBLEMS  *BOWEL PROBLEMS  UNUSUAL RASH Items with * indicate a potential emergency and should be followed up as soon as possible.  Feel free to call the clinic should you have any questions or concerns. The clinic phone number is [phone].  Please show the [place] at check-in to the Emergency Department and triage nurse.

## 2017-03-29 ENCOUNTER — Other Ambulatory Visit: Payer: Self-pay

## 2017-03-29 ENCOUNTER — Emergency Department (HOSPITAL_COMMUNITY)
Admission: EM | Admit: 2017-03-29 | Discharge: 2017-03-29 | Disposition: A | Discharge status: Home / Self Care | Payer: Medicare Other | Attending: Emergency Medicine | Admitting: Emergency Medicine

## 2017-03-29 DIAGNOSIS — R55 Syncope and collapse: Secondary | ICD-10-CM | POA: Diagnosis not present

## 2017-03-29 DIAGNOSIS — Z79899 Other long term (current) drug therapy: Secondary | ICD-10-CM | POA: Diagnosis not present

## 2017-03-29 DIAGNOSIS — I10 Essential (primary) hypertension: Secondary | ICD-10-CM | POA: Diagnosis not present

## 2017-03-29 DIAGNOSIS — Z8579 Personal history of other malignant neoplasms of lymphoid, hematopoietic and related tissues: Secondary | ICD-10-CM | POA: Diagnosis not present

## 2017-03-29 DIAGNOSIS — Z7982 Long term (current) use of aspirin: Secondary | ICD-10-CM | POA: Diagnosis not present

## 2017-03-29 LAB — CBC WITH DIFFERENTIAL/PLATELET
Basophils Absolute: 0 10*3/uL (ref 0.0–0.1)
Basophils Relative: 0 %
Eosinophils Absolute: 0.3 10*3/uL (ref 0.0–0.7)
Eosinophils Relative: 3 %
HCT: 35.5 % — ABNORMAL LOW (ref 36.0–46.0)
Hemoglobin: 11.7 g/dL — ABNORMAL LOW (ref 12.0–15.0)
Lymphocytes Relative: 10 %
Lymphs Abs: 1 10*3/uL (ref 0.7–4.0)
MCH: 28.1 pg (ref 26.0–34.0)
MCHC: 33 g/dL (ref 30.0–36.0)
MCV: 85.3 fL (ref 78.0–100.0)
Monocytes Absolute: 0.8 10*3/uL (ref 0.1–1.0)
Monocytes Relative: 8 %
Neutro Abs: 7.8 10*3/uL — ABNORMAL HIGH (ref 1.7–7.7)
Neutrophils Relative %: 79 %
Platelets: 192 10*3/uL (ref 150–400)
RBC: 4.16 MIL/uL (ref 3.87–5.11)
RDW: 15.8 % — ABNORMAL HIGH (ref 11.5–15.5)
WBC: 9.9 10*3/uL (ref 4.0–10.5)

## 2017-03-29 LAB — BASIC METABOLIC PANEL
Anion gap: 8 (ref 5–15)
BUN: 25 mg/dL — ABNORMAL HIGH (ref 6–20)
CO2: 28 mmol/L (ref 22–32)
Calcium: 9.7 mg/dL (ref 8.9–10.3)
Chloride: 103 mmol/L (ref 101–111)
Creatinine, Ser: 1.03 mg/dL — ABNORMAL HIGH (ref 0.44–1.00)
GFR calc Af Amer: >60 mL/min (ref >60)
GFR calc non Af Amer: 56 mL/min — ABNORMAL LOW (ref >60)
Glucose, Bld: 100 mg/dL — ABNORMAL HIGH (ref 65–99)
Potassium: 4.1 mmol/L (ref 3.5–5.1)
Sodium: 139 mmol/L (ref 135–145)

## 2017-03-29 MED ORDER — SODIUM CHLORIDE 0.9 % IV BOLUS (SEPSIS)
1000.0000 mL | Freq: Once | INTRAVENOUS | Status: AC
  Administered 2017-03-29: 1000 mL via INTRAVENOUS

## 2017-03-29 NOTE — ED Triage Notes (Signed)
Pt from excerise class became hot and dizzy had syncopal episode and was easied to the floor by instructor Clammy on arrival of EMS denies CP or SOB and dizziness resolved no N/V. Hx CA multiple myleoma site gets Chemo Q Friday for the last year with last treatment Friday past

## 2017-03-29 NOTE — ED Provider Notes (Signed)
[place] [facility] DEPT Provider Note   CSN: [id number] Arrival date & time: 03/29/17  1954     History   Chief Complaint Chief Complaint  Patient presents with  . Near Syncope    HPI Carolyn Newman is a 66 y.o. female.  HPI  65 year old female with a history of multiple myeloma currently receiving Bortezomib presents with near syncope.  The patient states that she was in an exercise class that was very warm as soon as she got in there.  She dressed in layers because she thought it would be cold tonight but the instructor brought a space heater.  Multiple other people thought it was warm and there.  She felt hotter and hotter throughout the exercise class and at the end she felt hot and was sweaty and clammy.  She apparently fell forward and her instructor caught her before she fell to the ground.  Patient does not remember falling but also states that she was told she did not pass out as her eyes were open the whole time.  She never had chest pain, headache, weakness, numbness, shortness of breath.  When she got outside when EMS picked her up she instantly felt better in the cool air.  Feels asymptomatic at this time.  Past Medical History:  Diagnosis Date  . Anemia   . Cancer ([place])    myeloma  . Hypertension     Patient Active Problem List   Diagnosis Date Noted  . Hyperglycemia, drug-induced 03/25/2017  . Sinus congestion 03/25/2017  . S/P autologous bone marrow transplantation ([place]) 02/01/2017  . Hypokalemia 08/24/2016  . Peripheral neuropathy due to chemotherapy ([place]) 07/13/2016  . Drug-induced skin rash 07/13/2016  . Physical debility 07/13/2016  . Allergic rhinitis 07/02/2016  . Rash due to allergy 07/02/2016  . Anemia due to antineoplastic chemotherapy 06/23/2016  . Elevated serum creatinine 05/22/2016  . Financial difficulty 05/22/2016  . Pancytopenia, acquired ([place]) 04/19/2016  . Multiple myeloma not having achieved remission ([place])  04/05/2016  . Multiple myeloma with failed remission ([place]) 04/04/2016  . Normocytic anemia 03/09/2016    Past Surgical History:  Procedure Laterality Date  . EYE SURGERY      OB History    No data available       Home Medications    Prior to Admission medications   Medication Sig Start Date End Date Taking? Authorizing Provider  acyclovir (ZOVIRAX) 400 MG tablet Take 1 tablet (400 mg total) by mouth 2 (two) times daily. 02/25/17  Yes Gorsuch, Ni, MD  ASPIRIN 81 PO Take 81 mg by mouth daily.   Yes [provider]  Calcium Carbonate-Vitamin D (CALCIUM PLUS VITAMIN D PO) Take 1 tablet daily by mouth. 02/16/17  Yes [provider]  gabapentin (NEURONTIN) 300 MG capsule Take 3 capsules (900 mg total) 2 (two) times daily by mouth. Take 1 in the morning, 1 in the afternoon and 2 at night Patient taking differently: Take 900 mg 2 (two) times daily by mouth.  03/25/17  Yes Gorsuch, Ni, MD  losartan-hydrochlorothiazide (HYZAAR) 100-25 MG tablet Take 1 tablet by mouth daily. Patient taking differently: Take 1 tablet by mouth daily. TAKES 1/2 TABLET DAILY 04/26/16  Yes Gorsuch, Ernst Spell, MD  Multiple Vitamin (MULTIVITAMIN) tablet Take 1 tablet by mouth daily.   Yes [provider]  POMALYST 4 MG capsule TAKE 1 CAPSULE (4MG) BY MOUTH ONCE DAILY FOR 21 DAYS ON AND 7 DAYS OFF 02/28/17  Yes Heath Lark, MD  sulfamethoxazole-trimethoprim (  BACTRIM DS,SEPTRA DS) 800-160 MG tablet Take 1 tablet 3 (three) times a week by mouth. 11/12/16  Yes [provider]  traMADol (ULTRAM) 50 MG tablet Take 1 tablet (50 mg total) by mouth every 6 (six) hours as needed. for pain 02/09/17  Yes Gorsuch, Ni, MD  bisacodyl (BISACODYL) 5 MG EC tablet Take 5 mg by mouth daily as needed for moderate constipation.    [provider]  dexamethasone (DECADRON) 4 MG tablet Take 40 mg once a week in the morning with food 02/01/17   Heath Lark, MD  ondansetron (ZOFRAN) 8 MG tablet Take 1 tablet (8  mg total) by mouth 2 (two) times daily as needed (Nausea or vomiting). Patient not taking: Reported on 08/24/2016 04/19/16   Heath Lark, MD  prochlorperazine (COMPAZINE) 10 MG tablet Take 1 tablet (10 mg total) by mouth every 6 (six) hours as needed (Nausea or vomiting). Patient not taking: Reported on 06/22/2016 04/19/16   Heath Lark, MD  Sodium Fluoride 0.15 % PSTE Use as directed in the mouth or throat.    [provider]  triamcinolone (NASACORT) 55 MCG/ACT AERO nasal inhaler Place 2 sprays into the nose daily. Patient taking differently: Place 2 sprays daily as needed into the nose (allergies). Uses prn 07/02/16   Heath Lark, MD    Family History Family History  Problem Relation Age of Onset  . Cancer Mother        lymphoma  . Cancer Father        prostate ca    Social History Social History   Tobacco Use  . Smoking status: Never Smoker  . Smokeless tobacco: Never Used  Substance Use Topics  . Alcohol use: No  . Drug use: No     Allergies   Revlimid [lenalidomide]   Review of Systems Review of Systems  Constitutional: Negative for fever.  Respiratory: Negative for shortness of breath.   Cardiovascular: Negative for chest pain and palpitations.  Gastrointestinal: Negative for abdominal pain, nausea and vomiting.  Neurological: Positive for syncope. Negative for weakness, numbness and headaches.  All other systems reviewed and are negative.    Physical Exam Updated Vital Signs BP 114/60 (BP Location: Right Arm)   Pulse (!) 57   Temp 98.1 F (36.7 C) (Oral)   Resp 18   Ht 5' 9" (1.753 m)   Wt 116.6 kg (257 lb)   SpO2 100%   BMI 37.95 kg/m   Physical Exam  Constitutional: She is oriented to person, place, and time. She appears well-developed and well-nourished. No distress.  HENT:  Head: Normocephalic and atraumatic.  Right Ear: External ear normal.  Left Ear: External ear normal.  Nose: Nose normal.  Eyes: EOM are normal. Pupils are equal,  round, and reactive to light. Right eye exhibits no discharge. Left eye exhibits no discharge.  Neck: Neck supple.  Cardiovascular: Normal rate, regular rhythm and normal heart sounds.  No murmur heard. Pulmonary/Chest: Effort normal and breath sounds normal.  Abdominal: Soft. She exhibits no distension. There is no tenderness.  Neurological: She is alert and oriented to person, place, and time.  CN 3-12 grossly intact. 5/5 strength in all 4 extremities. Grossly normal sensation. Normal finger to nose.   Skin: Skin is warm and dry. She is not diaphoretic.  Nursing note and vitals reviewed.    ED Treatments / Results  Labs (all labs ordered are listed, but only abnormal results are displayed) Labs Reviewed  CBC WITH DIFFERENTIAL/PLATELET - Abnormal; Notable  for the following components:      Result Value   Hemoglobin 11.7 (*)    HCT 35.5 (*)    RDW 15.8 (*)    Neutro Abs 7.8 (*)    All other components within normal limits  BASIC METABOLIC PANEL - Abnormal; Notable for the following components:   Glucose, Bld 100 (*)    BUN 25 (*)    Creatinine, Ser 1.03 (*)    GFR calc non Af Amer 56 (*)    All other components within normal limits    EKG  EKG Interpretation  Date/Time:  Tuesday March 29 2017 20:37:38 EST Ventricular Rate:  67 PR Interval:    QRS Duration: 91 QT Interval:  409 QTC Calculation: 432 R Axis:   53 Text Interpretation:  Sinus rhythm Low voltage, precordial leads No old tracing to compare Confirmed by Sherwood Gambler [phone]) on 03/29/2017 8:49:24 PM       Radiology No results found.  Procedures Procedures (including critical care time)  Medications Ordered in ED Medications  sodium chloride 0.9 % bolus 1,000 mL (0 mLs Intravenous Stopped 03/29/17 2246)     Initial Impression / Assessment and Plan / ED Course  I have reviewed the triage vital signs and the nursing notes.  Pertinent labs & imaging results that were available during my care of  the patient were reviewed by me and considered in my medical decision making (see chart for details).     Patient presents after apparent syncope. Likely related to being too hot with space heater and too many clothes with exercise. Exam/workup benign. Feels normal. I doubt this is cardiac or neuro. Appears stable for discharge home, increase fluids, f/u with PCP. Return precautions.   Final Clinical Impressions(s) / ED Diagnoses   Final diagnoses:  Near syncope    ED Discharge Orders    None       Sherwood Gambler, MD 03/29/17 2354

## 2017-03-30 ENCOUNTER — Other Ambulatory Visit: Payer: Self-pay | Admitting: Hematology and Oncology

## 2017-03-30 NOTE — Telephone Encounter (Signed)
Please refill electronically 

## 2017-03-31 ENCOUNTER — Other Ambulatory Visit: Payer: Self-pay

## 2017-04-01 ENCOUNTER — Ambulatory Visit (HOSPITAL_BASED_OUTPATIENT_CLINIC_OR_DEPARTMENT_OTHER): Payer: Medicare Other

## 2017-04-01 ENCOUNTER — Other Ambulatory Visit (HOSPITAL_BASED_OUTPATIENT_CLINIC_OR_DEPARTMENT_OTHER): Payer: Medicare Other

## 2017-04-01 ENCOUNTER — Encounter: Payer: Self-pay | Admitting: Hematology and Oncology

## 2017-04-01 DIAGNOSIS — C9000 Multiple myeloma not having achieved remission: Secondary | ICD-10-CM

## 2017-04-01 DIAGNOSIS — Z5112 Encounter for antineoplastic immunotherapy: Secondary | ICD-10-CM

## 2017-04-01 LAB — CBC WITH DIFFERENTIAL/PLATELET
BASO%: 0.5 % (ref 0.0–2.0)
Basophils Absolute: 0 10*3/uL (ref 0.0–0.1)
EOS%: 0.5 % (ref 0.0–7.0)
Eosinophils Absolute: 0 10*3/uL (ref 0.0–0.5)
HCT: 37.8 % (ref 34.8–46.6)
HGB: 12.2 g/dL (ref 11.6–15.9)
LYMPH%: 17.1 % (ref 14.0–49.7)
MCH: 28 pg (ref 25.1–34.0)
MCHC: 32.2 g/dL (ref 31.5–36.0)
MCV: 86.9 fL (ref 79.5–101.0)
MONO#: 0 10*3/uL — ABNORMAL LOW (ref 0.1–0.9)
MONO%: 1.2 % (ref 0.0–14.0)
NEUT#: 3.2 10*3/uL (ref 1.5–6.5)
NEUT%: 80.7 % — ABNORMAL HIGH (ref 38.4–76.8)
Platelets: 229 10*3/uL (ref 145–400)
RBC: 4.35 10*6/uL (ref 3.70–5.45)
RDW: 16.2 % — ABNORMAL HIGH (ref 11.2–14.5)
WBC: 3.9 10*3/uL (ref 3.9–10.3)
lymph#: 0.7 10*3/uL — ABNORMAL LOW (ref 0.9–3.3)

## 2017-04-01 LAB — COMPREHENSIVE METABOLIC PANEL
ALT: 23 U/L (ref 0–55)
AST: 22 U/L (ref 5–34)
Albumin: 3.6 g/dL (ref 3.5–5.0)
Alkaline Phosphatase: 82 U/L (ref 40–150)
Anion Gap: 9 mEq/L (ref 3–11)
BUN: 18.1 mg/dL (ref 7.0–26.0)
CO2: 26 mEq/L (ref 22–29)
Calcium: 9.9 mg/dL (ref 8.4–10.4)
Chloride: 102 mEq/L (ref 98–109)
Creatinine: 1.1 mg/dL (ref 0.6–1.1)
EGFR: >60 mL/min/{1.73_m2} (ref >60)
Glucose: 141 mg/dl — ABNORMAL HIGH (ref 70–140)
Potassium: 4.4 mEq/L (ref 3.5–5.1)
Sodium: 138 mEq/L (ref 136–145)
Total Bilirubin: 0.41 mg/dL (ref 0.20–1.20)
Total Protein: 7.2 g/dL (ref 6.4–8.3)

## 2017-04-01 MED ORDER — PROCHLORPERAZINE MALEATE 10 MG PO TABS
ORAL_TABLET | ORAL | Status: AC
  Filled 2017-04-01: qty 1

## 2017-04-01 MED ORDER — PROCHLORPERAZINE MALEATE 10 MG PO TABS
10.0000 mg | ORAL_TABLET | Freq: Once | ORAL | Status: AC
  Administered 2017-04-01: 10 mg via ORAL

## 2017-04-01 MED ORDER — BORTEZOMIB CHEMO SQ INJECTION 3.5 MG (2.5MG/ML)
1.0400 mg/m2 | Freq: Once | INTRAMUSCULAR | Status: AC
  Administered 2017-04-01: 2.5 mg via SUBCUTANEOUS
  Filled 2017-04-01: qty 2.5

## 2017-04-01 NOTE — Patient Instructions (Signed)
[facility] Discharge Instructions for Patients Receiving Chemotherapy  Today you received the following chemotherapy agents Velcade To help prevent nausea and vomiting after your treatment, we encourage you to take your nausea medication as prescribed.   If you develop nausea and vomiting that is not controlled by your nausea medication, call the clinic.   BELOW ARE SYMPTOMS THAT SHOULD BE REPORTED IMMEDIATELY:  *FEVER GREATER THAN 100.5 F  *CHILLS WITH OR WITHOUT FEVER  NAUSEA AND VOMITING THAT IS NOT CONTROLLED WITH YOUR NAUSEA MEDICATION  *UNUSUAL SHORTNESS OF BREATH  *UNUSUAL BRUISING OR BLEEDING  TENDERNESS IN MOUTH AND THROAT WITH OR WITHOUT PRESENCE OF ULCERS  *URINARY PROBLEMS  *BOWEL PROBLEMS  UNUSUAL RASH Items with * indicate a potential emergency and should be followed up as soon as possible.  Feel free to call the clinic should you have any questions or concerns. The clinic phone number is [phone].  Please show the CHEMO ALERT CARD at check-in to the Emergency Department and triage nurse.   

## 2017-04-15 ENCOUNTER — Ambulatory Visit: Payer: Medicare Other | Admitting: Hematology and Oncology

## 2017-04-15 ENCOUNTER — Encounter: Payer: Self-pay | Admitting: Hematology and Oncology

## 2017-04-15 ENCOUNTER — Other Ambulatory Visit (HOSPITAL_BASED_OUTPATIENT_CLINIC_OR_DEPARTMENT_OTHER): Payer: Medicare Other

## 2017-04-15 ENCOUNTER — Other Ambulatory Visit: Payer: Self-pay | Admitting: Hematology and Oncology

## 2017-04-15 ENCOUNTER — Ambulatory Visit: Payer: Medicare Other

## 2017-04-15 DIAGNOSIS — C9000 Multiple myeloma not having achieved remission: Secondary | ICD-10-CM

## 2017-04-15 DIAGNOSIS — R739 Hyperglycemia, unspecified: Secondary | ICD-10-CM

## 2017-04-15 DIAGNOSIS — T50905A Adverse effect of unspecified drugs, medicaments and biological substances, initial encounter: Secondary | ICD-10-CM

## 2017-04-15 DIAGNOSIS — Z9484 Stem cells transplant status: Secondary | ICD-10-CM | POA: Diagnosis not present

## 2017-04-15 DIAGNOSIS — D6481 Anemia due to antineoplastic chemotherapy: Secondary | ICD-10-CM

## 2017-04-15 DIAGNOSIS — I952 Hypotension due to drugs: Secondary | ICD-10-CM | POA: Insufficient documentation

## 2017-04-15 DIAGNOSIS — G620 Drug-induced polyneuropathy: Secondary | ICD-10-CM | POA: Diagnosis not present

## 2017-04-15 DIAGNOSIS — T451X5A Adverse effect of antineoplastic and immunosuppressive drugs, initial encounter: Secondary | ICD-10-CM

## 2017-04-15 LAB — CBC WITH DIFFERENTIAL/PLATELET
BASO%: 2 % (ref 0.0–2.0)
Basophils Absolute: 0.1 10*3/uL (ref 0.0–0.1)
EOS%: 5 % (ref 0.0–7.0)
Eosinophils Absolute: 0.2 10*3/uL (ref 0.0–0.5)
HCT: 34.7 % — ABNORMAL LOW (ref 34.8–46.6)
HGB: 11 g/dL — ABNORMAL LOW (ref 11.6–15.9)
LYMPH%: 18.3 % (ref 14.0–49.7)
MCH: 28.4 pg (ref 25.1–34.0)
MCHC: 31.7 g/dL (ref 31.5–36.0)
MCV: 89.4 fL (ref 79.5–101.0)
MONO#: 0.7 10*3/uL (ref 0.1–0.9)
MONO%: 16.5 % — ABNORMAL HIGH (ref 0.0–14.0)
NEUT#: 2.6 10*3/uL (ref 1.5–6.5)
NEUT%: 58.2 % (ref 38.4–76.8)
Platelets: 251 10*3/uL (ref 145–400)
RBC: 3.88 10*6/uL (ref 3.70–5.45)
RDW: 17.6 % — ABNORMAL HIGH (ref 11.2–14.5)
WBC: 4.4 10*3/uL (ref 3.9–10.3)
lymph#: 0.8 10*3/uL — ABNORMAL LOW (ref 0.9–3.3)

## 2017-04-15 LAB — COMPREHENSIVE METABOLIC PANEL
ALT: 23 U/L (ref 0–55)
AST: 23 U/L (ref 5–34)
Albumin: 3.3 g/dL — ABNORMAL LOW (ref 3.5–5.0)
Alkaline Phosphatase: 81 U/L (ref 40–150)
Anion Gap: 8 mEq/L (ref 3–11)
BUN: 24.2 mg/dL (ref 7.0–26.0)
CO2: 28 mEq/L (ref 22–29)
Calcium: 9.2 mg/dL (ref 8.4–10.4)
Chloride: 105 mEq/L (ref 98–109)
Creatinine: 1 mg/dL (ref 0.6–1.1)
EGFR: >60 mL/min/{1.73_m2} (ref >60)
Glucose: 69 mg/dl — ABNORMAL LOW (ref 70–140)
Potassium: 4.2 mEq/L (ref 3.5–5.1)
Sodium: 140 mEq/L (ref 136–145)
Total Bilirubin: 0.94 mg/dL (ref 0.20–1.20)
Total Protein: 6.3 g/dL — ABNORMAL LOW (ref 6.4–8.3)

## 2017-04-15 MED ORDER — GABAPENTIN 300 MG PO CAPS: 900.0000 mg | ORAL_CAPSULE | Freq: Two times a day (BID) | ORAL | 3 refills | Status: DC

## 2017-04-15 MED ORDER — POMALIDOMIDE 2 MG PO CAPS: ORAL_CAPSULE | ORAL | 11 refills | Status: DC

## 2017-04-15 NOTE — Assessment & Plan Note (Signed)
This has resolved since dexamethasone taper

## 2017-04-15 NOTE — Assessment & Plan Note (Signed)
This is likely due to recent treatment. The patient denies recent history of bleeding such as epistaxis, hematuria or hematochezia. She is asymptomatic from the anemia. I will observe for now.   

## 2017-04-15 NOTE — Progress Notes (Signed)
[place] OFFICE PROGRESS NOTE  Patient Care Team: Lucianne Lei, MD as PCP - General (Family Medicine)  SUMMARY OF ONCOLOGIC HISTORY:   Multiple myeloma with failed remission ([place])   03/22/2016 Bone Marrow Biopsy    Bone Marrow Biopsy: Plasma cells 17% by Aspirate and 30% by CD 138 stain. Plasma cell neoplasm, Kappa Restricted Normal cytogenetics, FISH positive for +11 and +14 and +7      04/13/2016 Imaging    Skeletal survey showed lucencies within the calvarium worrisome for myeloma. No definite abnormal lytic or blastic lesions are observed elsewhere.      04/19/2016 - 09/10/2016 Chemotherapy    The patient consented to treatment with Revlimid, dexamethasone and Velcade      09/14/2016 Bone Marrow Biopsy    In summary, there is a normal cellular marrow with trilineage hematopoiesis.A mild plasmacytosis is present, but there is no definitive evidence of residual multiple myeloma      10/12/2016 - 10/12/2016 Chemotherapy    She received melphalan as conditioning chemo      10/13/2016 Bone Marrow Transplant    She received autologous stem cell transplant at [facility]      01/24/2017 Bone Marrow Biopsy    BONE MARROW: -Normocellular marrow for age (50%) with trilineage hematopoiesis. -No morphologic or immunohistochemical evidence of involvement by plasma cell neoplasm (see comment)  PERIPHERAL BLOOD: -Unremarkable (see CBC data)  COMMENT: The bone marrow is normocellular for age and shows adequate trilineage hematopoiesis without significant (<10%) dysplastic changes present. Blasts are not increased. Plasma cells represent about 2% of total cells on aspirate smears. Appropriately controlled immunohistochemical stains are performed on the core biopsy. CD138 highlights small interstitial plasma cells comprising approximately 2% of total cells. These plasma cells appear polytypic as demonstrated by kappa and lambda in-situ  hybridization.      01/25/2017 PET scan    No FDG avid osseous lesions or masses are identified.      02/18/2017 -  Chemotherapy    She received weekly Velcade, Pomalyst and Dexamethasone       INTERVAL HISTORY: Please see below for problem oriented charting. She returns for further follow-up I have reviewed her records extensively from [facility] She has achieved complete remission with recent chemotherapy Bone marrow biopsy is scheduled for next week She feels well except for peripheral neuropathy She denies recent infection and denies new bone pain She is not symptomatic from low blood sugar or low blood pressure She denies recent skin rashes  REVIEW OF SYSTEMS:   Constitutional: Denies fevers, chills or abnormal weight loss Eyes: Denies blurriness of vision Ears, nose, mouth, throat, and face: Denies mucositis or sore throat Respiratory: Denies cough, dyspnea or wheezes Cardiovascular: Denies palpitation, chest discomfort or lower extremity swelling Gastrointestinal:  Denies nausea, heartburn or change in bowel habits Skin: Denies abnormal skin rashes Lymphatics: Denies new lymphadenopathy or easy bruising Neurological:Denies numbness, tingling or new weaknesses Behavioral/Psych: Mood is stable, no new changes  All other systems were reviewed with the patient and are negative.  I have reviewed the past medical history, past surgical history, social history and family history with the patient and they are unchanged from previous note.  ALLERGIES:  is allergic to revlimid [lenalidomide].  MEDICATIONS:  Current Outpatient Medications  Medication Sig Dispense Refill  . acyclovir (ZOVIRAX) 400 MG tablet Take 1 tablet (400 mg total) by mouth 2 (two) times daily. 90 tablet 9  . ASPIRIN 81 PO Take 81 mg by  mouth daily.    . bisacodyl (BISACODYL) 5 MG EC tablet Take 5 mg by mouth daily as needed for moderate constipation.    . Calcium Carbonate-Vitamin  D (CALCIUM PLUS VITAMIN D PO) Take 1 tablet daily by mouth.    . gabapentin (NEURONTIN) 300 MG capsule Take 3 capsules (900 mg total) by mouth 2 (two) times daily. 90 capsule 3  . losartan-hydrochlorothiazide (HYZAAR) 100-25 MG tablet Take 1 tablet by mouth daily. (Patient taking differently: Take 1 tablet by mouth daily. TAKES 1/2 TABLET DAILY) 90 tablet 3  . Multiple Vitamin (MULTIVITAMIN) tablet Take 1 tablet by mouth daily.    . ondansetron (ZOFRAN) 8 MG tablet Take 1 tablet (8 mg total) by mouth 2 (two) times daily as needed (Nausea or vomiting). (Patient not taking: Reported on 08/24/2016) 30 tablet 1  . pomalidomide (POMALYST) 2 MG capsule Take with water on days 1-21. Repeat every 28 days. 21 capsule 11  . prochlorperazine (COMPAZINE) 10 MG tablet Take 1 tablet (10 mg total) by mouth every 6 (six) hours as needed (Nausea or vomiting). (Patient not taking: Reported on 06/22/2016) 30 tablet 1  . Sodium Fluoride 0.15 % PSTE Use as directed in the mouth or throat.    . traMADol (ULTRAM) 50 MG tablet Take 1 tablet (50 mg total) by mouth every 6 (six) hours as needed. for pain 90 tablet 0  . triamcinolone (NASACORT) 55 MCG/ACT AERO nasal inhaler Place 2 sprays into the nose daily. (Patient taking differently: Place 2 sprays daily as needed into the nose (allergies). Uses prn) 1 Inhaler 12   No current facility-administered medications for this visit.     PHYSICAL EXAMINATION: ECOG PERFORMANCE STATUS: 1 - Symptomatic but completely ambulatory  Vitals:   04/15/17 1058  BP: 92/63  Pulse: 61  Resp: 18  Temp: 98.2 F (36.8 C)  SpO2: 100%   Filed Weights   04/15/17 1058  Weight: 269 lb 4.8 oz (122.2 kg)    GENERAL:alert, no distress and comfortable SKIN: skin color, texture, turgor are normal, no rashes or significant lesions EYES: normal, Conjunctiva are pink and non-injected, sclera clear OROPHARYNX:no exudate, no erythema and lips, buccal mucosa, and tongue normal  NECK: supple,  thyroid normal size, non-tender, without nodularity LYMPH:  no palpable lymphadenopathy in the cervical, axillary or inguinal LUNGS: clear to auscultation and percussion with normal breathing effort HEART: regular rate & rhythm and no murmurs and no lower extremity edema ABDOMEN:abdomen soft, non-tender and normal bowel sounds Musculoskeletal:no cyanosis of digits and no clubbing  NEURO: alert & oriented x 3 with fluent speech, no focal motor/sensory deficits  LABORATORY DATA:  I have reviewed the data as listed    Component Value Date/Time   NA 140 04/15/2017 1042   K 4.2 04/15/2017 1042   CL 103 03/29/2017 2021   CO2 28 04/15/2017 1042   GLUCOSE 69 (L) 04/15/2017 1042   BUN 24.2 04/15/2017 1042   CREATININE 1.0 04/15/2017 1042   CALCIUM 9.2 04/15/2017 1042   PROT 6.3 (L) 04/15/2017 1042   ALBUMIN 3.3 (L) 04/15/2017 1042   AST 23 04/15/2017 1042   ALT 23 04/15/2017 1042   ALKPHOS 81 04/15/2017 1042   BILITOT 0.94 04/15/2017 1042   GFRNONAA 56 (L) 03/29/2017 2021   GFRAA >60 03/29/2017 2021    No results found for: SPEP, UPEP  Lab Results  Component Value Date   WBC 4.4 04/15/2017   NEUTROABS 2.6 04/15/2017   HGB 11.0 (L) 04/15/2017   HCT  34.7 (L) 04/15/2017   MCV 89.4 04/15/2017   PLT 251 04/15/2017      Chemistry      Component Value Date/Time   NA 140 04/15/2017 1042   K 4.2 04/15/2017 1042   CL 103 03/29/2017 2021   CO2 28 04/15/2017 1042   BUN 24.2 04/15/2017 1042   CREATININE 1.0 04/15/2017 1042      Component Value Date/Time   CALCIUM 9.2 04/15/2017 1042   ALKPHOS 81 04/15/2017 1042   AST 23 04/15/2017 1042   ALT 23 04/15/2017 1042   BILITOT 0.94 04/15/2017 1042       ASSESSMENT & PLAN:  Multiple myeloma with failed remission ([place]) She has excellent response to treatment Myeloma panel performed at [facility] showed complete response She is scheduled for bone marrow biopsy next week I recommend discontinuation of Velcade  given significant peripheral neuropathy She will continue Pomalyst as directed I would get assistant from my pharmacist to get new prior authorization for maintaining dose of Pomalyst at 2 mg, to be taken for 21 days, rest 7 days I recommend discontinuation of dexamethasone She would continue acyclovir for antimicrobial prophylaxis She will continue calcium with vitamin D along with Zometa every 3 months She will continue aspirin for DVT prophylaxis  Anemia due to antineoplastic chemotherapy This is likely due to recent treatment. The patient denies recent history of bleeding such as epistaxis, hematuria or hematochezia. She is asymptomatic from the anemia. I will observe for now.  Hyperglycemia, drug-induced This has resolved since dexamethasone taper  Peripheral neuropathy due to chemotherapy [facility]) She felt that her peripheral neuropathy is worse, likely exacerbated by ongoing chemotherapy and the cold weather She will continue gabapentin 900 mg twice a day I plan to discontinue Velcade  Hypotension due to drugs Her blood pressure is low It could be due to recent dexamethasone taper I recommend she watches her blood pressure carefully She is only taking a very low-dose blood pressure diuretic therapy and she might need to discontinue that in the future if her blood pressure remains low   No orders of the defined types were placed in this encounter.  All questions were answered. The patient knows to call the clinic with any problems, questions or concerns. No barriers to learning was detected. I spent 20 minutes counseling the patient face to face. The total time spent in the appointment was 25 minutes and more than 50% was on counseling and review of test results     Heath Lark, MD 04/15/2017 5:38 PM

## 2017-04-15 NOTE — Assessment & Plan Note (Signed)
Her blood pressure is low It could be due to recent dexamethasone taper I recommend she watches her blood pressure carefully She is only taking a very low-dose blood pressure diuretic therapy and she might need to discontinue that in the future if her blood pressure remains low

## 2017-04-15 NOTE — Assessment & Plan Note (Signed)
She has excellent response to treatment Myeloma panel performed at [facility] showed complete response She is scheduled for bone marrow biopsy next week I recommend discontinuation of Velcade given significant peripheral neuropathy She will continue Pomalyst as directed I would get assistant from my pharmacist to get new prior authorization for maintaining dose of Pomalyst at 2 mg, to be taken for 21 days, rest 7 days I recommend discontinuation of dexamethasone She would continue acyclovir for antimicrobial prophylaxis She will continue calcium with vitamin D along with Zometa every 3 months She will continue aspirin for DVT prophylaxis

## 2017-04-15 NOTE — Assessment & Plan Note (Signed)
She felt that her peripheral neuropathy is worse, likely exacerbated by ongoing chemotherapy and the cold weather She will continue gabapentin 900 mg twice a day I plan to discontinue Velcade

## 2017-04-17 ENCOUNTER — Telehealth: Payer: Self-pay | Admitting: Hematology and Oncology

## 2017-04-17 NOTE — Telephone Encounter (Signed)
Sw pt, gave appt for 12/24 @ 9.15am.

## 2017-04-18 LAB — KAPPA/LAMBDA LIGHT CHAINS
Ig Kappa Free Light Chain: 11.7 mg/L (ref 3.3–19.4)
Ig Lambda Free Light Chain: 10.8 mg/L (ref 5.7–26.3)
Kappa/Lambda FluidC Ratio: 1.08 (ref 0.26–1.65)

## 2017-04-19 LAB — MULTIPLE MYELOMA PANEL, SERUM
Albumin SerPl Elph-Mcnc: 3 g/dL (ref 2.9–4.4)
Albumin/Glob SerPl: 1.3 (ref 0.7–1.7)
Alpha 1: 0.2 g/dL (ref 0.0–0.4)
Alpha2 Glob SerPl Elph-Mcnc: 0.8 g/dL (ref 0.4–1.0)
B-Globulin SerPl Elph-Mcnc: 0.9 g/dL (ref 0.7–1.3)
Gamma Glob SerPl Elph-Mcnc: 0.5 g/dL (ref 0.4–1.8)
Globulin, Total: 2.5 g/dL (ref 2.2–3.9)
IgA, Qn, Serum: 29 mg/dL — ABNORMAL LOW (ref 87–352)
IgG, Qn, Serum: 570 mg/dL — ABNORMAL LOW (ref 700–1600)
IgM, Qn, Serum: 33 mg/dL (ref 26–217)
Total Protein: 5.5 g/dL — ABNORMAL LOW (ref 6.0–8.5)

## 2017-04-22 ENCOUNTER — Other Ambulatory Visit: Payer: Medicare Other

## 2017-04-22 ENCOUNTER — Ambulatory Visit: Payer: Medicare Other

## 2017-04-27 ENCOUNTER — Other Ambulatory Visit: Payer: Self-pay | Admitting: Hematology and Oncology

## 2017-04-27 NOTE — Telephone Encounter (Signed)
-

## 2017-04-29 ENCOUNTER — Ambulatory Visit: Payer: Medicare Other

## 2017-04-29 ENCOUNTER — Other Ambulatory Visit: Payer: Medicare Other

## 2017-05-09 ENCOUNTER — Telehealth: Payer: Self-pay | Admitting: Hematology and Oncology

## 2017-05-09 ENCOUNTER — Other Ambulatory Visit (HOSPITAL_BASED_OUTPATIENT_CLINIC_OR_DEPARTMENT_OTHER): Payer: Medicare Other

## 2017-05-09 ENCOUNTER — Encounter: Payer: Self-pay | Admitting: Hematology and Oncology

## 2017-05-09 ENCOUNTER — Ambulatory Visit: Payer: Medicare Other | Admitting: Hematology and Oncology

## 2017-05-09 DIAGNOSIS — C9000 Multiple myeloma not having achieved remission: Secondary | ICD-10-CM

## 2017-05-09 DIAGNOSIS — G620 Drug-induced polyneuropathy: Secondary | ICD-10-CM

## 2017-05-09 DIAGNOSIS — D701 Agranulocytosis secondary to cancer chemotherapy: Secondary | ICD-10-CM

## 2017-05-09 DIAGNOSIS — T451X5A Adverse effect of antineoplastic and immunosuppressive drugs, initial encounter: Secondary | ICD-10-CM

## 2017-05-09 LAB — CBC WITH DIFFERENTIAL/PLATELET
BASO%: 3.9 % — ABNORMAL HIGH (ref 0.0–2.0)
Basophils Absolute: 0.1 10e3/uL (ref 0.0–0.1)
EOS%: 14.8 % — ABNORMAL HIGH (ref 0.0–7.0)
Eosinophils Absolute: 0.5 10e3/uL (ref 0.0–0.5)
HCT: 37.2 % (ref 34.8–46.6)
HGB: 11.7 g/dL (ref 11.6–15.9)
LYMPH%: 38.4 % (ref 14.0–49.7)
MCH: 28.4 pg (ref 25.1–34.0)
MCHC: 31.5 g/dL (ref 31.5–36.0)
MCV: 90.3 fL (ref 79.5–101.0)
MONO#: 0.5 10e3/uL (ref 0.1–0.9)
MONO%: 16 % — ABNORMAL HIGH (ref 0.0–14.0)
NEUT#: 0.9 10e3/uL — ABNORMAL LOW (ref 1.5–6.5)
NEUT%: 26.9 % — ABNORMAL LOW (ref 38.4–76.8)
Platelets: 220 10e3/uL (ref 145–400)
RBC: 4.12 10e6/uL (ref 3.70–5.45)
RDW: 18.2 % — ABNORMAL HIGH (ref 11.2–14.5)
WBC: 3.3 10e3/uL — ABNORMAL LOW (ref 3.9–10.3)
lymph#: 1.3 10e3/uL (ref 0.9–3.3)

## 2017-05-09 LAB — COMPREHENSIVE METABOLIC PANEL
ALT: 21 U/L (ref 0–55)
AST: 20 U/L (ref 5–34)
Albumin: 3.4 g/dL — ABNORMAL LOW (ref 3.5–5.0)
Alkaline Phosphatase: 81 U/L (ref 40–150)
Anion Gap: 9 mEq/L (ref 3–11)
BUN: 14.4 mg/dL (ref 7.0–26.0)
CO2: 28 mEq/L (ref 22–29)
Calcium: 9.3 mg/dL (ref 8.4–10.4)
Chloride: 105 mEq/L (ref 98–109)
Creatinine: 0.9 mg/dL (ref 0.6–1.1)
EGFR: >60 mL/min/{1.73_m2} (ref >60)
Glucose: 61 mg/dl — ABNORMAL LOW (ref 70–140)
Potassium: 4.1 mEq/L (ref 3.5–5.1)
Sodium: 142 mEq/L (ref 136–145)
Total Bilirubin: 0.74 mg/dL (ref 0.20–1.20)
Total Protein: 6.3 g/dL — ABNORMAL LOW (ref 6.4–8.3)

## 2017-05-09 NOTE — Assessment & Plan Note (Addendum)
She felt that her peripheral neuropathy is worse, likely exacerbated by ongoing chemotherapy and the cold weather She will continue gabapentin 900 mg twice a day 

## 2017-05-09 NOTE — Assessment & Plan Note (Signed)
I have reviewed her outside records She has achieved complete remission on recent bone marrow biopsy She is experiencing multiple other side effects of treatment Velcade was discontinued especially with worsening peripheral neuropathy The dose of Pomalyst was recently increased to 4 mg and she is developing neutropenia I will hold her treatment and recommend do not restart treatment until May 17, 2017 I will see her back soon after resumption of treatment to determine whether she would benefit from dose adjustment She will continue acyclovir for antimicrobial prophylaxis She will Continue calcium with vitamin D, along with Zometa every 3 months, due next in January 2019 She is reminded to take aspirin for DVT prophylaxis

## 2017-05-09 NOTE — Assessment & Plan Note (Signed)
She is currently on a week break from treatment I do not feel strongly she can resume her treatment at the end of this week I recommend she resume her treatment on May 17, 2017 and I plan to see her back soon after that to determine if the 4 mg dose is a good dose for her We discussed neutropenic precautions

## 2017-05-09 NOTE — Progress Notes (Signed)
[place] OFFICE PROGRESS NOTE  Patient Care Team: Lucianne Lei, MD as PCP - General (Family Medicine)  SUMMARY OF ONCOLOGIC HISTORY:   Multiple myeloma with failed remission ([place])   03/22/2016 Bone Marrow Biopsy    Bone Marrow Biopsy: Plasma cells 17% by Aspirate and 30% by CD 138 stain. Plasma cell neoplasm, Kappa Restricted Normal cytogenetics, FISH positive for +11 and +14 and +7      04/13/2016 Imaging    Skeletal survey showed lucencies within the calvarium worrisome for myeloma. No definite abnormal lytic or blastic lesions are observed elsewhere.      04/19/2016 - 09/10/2016 Chemotherapy    The patient consented to treatment with Revlimid, dexamethasone and Velcade      09/14/2016 Bone Marrow Biopsy    In summary, there is a normal cellular marrow with trilineage hematopoiesis.A mild plasmacytosis is present, but there is no definitive evidence of residual multiple myeloma      10/12/2016 - 10/12/2016 Chemotherapy    She received melphalan as conditioning chemo      10/13/2016 Bone Marrow Transplant    She received autologous stem cell transplant at [facility]      01/24/2017 Bone Marrow Biopsy    BONE MARROW: -Normocellular marrow for age (50%) with trilineage hematopoiesis. -No morphologic or immunohistochemical evidence of involvement by plasma cell neoplasm (see comment)  PERIPHERAL BLOOD: -Unremarkable (see CBC data)  COMMENT: The bone marrow is normocellular for age and shows adequate trilineage hematopoiesis without significant (<10%) dysplastic changes present. Blasts are not increased. Plasma cells represent about 2% of total cells on aspirate smears. Appropriately controlled immunohistochemical stains are performed on the core biopsy. CD138 highlights small interstitial plasma cells comprising approximately 2% of total cells. These plasma cells appear polytypic as demonstrated by kappa and lambda in-situ  hybridization.      01/25/2017 PET scan    No FDG avid osseous lesions or masses are identified.      02/18/2017 - 04/01/2017 Chemotherapy    She received weekly Velcade, Pomalyst and Dexamethasone      04/15/2017 -  Chemotherapy    The patient had Pomalyst only      04/20/2017 Bone Marrow Biopsy    Bone Marrow (BM) and Peripheral Blood (PB) FINAL PATHOLOGIC DIAGNOSIS BONE MARROW: Normocellular bone marrow (40%) with normal numbers of megakaryocytes, erythroid hyperplasia and no increase in plasma cells (1%).       INTERVAL HISTORY: Please see below for problem oriented charting. She returns for further follow-up She has completed recent bone marrow biopsy through [facility] and went into complete remission She started on maintenance Pomalyst only and recently discontinue on May 06, 2017 for her week break She denies recent fever, chills or infection Her peripheral neuropathy is stable with current dose of gabapentin  REVIEW OF SYSTEMS:   Constitutional: Denies fevers, chills or abnormal weight loss Eyes: Denies blurriness of vision Ears, nose, mouth, throat, and face: Denies mucositis or sore throat Respiratory: Denies cough, dyspnea or wheezes Cardiovascular: Denies palpitation, chest discomfort or lower extremity swelling Gastrointestinal:  Denies nausea, heartburn or change in bowel habits Skin: Denies abnormal skin rashes Lymphatics: Denies new lymphadenopathy or easy bruising Neurological:Denies numbness, tingling or new weaknesses Behavioral/Psych: Mood is stable, no new changes  All other systems were reviewed with the patient and are negative.  I have reviewed the past medical history, past surgical history, social history and family history with the patient and they are unchanged from previous note.  ALLERGIES:  is allergic to revlimid [lenalidomide].  MEDICATIONS:  Current Outpatient Medications  Medication Sig Dispense Refill  .  acyclovir (ZOVIRAX) 400 MG tablet Take 1 tablet (400 mg total) by mouth 2 (two) times daily. 90 tablet 9  . ASPIRIN 81 PO Take 81 mg by mouth daily.    . bisacodyl (BISACODYL) 5 MG EC tablet Take 5 mg by mouth daily as needed for moderate constipation.    . Calcium Carbonate-Vitamin D (CALCIUM PLUS VITAMIN D PO) Take 1 tablet daily by mouth.    . gabapentin (NEURONTIN) 300 MG capsule Take 3 capsules (900 mg total) by mouth 2 (two) times daily. 90 capsule 3  . losartan-hydrochlorothiazide (HYZAAR) 100-25 MG tablet Take 1 tablet by mouth daily. (Patient taking differently: Take 1 tablet by mouth daily. TAKES 1/2 TABLET DAILY) 90 tablet 3  . Multiple Vitamin (MULTIVITAMIN) tablet Take 1 tablet by mouth daily.    . ondansetron (ZOFRAN) 8 MG tablet Take 1 tablet (8 mg total) by mouth 2 (two) times daily as needed (Nausea or vomiting). (Patient not taking: Reported on 08/24/2016) 30 tablet 1  . pomalidomide (POMALYST) 2 MG capsule Take with water on days 1-21. Repeat every 28 days. 21 capsule 11  . POMALYST 4 MG capsule TAKE 1 CAPSULE BY MOUTH ONCE DAILY FOR 21 DAYS ON AND 7 DAYS OFF 21 capsule 10  . prochlorperazine (COMPAZINE) 10 MG tablet Take 1 tablet (10 mg total) by mouth every 6 (six) hours as needed (Nausea or vomiting). (Patient not taking: Reported on 06/22/2016) 30 tablet 1  . Sodium Fluoride 0.15 % PSTE Use as directed in the mouth or throat.    . traMADol (ULTRAM) 50 MG tablet Take 1 tablet (50 mg total) by mouth every 6 (six) hours as needed. for pain 90 tablet 0  . triamcinolone (NASACORT) 55 MCG/ACT AERO nasal inhaler Place 2 sprays into the nose daily. (Patient taking differently: Place 2 sprays daily as needed into the nose (allergies). Uses prn) 1 Inhaler 12   No current facility-administered medications for this visit.     PHYSICAL EXAMINATION: ECOG PERFORMANCE STATUS: 1 - Symptomatic but completely ambulatory  Vitals:   05/09/17 0904  BP: 117/82  Pulse: (!) 59  Resp: 18  Temp:  97.9 F (36.6 C)  SpO2: 100%   Filed Weights   05/09/17 0904  Weight: 271 lb 12.8 oz (123.3 kg)    GENERAL:alert, no distress and comfortable SKIN: skin color, texture, turgor are normal, no rashes or significant lesions EYES: normal, Conjunctiva are pink and non-injected, sclera clear OROPHARYNX:no exudate, no erythema and lips, buccal mucosa, and tongue normal  NECK: supple, thyroid normal size, non-tender, without nodularity LYMPH:  no palpable lymphadenopathy in the cervical, axillary or inguinal LUNGS: clear to auscultation and percussion with normal breathing effort HEART: regular rate & rhythm and no murmurs and no lower extremity edema ABDOMEN:abdomen soft, non-tender and normal bowel sounds Musculoskeletal:no cyanosis of digits and no clubbing  NEURO: alert & oriented x 3 with fluent speech, no focal motor/sensory deficits  LABORATORY DATA:  I have reviewed the data as listed    Component Value Date/Time   NA 142 05/09/2017 0840   K 4.1 05/09/2017 0840   CL 103 03/29/2017 2021   CO2 28 05/09/2017 0840   GLUCOSE 61 (L) 05/09/2017 0840   BUN 14.4 05/09/2017 0840   CREATININE 0.9 05/09/2017 0840   CALCIUM 9.3 05/09/2017 0840   PROT 6.3 (L) 05/09/2017 0840   ALBUMIN 3.4 (L)  05/09/2017 0840   AST 20 05/09/2017 0840   ALT 21 05/09/2017 0840   ALKPHOS 81 05/09/2017 0840   BILITOT 0.74 05/09/2017 0840   GFRNONAA 56 (L) 03/29/2017 2021   GFRAA >60 03/29/2017 2021    No results found for: SPEP, UPEP  Lab Results  Component Value Date   WBC 3.3 (L) 05/09/2017   NEUTROABS 0.9 (L) 05/09/2017   HGB 11.7 05/09/2017   HCT 37.2 05/09/2017   MCV 90.3 05/09/2017   PLT 220 05/09/2017      Chemistry      Component Value Date/Time   NA 142 05/09/2017 0840   K 4.1 05/09/2017 0840   CL 103 03/29/2017 2021   CO2 28 05/09/2017 0840   BUN 14.4 05/09/2017 0840   CREATININE 0.9 05/09/2017 0840      Component Value Date/Time   CALCIUM 9.3 05/09/2017 0840   ALKPHOS 81  05/09/2017 0840   AST 20 05/09/2017 0840   ALT 21 05/09/2017 0840   BILITOT 0.74 05/09/2017 0840     ASSESSMENT & PLAN:  Multiple myeloma with failed remission ([place]) I have reviewed her outside records She has achieved complete remission on recent bone marrow biopsy She is experiencing multiple other side effects of treatment Velcade was discontinued especially with worsening peripheral neuropathy The dose of Pomalyst was recently increased to 4 mg and she is developing neutropenia I will hold her treatment and recommend do not restart treatment until May 17, 2017 I will see her back soon after resumption of treatment to determine whether she would benefit from dose adjustment She will continue acyclovir for antimicrobial prophylaxis She will Continue calcium with vitamin D, along with Zometa every 3 months, due next in January 2019 She is reminded to take aspirin for DVT prophylaxis  Peripheral neuropathy due to chemotherapy [facility]) She felt that her peripheral neuropathy is worse, likely exacerbated by ongoing chemotherapy and the cold weather She will continue gabapentin 900 mg twice a day  Chemotherapy induced neutropenia ([place]) She is currently on a week break from treatment I do not feel strongly she can resume her treatment at the end of this week I recommend she resume her treatment on May 17, 2017 and I plan to see her back soon after that to determine if the 4 mg dose is a good dose for her We discussed neutropenic precautions   No orders of the defined types were placed in this encounter.  All questions were answered. The patient knows to call the clinic with any problems, questions or concerns. No barriers to learning was detected. I spent 15 minutes counseling the patient face to face. The total time spent in the appointment was 20 minutes and more than 50% was on counseling and review of test results     Heath Lark, MD 05/09/2017 12:09 PM

## 2017-05-09 NOTE — Telephone Encounter (Signed)
Gave avs and calendar for January  °

## 2017-05-11 ENCOUNTER — Telehealth: Payer: Self-pay | Admitting: Hematology and Oncology

## 2017-05-11 LAB — KAPPA/LAMBDA LIGHT CHAINS
Ig Kappa Free Light Chain: 17.4 mg/L (ref 3.3–19.4)
Ig Lambda Free Light Chain: 13.8 mg/L (ref 5.7–26.3)
Kappa/Lambda FluidC Ratio: 1.26 (ref 0.26–1.65)

## 2017-05-11 NOTE — Telephone Encounter (Signed)
Called regarding 1/9

## 2017-05-12 LAB — MULTIPLE MYELOMA PANEL, SERUM
Albumin SerPl Elph-Mcnc: 3.3 g/dL (ref 2.9–4.4)
Albumin/Glob SerPl: 1.2 (ref 0.7–1.7)
Alpha 1: 0.2 g/dL (ref 0.0–0.4)
Alpha2 Glob SerPl Elph-Mcnc: 0.9 g/dL (ref 0.4–1.0)
B-Globulin SerPl Elph-Mcnc: 1 g/dL (ref 0.7–1.3)
Gamma Glob SerPl Elph-Mcnc: 0.7 g/dL (ref 0.4–1.8)
Globulin, Total: 2.8 g/dL (ref 2.2–3.9)
IgA, Qn, Serum: 49 mg/dL — ABNORMAL LOW (ref 87–352)
IgG, Qn, Serum: 664 mg/dL — ABNORMAL LOW (ref 700–1600)
IgM, Qn, Serum: 26 mg/dL (ref 26–217)
Total Protein: 6.1 g/dL (ref 6.0–8.5)

## 2017-05-16 ENCOUNTER — Encounter: Payer: Self-pay | Admitting: Hematology and Oncology

## 2017-05-20 ENCOUNTER — Other Ambulatory Visit: Payer: Self-pay | Admitting: Hematology and Oncology

## 2017-05-25 ENCOUNTER — Encounter: Payer: Self-pay | Admitting: Hematology and Oncology

## 2017-05-25 ENCOUNTER — Telehealth: Payer: Self-pay | Admitting: Hematology and Oncology

## 2017-05-25 ENCOUNTER — Other Ambulatory Visit: Payer: Self-pay | Admitting: *Deleted

## 2017-05-25 ENCOUNTER — Inpatient Hospital Stay: Payer: Medicare Other

## 2017-05-25 ENCOUNTER — Ambulatory Visit: Payer: Medicare Other

## 2017-05-25 ENCOUNTER — Inpatient Hospital Stay: Payer: Medicare Other | Attending: Hematology and Oncology | Admitting: Hematology and Oncology

## 2017-05-25 DIAGNOSIS — C9000 Multiple myeloma not having achieved remission: Secondary | ICD-10-CM

## 2017-05-25 DIAGNOSIS — Z23 Encounter for immunization: Secondary | ICD-10-CM | POA: Insufficient documentation

## 2017-05-25 DIAGNOSIS — J309 Allergic rhinitis, unspecified: Secondary | ICD-10-CM

## 2017-05-25 DIAGNOSIS — J3089 Other allergic rhinitis: Secondary | ICD-10-CM

## 2017-05-25 LAB — COMPREHENSIVE METABOLIC PANEL
ALT: 13 U/L (ref 0–55)
AST: 16 U/L (ref 5–34)
Albumin: 3.4 g/dL — ABNORMAL LOW (ref 3.5–5.0)
Alkaline Phosphatase: 91 U/L (ref 40–150)
Anion gap: 11 (ref 3–11)
BUN: 15 mg/dL (ref 7–26)
CO2: 24 mmol/L (ref 22–29)
Calcium: 9.4 mg/dL (ref 8.4–10.4)
Chloride: 105 mmol/L (ref 98–109)
Creatinine, Ser: 0.99 mg/dL (ref 0.60–1.10)
GFR calc Af Amer: >60 mL/min (ref >60)
GFR calc non Af Amer: 59 mL/min — ABNORMAL LOW (ref >60)
Glucose, Bld: 83 mg/dL (ref 70–140)
Potassium: 3.6 mmol/L (ref 3.3–4.7)
Sodium: 140 mmol/L (ref 136–145)
Total Bilirubin: 0.8 mg/dL (ref 0.2–1.2)
Total Protein: 6.8 g/dL (ref 6.4–8.3)

## 2017-05-25 LAB — CBC WITH DIFFERENTIAL/PLATELET
Abs Granulocyte: 4.9 10*3/uL (ref 1.5–6.5)
Basophils Absolute: 0 10*3/uL (ref 0.0–0.1)
Basophils Relative: 1 %
Eosinophils Absolute: 0.3 10*3/uL (ref 0.0–0.5)
Eosinophils Relative: 5 %
HCT: 38.8 % (ref 34.8–46.6)
Hemoglobin: 12.5 g/dL (ref 11.6–15.9)
Lymphocytes Relative: 14 %
Lymphs Abs: 0.9 10*3/uL (ref 0.9–3.3)
MCH: 28.8 pg (ref 25.1–34.0)
MCHC: 32.3 g/dL (ref 31.5–36.0)
MCV: 89.1 fL (ref 79.5–101.0)
Monocytes Absolute: 0.3 10*3/uL (ref 0.1–0.9)
Monocytes Relative: 5 %
Neutro Abs: 4.9 10*3/uL (ref 1.5–6.5)
Neutrophils Relative %: 75 %
Platelets: 214 10*3/uL (ref 145–400)
RBC: 4.35 MIL/uL (ref 3.70–5.45)
RDW: 18.1 % — ABNORMAL HIGH (ref 11.2–16.1)
WBC: 6.5 10*3/uL (ref 3.9–10.3)

## 2017-05-25 MED ORDER — POMALIDOMIDE 4 MG PO CAPS: ORAL_CAPSULE | ORAL | 10 refills | Status: DC

## 2017-05-25 MED ORDER — ZOLEDRONIC ACID 4 MG/100ML IV SOLN
4.0000 mg | Freq: Once | INTRAVENOUS | Status: AC
  Administered 2017-05-25: 4 mg via INTRAVENOUS
  Filled 2017-05-25: qty 100

## 2017-05-25 MED ORDER — ACYCLOVIR 400 MG PO TABS: 400.0000 mg | ORAL_TABLET | Freq: Two times a day (BID) | ORAL | 9 refills | Status: DC

## 2017-05-25 MED ORDER — SODIUM CHLORIDE 0.9 % IV SOLN
Freq: Once | INTRAVENOUS | Status: AC
  Administered 2017-05-25: 10:00:00 via INTRAVENOUS

## 2017-05-25 NOTE — Patient Instructions (Signed)
[facility] Discharge Instructions for Patients Receiving Chemotherapy  Today you received the following chemotherapy agents Zometa  To help prevent nausea and vomiting after your treatment, we encourage you to take your nausea medication as directed  If you develop nausea and vomiting that is not controlled by your nausea medication, call the clinic.   BELOW ARE SYMPTOMS THAT SHOULD BE REPORTED IMMEDIATELY:  *FEVER GREATER THAN 100.5 F  *CHILLS WITH OR WITHOUT FEVER  NAUSEA AND VOMITING THAT IS NOT CONTROLLED WITH YOUR NAUSEA MEDICATION  *UNUSUAL SHORTNESS OF BREATH  *UNUSUAL BRUISING OR BLEEDING  TENDERNESS IN MOUTH AND THROAT WITH OR WITHOUT PRESENCE OF ULCERS  *URINARY PROBLEMS  *BOWEL PROBLEMS  UNUSUAL RASH Items with * indicate a potential emergency and should be followed up as soon as possible.  Feel free to call the clinic should you have any questions or concerns. The clinic phone number is [phone].  Please show the CHEMO ALERT CARD at check-in to the Emergency Department and triage nurse.   

## 2017-05-25 NOTE — Assessment & Plan Note (Signed)
I have reviewed her outside records She has achieved complete remission on recent bone marrow biopsy She is experiencing multiple other side effects of treatment Velcade was discontinued especially with worsening peripheral neuropathy The dose of Pomalyst was recently increased to 4 mg and she is developing neutropenia, resolved Recommend we continue Pomalyst at 4 mg daily from days 1-21, rest 7 days She will continue acyclovir for antimicrobial prophylaxis She will Continue calcium with vitamin D, along with Zometa every 3 months, due today She is reminded to take aspirin for DVT prophylaxis

## 2017-05-25 NOTE — Assessment & Plan Note (Signed)
She has signs and symptoms of sinus congestion do not believe she had bacterial infection I recommend over-the-counter remedies

## 2017-05-25 NOTE — Progress Notes (Signed)
[place] OFFICE PROGRESS NOTE  Patient Care Team: Lucianne Lei, MD as PCP - General (Family Medicine)  SUMMARY OF ONCOLOGIC HISTORY:   Multiple myeloma with failed remission ([place])   03/22/2016 Bone Marrow Biopsy    Bone Marrow Biopsy: Plasma cells 17% by Aspirate and 30% by CD 138 stain. Plasma cell neoplasm, Kappa Restricted Normal cytogenetics, FISH positive for +11 and +14 and +7      04/13/2016 Imaging    Skeletal survey showed lucencies within the calvarium worrisome for myeloma. No definite abnormal lytic or blastic lesions are observed elsewhere.      04/19/2016 - 09/10/2016 Chemotherapy    The patient consented to treatment with Revlimid, dexamethasone and Velcade      09/14/2016 Bone Marrow Biopsy    In summary, there is a normal cellular marrow with trilineage hematopoiesis.A mild plasmacytosis is present, but there is no definitive evidence of residual multiple myeloma      10/12/2016 - 10/12/2016 Chemotherapy    She received melphalan as conditioning chemo      10/13/2016 Bone Marrow Transplant    She received autologous stem cell transplant at [facility]      01/24/2017 Bone Marrow Biopsy    BONE MARROW: -Normocellular marrow for age (50%) with trilineage hematopoiesis. -No morphologic or immunohistochemical evidence of involvement by plasma cell neoplasm (see comment)  PERIPHERAL BLOOD: -Unremarkable (see CBC data)  COMMENT: The bone marrow is normocellular for age and shows adequate trilineage hematopoiesis without significant (<10%) dysplastic changes present. Blasts are not increased. Plasma cells represent about 2% of total cells on aspirate smears. Appropriately controlled immunohistochemical stains are performed on the core biopsy. CD138 highlights small interstitial plasma cells comprising approximately 2% of total cells. These plasma cells appear polytypic as demonstrated by kappa and lambda in-situ  hybridization.      01/25/2017 PET scan    No FDG avid osseous lesions or masses are identified.      02/18/2017 - 04/01/2017 Chemotherapy    She received weekly Velcade, Pomalyst and Dexamethasone      04/15/2017 -  Chemotherapy    The patient had Pomalyst only      04/20/2017 Bone Marrow Biopsy    Bone Marrow (BM) and Peripheral Blood (PB) FINAL PATHOLOGIC DIAGNOSIS BONE MARROW: Normocellular bone marrow (40%) with normal numbers of megakaryocytes, erythroid hyperplasia and no increase in plasma cells (1%).       INTERVAL HISTORY: Please see below for problem oriented charting. She returns for further follow-up She continues to have intermittent nasal congestion and cough No recent fever or chills Peripheral neuropathy is stable She denies new bone pain  She complained of mild fatigue. She denies recent dental issues  REVIEW OF SYSTEMS:   Constitutional: Denies fevers, chills or abnormal weight loss Eyes: Denies blurriness of vision Ears, nose, mouth, throat, and face: Denies mucositis or sore throat Cardiovascular: Denies palpitation, chest discomfort or lower extremity swelling Gastrointestinal:  Denies nausea, heartburn or change in bowel habits Skin: Denies abnormal skin rashes Lymphatics: Denies new lymphadenopathy or easy bruising Neurological:Denies numbness, tingling or new weaknesses Behavioral/Psych: Mood is stable, no new changes  All other systems were reviewed with the patient and are negative.  I have reviewed the past medical history, past surgical history, social history and family history with the patient and they are unchanged from previous note.  ALLERGIES:  is allergic to revlimid [lenalidomide].  MEDICATIONS:  Current Outpatient Medications  Medication Sig Dispense Refill  . calcium-vitamin D (OSCAL  WITH D) 500-200 MG-UNIT tablet Take 2 tablets by mouth daily.    . Chlorphen-PE-Acetaminophen 4-10-325 MG TABS Take 1 tablet by mouth every 6 (six)  hours as needed for allergies.    . folic acid (FOLVITE) 1 MG tablet Take 1 mg by mouth daily.    Marland Kitchen acyclovir (ZOVIRAX) 400 MG tablet Take 1 tablet (400 mg total) by mouth 2 (two) times daily. 90 tablet 9  . ASPIRIN 81 PO Take 81 mg by mouth daily.    . bisacodyl (BISACODYL) 5 MG EC tablet Take 5 mg by mouth daily as needed for moderate constipation.    . Calcium Carbonate-Vitamin D (CALCIUM PLUS VITAMIN D PO) Take 1 tablet daily by mouth.    . gabapentin (NEURONTIN) 300 MG capsule Take 3 capsules (900 mg total) by mouth 2 (two) times daily. 90 capsule 3  . losartan-hydrochlorothiazide (HYZAAR) 100-25 MG tablet Take 1 tablet by mouth daily. (Patient taking differently: Take 1 tablet by mouth daily. TAKES 1/2 TABLET DAILY) 90 tablet 3  . Multiple Vitamin (MULTIVITAMIN) tablet Take 1 tablet by mouth daily.    . ondansetron (ZOFRAN) 8 MG tablet Take 1 tablet (8 mg total) by mouth 2 (two) times daily as needed (Nausea or vomiting). (Patient not taking: Reported on 08/24/2016) 30 tablet 1  . pomalidomide (POMALYST) 4 MG capsule TAKE 1 CAPSULE BY MOUTH ONCE DAILY FOR 21 DAYS ON AND 7 DAYS OFF 21 capsule 10  . prochlorperazine (COMPAZINE) 10 MG tablet Take 1 tablet (10 mg total) by mouth every 6 (six) hours as needed (Nausea or vomiting). (Patient not taking: Reported on 06/22/2016) 30 tablet 1  . Sodium Fluoride 0.15 % PSTE Use as directed in the mouth or throat.    . traMADol (ULTRAM) 50 MG tablet Take 1 tablet (50 mg total) by mouth every 6 (six) hours as needed. for pain 90 tablet 0  . triamcinolone (NASACORT) 55 MCG/ACT AERO nasal inhaler Place 2 sprays into the nose daily. (Patient taking differently: Place 2 sprays daily as needed into the nose (allergies). Uses prn) 1 Inhaler 12   No current facility-administered medications for this visit.    Facility-Administered Medications Ordered in Other Visits  Medication Dose Route Frequency Provider Last Rate Last Dose  . 0.9 %  sodium chloride infusion    Intravenous Once Alvy Bimler, Graysen Depaula, MD      . Zoledronic Acid (ZOMETA) IVPB 4 mg  4 mg Intravenous Once Alvy Bimler, Dewana Ammirati, MD        PHYSICAL EXAMINATION: ECOG PERFORMANCE STATUS: 1 - Symptomatic but completely ambulatory  Vitals:   05/25/17 0930  BP: 121/70  Pulse: 78  Resp: 18  Temp: (!) 97.4 F (36.3 C)  SpO2: 100%   Filed Weights   05/25/17 0930  Weight: 269 lb 8 oz (122.2 kg)    GENERAL:alert, no distress and comfortable SKIN: skin color, texture, turgor are normal, no rashes or significant lesions EYES: normal, Conjunctiva are pink and non-injected, sclera clear OROPHARYNX:no exudate, no erythema and lips, buccal mucosa, and tongue normal  NECK: supple, thyroid normal size, non-tender, without nodularity LYMPH:  no palpable lymphadenopathy in the cervical, axillary or inguinal LUNGS: clear to auscultation and percussion with normal breathing effort HEART: regular rate & rhythm and no murmurs and no lower extremity edema ABDOMEN:abdomen soft, non-tender and normal bowel sounds Musculoskeletal:no cyanosis of digits and no clubbing  NEURO: alert & oriented x 3 with fluent speech, no focal motor/sensory deficits  LABORATORY DATA:  I have reviewed the  data as listed    Component Value Date/Time   NA 140 05/25/2017 0900   NA 142 05/09/2017 0840   K 3.6 05/25/2017 0900   K 4.1 05/09/2017 0840   CL 105 05/25/2017 0900   CO2 24 05/25/2017 0900   CO2 28 05/09/2017 0840   GLUCOSE 83 05/25/2017 0900   GLUCOSE 61 (L) 05/09/2017 0840   BUN 15 05/25/2017 0900   BUN 14.4 05/09/2017 0840   CREATININE 0.99 05/25/2017 0900   CREATININE 0.9 05/09/2017 0840   CALCIUM 9.4 05/25/2017 0900   CALCIUM 9.3 05/09/2017 0840   PROT 6.8 05/25/2017 0900   PROT 6.1 05/09/2017 0840   PROT 6.3 (L) 05/09/2017 0840   ALBUMIN 3.4 (L) 05/25/2017 0900   ALBUMIN 3.4 (L) 05/09/2017 0840   AST 16 05/25/2017 0900   AST 20 05/09/2017 0840   ALT 13 05/25/2017 0900   ALT 21 05/09/2017 0840   ALKPHOS 91  05/25/2017 0900   ALKPHOS 81 05/09/2017 0840   BILITOT 0.8 05/25/2017 0900   BILITOT 0.74 05/09/2017 0840   GFRNONAA 59 (L) 05/25/2017 0900   GFRAA >60 05/25/2017 0900    No results found for: SPEP, UPEP  Lab Results  Component Value Date   WBC 6.5 05/25/2017   NEUTROABS 4.9 05/25/2017   HGB 12.5 05/25/2017   HCT 38.8 05/25/2017   MCV 89.1 05/25/2017   PLT 214 05/25/2017      Chemistry      Component Value Date/Time   NA 140 05/25/2017 0900   NA 142 05/09/2017 0840   K 3.6 05/25/2017 0900   K 4.1 05/09/2017 0840   CL 105 05/25/2017 0900   CO2 24 05/25/2017 0900   CO2 28 05/09/2017 0840   BUN 15 05/25/2017 0900   BUN 14.4 05/09/2017 0840   CREATININE 0.99 05/25/2017 0900   CREATININE 0.9 05/09/2017 0840      Component Value Date/Time   CALCIUM 9.4 05/25/2017 0900   CALCIUM 9.3 05/09/2017 0840   ALKPHOS 91 05/25/2017 0900   ALKPHOS 81 05/09/2017 0840   AST 16 05/25/2017 0900   AST 20 05/09/2017 0840   ALT 13 05/25/2017 0900   ALT 21 05/09/2017 0840   BILITOT 0.8 05/25/2017 0900   BILITOT 0.74 05/09/2017 0840      ASSESSMENT & PLAN:  Multiple myeloma with failed remission ([place]) I have reviewed her outside records She has achieved complete remission on recent bone marrow biopsy She is experiencing multiple other side effects of treatment Velcade was discontinued especially with worsening peripheral neuropathy The dose of Pomalyst was recently increased to 4 mg and she is developing neutropenia, resolved Recommend we continue Pomalyst at 4 mg daily from days 1-21, rest 7 days She will continue acyclovir for antimicrobial prophylaxis She will Continue calcium with vitamin D, along with Zometa every 3 months, due today She is reminded to take aspirin for DVT prophylaxis  Allergic rhinitis She has signs and symptoms of sinus congestion do not believe she had bacterial infection I recommend over-the-counter remedies   No orders of the defined types were placed  in this encounter.  All questions were answered. The patient knows to call the clinic with any problems, questions or concerns. No barriers to learning was detected. I spent 15 minutes counseling the patient face to face. The total time spent in the appointment was 20 minutes and more than 50% was on counseling and review of test results     Heath Lark, MD 05/25/2017 10:36 AM

## 2017-05-25 NOTE — Telephone Encounter (Signed)
Gave patient AVS and calendar of upcoming February appointments.  °

## 2017-05-30 ENCOUNTER — Encounter: Payer: Self-pay | Admitting: Hematology and Oncology

## 2017-06-03 ENCOUNTER — Other Ambulatory Visit: Payer: Self-pay | Admitting: *Deleted

## 2017-06-03 ENCOUNTER — Other Ambulatory Visit: Payer: Self-pay | Admitting: Hematology and Oncology

## 2017-06-06 ENCOUNTER — Telehealth: Payer: Self-pay | Admitting: Hematology and Oncology

## 2017-06-06 NOTE — Telephone Encounter (Signed)
Scheduled appts per 118 sch msg - spoke with patient and confirmed appts.

## 2017-06-08 ENCOUNTER — Telehealth: Payer: Self-pay | Admitting: Pharmacy Technician

## 2017-06-08 NOTE — Telephone Encounter (Signed)
Oral Oncology Patient Advocate Encounter  Received notification from the patient that [facility] informed her that a new prior authorization for Pomalyst is required.  PA submitted on CoverMyMeds Key [id number] Status is pending  Oral Oncology Clinic will continue to follow.  Fabio Asa. Melynda Keller, [place] Patient [place] [phone] 06/08/2017 2:55 PM

## 2017-06-08 NOTE — Telephone Encounter (Signed)
Oral Oncology Patient Advocate Encounter  Prior Authorization for Pomalyst has been approved.    PA# [id number] Effective dates: 06/08/2017 through 05/16/2018  Oral Oncology Clinic will continue to follow.   Fabio Asa. Melynda Keller, [place] Patient [place] [phone] 06/08/2017 3:29 PM

## 2017-06-10 ENCOUNTER — Other Ambulatory Visit: Payer: Self-pay | Admitting: *Deleted

## 2017-06-10 ENCOUNTER — Other Ambulatory Visit: Payer: Self-pay | Admitting: Hematology and Oncology

## 2017-06-10 ENCOUNTER — Inpatient Hospital Stay: Payer: Medicare Other

## 2017-06-10 VITALS — BP 114/64 | HR 67 | Temp 97.6°F | Resp 18

## 2017-06-10 DIAGNOSIS — C9000 Multiple myeloma not having achieved remission: Secondary | ICD-10-CM | POA: Diagnosis not present

## 2017-06-10 MED ORDER — HAEMOPHILUS B POLYSAC CONJ VAC IM SOLR
0.5000 mL | Freq: Once | INTRAMUSCULAR | Status: AC
  Administered 2017-06-10: 0.5 mL via INTRAMUSCULAR
  Filled 2017-06-10: qty 0.5

## 2017-06-10 MED ORDER — PNEUMOCOCCAL 13-VAL CONJ VACC IM SUSP
0.5000 mL | Freq: Once | INTRAMUSCULAR | Status: AC
  Administered 2017-06-10: 0.5 mL via INTRAMUSCULAR
  Filled 2017-06-10: qty 0.5

## 2017-06-10 MED ORDER — DTAP-HEPATITIS B RECOMB-IPV IM SUSP
0.5000 mL | Freq: Once | INTRAMUSCULAR | Status: AC
  Administered 2017-06-10: 0.5 mL via INTRAMUSCULAR
  Filled 2017-06-10: qty 0.5

## 2017-06-10 MED ORDER — HEPATITIS B VAC RECOMBINANT 20 MCG/ML IJ SUSP: 2.0000 mL | Freq: Once | INTRAMUSCULAR | Status: DC

## 2017-06-10 NOTE — Telephone Encounter (Signed)
Can you call that in? Thanks

## 2017-06-13 NOTE — Telephone Encounter (Signed)
Can you call that in?

## 2017-06-22 ENCOUNTER — Telehealth: Payer: Self-pay | Admitting: Hematology and Oncology

## 2017-06-22 ENCOUNTER — Inpatient Hospital Stay: Payer: Medicare Other | Attending: Hematology and Oncology

## 2017-06-22 ENCOUNTER — Encounter: Payer: Self-pay | Admitting: Hematology and Oncology

## 2017-06-22 ENCOUNTER — Inpatient Hospital Stay: Payer: Medicare Other | Admitting: Hematology and Oncology

## 2017-06-22 DIAGNOSIS — G620 Drug-induced polyneuropathy: Secondary | ICD-10-CM | POA: Diagnosis not present

## 2017-06-22 DIAGNOSIS — T451X5A Adverse effect of antineoplastic and immunosuppressive drugs, initial encounter: Secondary | ICD-10-CM

## 2017-06-22 DIAGNOSIS — D701 Agranulocytosis secondary to cancer chemotherapy: Secondary | ICD-10-CM

## 2017-06-22 DIAGNOSIS — C9000 Multiple myeloma not having achieved remission: Secondary | ICD-10-CM | POA: Diagnosis not present

## 2017-06-22 LAB — CBC WITH DIFFERENTIAL/PLATELET
Basophils Absolute: 0.1 10*3/uL (ref 0.0–0.1)
Basophils Relative: 3 %
Eosinophils Absolute: 0.3 10*3/uL (ref 0.0–0.5)
Eosinophils Relative: 9 %
HCT: 36.8 % (ref 34.8–46.6)
Hemoglobin: 11.9 g/dL (ref 11.6–15.9)
Lymphocytes Relative: 15 %
Lymphs Abs: 0.5 10*3/uL — ABNORMAL LOW (ref 0.9–3.3)
MCH: 29.3 pg (ref 25.1–34.0)
MCHC: 32.2 g/dL (ref 31.5–36.0)
MCV: 90.7 fL (ref 79.5–101.0)
Monocytes Absolute: 0.2 10*3/uL (ref 0.1–0.9)
Monocytes Relative: 7 %
Neutro Abs: 2.2 10*3/uL (ref 1.5–6.5)
Neutrophils Relative %: 66 %
Platelets: 244 10*3/uL (ref 145–400)
RBC: 4.06 MIL/uL (ref 3.70–5.45)
RDW: 17.3 % — ABNORMAL HIGH (ref 11.2–14.5)
WBC: 3.4 10*3/uL — ABNORMAL LOW (ref 3.9–10.3)

## 2017-06-22 LAB — COMPREHENSIVE METABOLIC PANEL
ALT: 20 U/L (ref 0–55)
AST: 22 U/L (ref 5–34)
Albumin: 3.4 g/dL — ABNORMAL LOW (ref 3.5–5.0)
Alkaline Phosphatase: 98 U/L (ref 40–150)
Anion gap: 8 (ref 3–11)
BUN: 19 mg/dL (ref 7–26)
CO2: 30 mmol/L — ABNORMAL HIGH (ref 22–29)
Calcium: 9.7 mg/dL (ref 8.4–10.4)
Chloride: 101 mmol/L (ref 98–109)
Creatinine, Ser: 0.87 mg/dL (ref 0.60–1.10)
GFR calc Af Amer: >60 mL/min (ref >60)
GFR calc non Af Amer: >60 mL/min (ref >60)
Glucose, Bld: 86 mg/dL (ref 70–140)
Potassium: 4.2 mmol/L (ref 3.5–5.1)
Sodium: 139 mmol/L (ref 136–145)
Total Bilirubin: 0.5 mg/dL (ref 0.2–1.2)
Total Protein: 6.7 g/dL (ref 6.4–8.3)

## 2017-06-22 MED ORDER — POMALIDOMIDE 2 MG PO CAPS: 2.0000 mg | ORAL_CAPSULE | Freq: Every day | ORAL | 11 refills | Status: DC

## 2017-06-22 NOTE — Assessment & Plan Note (Signed)
She has mild leukopenia but not symptomatic Plan to reduce dose of Pomalyst as above

## 2017-06-22 NOTE — Assessment & Plan Note (Signed)
I have reviewed her outside records She has achieved complete remission on recent bone marrow biopsy She is experiencing multiple other side effects of treatment Velcade was discontinued especially with worsening peripheral neuropathy I recommend reducing the dose of Pomalyst to 2 mg from her transplant physician starting next cycle She will continue acyclovir for antimicrobial prophylaxis She will Continue calcium with vitamin D, along with Zometa every 3 months, due today She is reminded to take aspirin for DVT prophylaxis

## 2017-06-22 NOTE — Progress Notes (Signed)
[place] OFFICE PROGRESS NOTE  Patient Care Team: Lucianne Lei, MD as PCP - General (Family Medicine)  SUMMARY OF ONCOLOGIC HISTORY:   Multiple myeloma with failed remission ([place])   03/22/2016 Bone Marrow Biopsy    Bone Marrow Biopsy: Plasma cells 17% by Aspirate and 30% by CD 138 stain. Plasma cell neoplasm, Kappa Restricted Normal cytogenetics, FISH positive for +11 and +14 and +7      04/13/2016 Imaging    Skeletal survey showed lucencies within the calvarium worrisome for myeloma. No definite abnormal lytic or blastic lesions are observed elsewhere.      04/19/2016 - 09/10/2016 Chemotherapy    The patient consented to treatment with Revlimid, dexamethasone and Velcade      09/14/2016 Bone Marrow Biopsy    In summary, there is a normal cellular marrow with trilineage hematopoiesis.A mild plasmacytosis is present, but there is no definitive evidence of residual multiple myeloma      10/12/2016 - 10/12/2016 Chemotherapy    She received melphalan as conditioning chemo      10/13/2016 Bone Marrow Transplant    She received autologous stem cell transplant at [facility]      01/24/2017 Bone Marrow Biopsy    BONE MARROW: -Normocellular marrow for age (50%) with trilineage hematopoiesis. -No morphologic or immunohistochemical evidence of involvement by plasma cell neoplasm (see comment)  PERIPHERAL BLOOD: -Unremarkable (see CBC data)  COMMENT: The bone marrow is normocellular for age and shows adequate trilineage hematopoiesis without significant (<10%) dysplastic changes present. Blasts are not increased. Plasma cells represent about 2% of total cells on aspirate smears. Appropriately controlled immunohistochemical stains are performed on the core biopsy. CD138 highlights small interstitial plasma cells comprising approximately 2% of total cells. These plasma cells appear polytypic as demonstrated by kappa and lambda in-situ  hybridization.      01/25/2017 PET scan    No FDG avid osseous lesions or masses are identified.      02/18/2017 - 04/01/2017 Chemotherapy    She received weekly Velcade, Pomalyst and Dexamethasone      04/15/2017 -  Chemotherapy    The patient had Pomalyst only      04/20/2017 Bone Marrow Biopsy    Bone Marrow (BM) and Peripheral Blood (PB) FINAL PATHOLOGIC DIAGNOSIS BONE MARROW: Normocellular bone marrow (40%) with normal numbers of megakaryocytes, erythroid hyperplasia and no increase in plasma cells (1%).       INTERVAL HISTORY: Please see below for problem oriented charting. She returns for further follow-up Neuropathy is stable She denies recent infection She denies new bone pain Overall, she feels well while on current treatment  REVIEW OF SYSTEMS:   Constitutional: Denies fevers, chills or abnormal weight loss Eyes: Denies blurriness of vision Ears, nose, mouth, throat, and face: Denies mucositis or sore throat Respiratory: Denies cough, dyspnea or wheezes Cardiovascular: Denies palpitation, chest discomfort or lower extremity swelling Gastrointestinal:  Denies nausea, heartburn or change in bowel habits Skin: Denies abnormal skin rashes Lymphatics: Denies new lymphadenopathy or easy bruising Neurological:Denies numbness, tingling or new weaknesses Behavioral/Psych: Mood is stable, no new changes  All other systems were reviewed with the patient and are negative.  I have reviewed the past medical history, past surgical history, social history and family history with the patient and they are unchanged from previous note.  ALLERGIES:  is allergic to revlimid [lenalidomide].  MEDICATIONS:  Current Outpatient Medications  Medication Sig Dispense Refill  . acyclovir (ZOVIRAX) 400 MG tablet Take 1 tablet (400 mg total)  by mouth 2 (two) times daily. 90 tablet 9  . ASPIRIN 81 PO Take 81 mg by mouth daily.    . bisacodyl (BISACODYL) 5 MG EC tablet Take 5 mg by mouth  daily as needed for moderate constipation.    . Calcium Carbonate-Vitamin D (CALCIUM PLUS VITAMIN D PO) Take 1 tablet daily by mouth.    . calcium-vitamin D (OSCAL WITH D) 500-200 MG-UNIT tablet Take 2 tablets by mouth daily.    . Chlorphen-PE-Acetaminophen 4-10-325 MG TABS Take 1 tablet by mouth every 6 (six) hours as needed for allergies.    . folic acid (FOLVITE) 1 MG tablet Take 1 mg by mouth daily.    Marland Kitchen gabapentin (NEURONTIN) 300 MG capsule Take 3 capsules (900 mg total) by mouth 2 (two) times daily. 90 capsule 3  . gabapentin (NEURONTIN) 300 MG capsule TAKE 3 CAPSULES BY MOUTH TWICE DAILY 90 capsule 3  . losartan-hydrochlorothiazide (HYZAAR) 100-25 MG tablet Take 1 tablet by mouth daily. (Patient taking differently: Take 1 tablet by mouth daily. TAKES 1/2 TABLET DAILY) 90 tablet 3  . Multiple Vitamin (MULTIVITAMIN) tablet Take 1 tablet by mouth daily.    . ondansetron (ZOFRAN) 8 MG tablet Take 1 tablet (8 mg total) by mouth 2 (two) times daily as needed (Nausea or vomiting). (Patient not taking: Reported on 08/24/2016) 30 tablet 1  . pomalidomide (POMALYST) 2 MG capsule Take 1 capsule (2 mg total) by mouth daily. Take with water on days 1-21. Repeat every 28 days. 21 capsule 11  . prochlorperazine (COMPAZINE) 10 MG tablet Take 1 tablet (10 mg total) by mouth every 6 (six) hours as needed (Nausea or vomiting). (Patient not taking: Reported on 06/22/2016) 30 tablet 1  . Sodium Fluoride 0.15 % PSTE Use as directed in the mouth or throat.    . traMADol (ULTRAM) 50 MG tablet TAKE 1 TABLET BY MOUTH EVERY 6 HOURS AS NEEDED FOR PAIN 90 tablet 0  . triamcinolone (NASACORT) 55 MCG/ACT AERO nasal inhaler Place 2 sprays into the nose daily. (Patient taking differently: Place 2 sprays daily as needed into the nose (allergies). Uses prn) 1 Inhaler 12   No current facility-administered medications for this visit.     PHYSICAL EXAMINATION: ECOG PERFORMANCE STATUS: 0 - Asymptomatic  Vitals:   06/22/17 0908   BP: 115/78  Pulse: 62  Resp: 18  Temp: 97.8 F (36.6 C)  SpO2: 100%   Filed Weights   06/22/17 0908  Weight: 273 lb 6.4 oz (124 kg)    GENERAL:alert, no distress and comfortable SKIN: skin color, texture, turgor are normal, no rashes or significant lesions EYES: normal, Conjunctiva are pink and non-injected, sclera clear OROPHARYNX:no exudate, no erythema and lips, buccal mucosa, and tongue normal  NECK: supple, thyroid normal size, non-tender, without nodularity LYMPH:  no palpable lymphadenopathy in the cervical, axillary or inguinal LUNGS: clear to auscultation and percussion with normal breathing effort HEART: regular rate & rhythm and no murmurs and no lower extremity edema ABDOMEN:abdomen soft, non-tender and normal bowel sounds Musculoskeletal:no cyanosis of digits and no clubbing  NEURO: alert & oriented x 3 with fluent speech, no focal motor/sensory deficits  LABORATORY DATA:  I have reviewed the data as listed    Component Value Date/Time   NA 139 06/22/2017 0845   NA 142 05/09/2017 0840   K 4.2 06/22/2017 0845   K 4.1 05/09/2017 0840   CL 101 06/22/2017 0845   CO2 30 (H) 06/22/2017 0845   CO2 28 05/09/2017 0840  GLUCOSE 86 06/22/2017 0845   GLUCOSE 61 (L) 05/09/2017 0840   BUN 19 06/22/2017 0845   BUN 14.4 05/09/2017 0840   CREATININE 0.87 06/22/2017 0845   CREATININE 0.9 05/09/2017 0840   CALCIUM 9.7 06/22/2017 0845   CALCIUM 9.3 05/09/2017 0840   PROT 6.7 06/22/2017 0845   PROT 6.1 05/09/2017 0840   PROT 6.3 (L) 05/09/2017 0840   ALBUMIN 3.4 (L) 06/22/2017 0845   ALBUMIN 3.4 (L) 05/09/2017 0840   AST 22 06/22/2017 0845   AST 20 05/09/2017 0840   ALT 20 06/22/2017 0845   ALT 21 05/09/2017 0840   ALKPHOS 98 06/22/2017 0845   ALKPHOS 81 05/09/2017 0840   BILITOT 0.5 06/22/2017 0845   BILITOT 0.74 05/09/2017 0840   GFRNONAA >60 06/22/2017 0845   GFRAA >60 06/22/2017 0845    No results found for: SPEP, UPEP  Lab Results  Component Value Date    WBC 3.4 (L) 06/22/2017   NEUTROABS 2.2 06/22/2017   HGB 11.9 06/22/2017   HCT 36.8 06/22/2017   MCV 90.7 06/22/2017   PLT 244 06/22/2017      Chemistry      Component Value Date/Time   NA 139 06/22/2017 0845   NA 142 05/09/2017 0840   K 4.2 06/22/2017 0845   K 4.1 05/09/2017 0840   CL 101 06/22/2017 0845   CO2 30 (H) 06/22/2017 0845   CO2 28 05/09/2017 0840   BUN 19 06/22/2017 0845   BUN 14.4 05/09/2017 0840   CREATININE 0.87 06/22/2017 0845   CREATININE 0.9 05/09/2017 0840      Component Value Date/Time   CALCIUM 9.7 06/22/2017 0845   CALCIUM 9.3 05/09/2017 0840   ALKPHOS 98 06/22/2017 0845   ALKPHOS 81 05/09/2017 0840   AST 22 06/22/2017 0845   AST 20 05/09/2017 0840   ALT 20 06/22/2017 0845   ALT 21 05/09/2017 0840   BILITOT 0.5 06/22/2017 0845   BILITOT 0.74 05/09/2017 0840     ASSESSMENT & PLAN:  Multiple myeloma with failed remission ([place]) I have reviewed her outside records She has achieved complete remission on recent bone marrow biopsy She is experiencing multiple other side effects of treatment Velcade was discontinued especially with worsening peripheral neuropathy I recommend reducing the dose of Pomalyst to 2 mg from her transplant physician starting next cycle She will continue acyclovir for antimicrobial prophylaxis She will Continue calcium with vitamin D, along with Zometa every 3 months, due today She is reminded to take aspirin for DVT prophylaxis  Chemotherapy induced neutropenia ([facility]) She has mild leukopenia but not symptomatic Plan to reduce dose of Pomalyst as above  Peripheral neuropathy due to chemotherapy [facility]) She felt that her peripheral neuropathy is worse, likely exacerbated by ongoing chemotherapy and the cold weather She will continue gabapentin 900 mg twice a day   No orders of the defined types were placed in this encounter.  All questions were answered. The patient knows to call the clinic with any problems, questions or  concerns. No barriers to learning was detected. I spent 15 minutes counseling the patient face to face. The total time spent in the appointment was 20 minutes and more than 50% was on counseling and review of test results     Heath Lark, MD 06/22/2017 9:35 AM

## 2017-06-22 NOTE — Assessment & Plan Note (Signed)
She felt that her peripheral neuropathy is worse, likely exacerbated by ongoing chemotherapy and the cold weather She will continue gabapentin 900 mg twice a day 

## 2017-06-22 NOTE — Telephone Encounter (Signed)
Gave patient AVs and calendar of upcoming April appointments.  °

## 2017-06-23 ENCOUNTER — Telehealth: Payer: Self-pay

## 2017-06-23 LAB — KAPPA/LAMBDA LIGHT CHAINS
Kappa free light chain: 21.1 mg/L — ABNORMAL HIGH (ref 3.3–19.4)
Kappa, lambda light chain ratio: 1.4 (ref 0.26–1.65)
Lambda free light chains: 15.1 mg/L (ref 5.7–26.3)

## 2017-06-23 NOTE — Telephone Encounter (Signed)
New authorization for Pomalyst obtained.  Script faxed to [facility] mail order.

## 2017-06-27 LAB — MULTIPLE MYELOMA PANEL, SERUM
Albumin SerPl Elph-Mcnc: 3.2 g/dL (ref 2.9–4.4)
Albumin/Glob SerPl: 1.2 (ref 0.7–1.7)
Alpha 1: 0.2 g/dL (ref 0.0–0.4)
Alpha2 Glob SerPl Elph-Mcnc: 0.9 g/dL (ref 0.4–1.0)
B-Globulin SerPl Elph-Mcnc: 1 g/dL (ref 0.7–1.3)
Gamma Glob SerPl Elph-Mcnc: 0.7 g/dL (ref 0.4–1.8)
Globulin, Total: 2.9 g/dL (ref 2.2–3.9)
IgA: 62 mg/dL — ABNORMAL LOW (ref 87–352)
IgG (Immunoglobin G), Serum: 782 mg/dL (ref 700–1600)
IgM (Immunoglobulin M), Srm: 19 mg/dL — ABNORMAL LOW (ref 26–217)
Total Protein ELP: 6.1 g/dL (ref 6.0–8.5)

## 2017-07-20 ENCOUNTER — Encounter: Payer: Self-pay | Admitting: Hematology and Oncology

## 2017-07-21 ENCOUNTER — Other Ambulatory Visit: Payer: Self-pay | Admitting: Hematology and Oncology

## 2017-07-21 ENCOUNTER — Telehealth: Payer: Self-pay

## 2017-07-21 ENCOUNTER — Other Ambulatory Visit: Payer: Self-pay | Admitting: Pharmacist

## 2017-07-21 NOTE — Telephone Encounter (Signed)
Called to see if she can come in on 3/25 for immunizations, she is agreeable. Scheduling message sent for 3/25 at 2 pm.

## 2017-07-22 ENCOUNTER — Other Ambulatory Visit: Payer: Self-pay

## 2017-07-22 ENCOUNTER — Telehealth: Payer: Self-pay | Admitting: Hematology and Oncology

## 2017-07-22 MED ORDER — POMALIDOMIDE 2 MG PO CAPS: ORAL_CAPSULE | ORAL | 11 refills | Status: DC

## 2017-07-22 NOTE — Telephone Encounter (Signed)
Patient scheduled per 3/7 sch message. Patient aware of date and time.

## 2017-07-26 ENCOUNTER — Other Ambulatory Visit: Payer: Self-pay | Admitting: *Deleted

## 2017-07-26 MED ORDER — POMALIDOMIDE 2 MG PO CAPS: ORAL_CAPSULE | ORAL | 11 refills | Status: DC

## 2017-07-29 ENCOUNTER — Encounter: Payer: Self-pay | Admitting: Hematology and Oncology

## 2017-08-05 ENCOUNTER — Other Ambulatory Visit: Payer: Self-pay | Admitting: Hematology and Oncology

## 2017-08-08 ENCOUNTER — Inpatient Hospital Stay: Payer: Medicare Other | Attending: Hematology and Oncology

## 2017-08-08 DIAGNOSIS — C9000 Multiple myeloma not having achieved remission: Secondary | ICD-10-CM

## 2017-08-08 DIAGNOSIS — Z23 Encounter for immunization: Secondary | ICD-10-CM | POA: Insufficient documentation

## 2017-08-08 MED ORDER — PNEUMOCOCCAL 13-VAL CONJ VACC IM SUSP
0.5000 mL | Freq: Once | INTRAMUSCULAR | Status: AC
  Administered 2017-08-08: 0.5 mL via INTRAMUSCULAR
  Filled 2017-08-08: qty 0.5

## 2017-08-08 MED ORDER — DTAP-IPV VACCINE IM SUSP
0.5000 mL | Freq: Once | INTRAMUSCULAR | Status: AC
  Administered 2017-08-08: 0.5 mL via INTRAMUSCULAR
  Filled 2017-08-08: qty 0.5

## 2017-08-08 MED ORDER — HAEMOPHILUS B POLYSAC CONJ VAC IM SOLR
0.5000 mL | Freq: Once | INTRAMUSCULAR | Status: AC
  Administered 2017-08-08: 0.5 mL via INTRAMUSCULAR
  Filled 2017-08-08: qty 0.5

## 2017-08-08 NOTE — Patient Instructions (Signed)
Pneumococcal Conjugate Vaccine suspension for injection What is this medicine? PNEUMOCOCCAL VACCINE (NEU mo KOK al vak SEEN) is a vaccine used to prevent pneumococcus bacterial infections. These bacteria can cause serious infections like pneumonia, meningitis, and blood infections. This vaccine will lower your chance of getting pneumonia. If you do get pneumonia, it can make your symptoms milder and your illness shorter. This vaccine will not treat an infection and will not cause infection. This vaccine is recommended for infants and young children, adults with certain medical conditions, and adults 63 years or older. This medicine may be used for other purposes; ask your health care provider or pharmacist if you have questions. COMMON BRAND NAME(S): Prevnar, Prevnar 13 What should I tell my health care provider before I take this medicine? They need to know if you have any of these conditions: -bleeding problems -fever -immune system problems -an unusual or allergic reaction to pneumococcal vaccine, diphtheria toxoid, other vaccines, latex, other medicines, foods, dyes, or preservatives -pregnant or trying to get pregnant -breast-feeding How should I use this medicine? This vaccine is for injection into a muscle. It is given by a health care professional. A copy of Vaccine Information Statements will be given before each vaccination. Read this sheet carefully each time. The sheet may change frequently. Talk to your pediatrician regarding the use of this medicine in children. While this drug may be prescribed for children as young as 69 weeks old for selected conditions, precautions do apply. Overdosage: If you think you have taken too much of this medicine contact a poison control center or emergency room at once. NOTE: This medicine is only for you. Do not share this medicine with others. What if I miss a dose? It is important not to miss your dose. Call your doctor or health care professional  if you are unable to keep an appointment. What may interact with this medicine? -medicines for cancer chemotherapy -medicines that suppress your immune function -steroid medicines like prednisone or cortisone This list may not describe all possible interactions. Give your health care provider a list of all the medicines, herbs, non-prescription drugs, or dietary supplements you use. Also tell them if you smoke, drink alcohol, or use illegal drugs. Some items may interact with your medicine. What should I watch for while using this medicine? Mild fever and pain should go away in 3 days or less. Report any unusual symptoms to your doctor or health care professional. What side effects may I notice from receiving this medicine? Side effects that you should report to your doctor or health care professional as soon as possible: -allergic reactions like skin rash, itching or hives, swelling of the face, lips, or tongue -breathing problems -confused -fast or irregular heartbeat -fever over 102 degrees F -seizures -unusual bleeding or bruising -unusual muscle weakness Side effects that usually do not require medical attention (report to your doctor or health care professional if they continue or are bothersome): -aches and pains -diarrhea -fever of 102 degrees F or less -headache -irritable -loss of appetite -pain, tender at site where injected -trouble sleeping This list may not describe all possible side effects. Call your doctor for medical advice about side effects. You may report side effects to FDA at 1-800-FDA-1088. Where should I keep my medicine? This does not apply. This vaccine is given in a clinic, pharmacy, doctor's office, or other health care setting and will not be stored at home. NOTE: This sheet is a summary. It may not cover all possible information.  If you have questions about this medicine, talk to your doctor, pharmacist, or health care provider.  2018 Elsevier/Gold  Standard (2014-02-07 10:27:27) Haemophilus influenzae type b Conjugate Vaccine injection What is this medicine? HAEMOPHILUS INFLUENZAE TYPE B CONJUGATE VACCINE (hem OFF fil [place] in floo En zuh type B KAN ji get VAK seen) is used to prevent infections of a Haemophilus bacteria. This medicine may be used for other purposes; ask your health care provider or pharmacist if you have questions. COMMON BRAND NAME(S): ActHIB, Hiberix, HibTITER, PedvaxHIB What should I tell my health care provider before I take this medicine? They need to know if you have any of these conditions: -bleeding disorder -Guillain-Barre syndrome -immune system problems -infection with fever -low levels of platelets in the blood -take medicines that treat or prevent blood clots -an unusual or allergic reaction to vaccines, other medicines, foods, dyes, or preservatives -pregnant or trying to get pregnant -breast-feeding How should I use this medicine? This vaccine is for injection into a muscle. It is given by a health care professional. A copy of Vaccine Information Statements will be given before each vaccination. Read this sheet carefully each time. The sheet may change frequently. Talk to your pediatrician regarding the use of this medicine in children. While this drug may be prescribed for children as young as 61 months old for selected conditions, precautions do apply. Overdosage: If you think you have taken too much of this medicine contact a poison control center or emergency room at once. NOTE: This medicine is only for you. Do not share this medicine with others. What if I miss a dose? Keep appointments for follow-up (booster) doses as directed. It is important not to miss your dose. Call your doctor or health care professional if you are unable to keep an appointment. What may interact with this medicine? -adalimumab -anakinra -infliximab -medicines that suppress your immune system -medicines that treat or  prevent blood clots like warfarin, enoxaparin, and dalteparin -medicines to treat cancer This list may not describe all possible interactions. Give your health care provider a list of all the medicines, herbs, non-prescription drugs, or dietary supplements you use. Also tell them if you smoke, drink alcohol, or use illegal drugs. Some items may interact with your medicine. What should I watch for while using this medicine? Visit your doctor for regular check-ups as directed. This vaccine, like all vaccines, may not fully protect everyone. What side effects may I notice from receiving this medicine? Side effects that you should report to your doctor or health care professional as soon as possible: -allergic reactions like skin rash, itching or hives, swelling of the face, lips, or tongue -breathing problems -extreme changes in behavior -fever over 100 degrees F -pain, tingling, numbness in the hands or feet -seizures -unusually weak or tired Side effects that usually do not require medical attention (report to your doctor or health care professional if they continue or are bothersome): -aches or pains -bruising, pain, swelling at site where injected -diarrhea -headache -loss of appetite -low-grade fever of 100 degrees F or less -nausea, vomiting -sleepy This list may not describe all possible side effects. Call your doctor for medical advice about side effects. You may report side effects to FDA at 1-800-FDA-1088. Where should I keep my medicine? This drug is given in a hospital or clinic and will not be stored at home. NOTE: This sheet is a summary. It may not cover all possible information. If you have questions about this medicine, talk to  your doctor, pharmacist, or health care provider.  2018 Elsevier/Gold Standard (2013-09-03 13:43:01) Diphtheria, Tetanus, Acellular Pertussis, Poliovirus Vaccine What is this medicine? DIPHTHERIA TOXOID, TETANUS TOXOID, ACELLULAR PERTUSSIS  VACCINE, DTaP; INACTIVATED POLIOVIRUS VACCINE, IPV (dif THEER ee uh TOK soid, TET n [place] TOK soid, ey SEL yuh ler per TUS iss vak SEEN, DTaP; in ak tuh vey ted poh lee oh vahy ruhs vak SEEN, IPV ) is used to help prevent diphtheria, tetanus, pertussis, and polio infections. This medicine may be used for other purposes; ask your health care provider or pharmacist if you have questions. COMMON BRAND NAME(S): Kinrix, Quadracel What should I tell my health care provider before I take this medicine? They need to know if you have any of these conditions: -blood disorders like hemophilia -fever or infection -immune system problems -neurologic disease -seizures -take medicines that treat or prevent blood clots -an unusual or allergic reaction to Diphtheria Toxoid, Tetanus Toxoid, Acellular Pertussis Vaccine, DTaP; Inactivated Poliovirus Vaccine, IPV, other medicines, neomycin, latex, polymyxin b, polysorbate 80, foods, dyes, or preservatives -pregnant or trying to get pregnant -breast-feeding How should I use this medicine? This vaccine is for injection into a muscle. It is given by a health care professional. A copy of Vaccine Information Statements will be given before each vaccination. Read this sheet carefully each time. The sheet may change frequently. Talk to your pediatrician regarding the use of this medicine in children. While this drug may be prescribed for children as young as 4 years for selected conditions, precautions do apply. Overdosage: If you think you have taken too much of this medicine contact a poison control center or emergency room at once. NOTE: This medicine is only for you. Do not share this medicine with others. What if I miss a dose? It is important not to miss your dose. Call your doctor or health care professional if you are unable to keep an appointment. What may interact with this medicine? -medicines that suppress your immune function like adalimumab, anakinra,  infliximab -medicines to treat cancer -steroid medicines like prednisone or cortisone This list may not describe all possible interactions. Give your health care provider a list of all the medicines, herbs, non-prescription drugs, or dietary supplements you use. Also tell them if you smoke, drink alcohol, or use illegal drugs. Some items may interact with your medicine. What should I watch for while using this medicine? Contact your doctor or health care professional and get emergency medical care if any serious side effects occur. This vaccine, like all vaccines, may not fully protect everyone. What side effects may I notice from receiving this medicine? Side effects that you should report to your doctor or health care professional as soon as possible: -allergic reactions like skin rash, itching or hives, swelling of the face, lips, or tongue -breathing problems -fever over 103 degrees F -inconsolable crying for 3 hours or more -seizures -unusually weak or tired Side effects that usually do not require medical attention (report to your doctor or health care professional if they continue or are bothersome): -bruising, pain, swelling at site where injected -fussy -loss of appetite -low-grade fever -sleepy -vomiting This list may not describe all possible side effects. Call your doctor for medical advice about side effects. You may report side effects to FDA at 1-800-FDA-1088. Where should I keep my medicine? This vaccine is only given in a clinic, pharmacy, doctor's office, or other health care setting and will not be stored at home. NOTE: This sheet is a summary.  It may not cover all possible information. If you have questions about this medicine, talk to your doctor, pharmacist, or health care provider.  2018 Elsevier/Gold Standard (2014-06-14 12:28:34)

## 2017-08-17 ENCOUNTER — Inpatient Hospital Stay: Payer: Medicare Other | Admitting: Hematology and Oncology

## 2017-08-17 ENCOUNTER — Inpatient Hospital Stay: Payer: Medicare Other

## 2017-08-17 ENCOUNTER — Inpatient Hospital Stay: Payer: Medicare Other | Attending: Hematology and Oncology

## 2017-08-17 ENCOUNTER — Telehealth: Payer: Self-pay | Admitting: Hematology and Oncology

## 2017-08-17 DIAGNOSIS — C9000 Multiple myeloma not having achieved remission: Secondary | ICD-10-CM | POA: Diagnosis not present

## 2017-08-17 DIAGNOSIS — D701 Agranulocytosis secondary to cancer chemotherapy: Secondary | ICD-10-CM

## 2017-08-17 DIAGNOSIS — T451X5A Adverse effect of antineoplastic and immunosuppressive drugs, initial encounter: Secondary | ICD-10-CM

## 2017-08-17 DIAGNOSIS — G620 Drug-induced polyneuropathy: Secondary | ICD-10-CM

## 2017-08-17 DIAGNOSIS — Z6841 Body Mass Index (BMI) 40.0 and over, adult: Secondary | ICD-10-CM | POA: Diagnosis not present

## 2017-08-17 LAB — CBC WITH DIFFERENTIAL/PLATELET
Basophils Absolute: 0.1 10*3/uL (ref 0.0–0.1)
Basophils Relative: 2 %
Eosinophils Absolute: 0.4 10*3/uL (ref 0.0–0.5)
Eosinophils Relative: 14 %
HCT: 41.4 % (ref 34.8–46.6)
Hemoglobin: 13.1 g/dL (ref 11.6–15.9)
Lymphocytes Relative: 22 %
Lymphs Abs: 0.6 10*3/uL — ABNORMAL LOW (ref 0.9–3.3)
MCH: 28.4 pg (ref 25.1–34.0)
MCHC: 31.6 g/dL (ref 31.5–36.0)
MCV: 89.6 fL (ref 79.5–101.0)
Monocytes Absolute: 0.2 10*3/uL (ref 0.1–0.9)
Monocytes Relative: 7 %
Neutro Abs: 1.6 10*3/uL (ref 1.5–6.5)
Neutrophils Relative %: 55 %
Platelets: 245 10*3/uL (ref 145–400)
RBC: 4.62 MIL/uL (ref 3.70–5.45)
RDW: 16.8 % — ABNORMAL HIGH (ref 11.2–14.5)
WBC: 2.9 10*3/uL — ABNORMAL LOW (ref 3.9–10.3)

## 2017-08-17 LAB — COMPREHENSIVE METABOLIC PANEL
ALT: 16 U/L (ref 0–55)
AST: 20 U/L (ref 5–34)
Albumin: 3.4 g/dL — ABNORMAL LOW (ref 3.5–5.0)
Alkaline Phosphatase: 87 U/L (ref 40–150)
Anion gap: 10 (ref 3–11)
BUN: 12 mg/dL (ref 7–26)
CO2: 28 mmol/L (ref 22–29)
Calcium: 10.2 mg/dL (ref 8.4–10.4)
Chloride: 104 mmol/L (ref 98–109)
Creatinine, Ser: 0.91 mg/dL (ref 0.60–1.10)
GFR calc Af Amer: >60 mL/min (ref >60)
GFR calc non Af Amer: >60 mL/min (ref >60)
Glucose, Bld: 71 mg/dL (ref 70–140)
Potassium: 3.6 mmol/L (ref 3.5–5.1)
Sodium: 142 mmol/L (ref 136–145)
Total Bilirubin: 0.5 mg/dL (ref 0.2–1.2)
Total Protein: 6.9 g/dL (ref 6.4–8.3)

## 2017-08-17 MED ORDER — GABAPENTIN 300 MG PO CAPS: 900.0000 mg | ORAL_CAPSULE | Freq: Two times a day (BID) | ORAL | 3 refills | Status: DC

## 2017-08-17 MED ORDER — ZOLEDRONIC ACID 4 MG/100ML IV SOLN
4.0000 mg | Freq: Once | INTRAVENOUS | Status: AC
  Administered 2017-08-17: 4 mg via INTRAVENOUS
  Filled 2017-08-17: qty 100

## 2017-08-17 NOTE — Patient Instructions (Signed)
[facility] Discharge Instructions for Patients Receiving Chemotherapy  Today you received the following chemotherapy agents Zometa  To help prevent nausea and vomiting after your treatment, we encourage you to take your nausea medication as directed  If you develop nausea and vomiting that is not controlled by your nausea medication, call the clinic.   BELOW ARE SYMPTOMS THAT SHOULD BE REPORTED IMMEDIATELY:  *FEVER GREATER THAN 100.5 F  *CHILLS WITH OR WITHOUT FEVER  NAUSEA AND VOMITING THAT IS NOT CONTROLLED WITH YOUR NAUSEA MEDICATION  *UNUSUAL SHORTNESS OF BREATH  *UNUSUAL BRUISING OR BLEEDING  TENDERNESS IN MOUTH AND THROAT WITH OR WITHOUT PRESENCE OF ULCERS  *URINARY PROBLEMS  *BOWEL PROBLEMS  UNUSUAL RASH Items with * indicate a potential emergency and should be followed up as soon as possible.  Feel free to call the clinic should you have any questions or concerns. The clinic phone number is [phone].  Please show the CHEMO ALERT CARD at check-in to the Emergency Department and triage nurse.   

## 2017-08-17 NOTE — Telephone Encounter (Signed)
Gave patient AVS and calendar of upcoming July appointments.  °

## 2017-08-18 ENCOUNTER — Encounter: Payer: Self-pay | Admitting: Hematology and Oncology

## 2017-08-18 DIAGNOSIS — Z6839 Body mass index (BMI) 39.0-39.9, adult: Secondary | ICD-10-CM | POA: Insufficient documentation

## 2017-08-18 DIAGNOSIS — Z6841 Body Mass Index (BMI) 40.0 and over, adult: Secondary | ICD-10-CM

## 2017-08-18 DIAGNOSIS — E66811 Obesity, class 1: Secondary | ICD-10-CM | POA: Insufficient documentation

## 2017-08-18 DIAGNOSIS — E6609 Other obesity due to excess calories: Secondary | ICD-10-CM | POA: Insufficient documentation

## 2017-08-18 LAB — KAPPA/LAMBDA LIGHT CHAINS
Kappa free light chain: 30.7 mg/L — ABNORMAL HIGH (ref 3.3–19.4)
Kappa, lambda light chain ratio: 1.39 (ref 0.26–1.65)
Lambda free light chains: 22.1 mg/L (ref 5.7–26.3)

## 2017-08-18 NOTE — Assessment & Plan Note (Signed)
This is likely due to recent treatment. The patient denies recent history of fevers, cough, chills, diarrhea or dysuria. She is asymptomatic from the leukopenia. I will observe for now.  I will continue the chemotherapy at current dose without dosage adjustment.  If the leukopenia gets progressive worse in the future, I might have to delay her treatment or adjust the chemotherapy dose.   

## 2017-08-18 NOTE — Assessment & Plan Note (Signed)
She has mild leukopenia due to side effects of treatment We will continue reduced dose of Pomalyst at 2 mg, to be taken on days 1-21, rest 7 days She will continue acyclovir for antimicrobial prophylaxis She will Continue calcium with vitamin D, along with Zometa every 3 months, due today She is reminded to take aspirin for DVT prophylaxis

## 2017-08-18 NOTE — Progress Notes (Signed)
[place] OFFICE PROGRESS NOTE  Patient Care Team: Lucianne Lei, MD as PCP - General (Family Medicine)  ASSESSMENT & PLAN:  Multiple myeloma with failed remission [facility]) She has mild leukopenia due to side effects of treatment We will continue reduced dose of Pomalyst at 2 mg, to be taken on days 1-21, rest 7 days She will continue acyclovir for antimicrobial prophylaxis She will Continue calcium with vitamin D, along with Zometa every 3 months, due today She is reminded to take aspirin for DVT prophylaxis  Chemotherapy induced neutropenia ([place]) This is likely due to recent treatment. The patient denies recent history of fevers, cough, chills, diarrhea or dysuria. She is asymptomatic from the leukopenia. I will observe for now.  I will continue the chemotherapy at current dose without dosage adjustment.  If the leukopenia gets progressive worse in the future, I might have to delay her treatment or adjust the chemotherapy dose.    Peripheral neuropathy due to chemotherapy [facility]) She felt that her peripheral neuropathy is worse, likely exacerbated by ongoing chemotherapy and the cold weather She will continue gabapentin 900 mg twice a day  Morbid obesity with BMI of 40.0-44.9, adult ([place]) We discussed dietary modification and weight loss strategy The patient has some chronic knee pain likely due to morbid obesity She appears motivated to lose some weight   No orders of the defined types were placed in this encounter.   INTERVAL HISTORY: Please see below for problem oriented charting. She returns for further follow-up Denies recent infection Peripheral neuropathy stable No new bone pain Denies recent dental issue.  She had seen a dentist recently She is compliant taking all her medications as directed She has mild bilateral knee pain  SUMMARY OF ONCOLOGIC HISTORY:   Multiple myeloma with failed remission ([place])   03/22/2016 Bone Marrow Biopsy    Bone Marrow  Biopsy: Plasma cells 17% by Aspirate and 30% by CD 138 stain. Plasma cell neoplasm, Kappa Restricted Normal cytogenetics, FISH positive for +11 and +14 and +7      04/13/2016 Imaging    Skeletal survey showed lucencies within the calvarium worrisome for myeloma. No definite abnormal lytic or blastic lesions are observed elsewhere.      04/19/2016 - 09/10/2016 Chemotherapy    The patient consented to treatment with Revlimid, dexamethasone and Velcade      09/14/2016 Bone Marrow Biopsy    In summary, there is a normal cellular marrow with trilineage hematopoiesis.A mild plasmacytosis is present, but there is no definitive evidence of residual multiple myeloma      10/12/2016 - 10/12/2016 Chemotherapy    She received melphalan as conditioning chemo      10/13/2016 Bone Marrow Transplant    She received autologous stem cell transplant at [facility]      01/24/2017 Bone Marrow Biopsy    BONE MARROW: -Normocellular marrow for age (50%) with trilineage hematopoiesis. -No morphologic or immunohistochemical evidence of involvement by plasma cell neoplasm (see comment)  PERIPHERAL BLOOD: -Unremarkable (see CBC data)  COMMENT: The bone marrow is normocellular for age and shows adequate trilineage hematopoiesis without significant (<10%) dysplastic changes present. Blasts are not increased. Plasma cells represent about 2% of total cells on aspirate smears. Appropriately controlled immunohistochemical stains are performed on the core biopsy. CD138 highlights small interstitial plasma cells comprising approximately 2% of total cells. These plasma cells appear polytypic as demonstrated by kappa and lambda in-situ hybridization.      01/25/2017 PET scan    No  FDG avid osseous lesions or masses are identified.      02/18/2017 - 04/01/2017 Chemotherapy    She received weekly Velcade, Pomalyst and Dexamethasone      04/15/2017 -  Chemotherapy    The patient had Pomalyst  only      04/20/2017 Bone Marrow Biopsy    Bone Marrow (BM) and Peripheral Blood (PB) FINAL PATHOLOGIC DIAGNOSIS BONE MARROW: Normocellular bone marrow (40%) with normal numbers of megakaryocytes, erythroid hyperplasia and no increase in plasma cells (1%).       REVIEW OF SYSTEMS:   Constitutional: Denies fevers, chills or abnormal weight loss Eyes: Denies blurriness of vision Ears, nose, mouth, throat, and face: Denies mucositis or sore throat Respiratory: Denies cough, dyspnea or wheezes Cardiovascular: Denies palpitation, chest discomfort or lower extremity swelling Gastrointestinal:  Denies nausea, heartburn or change in bowel habits Skin: Denies abnormal skin rashes Lymphatics: Denies new lymphadenopathy or easy bruising Neurological:Denies numbness, tingling or new weaknesses Behavioral/Psych: Mood is stable, no new changes  All other systems were reviewed with the patient and are negative.  I have reviewed the past medical history, past surgical history, social history and family history with the patient and they are unchanged from previous note.  ALLERGIES:  is allergic to revlimid [lenalidomide].  MEDICATIONS:  Current Outpatient Medications  Medication Sig Dispense Refill  . acyclovir (ZOVIRAX) 400 MG tablet Take 1 tablet (400 mg total) by mouth 2 (two) times daily. 90 tablet 9  . ASPIRIN 81 PO Take 81 mg by mouth daily.    . bisacodyl (BISACODYL) 5 MG EC tablet Take 5 mg by mouth daily as needed for moderate constipation.    . Calcium Carbonate-Vitamin D (CALCIUM PLUS VITAMIN D PO) Take 1 tablet daily by mouth.    . calcium-vitamin D (OSCAL WITH D) 500-200 MG-UNIT tablet Take 2 tablets by mouth daily.    . Chlorphen-PE-Acetaminophen 4-10-325 MG TABS Take 1 tablet by mouth every 6 (six) hours as needed for allergies.    . folic acid (FOLVITE) 1 MG tablet Take 1 mg by mouth daily.    Marland Kitchen gabapentin (NEURONTIN) 300 MG capsule TAKE 3 CAPSULES BY MOUTH TWICE DAILY 90 capsule  3  . gabapentin (NEURONTIN) 300 MG capsule Take 3 capsules (900 mg total) by mouth 2 (two) times daily. 540 capsule 3  . losartan-hydrochlorothiazide (HYZAAR) 100-25 MG tablet Take 1 tablet by mouth daily. (Patient taking differently: Take 1 tablet by mouth daily. TAKES 1/2 TABLET DAILY) 90 tablet 3  . Multiple Vitamin (MULTIVITAMIN) tablet Take 1 tablet by mouth daily.    . ondansetron (ZOFRAN) 8 MG tablet Take 1 tablet (8 mg total) by mouth 2 (two) times daily as needed (Nausea or vomiting). (Patient not taking: Reported on 08/24/2016) 30 tablet 1  . pomalidomide (POMALYST) 2 MG capsule TAKE 1 CAPSULE BY MOUTH ONCE DAILY FOR 21 DAYS ON AND 7 DAYS OFF 21 capsule 11  . prochlorperazine (COMPAZINE) 10 MG tablet Take 1 tablet (10 mg total) by mouth every 6 (six) hours as needed (Nausea or vomiting). (Patient not taking: Reported on 06/22/2016) 30 tablet 1  . Sodium Fluoride 0.15 % PSTE Use as directed in the mouth or throat.    . traMADol (ULTRAM) 50 MG tablet TAKE 1 TABLET BY MOUTH EVERY 6 HOURS AS NEEDED FOR PAIN 90 tablet 0  . triamcinolone (NASACORT) 55 MCG/ACT AERO nasal inhaler Place 2 sprays into the nose daily. (Patient taking differently: Place 2 sprays daily as needed into the nose (allergies).  Uses prn) 1 Inhaler 12   No current facility-administered medications for this visit.     PHYSICAL EXAMINATION: ECOG PERFORMANCE STATUS: 1 - Symptomatic but completely ambulatory  Vitals:   08/17/17 0856  BP: (!) 143/61  Pulse: 70  Resp: 18  Temp: 97.9 F (36.6 C)  SpO2: 100%   Filed Weights   08/17/17 0856  Weight: 276 lb 4.8 oz (125.3 kg)    GENERAL:alert, no distress and comfortable SKIN: skin color, texture, turgor are normal, no rashes or significant lesions EYES: normal, Conjunctiva are pink and non-injected, sclera clear OROPHARYNX:no exudate, no erythema and lips, buccal mucosa, and tongue normal  NECK: supple, thyroid normal size, non-tender, without nodularity LYMPH:  no  palpable lymphadenopathy in the cervical, axillary or inguinal LUNGS: clear to auscultation and percussion with normal breathing effort HEART: regular rate & rhythm and no murmurs and no lower extremity edema ABDOMEN:abdomen soft, non-tender and normal bowel sounds Musculoskeletal:no cyanosis of digits and no clubbing  NEURO: alert & oriented x 3 with fluent speech, no focal motor/sensory deficits  LABORATORY DATA:  I have reviewed the data as listed    Component Value Date/Time   NA 142 08/17/2017 0816   NA 142 05/09/2017 0840   K 3.6 08/17/2017 0816   K 4.1 05/09/2017 0840   CL 104 08/17/2017 0816   CO2 28 08/17/2017 0816   CO2 28 05/09/2017 0840   GLUCOSE 71 08/17/2017 0816   GLUCOSE 61 (L) 05/09/2017 0840   BUN 12 08/17/2017 0816   BUN 14.4 05/09/2017 0840   CREATININE 0.91 08/17/2017 0816   CREATININE 0.9 05/09/2017 0840   CALCIUM 10.2 08/17/2017 0816   CALCIUM 9.3 05/09/2017 0840   PROT 6.9 08/17/2017 0816   PROT 6.1 05/09/2017 0840   PROT 6.3 (L) 05/09/2017 0840   ALBUMIN 3.4 (L) 08/17/2017 0816   ALBUMIN 3.4 (L) 05/09/2017 0840   AST 20 08/17/2017 0816   AST 20 05/09/2017 0840   ALT 16 08/17/2017 0816   ALT 21 05/09/2017 0840   ALKPHOS 87 08/17/2017 0816   ALKPHOS 81 05/09/2017 0840   BILITOT 0.5 08/17/2017 0816   BILITOT 0.74 05/09/2017 0840   GFRNONAA >60 08/17/2017 0816   GFRAA >60 08/17/2017 0816    No results found for: SPEP, UPEP  Lab Results  Component Value Date   WBC 2.9 (L) 08/17/2017   NEUTROABS 1.6 08/17/2017   HGB 13.1 08/17/2017   HCT 41.4 08/17/2017   MCV 89.6 08/17/2017   PLT 245 08/17/2017      Chemistry      Component Value Date/Time   NA 142 08/17/2017 0816   NA 142 05/09/2017 0840   K 3.6 08/17/2017 0816   K 4.1 05/09/2017 0840   CL 104 08/17/2017 0816   CO2 28 08/17/2017 0816   CO2 28 05/09/2017 0840   BUN 12 08/17/2017 0816   BUN 14.4 05/09/2017 0840   CREATININE 0.91 08/17/2017 0816   CREATININE 0.9 05/09/2017 0840       Component Value Date/Time   CALCIUM 10.2 08/17/2017 0816   CALCIUM 9.3 05/09/2017 0840   ALKPHOS 87 08/17/2017 0816   ALKPHOS 81 05/09/2017 0840   AST 20 08/17/2017 0816   AST 20 05/09/2017 0840   ALT 16 08/17/2017 0816   ALT 21 05/09/2017 0840   BILITOT 0.5 08/17/2017 0816   BILITOT 0.74 05/09/2017 0840     All questions were answered. The patient knows to call the clinic with any problems, questions or concerns.  No barriers to learning was detected.  I spent 15 minutes counseling the patient face to face. The total time spent in the appointment was 20 minutes and more than 50% was on counseling and review of test results  Heath Lark, MD 08/18/2017 7:53 AM

## 2017-08-18 NOTE — Assessment & Plan Note (Signed)
She felt that her peripheral neuropathy is worse, likely exacerbated by ongoing chemotherapy and the cold weather She will continue gabapentin 900 mg twice a day 

## 2017-08-18 NOTE — Assessment & Plan Note (Signed)
We discussed dietary modification and weight loss strategy The patient has some chronic knee pain likely due to morbid obesity She appears motivated to lose some weight

## 2017-08-22 ENCOUNTER — Other Ambulatory Visit: Payer: Self-pay | Admitting: Hematology and Oncology

## 2017-08-22 LAB — MULTIPLE MYELOMA PANEL, SERUM
Albumin SerPl Elph-Mcnc: 3.2 g/dL (ref 2.9–4.4)
Albumin/Glob SerPl: 1.1 (ref 0.7–1.7)
Alpha 1: 0.2 g/dL (ref 0.0–0.4)
Alpha2 Glob SerPl Elph-Mcnc: 0.9 g/dL (ref 0.4–1.0)
B-Globulin SerPl Elph-Mcnc: 1.1 g/dL (ref 0.7–1.3)
Gamma Glob SerPl Elph-Mcnc: 0.8 g/dL (ref 0.4–1.8)
Globulin, Total: 3.1 g/dL (ref 2.2–3.9)
IgA: 101 mg/dL (ref 87–352)
IgG (Immunoglobin G), Serum: 900 mg/dL (ref 700–1600)
IgM (Immunoglobulin M), Srm: 26 mg/dL (ref 26–217)
Total Protein ELP: 6.3 g/dL (ref 6.0–8.5)

## 2017-08-24 ENCOUNTER — Telehealth: Payer: Self-pay | Admitting: *Deleted

## 2017-08-24 ENCOUNTER — Other Ambulatory Visit: Payer: Self-pay | Admitting: *Deleted

## 2017-08-24 MED ORDER — POMALIDOMIDE 2 MG PO CAPS: ORAL_CAPSULE | ORAL | 11 refills | Status: DC

## 2017-08-24 NOTE — Telephone Encounter (Signed)
LM with note below. Requested a call back to let us know how she is doing.

## 2017-08-24 NOTE — Telephone Encounter (Signed)
-----   Message from Heath Lark, MD sent at 08/24/2017  7:31 AM EDT ----- Regarding: is she doing OK She had knee pain, neuropathy and recent low WBC. Can you call and check on her?

## 2017-09-14 ENCOUNTER — Encounter: Payer: Self-pay | Admitting: Hematology and Oncology

## 2017-09-20 ENCOUNTER — Other Ambulatory Visit: Payer: Self-pay | Admitting: Hematology and Oncology

## 2017-09-22 ENCOUNTER — Other Ambulatory Visit: Payer: Self-pay

## 2017-09-22 MED ORDER — POMALIDOMIDE 2 MG PO CAPS: 2.0000 mg | ORAL_CAPSULE | Freq: Every day | ORAL | 11 refills | Status: DC

## 2017-10-17 ENCOUNTER — Other Ambulatory Visit: Payer: Self-pay | Admitting: Hematology and Oncology

## 2017-10-18 ENCOUNTER — Other Ambulatory Visit: Payer: Self-pay | Admitting: *Deleted

## 2017-10-18 MED ORDER — POMALIDOMIDE 2 MG PO CAPS: ORAL_CAPSULE | ORAL | 11 refills | Status: DC

## 2017-10-26 ENCOUNTER — Encounter: Payer: Self-pay | Admitting: Hematology and Oncology

## 2017-11-10 ENCOUNTER — Other Ambulatory Visit: Payer: Self-pay

## 2017-11-10 MED ORDER — POMALIDOMIDE 2 MG PO CAPS: ORAL_CAPSULE | ORAL | 11 refills | Status: DC

## 2017-11-14 ENCOUNTER — Encounter: Payer: Self-pay | Admitting: Hematology and Oncology

## 2017-11-15 ENCOUNTER — Telehealth: Payer: Self-pay | Admitting: Hematology and Oncology

## 2017-11-15 ENCOUNTER — Inpatient Hospital Stay: Payer: Medicare Other | Attending: Hematology and Oncology

## 2017-11-15 ENCOUNTER — Inpatient Hospital Stay: Payer: Medicare Other | Admitting: Hematology and Oncology

## 2017-11-15 ENCOUNTER — Inpatient Hospital Stay: Payer: Medicare Other

## 2017-11-15 ENCOUNTER — Encounter: Payer: Self-pay | Admitting: Hematology and Oncology

## 2017-11-15 DIAGNOSIS — Z6841 Body Mass Index (BMI) 40.0 and over, adult: Secondary | ICD-10-CM

## 2017-11-15 DIAGNOSIS — G620 Drug-induced polyneuropathy: Secondary | ICD-10-CM | POA: Diagnosis not present

## 2017-11-15 DIAGNOSIS — T451X5A Adverse effect of antineoplastic and immunosuppressive drugs, initial encounter: Secondary | ICD-10-CM

## 2017-11-15 DIAGNOSIS — C9000 Multiple myeloma not having achieved remission: Secondary | ICD-10-CM | POA: Insufficient documentation

## 2017-11-15 DIAGNOSIS — R430 Anosmia: Secondary | ICD-10-CM | POA: Diagnosis not present

## 2017-11-15 DIAGNOSIS — Z9484 Stem cells transplant status: Secondary | ICD-10-CM

## 2017-11-15 LAB — COMPREHENSIVE METABOLIC PANEL
ALT: 14 U/L (ref 0–44)
AST: 18 U/L (ref 15–41)
Albumin: 3.7 g/dL (ref 3.5–5.0)
Alkaline Phosphatase: 81 U/L (ref 38–126)
Anion gap: 7 (ref 5–15)
BUN: 15 mg/dL (ref 8–23)
CO2: 30 mmol/L (ref 22–32)
Calcium: 9.8 mg/dL (ref 8.9–10.3)
Chloride: 102 mmol/L (ref 98–111)
Creatinine, Ser: 0.94 mg/dL (ref 0.44–1.00)
GFR calc Af Amer: >60 mL/min (ref >60)
GFR calc non Af Amer: >60 mL/min (ref >60)
Glucose, Bld: 83 mg/dL (ref 70–99)
Potassium: 3.8 mmol/L (ref 3.5–5.1)
Sodium: 139 mmol/L (ref 135–145)
Total Bilirubin: 0.6 mg/dL (ref 0.3–1.2)
Total Protein: 6.8 g/dL (ref 6.5–8.1)

## 2017-11-15 LAB — CBC WITH DIFFERENTIAL/PLATELET
Basophils Absolute: 0.1 10*3/uL (ref 0.0–0.1)
Basophils Relative: 2 %
Eosinophils Absolute: 0.3 10*3/uL (ref 0.0–0.5)
Eosinophils Relative: 7 %
HCT: 37.7 % (ref 34.8–46.6)
Hemoglobin: 12.2 g/dL (ref 11.6–15.9)
Lymphocytes Relative: 29 %
Lymphs Abs: 1.1 10*3/uL (ref 0.9–3.3)
MCH: 28.8 pg (ref 25.1–34.0)
MCHC: 32.4 g/dL (ref 31.5–36.0)
MCV: 88.9 fL (ref 79.5–101.0)
Monocytes Absolute: 0.5 10*3/uL (ref 0.1–0.9)
Monocytes Relative: 12 %
Neutro Abs: 2 10*3/uL (ref 1.5–6.5)
Neutrophils Relative %: 50 %
Platelets: 192 10*3/uL (ref 145–400)
RBC: 4.24 MIL/uL (ref 3.70–5.45)
RDW: 16 % — ABNORMAL HIGH (ref 11.2–14.5)
WBC: 3.9 10*3/uL (ref 3.9–10.3)

## 2017-11-15 MED ORDER — SODIUM CHLORIDE 0.9 % IV SOLN
Freq: Once | INTRAVENOUS | Status: AC
  Administered 2017-11-15: 12:00:00 via INTRAVENOUS

## 2017-11-15 MED ORDER — ZOLEDRONIC ACID 4 MG/100ML IV SOLN
4.0000 mg | Freq: Once | INTRAVENOUS | Status: AC
  Administered 2017-11-15: 4 mg via INTRAVENOUS
  Filled 2017-11-15: qty 100

## 2017-11-15 NOTE — Assessment & Plan Note (Signed)
She was recently evaluated by ENT surgeon I will defer to them for further management I believe her symptom is unrelated to her current treatment.

## 2017-11-15 NOTE — Progress Notes (Signed)
[place] OFFICE PROGRESS NOTE  Patient Care Team: Lucianne Lei, MD as PCP - General (Family Medicine)  ASSESSMENT & PLAN:  Multiple myeloma with failed remission [facility]) She has mild intermittent leukopenia due to side effects of treatment We will continue reduced dose of Pomalyst at 2 mg, to be taken on days 1-21, rest 7 days She will continue acyclovir for antimicrobial prophylaxis She will Continue calcium with vitamin D, along with Zometa every 3 months, due today She is reminded to take aspirin for DVT prophylaxis  Peripheral neuropathy due to chemotherapy [facility]) She felt that her peripheral neuropathy is worse, likely exacerbated by ongoing chemotherapy and the cold weather She will continue gabapentin 900 mg twice a day  Morbid obesity with BMI of 40.0-44.9, adult ([place]) We discussed dietary modification and weight loss strategy She appears motivated to lose some weight  Anosmia She was recently evaluated by ENT surgeon I will defer to them for further management I believe her symptom is unrelated to her current treatment.   No orders of the defined types were placed in this encounter.   INTERVAL HISTORY: Please see below for problem oriented charting. She returns for further follow-up She was recently seen by ENT surgeon for anosmia This is because of altered taste sensation but she has not lost any weight In fact, she has gained more than 10 pounds since the last time I saw her which she attributed to extra salt intake to compensate leading to fluid retention She has not exercised lately Her peripheral neuropathy remains stable She denies new bone pain No recent infection She denies recent dental issues  SUMMARY OF ONCOLOGIC HISTORY:   Multiple myeloma with failed remission ([place])   03/22/2016 Bone Marrow Biopsy    Bone Marrow Biopsy: Plasma cells 17% by Aspirate and 30% by CD 138 stain. Plasma cell neoplasm, Kappa Restricted Normal cytogenetics,  FISH positive for +11 and +14 and +7      04/13/2016 Imaging    Skeletal survey showed lucencies within the calvarium worrisome for myeloma. No definite abnormal lytic or blastic lesions are observed elsewhere.      04/19/2016 - 09/10/2016 Chemotherapy    The patient consented to treatment with Revlimid, dexamethasone and Velcade      09/14/2016 Bone Marrow Biopsy    In summary, there is a normal cellular marrow with trilineage hematopoiesis.A mild plasmacytosis is present, but there is no definitive evidence of residual multiple myeloma      10/12/2016 - 10/12/2016 Chemotherapy    She received melphalan as conditioning chemo      10/13/2016 Bone Marrow Transplant    She received autologous stem cell transplant at [facility]      01/24/2017 Bone Marrow Biopsy    BONE MARROW: -Normocellular marrow for age (50%) with trilineage hematopoiesis. -No morphologic or immunohistochemical evidence of involvement by plasma cell neoplasm (see comment)  PERIPHERAL BLOOD: -Unremarkable (see CBC data)  COMMENT: The bone marrow is normocellular for age and shows adequate trilineage hematopoiesis without significant (<10%) dysplastic changes present. Blasts are not increased. Plasma cells represent about 2% of total cells on aspirate smears. Appropriately controlled immunohistochemical stains are performed on the core biopsy. CD138 highlights small interstitial plasma cells comprising approximately 2% of total cells. These plasma cells appear polytypic as demonstrated by kappa and lambda in-situ hybridization.      01/25/2017 PET scan    No FDG avid osseous lesions or masses are identified.      02/18/2017 - 04/01/2017  Chemotherapy    She received weekly Velcade, Pomalyst and Dexamethasone      04/15/2017 -  Chemotherapy    The patient had Pomalyst only      04/20/2017 Bone Marrow Biopsy    Bone Marrow (BM) and Peripheral Blood (PB) FINAL PATHOLOGIC DIAGNOSIS BONE  MARROW: Normocellular bone marrow (40%) with normal numbers of megakaryocytes, erythroid hyperplasia and no increase in plasma cells (1%).       REVIEW OF SYSTEMS:   Constitutional: Denies fevers, chills or abnormal weight loss Eyes: Denies blurriness of vision Ears, nose, mouth, throat, and face: Denies mucositis or sore throat Respiratory: Denies cough, dyspnea or wheezes Cardiovascular: Denies palpitation, chest discomfort or lower extremity swelling Gastrointestinal:  Denies nausea, heartburn or change in bowel habits Skin: Denies abnormal skin rashes Lymphatics: Denies new lymphadenopathy or easy bruising Neurological:Denies numbness, tingling or new weaknesses Behavioral/Psych: Mood is stable, no new changes  All other systems were reviewed with the patient and are negative.  I have reviewed the past medical history, past surgical history, social history and family history with the patient and they are unchanged from previous note.  ALLERGIES:  is allergic to revlimid [lenalidomide].  MEDICATIONS:  Current Outpatient Medications  Medication Sig Dispense Refill  . ASPIRIN 81 PO Take 81 mg by mouth daily.    . bisacodyl (BISACODYL) 5 MG EC tablet Take 5 mg by mouth daily as needed for moderate constipation.    . Calcium Carbonate-Vitamin D (CALCIUM PLUS VITAMIN D PO) Take 1 tablet daily by mouth.    . calcium-vitamin D (OSCAL WITH D) 500-200 MG-UNIT tablet Take 2 tablets by mouth daily.    . Chlorphen-PE-Acetaminophen 4-10-325 MG TABS Take 1 tablet by mouth every 6 (six) hours as needed for allergies.    Marland Kitchen gabapentin (NEURONTIN) 300 MG capsule Take 3 capsules (900 mg total) by mouth 2 (two) times daily. 540 capsule 3  . losartan-hydrochlorothiazide (HYZAAR) 100-25 MG tablet Take 1 tablet by mouth daily. (Patient taking differently: Take 1 tablet by mouth daily. TAKES 1/2 TABLET DAILY) 90 tablet 3  . Multiple Vitamin (MULTIVITAMIN) tablet Take 1 tablet by mouth daily.    .  ondansetron (ZOFRAN) 8 MG tablet Take 1 tablet (8 mg total) by mouth 2 (two) times daily as needed (Nausea or vomiting). (Patient not taking: Reported on 08/24/2016) 30 tablet 1  . pomalidomide (POMALYST) 2 MG capsule TAKE 1 CAPSULE BY MOUTH ONCE DAILY FOR 21 DAYS ON AND 7 DAYS OFF 21 capsule 11  . prochlorperazine (COMPAZINE) 10 MG tablet Take 1 tablet (10 mg total) by mouth every 6 (six) hours as needed (Nausea or vomiting). (Patient not taking: Reported on 06/22/2016) 30 tablet 1  . Sodium Fluoride 0.15 % PSTE Use as directed in the mouth or throat.    . traMADol (ULTRAM) 50 MG tablet TAKE 1 TABLET BY MOUTH EVERY 6 HOURS AS NEEDED FOR PAIN 90 tablet 0  . triamcinolone (NASACORT) 55 MCG/ACT AERO nasal inhaler Place 2 sprays into the nose daily. (Patient taking differently: Place 2 sprays daily as needed into the nose (allergies). Uses prn) 1 Inhaler 12   No current facility-administered medications for this visit.     PHYSICAL EXAMINATION: ECOG PERFORMANCE STATUS: 1 - Symptomatic but completely ambulatory  Vitals:   11/15/17 1033  BP: 120/78  Pulse: (!) 59  Resp: 18  Temp: 98.4 F (36.9 C)  SpO2: 100%   Filed Weights   11/15/17 1033  Weight: 280 lb 11.2 oz (127.3 kg)  GENERAL:alert, no distress and comfortable SKIN: skin color, texture, turgor are normal, no rashes or significant lesions EYES: normal, Conjunctiva are pink and non-injected, sclera clear OROPHARYNX:no exudate, no erythema and lips, buccal mucosa, and tongue normal  NECK: supple, thyroid normal size, non-tender, without nodularity LYMPH:  no palpable lymphadenopathy in the cervical, axillary or inguinal LUNGS: clear to auscultation and percussion with normal breathing effort HEART: regular rate & rhythm and no murmurs and no lower extremity edema ABDOMEN:abdomen soft, non-tender and normal bowel sounds Musculoskeletal:no cyanosis of digits and no clubbing  NEURO: alert & oriented x 3 with fluent speech, no focal  motor/sensory deficits  LABORATORY DATA:  I have reviewed the data as listed    Component Value Date/Time   NA 139 11/15/2017 1012   NA 142 05/09/2017 0840   K 3.8 11/15/2017 1012   K 4.1 05/09/2017 0840   CL 102 11/15/2017 1012   CO2 30 11/15/2017 1012   CO2 28 05/09/2017 0840   GLUCOSE 83 11/15/2017 1012   GLUCOSE 61 (L) 05/09/2017 0840   BUN 15 11/15/2017 1012   BUN 14.4 05/09/2017 0840   CREATININE 0.94 11/15/2017 1012   CREATININE 0.9 05/09/2017 0840   CALCIUM 9.8 11/15/2017 1012   CALCIUM 9.3 05/09/2017 0840   PROT 6.8 11/15/2017 1012   PROT 6.1 05/09/2017 0840   PROT 6.3 (L) 05/09/2017 0840   ALBUMIN 3.7 11/15/2017 1012   ALBUMIN 3.4 (L) 05/09/2017 0840   AST 18 11/15/2017 1012   AST 20 05/09/2017 0840   ALT 14 11/15/2017 1012   ALT 21 05/09/2017 0840   ALKPHOS 81 11/15/2017 1012   ALKPHOS 81 05/09/2017 0840   BILITOT 0.6 11/15/2017 1012   BILITOT 0.74 05/09/2017 0840   GFRNONAA >60 11/15/2017 1012   GFRAA >60 11/15/2017 1012    No results found for: SPEP, UPEP  Lab Results  Component Value Date   WBC 3.9 11/15/2017   NEUTROABS 2.0 11/15/2017   HGB 12.2 11/15/2017   HCT 37.7 11/15/2017   MCV 88.9 11/15/2017   PLT 192 11/15/2017      Chemistry      Component Value Date/Time   NA 139 11/15/2017 1012   NA 142 05/09/2017 0840   K 3.8 11/15/2017 1012   K 4.1 05/09/2017 0840   CL 102 11/15/2017 1012   CO2 30 11/15/2017 1012   CO2 28 05/09/2017 0840   BUN 15 11/15/2017 1012   BUN 14.4 05/09/2017 0840   CREATININE 0.94 11/15/2017 1012   CREATININE 0.9 05/09/2017 0840      Component Value Date/Time   CALCIUM 9.8 11/15/2017 1012   CALCIUM 9.3 05/09/2017 0840   ALKPHOS 81 11/15/2017 1012   ALKPHOS 81 05/09/2017 0840   AST 18 11/15/2017 1012   AST 20 05/09/2017 0840   ALT 14 11/15/2017 1012   ALT 21 05/09/2017 0840   BILITOT 0.6 11/15/2017 1012   BILITOT 0.74 05/09/2017 0840      All questions were answered. The patient knows to call the  clinic with any problems, questions or concerns. No barriers to learning was detected.  I spent 15 minutes counseling the patient face to face. The total time spent in the appointment was 20 minutes and more than 50% was on counseling and review of test results  Heath Lark, MD 11/15/2017 4:02 PM

## 2017-11-15 NOTE — Assessment & Plan Note (Signed)
She felt that her peripheral neuropathy is worse, likely exacerbated by ongoing chemotherapy and the cold weather She will continue gabapentin 900 mg twice a day

## 2017-11-15 NOTE — Assessment & Plan Note (Addendum)
She has mild intermittent leukopenia due to side effects of treatment We will continue reduced dose of Pomalyst at 2 mg, to be taken on days 1-21, rest 7 days She will continue acyclovir for antimicrobial prophylaxis She will Continue calcium with vitamin D, along with Zometa every 3 months, due today She is reminded to take aspirin for DVT prophylaxis

## 2017-11-15 NOTE — Telephone Encounter (Signed)
Gave patient avs and calendar of upcoming October appointments.

## 2017-11-15 NOTE — Patient Instructions (Signed)

## 2017-11-15 NOTE — Assessment & Plan Note (Signed)
We discussed dietary modification and weight loss strategy She appears motivated to lose some weight

## 2017-11-16 LAB — KAPPA/LAMBDA LIGHT CHAINS
Kappa free light chain: 31.7 mg/L — ABNORMAL HIGH (ref 3.3–19.4)
Kappa, lambda light chain ratio: 1.66 — ABNORMAL HIGH (ref 0.26–1.65)
Lambda free light chains: 19.1 mg/L (ref 5.7–26.3)

## 2017-11-16 LAB — MULTIPLE MYELOMA PANEL, SERUM
Albumin SerPl Elph-Mcnc: 3.3 g/dL (ref 2.9–4.4)
Albumin/Glob SerPl: 1.2 (ref 0.7–1.7)
Alpha 1: 0.2 g/dL (ref 0.0–0.4)
Alpha2 Glob SerPl Elph-Mcnc: 0.8 g/dL (ref 0.4–1.0)
B-Globulin SerPl Elph-Mcnc: 1 g/dL (ref 0.7–1.3)
Gamma Glob SerPl Elph-Mcnc: 1 g/dL (ref 0.4–1.8)
Globulin, Total: 3 g/dL (ref 2.2–3.9)
IgA: 113 mg/dL (ref 87–352)
IgG (Immunoglobin G), Serum: 996 mg/dL (ref 700–1600)
IgM (Immunoglobulin M), Srm: 28 mg/dL (ref 26–217)
Total Protein ELP: 6.3 g/dL (ref 6.0–8.5)

## 2017-11-18 ENCOUNTER — Telehealth: Payer: Self-pay | Admitting: *Deleted

## 2017-11-18 NOTE — Telephone Encounter (Signed)
-----   Message from Heath Lark, MD sent at 11/18/2017  8:49 AM EDT ----- Regarding: myeloma labs good   ----- Message ----- From: Buel Ream, [facility] [place] Sent: 11/15/2017  10:29 AM To: Heath Lark, MD

## 2017-11-18 NOTE — Telephone Encounter (Signed)
Notified of message below

## 2017-11-25 ENCOUNTER — Telehealth: Payer: Self-pay

## 2017-11-25 ENCOUNTER — Encounter: Payer: Self-pay | Admitting: Hematology and Oncology

## 2017-11-25 NOTE — Telephone Encounter (Signed)
email received from pt regarding need for Dr Erik Obey to schedule MRI.  This RN has left vm with Dr Noreene Filbert nurse, Amy, to return call regarding need for MRI.

## 2017-11-29 ENCOUNTER — Telehealth: Payer: Self-pay

## 2017-11-29 NOTE — Telephone Encounter (Signed)
Carolyn Newman at Dr. Noreene Filbert office called and left a message on VM. She called and spoke with Carolyn Newman on Friday. Dr. Erik Obey will order MRI. [facility] will be calling her to schedule MRI.

## 2017-12-01 ENCOUNTER — Other Ambulatory Visit: Payer: Self-pay | Admitting: Otolaryngology

## 2017-12-01 DIAGNOSIS — R430 Anosmia: Secondary | ICD-10-CM

## 2017-12-10 ENCOUNTER — Ambulatory Visit
Admission: RE | Admit: 2017-12-10 | Discharge: 2017-12-10 | Disposition: A | Discharge status: Home / Self Care | Payer: Medicare Other | Source: Ambulatory Visit | Attending: Otolaryngology | Admitting: Otolaryngology

## 2017-12-10 DIAGNOSIS — R430 Anosmia: Secondary | ICD-10-CM

## 2017-12-10 MED ORDER — GADOBENATE DIMEGLUMINE 529 MG/ML IV SOLN
20.0000 mL | Freq: Once | INTRAVENOUS | Status: AC | PRN
  Administered 2017-12-10: 20 mL via INTRAVENOUS

## 2017-12-16 ENCOUNTER — Other Ambulatory Visit: Payer: Self-pay | Admitting: *Deleted

## 2017-12-16 MED ORDER — POMALIDOMIDE 2 MG PO CAPS: ORAL_CAPSULE | ORAL | 11 refills | Status: DC

## 2018-01-09 ENCOUNTER — Other Ambulatory Visit: Payer: Self-pay | Admitting: *Deleted

## 2018-01-09 MED ORDER — POMALIDOMIDE 2 MG PO CAPS: ORAL_CAPSULE | ORAL | 11 refills | Status: DC

## 2018-02-03 ENCOUNTER — Other Ambulatory Visit: Payer: Self-pay

## 2018-02-03 MED ORDER — POMALIDOMIDE 2 MG PO CAPS: ORAL_CAPSULE | ORAL | 11 refills | Status: DC

## 2018-02-16 ENCOUNTER — Inpatient Hospital Stay: Payer: Medicare Other | Admitting: Hematology and Oncology

## 2018-02-16 ENCOUNTER — Inpatient Hospital Stay: Payer: Medicare Other | Attending: Hematology and Oncology

## 2018-02-16 ENCOUNTER — Encounter: Payer: Self-pay | Admitting: Hematology and Oncology

## 2018-02-16 ENCOUNTER — Telehealth: Payer: Self-pay | Admitting: Hematology and Oncology

## 2018-02-16 ENCOUNTER — Inpatient Hospital Stay: Payer: Medicare Other

## 2018-02-16 VITALS — BP 118/78 | HR 57 | Temp 98.1°F | Resp 18 | Ht 69.0 in | Wt 281.4 lb

## 2018-02-16 DIAGNOSIS — R430 Anosmia: Secondary | ICD-10-CM | POA: Diagnosis not present

## 2018-02-16 DIAGNOSIS — C9000 Multiple myeloma not having achieved remission: Secondary | ICD-10-CM

## 2018-02-16 DIAGNOSIS — Z23 Encounter for immunization: Secondary | ICD-10-CM | POA: Diagnosis not present

## 2018-02-16 DIAGNOSIS — G620 Drug-induced polyneuropathy: Secondary | ICD-10-CM

## 2018-02-16 DIAGNOSIS — T451X5A Adverse effect of antineoplastic and immunosuppressive drugs, initial encounter: Secondary | ICD-10-CM

## 2018-02-16 DIAGNOSIS — D61818 Other pancytopenia: Secondary | ICD-10-CM

## 2018-02-16 DIAGNOSIS — R5381 Other malaise: Secondary | ICD-10-CM | POA: Diagnosis not present

## 2018-02-16 LAB — CBC WITH DIFFERENTIAL/PLATELET
Basophils Absolute: 0 10*3/uL (ref 0.0–0.1)
Basophils Relative: 0 %
Eosinophils Absolute: 0.5 10*3/uL (ref 0.0–0.5)
Eosinophils Relative: 16 %
HCT: 37.9 % (ref 34.8–46.6)
Hemoglobin: 12.2 g/dL (ref 11.6–15.9)
Lymphocytes Relative: 29 %
Lymphs Abs: 0.9 10*3/uL (ref 0.9–3.3)
MCH: 28.7 pg (ref 25.1–34.0)
MCHC: 32.2 g/dL (ref 31.5–36.0)
MCV: 89.2 fL (ref 79.5–101.0)
Monocytes Absolute: 0.4 10*3/uL (ref 0.1–0.9)
Monocytes Relative: 14 %
Neutro Abs: 1.2 10*3/uL — ABNORMAL LOW (ref 1.5–6.5)
Neutrophils Relative %: 41 %
Platelets: 206 10*3/uL (ref 145–400)
RBC: 4.25 MIL/uL (ref 3.70–5.45)
RDW: 16.3 % — ABNORMAL HIGH (ref 11.2–14.5)
WBC: 3 10*3/uL — ABNORMAL LOW (ref 3.9–10.3)

## 2018-02-16 LAB — COMPREHENSIVE METABOLIC PANEL
ALT: 16 U/L (ref 0–44)
AST: 22 U/L (ref 15–41)
Albumin: 3.6 g/dL (ref 3.5–5.0)
Alkaline Phosphatase: 85 U/L (ref 38–126)
Anion gap: 10 (ref 5–15)
BUN: 17 mg/dL (ref 8–23)
CO2: 28 mmol/L (ref 22–32)
Calcium: 9.7 mg/dL (ref 8.9–10.3)
Chloride: 102 mmol/L (ref 98–111)
Creatinine, Ser: 0.92 mg/dL (ref 0.44–1.00)
GFR calc Af Amer: >60 mL/min (ref >60)
GFR calc non Af Amer: >60 mL/min (ref >60)
Glucose, Bld: 87 mg/dL (ref 70–99)
Potassium: 3.9 mmol/L (ref 3.5–5.1)
Sodium: 140 mmol/L (ref 135–145)
Total Bilirubin: 1.2 mg/dL (ref 0.3–1.2)
Total Protein: 7.5 g/dL (ref 6.5–8.1)

## 2018-02-16 MED ORDER — INFLUENZA VAC SPLIT QUAD 0.5 ML IM SUSY
PREFILLED_SYRINGE | INTRAMUSCULAR | Status: AC
  Filled 2018-02-16: qty 0.5

## 2018-02-16 MED ORDER — SODIUM CHLORIDE 0.9 % IV SOLN
Freq: Once | INTRAVENOUS | Status: AC
  Administered 2018-02-16: 09:00:00 via INTRAVENOUS
  Filled 2018-02-16: qty 250

## 2018-02-16 MED ORDER — INFLUENZA VAC SPLIT QUAD 0.5 ML IM SUSY
0.5000 mL | PREFILLED_SYRINGE | Freq: Once | INTRAMUSCULAR | Status: AC
  Administered 2018-02-16: 0.5 mL via INTRAMUSCULAR

## 2018-02-16 MED ORDER — ZOLEDRONIC ACID 4 MG/100ML IV SOLN
4.0000 mg | Freq: Once | INTRAVENOUS | Status: AC
  Administered 2018-02-16: 4 mg via INTRAVENOUS
  Filled 2018-02-16: qty 100

## 2018-02-16 NOTE — Assessment & Plan Note (Signed)
She continues to have anosmia She has seen ENT and had negative MRI I recommend neurology consultation and she agreed

## 2018-02-16 NOTE — Patient Instructions (Signed)

## 2018-02-16 NOTE — Assessment & Plan Note (Signed)
She has mild intermittent leukopenia due to side effects of treatment We will continue reduced dose of Pomalyst at 2 mg, to be taken on days 1-21, rest 7 days She will continue acyclovir for antimicrobial prophylaxis She will Continue calcium with vitamin D, along with Zometa every 3 months, due today She is reminded to take aspirin for DVT prophylaxis

## 2018-02-16 NOTE — Assessment & Plan Note (Signed)
She had some gait instability She has some neuropathy and mild weakness Denies recent falls I recommend physical therapy evaluation and she agreed

## 2018-02-16 NOTE — Assessment & Plan Note (Signed)
She has mild, intermittent leukopenia She is not symptomatic I recommend we proceed with treatment without delay She is advised to watch out for signs and symptoms for infection. We discussed the importance of preventive care and reviewed the vaccination programs. She does not have any prior allergic reactions to influenza vaccination. She agrees to proceed with influenza vaccination today and we will administer it today at the clinic.

## 2018-02-16 NOTE — Telephone Encounter (Signed)
Patient decline avs and calendar °

## 2018-02-16 NOTE — Progress Notes (Signed)
[facility] OFFICE PROGRESS NOTE  Patient Care Team: Lucianne Lei, MD as PCP - General (Family Medicine)  ASSESSMENT & PLAN:  Multiple myeloma with failed remission [facility]) She has mild intermittent leukopenia due to side effects of treatment We will continue reduced dose of Pomalyst at 2 mg, to be taken on days 1-21, rest 7 days She will continue acyclovir for antimicrobial prophylaxis She will Continue calcium with vitamin D, along with Zometa every 3 months, due today She is reminded to take aspirin for DVT prophylaxis  Pancytopenia, acquired ([place]) She has mild, intermittent leukopenia She is not symptomatic I recommend we proceed with treatment without delay She is advised to watch out for signs and symptoms for infection. We discussed the importance of preventive care and reviewed the vaccination programs. She does not have any prior allergic reactions to influenza vaccination. She agrees to proceed with influenza vaccination today and we will administer it today at the clinic.    Anosmia She continues to have anosmia She has seen ENT and had negative MRI I recommend neurology consultation and she agreed  Physical debility She had some gait instability She has some neuropathy and mild weakness Denies recent falls I recommend physical therapy evaluation and she agreed   Orders Placed This Encounter  Procedures  . Ambulatory referral to Neurology    Referral Priority:   Routine    Referral Type:   Consultation    Referral Reason:   Specialty Services Required    Requested Specialty:   Neurology    Number of Visits Requested:   1  . Ambulatory referral to Physical Therapy    Referral Priority:   Routine    Referral Type:   Physical Medicine    Referral Reason:   Specialty Services Required    Requested Specialty:   Physical Therapy    Number of Visits Requested:   1    INTERVAL HISTORY: Please see below for problem oriented charting. She returns for  further follow-up She denies recent dental issues She sees dentist regularly Denies recent infection, fever or chills She has persistent neuropathy, taking gabapentin She has not have influenza vaccination She is bothered with her gait instability She is bothered by anosmia and would like further evaluation  SUMMARY OF ONCOLOGIC HISTORY:   Multiple myeloma with failed remission ([place])   03/22/2016 Bone Marrow Biopsy    Bone Marrow Biopsy: Plasma cells 17% by Aspirate and 30% by CD 138 stain. Plasma cell neoplasm, Kappa Restricted Normal cytogenetics, FISH positive for +11 and +14 and +7    04/13/2016 Imaging    Skeletal survey showed lucencies within the calvarium worrisome for myeloma. No definite abnormal lytic or blastic lesions are observed elsewhere.    04/19/2016 - 09/10/2016 Chemotherapy    The patient consented to treatment with Revlimid, dexamethasone and Velcade    09/14/2016 Bone Marrow Biopsy    In summary, there is a normal cellular marrow with trilineage hematopoiesis.A mild plasmacytosis is present, but there is no definitive evidence of residual multiple myeloma    10/12/2016 - 10/12/2016 Chemotherapy    She received melphalan as conditioning chemo    10/13/2016 Bone Marrow Transplant    She received autologous stem cell transplant at [facility]    01/24/2017 Bone Marrow Biopsy    BONE MARROW: -Normocellular marrow for age (50%) with trilineage hematopoiesis. -No morphologic or immunohistochemical evidence of involvement by plasma cell neoplasm (see comment)  PERIPHERAL BLOOD: -Unremarkable (see CBC data)  COMMENT: The  bone marrow is normocellular for age and shows adequate trilineage hematopoiesis without significant (<10%) dysplastic changes present. Blasts are not increased. Plasma cells represent about 2% of total cells on aspirate smears. Appropriately controlled immunohistochemical stains are performed on the core biopsy. CD138  highlights small interstitial plasma cells comprising approximately 2% of total cells. These plasma cells appear polytypic as demonstrated by kappa and lambda in-situ hybridization.    01/25/2017 PET scan    No FDG avid osseous lesions or masses are identified.    02/18/2017 - 04/01/2017 Chemotherapy    She received weekly Velcade, Pomalyst and Dexamethasone    04/15/2017 -  Chemotherapy    The patient had Pomalyst only    04/20/2017 Bone Marrow Biopsy    Bone Marrow (BM) and Peripheral Blood (PB) FINAL PATHOLOGIC DIAGNOSIS BONE MARROW: Normocellular bone marrow (40%) with normal numbers of megakaryocytes, erythroid hyperplasia and no increase in plasma cells (1%).     REVIEW OF SYSTEMS:   Constitutional: Denies fevers, chills or abnormal weight loss Eyes: Denies blurriness of vision Ears, nose, mouth, throat, and face: Denies mucositis or sore throat Respiratory: Denies cough, dyspnea or wheezes Cardiovascular: Denies palpitation, chest discomfort or lower extremity swelling Gastrointestinal:  Denies nausea, heartburn or change in bowel habits Skin: Denies abnormal skin rashes Lymphatics: Denies new lymphadenopathy or easy bruising Behavioral/Psych: Mood is stable, no new changes  All other systems were reviewed with the patient and are negative.  I have reviewed the past medical history, past surgical history, social history and family history with the patient and they are unchanged from previous note.  ALLERGIES:  is allergic to revlimid [lenalidomide].  MEDICATIONS:  Current Outpatient Medications  Medication Sig Dispense Refill  . ASPIRIN 81 PO Take 81 mg by mouth daily.    . bisacodyl (BISACODYL) 5 MG EC tablet Take 5 mg by mouth daily as needed for moderate constipation.    . Calcium Carbonate-Vitamin D (CALCIUM PLUS VITAMIN D PO) Take 1 tablet daily by mouth.    . calcium-vitamin D (OSCAL WITH D) 500-200 MG-UNIT tablet Take 2 tablets by mouth daily.    .  Chlorphen-PE-Acetaminophen 4-10-325 MG TABS Take 1 tablet by mouth every 6 (six) hours as needed for allergies.    Marland Kitchen gabapentin (NEURONTIN) 300 MG capsule Take 3 capsules (900 mg total) by mouth 2 (two) times daily. 540 capsule 3  . losartan-hydrochlorothiazide (HYZAAR) 100-25 MG tablet Take 1 tablet by mouth daily. (Patient taking differently: Take 1 tablet by mouth daily. TAKES 1/2 TABLET DAILY) 90 tablet 3  . Multiple Vitamin (MULTIVITAMIN) tablet Take 1 tablet by mouth daily.    . ondansetron (ZOFRAN) 8 MG tablet Take 1 tablet (8 mg total) by mouth 2 (two) times daily as needed (Nausea or vomiting). (Patient not taking: Reported on 08/24/2016) 30 tablet 1  . pomalidomide (POMALYST) 2 MG capsule TAKE 1 CAPSULE BY MOUTH ONCE DAILY FOR 21 DAYS ON AND 7 DAYS OFF 21 capsule 11  . prochlorperazine (COMPAZINE) 10 MG tablet Take 1 tablet (10 mg total) by mouth every 6 (six) hours as needed (Nausea or vomiting). (Patient not taking: Reported on 06/22/2016) 30 tablet 1  . Sodium Fluoride 0.15 % PSTE Use as directed in the mouth or throat.    . traMADol (ULTRAM) 50 MG tablet TAKE 1 TABLET BY MOUTH EVERY 6 HOURS AS NEEDED FOR PAIN 90 tablet 0  . triamcinolone (NASACORT) 55 MCG/ACT AERO nasal inhaler Place 2 sprays into the nose daily. (Patient taking differently: Place 2  sprays daily as needed into the nose (allergies). Uses prn) 1 Inhaler 12   No current facility-administered medications for this visit.    Facility-Administered Medications Ordered in Other Visits  Medication Dose Route Frequency Provider Last Rate Last Dose  . Zoledronic Acid (ZOMETA) IVPB 4 mg  4 mg Intravenous Once Alvy Bimler, Leviathan Macera, MD        PHYSICAL EXAMINATION: ECOG PERFORMANCE STATUS: 1 - Symptomatic but completely ambulatory  Vitals:   02/16/18 0852  BP: 118/78  Pulse: (!) 57  Resp: 18  Temp: 98.1 F (36.7 C)  SpO2: 100%   Filed Weights   02/16/18 0852  Weight: 281 lb 6.4 oz (127.6 kg)    GENERAL:alert, no distress and  comfortable SKIN: skin color, texture, turgor are normal, no rashes or significant lesions EYES: normal, Conjunctiva are pink and non-injected, sclera clear OROPHARYNX:no exudate, no erythema and lips, buccal mucosa, and tongue normal  NECK: supple, thyroid normal size, non-tender, without nodularity LYMPH:  no palpable lymphadenopathy in the cervical, axillary or inguinal LUNGS: clear to auscultation and percussion with normal breathing effort HEART: regular rate & rhythm and no murmurs and no lower extremity edema ABDOMEN:abdomen soft, non-tender and normal bowel sounds Musculoskeletal:no cyanosis of digits and no clubbing  NEURO: alert & oriented x 3 with fluent speech, no focal motor/sensory deficits  LABORATORY DATA:  I have reviewed the data as listed    Component Value Date/Time   NA 140 02/16/2018 0820   NA 142 05/09/2017 0840   K 3.9 02/16/2018 0820   K 4.1 05/09/2017 0840   CL 102 02/16/2018 0820   CO2 28 02/16/2018 0820   CO2 28 05/09/2017 0840   GLUCOSE 87 02/16/2018 0820   GLUCOSE 61 (L) 05/09/2017 0840   BUN 17 02/16/2018 0820   BUN 14.4 05/09/2017 0840   CREATININE 0.92 02/16/2018 0820   CREATININE 0.9 05/09/2017 0840   CALCIUM 9.7 02/16/2018 0820   CALCIUM 9.3 05/09/2017 0840   PROT 7.5 02/16/2018 0820   PROT 6.1 05/09/2017 0840   PROT 6.3 (L) 05/09/2017 0840   ALBUMIN 3.6 02/16/2018 0820   ALBUMIN 3.4 (L) 05/09/2017 0840   AST 22 02/16/2018 0820   AST 20 05/09/2017 0840   ALT 16 02/16/2018 0820   ALT 21 05/09/2017 0840   ALKPHOS 85 02/16/2018 0820   ALKPHOS 81 05/09/2017 0840   BILITOT 1.2 02/16/2018 0820   BILITOT 0.74 05/09/2017 0840   GFRNONAA >60 02/16/2018 0820   GFRAA >60 02/16/2018 0820    No results found for: SPEP, UPEP  Lab Results  Component Value Date   WBC 3.0 (L) 02/16/2018   NEUTROABS 1.2 (L) 02/16/2018   HGB 12.2 02/16/2018   HCT 37.9 02/16/2018   MCV 89.2 02/16/2018   PLT 206 02/16/2018      Chemistry      Component Value  Date/Time   NA 140 02/16/2018 0820   NA 142 05/09/2017 0840   K 3.9 02/16/2018 0820   K 4.1 05/09/2017 0840   CL 102 02/16/2018 0820   CO2 28 02/16/2018 0820   CO2 28 05/09/2017 0840   BUN 17 02/16/2018 0820   BUN 14.4 05/09/2017 0840   CREATININE 0.92 02/16/2018 0820   CREATININE 0.9 05/09/2017 0840      Component Value Date/Time   CALCIUM 9.7 02/16/2018 0820   CALCIUM 9.3 05/09/2017 0840   ALKPHOS 85 02/16/2018 0820   ALKPHOS 81 05/09/2017 0840   AST 22 02/16/2018 0820   AST 20 05/09/2017  0840   ALT 16 02/16/2018 0820   ALT 21 05/09/2017 0840   BILITOT 1.2 02/16/2018 0820   BILITOT 0.74 05/09/2017 0840      All questions were answered. The patient knows to call the clinic with any problems, questions or concerns. No barriers to learning was detected.  I spent 25 minutes counseling the patient face to face. The total time spent in the appointment was 30 minutes and more than 50% was on counseling and review of test results  Heath Lark, MD 02/16/2018 9:21 AM

## 2018-02-17 LAB — MULTIPLE MYELOMA PANEL, SERUM
Albumin SerPl Elph-Mcnc: 3.4 g/dL (ref 2.9–4.4)
Albumin/Glob SerPl: 1 (ref 0.7–1.7)
Alpha 1: 0.2 g/dL (ref 0.0–0.4)
Alpha2 Glob SerPl Elph-Mcnc: 0.9 g/dL (ref 0.4–1.0)
B-Globulin SerPl Elph-Mcnc: 1.1 g/dL (ref 0.7–1.3)
Gamma Glob SerPl Elph-Mcnc: 1.4 g/dL (ref 0.4–1.8)
Globulin, Total: 3.5 g/dL (ref 2.2–3.9)
IgA: 157 mg/dL (ref 87–352)
IgG (Immunoglobin G), Serum: 1466 mg/dL (ref 700–1600)
IgM (Immunoglobulin M), Srm: 34 mg/dL (ref 26–217)
Total Protein ELP: 6.9 g/dL (ref 6.0–8.5)

## 2018-02-17 LAB — KAPPA/LAMBDA LIGHT CHAINS
Kappa free light chain: 55.3 mg/L — ABNORMAL HIGH (ref 3.3–19.4)
Kappa, lambda light chain ratio: 2.06 — ABNORMAL HIGH (ref 0.26–1.65)
Lambda free light chains: 26.9 mg/L — ABNORMAL HIGH (ref 5.7–26.3)

## 2018-02-20 ENCOUNTER — Telehealth: Payer: Self-pay

## 2018-02-20 NOTE — Telephone Encounter (Signed)
-----   Message from Heath Lark, MD sent at 02/20/2018 10:19 AM EDT ----- Regarding: myeloma panel ok Let her know myeloma panel is ok

## 2018-02-20 NOTE — Telephone Encounter (Signed)
Called and left a message asking her to call the office. 

## 2018-02-21 ENCOUNTER — Other Ambulatory Visit: Payer: Self-pay | Admitting: Hematology and Oncology

## 2018-02-21 ENCOUNTER — Telehealth: Payer: Self-pay

## 2018-02-21 ENCOUNTER — Encounter: Payer: Self-pay | Admitting: Hematology and Oncology

## 2018-02-21 DIAGNOSIS — R430 Anosmia: Secondary | ICD-10-CM

## 2018-02-21 NOTE — Telephone Encounter (Signed)
I ordered a new referral Please call

## 2018-02-21 NOTE — Telephone Encounter (Signed)
Called and left a message to call the office regarding neurology referral.

## 2018-02-21 NOTE — Telephone Encounter (Signed)
She called back and she received message that [facility] does not treat that dx. She ask that referral be sent to [facility] first and if they are unable to do referral then send to [facility].

## 2018-02-22 NOTE — Telephone Encounter (Signed)
Called and left a message at [facility]. Ask if they could call patient regarding referral in epic for appt and to call the office for questions.

## 2018-03-02 ENCOUNTER — Encounter: Payer: Self-pay | Admitting: Physical Therapy

## 2018-03-02 ENCOUNTER — Ambulatory Visit: Payer: Medicare Other | Attending: Hematology and Oncology | Admitting: Physical Therapy

## 2018-03-02 DIAGNOSIS — M6281 Muscle weakness (generalized): Secondary | ICD-10-CM | POA: Diagnosis present

## 2018-03-02 DIAGNOSIS — R262 Difficulty in walking, not elsewhere classified: Secondary | ICD-10-CM | POA: Insufficient documentation

## 2018-03-02 NOTE — Therapy (Signed)
[address] [place], [place], [address] Phone: [phone]   Fax:  [phone]  Physical Therapy Evaluation/Discharge  Patient Details  Name: Carolyn Newman MRN: [id number] Date of Birth: 1951/03/21 Referring Provider (PT): MD Heath Lark   Encounter Date: 03/02/2018  PT End of Session - 03/02/18 1023    Visit Number  1    Number of Visits  1    Authorization Type  [employer] MCR    PT Start Time  0935    PT Stop Time  1017    PT Time Calculation (min)  42 min    Activity Tolerance  Patient tolerated treatment well    Behavior During Therapy  [facility] for tasks assessed/performed       Past Medical History:  Diagnosis Date  . Anemia   . Cancer ([place])    myeloma  . Hypertension     Past Surgical History:  Procedure Laterality Date  . EYE SURGERY      There were no vitals filed for this visit.   Subjective Assessment - 03/02/18 0940    Subjective  Pt reports she is in remission for multiple myeloma and has been on chemotherapy. She now has peripheral neuropathy and difficulty with gait and balance. She was referred to PT for gait imbalance and physical debility. She also relays some intermittent leg cramping and pain, she has history of OA and has had some relief with injections at [facility].  She does not think she needs regular PT and is more interested in home exercise program at this time.    Pertinent History  PMH: Peripheral neuropathy due to chemotherapy for cancer (myeloma), anemia, morbid obesity    Patient Stated Goals  get some kind of assessment for my balance so I can work on it at home    Currently in Pain?  Yes    Pain Score  3     Pain Location  Leg    Pain Orientation  Right;Left    Pain Descriptors / Indicators  Aching    Pain Type  Chronic pain    Pain Frequency  Intermittent         [facility] PT Assessment - 03/02/18 0001      Assessment   Medical Diagnosis  gait imbalance,  physical debility    Referring Provider (PT)  MD Heath Lark    Next MD Visit  January 2020    Prior Therapy  PT years ago for achillies      Precautions   Precautions  None      Balance Screen   Has the patient fallen in the past 6 months  No    Has the patient had a decrease in activity level because of a fear of falling?   No    Is the patient reluctant to leave their home because of a fear of falling?   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       Balance   Balance Assessed  Yes      Standardized Balance Assessment   Standardized Balance Assessment  Berg Balance Test;Dynamic Gait Index      Berg Balance Test   Sit to Stand  Able to stand without using hands and stabilize independently    Standing Unsupported  Able to stand safely 2 minutes    Sitting with Back Unsupported but Feet Supported on Floor or Stool  Able to sit safely and securely 2 minutes    Stand to Sit  Sits safely with minimal use of hands    Transfers  Able to transfer safely, minor use of hands    Standing Unsupported with Eyes Closed  Able to stand 10 seconds safely    Standing Ubsupported with Feet Together  Able to place feet together independently and stand 1 minute safely    From Standing, Reach Forward with Outstretched Arm  Can reach  confidently >25 cm (10")    From Standing Position, Pick up Object from Floor  Able to pick up shoe safely and easily    From Standing Position, Turn to Look Behind Over each Shoulder  Looks behind from both sides and weight shifts well    Turn 360 Degrees  Able to turn 360 degrees safely in 4 seconds or less    Standing Unsupported, Alternately Place Feet on Step/Stool  Able to stand independently and safely and complete 8 steps in 20 seconds    Standing Unsupported, One Foot in Front  Able to plae foot ahead of the other independently and hold 30 seconds    Standing on One Leg  Tries to lift leg/unable to hold 3 seconds but remains standing independently    Total Score  52      Dynamic Gait Index   Level Surface  Normal    Change in Gait Speed  Normal    Gait with Horizontal Head Turns  Normal    Gait with Vertical Head Turns  Normal    Gait and Pivot Turn  Normal    Step Over Obstacle  Normal    Step Around Obstacles  Normal    Steps  Normal    Total Score  24                Objective measurements completed on examination: See above findings.              PT Education - 03/02/18 1023    Education Details  HEP, POC, balance test results    Person(s) Educated  Patient    Methods  Explanation;Demonstration;Verbal cues;Handout    Comprehension  Verbalized understanding;Returned demonstration          PT Long Term Goals - 03/02/18 1029      PT LONG TERM GOAL #1   Title  Pt will be demonstate good understanding of HEP and was provided illustrated copy. Met today.     Status  Achieved             Plan - 03/02/18 1024    Clinical Impression Statement  Pt presents with minor gait and balance  deficits but these are very minor. She had a 52/56 on BERG balance test, and 24/24 of Dynamic gait index. She does have slight deviation to Rt on challenging dynamic gait and balance but this may due more to Rt hip weakness found on MMT testing. Her balance test  scores do not place her at a risk for falling and she feels confident she can work on her minor deficits at home. She was given HEP for high level balance training, and Rt hip strengthening. She showed good understanding and return demonstration with HEP. She had no further questions or concerns and will be D/C to HEP. She was encouraged to come back to PT if she feels like she regresses or is not making progress at home.     History and Personal Factors relevant to plan of care:  PMH: Peripheral neuropathy due to chemotherapy for cancer (myeloma), anemia, morbid obesity    Clinical Presentation  Stable    Clinical Presentation due to:  co-mordidities    Clinical Decision Making  Moderate    PT Frequency  One time visit    PT Next Visit Plan  D/C to HEP this visit    Consulted and Agree with Plan of Care  Patient       Patient will benefit from skilled therapeutic intervention in order to improve the following deficits and impairments:     Visit Diagnosis: Difficulty in walking, not elsewhere classified  Muscle weakness (generalized)     Problem List Patient Active Problem List   Diagnosis Date Noted  . Anosmia 11/15/2017  . Morbid obesity with BMI of 40.0-44.9, adult ([place]) 08/18/2017  . Chemotherapy induced neutropenia ([place]) 05/09/2017  . Hypotension due to drugs 04/15/2017  . Hyperglycemia, drug-induced 03/25/2017  . Sinus congestion 03/25/2017  . S/P autologous bone marrow transplantation ([place]) 02/01/2017  . Peripheral neuropathy due to chemotherapy ([place]) 07/13/2016  . Physical debility 07/13/2016  . Allergic rhinitis 07/02/2016  . Rash due to allergy 07/02/2016  . Anemia due to antineoplastic chemotherapy 06/23/2016  . Financial difficulty 05/22/2016  . Pancytopenia, acquired ([place]) 04/19/2016  . Multiple myeloma not having achieved remission ([place]) 04/05/2016  . Multiple myeloma with failed remission ([place]) 04/04/2016  . Normocytic anemia 03/09/2016    Debbe Odea, PT,  DPT 03/02/2018, 10:31 AM  [address] [place], [place], [address] Phone: [phone]   Fax:  [phone]  Name: TRESSY KUNZMAN MRN: [id number] Date of Birth: February 25, 1951   PHYSICAL THERAPY DISCHARGE SUMMARY  Visits from Start of Care: 1  Current functional level related to goals / functional outcomes: See above   Remaining deficits: See above   Education / Equipment: HEP provided for balance training, and Rt hip strength Plan: Patient agrees to discharge.  Patient goals were met. Patient is being discharged due to                                                     ?????    Elsie Ra, PT, DPT 03/02/18 10:32 AM

## 2018-03-13 ENCOUNTER — Other Ambulatory Visit: Payer: Self-pay | Admitting: Hematology and Oncology

## 2018-03-13 NOTE — Telephone Encounter (Signed)
-

## 2018-03-14 ENCOUNTER — Other Ambulatory Visit: Payer: Self-pay | Admitting: Hematology and Oncology

## 2018-03-14 ENCOUNTER — Encounter: Payer: Self-pay | Admitting: Hematology and Oncology

## 2018-03-14 ENCOUNTER — Telehealth: Payer: Self-pay

## 2018-03-14 MED ORDER — TRAMADOL HCL 50 MG PO TABS: 50.0000 mg | ORAL_TABLET | Freq: Four times a day (QID) | ORAL | 0 refills | Status: DC | PRN

## 2018-03-14 NOTE — Telephone Encounter (Signed)
Called regarding mychart message. Per Dr. Alvy Bimler Multivitamin is not covered by insurance, all OTC. She verbalized understanding.

## 2018-04-10 ENCOUNTER — Other Ambulatory Visit: Payer: Self-pay | Admitting: *Deleted

## 2018-04-10 MED ORDER — POMALIDOMIDE 2 MG PO CAPS: ORAL_CAPSULE | ORAL | 11 refills | Status: DC

## 2018-04-11 ENCOUNTER — Other Ambulatory Visit: Payer: Self-pay

## 2018-04-11 NOTE — Patient Outreach (Signed)
[facility]) Care Management  04/11/2018  Carolyn Newman 1950/10/24 [id number]   Medication Adherence call to Carolyn Newman spoke with patient she is only taking 1/2 tablet of Losartan / HCTZ 100/25 mg it was a change from doctor she will ask doctor on her next appointment he she is going to keep her on 1/2 or change it back to 1 tablet . Carolyn Newman is showing past due under [facility] .    [facility] Direct Dial [phone]  Fax [phone] [name]

## 2018-04-18 ENCOUNTER — Encounter: Payer: Self-pay | Admitting: Hematology and Oncology

## 2018-04-18 ENCOUNTER — Inpatient Hospital Stay: Payer: Medicare Other | Attending: Hematology and Oncology | Admitting: Medical

## 2018-04-18 ENCOUNTER — Inpatient Hospital Stay: Payer: Medicare Other

## 2018-04-18 ENCOUNTER — Telehealth: Payer: Self-pay

## 2018-04-18 VITALS — BP 125/67 | HR 52 | Temp 97.7°F | Resp 18 | Ht 69.0 in | Wt 276.9 lb

## 2018-04-18 DIAGNOSIS — C9000 Multiple myeloma not having achieved remission: Secondary | ICD-10-CM

## 2018-04-18 DIAGNOSIS — J069 Acute upper respiratory infection, unspecified: Secondary | ICD-10-CM | POA: Diagnosis not present

## 2018-04-18 DIAGNOSIS — R509 Fever, unspecified: Secondary | ICD-10-CM

## 2018-04-18 DIAGNOSIS — R430 Anosmia: Secondary | ICD-10-CM

## 2018-04-18 LAB — CBC WITH DIFFERENTIAL (CANCER CENTER ONLY)
Abs Immature Granulocytes: 0.01 10*3/uL (ref 0.00–0.07)
Basophils Absolute: 0.1 10*3/uL (ref 0.0–0.1)
Basophils Relative: 2 %
Eosinophils Absolute: 0.3 10*3/uL (ref 0.0–0.5)
Eosinophils Relative: 6 %
HCT: 38.2 % (ref 36.0–46.0)
Hemoglobin: 12 g/dL (ref 12.0–15.0)
Immature Granulocytes: 0 %
Lymphocytes Relative: 48 %
Lymphs Abs: 2.1 10*3/uL (ref 0.7–4.0)
MCH: 28.2 pg (ref 26.0–34.0)
MCHC: 31.4 g/dL (ref 30.0–36.0)
MCV: 89.9 fL (ref 80.0–100.0)
Monocytes Absolute: 0.5 10*3/uL (ref 0.1–1.0)
Monocytes Relative: 10 %
Neutro Abs: 1.5 10*3/uL — ABNORMAL LOW (ref 1.7–7.7)
Neutrophils Relative %: 34 %
Platelet Count: 301 10*3/uL (ref 150–400)
RBC: 4.25 MIL/uL (ref 3.87–5.11)
RDW: 15.5 % (ref 11.5–15.5)
WBC Count: 4.4 10*3/uL (ref 4.0–10.5)
nRBC: 0 % (ref 0.0–0.2)

## 2018-04-18 MED ORDER — AZITHROMYCIN 250 MG PO TABS: ORAL_TABLET | ORAL | 0 refills | Status: DC

## 2018-04-18 NOTE — Progress Notes (Signed)
[facility] Progress Note   Carolyn Newman [id number] 02-Feb-1951 67 y.o.  Carolyn Newman is managed by Dr. Heath Lark  Actively treated with chemotherapy/immunotherapy/hormonal therapy: yes  Current Therapy: Pomalyst and Zometa   Assessment: Plan:    Multiple myeloma with failed remission ([place])  Upper respiratory tract infection, unspecified type - Plan: CBC with Differential ([place] Only), azithromycin (ZITHROMAX Z-PAK) 250 MG tablet  Fever, unspecified fever cause - Plan: CBC with Differential ([place] Only), azithromycin (ZITHROMAX Z-PAK) 250 MG tablet  Anosmia   Multiple myeloma with failed remission: The patient was seen last month by Dr. Stacie Glaze at [facility].  She will see Dr. Heath Lark in follow-up on 05/19/2018.   URI with fever: A CBC was collected today.  The patient was given a prescription for a Z-Pak.  Anosmia: The patient is being seen by neurology on 04/27/2018.  Please see After Visit Summary for patient specific instructions.  Future Appointments  Date Time Provider [place]  04/27/2018  9:30 AM Kathrynn Ducking, MD [facility] None  05/19/2018  8:30 AM [facility] ONLY [facility] None  05/19/2018  9:00 AM Heath Lark, MD [facility] None  05/19/2018 10:00 AM [facility] INFUSION [facility] None    Orders Placed This Encounter  Procedures  . CBC with Differential (Cancer Center Only)       Subjective:   Patient ID:  Carolyn Newman is a 67 y.o. (DOB 1950/05/23) female.  Chief Complaint: No chief complaint on file.   HPI Carolyn Newman  Is a 67 year old female with multiple myeloma with failed remission who is managed by Dr. Heath Lark and is treated with Pomalyst and Zometa. Carolyn Newman contacted our office earlier today stating that she had a productive cough, sore throat, chest congestion, fatigue and fever. She had a fever of 102 on Thursday and Friday evening.  She took Aleve. The  remainder of her symptoms with the exception of her fever continue. She coughed up greenish mucous with a fleshy blood tinged component yesterday. She felt like this came from the sinuses. She is seeing neurology on April 27, 2018 for evaluation of her anosmia. She was seen by [facility] with no etiology for her anosmia identified. She inquires if she can get a shingles vaccination when she is better. She is scheduled to see Dr. Alvy Bimler on 05/19/2018.   Medications: I have reviewed the patient's current medications.  Allergies:  Allergies  Allergen Reactions  . Revlimid [Lenalidomide] Rash    Past Medical History:  Diagnosis Date  . Anemia   . Cancer ([place])    myeloma  . Hypertension     Past Surgical History:  Procedure Laterality Date  . EYE SURGERY      Family History  Problem Relation Age of Onset  . Cancer Mother        lymphoma  . Cancer Father        prostate ca    Social History   Socioeconomic History  . Marital status: Single    Spouse name: Not on file  . Number of children: Not on file  . Years of education: Not on file  . Highest education level: Not on file  Occupational History  . Occupation: Optometrist  Social Needs  . Financial resource strain: Not on file  . Food insecurity:    Worry: Not on file    Inability: Not on file  . Transportation needs:    Medical: Not on  file    Non-medical: Not on file  Tobacco Use  . Smoking status: Never Smoker  . Smokeless tobacco: Never Used  Substance and Sexual Activity  . Alcohol use: No  . Drug use: No  . Sexual activity: Not on file  Lifestyle  . Physical activity:    Days per week: Not on file    Minutes per session: Not on file  . Stress: Not on file  Relationships  . Social connections:    Talks on phone: Not on file    Gets together: Not on file    Attends religious service: Not on file    Active member of club or organization: Not on file    Attends meetings of clubs or organizations: Not on  file    Relationship status: Not on file  . Intimate partner violence:    Fear of current or ex partner: Not on file    Emotionally abused: Not on file    Physically abused: Not on file    Forced sexual activity: Not on file  Other Topics Concern  . Not on file  Social History Narrative  . Not on file    Past Medical History, Surgical history, Social history, and Family history were reviewed and updated as appropriate.   Please see review of systems for further details on the patient's review from today.   Review of Systems:  Review of Systems  Constitutional: Positive for fever. Negative for chills, diaphoresis and fatigue.  HENT: Positive for congestion and sore throat. Negative for postnasal drip and rhinorrhea.   Respiratory: Positive for cough. Negative for shortness of breath and wheezing.   Cardiovascular: Negative for palpitations.  Neurological: Negative for headaches.    Objective:   Physical Exam:  BP 125/67 (BP Location: Left Arm, Patient Position: Sitting)   Pulse (!) 52 Comment: RN Samantha aware of Pulse  Temp 97.7 F (36.5 C) (Oral)   Resp 18   Ht _0  (1.753 m)   Wt 276 lb 14.4 oz (125.6 kg)   SpO2 100%   BMI 40.89 kg/m  ECOG: 0  Physical Exam  Constitutional: No distress.  HENT:  Head: Normocephalic and atraumatic.  Right Ear: External ear and ear canal normal.  Left Ear: External ear and ear canal normal. Tympanic membrane is injected.  Nose: Right sinus exhibits no maxillary sinus tenderness and no frontal sinus tenderness. Left sinus exhibits no maxillary sinus tenderness and no frontal sinus tenderness.  Mouth/Throat: Posterior oropharyngeal erythema present. No oropharyngeal exudate.  Neck: Normal range of motion. Neck supple.  Cardiovascular: Normal rate, regular rhythm and normal heart sounds. Exam reveals no gallop and no friction rub.  No murmur heard. Pulmonary/Chest: Effort normal and breath sounds normal. No respiratory distress. She  has no wheezes. She has no rales.  Lymphadenopathy:    She has no cervical adenopathy.  Neurological: She is alert. Coordination normal.  Skin: Skin is warm and dry. No rash noted. She is not diaphoretic. No erythema.  Psychiatric: She has a normal mood and affect. Her behavior is normal. Judgment and thought content normal.    Lab Review:     Component Value Date/Time   NA 140 02/16/2018 0820   NA 142 05/09/2017 0840   K 3.9 02/16/2018 0820   K 4.1 05/09/2017 0840   CL 102 02/16/2018 0820   CO2 28 02/16/2018 0820   CO2 28 05/09/2017 0840   GLUCOSE 87 02/16/2018 0820   GLUCOSE 61 (L) 05/09/2017 0840  BUN 17 02/16/2018 0820   BUN 14.4 05/09/2017 0840   CREATININE 0.92 02/16/2018 0820   CREATININE 0.9 05/09/2017 0840   CALCIUM 9.7 02/16/2018 0820   CALCIUM 9.3 05/09/2017 0840   PROT 7.5 02/16/2018 0820   PROT 6.1 05/09/2017 0840   PROT 6.3 (L) 05/09/2017 0840   ALBUMIN 3.6 02/16/2018 0820   ALBUMIN 3.4 (L) 05/09/2017 0840   AST 22 02/16/2018 0820   AST 20 05/09/2017 0840   ALT 16 02/16/2018 0820   ALT 21 05/09/2017 0840   ALKPHOS 85 02/16/2018 0820   ALKPHOS 81 05/09/2017 0840   BILITOT 1.2 02/16/2018 0820   BILITOT 0.74 05/09/2017 0840   GFRNONAA >60 02/16/2018 0820   GFRAA >60 02/16/2018 0820       Component Value Date/Time   WBC 3.0 (L) 02/16/2018 0820   RBC 4.25 02/16/2018 0820   HGB 12.2 02/16/2018 0820   HGB 11.7 05/09/2017 0840   HCT 37.9 02/16/2018 0820   HCT 37.2 05/09/2017 0840   PLT 206 02/16/2018 0820   PLT 220 05/09/2017 0840   MCV 89.2 02/16/2018 0820   MCV 90.3 05/09/2017 0840   MCH 28.7 02/16/2018 0820   MCHC 32.2 02/16/2018 0820   RDW 16.3 (H) 02/16/2018 0820   RDW 18.2 (H) 05/09/2017 0840   LYMPHSABS 0.9 02/16/2018 0820   LYMPHSABS 1.3 05/09/2017 0840   MONOABS 0.4 02/16/2018 0820   MONOABS 0.5 05/09/2017 0840   EOSABS 0.5 02/16/2018 0820   EOSABS 0.5 05/09/2017 0840   BASOSABS 0.0 02/16/2018 0820   BASOSABS 0.1 05/09/2017 0840    -------------------------------  Imaging from last 24 hours (if applicable):  Radiology interpretation: No results found.

## 2018-04-18 NOTE — Telephone Encounter (Signed)
-----   Message from Heath Lark, MD sent at 04/18/2018  8:53 AM EST ----- Regarding: pls call her See her myChart message We do not base treatment decision on color of mucus If she wants to be evaluated, she can be seen by Lucianne Lei or Urgent care I cannot add her in this week

## 2018-04-18 NOTE — Telephone Encounter (Signed)
Called and given below message. She verbalized understanding. She would like appt with Sandi Mealy, PA. Appt given for 2 pm today. She verbalized understanding. She had a fever over the weekend. Denies fever today. Complaining of hoarseness and sniffles. She has a cough at times.

## 2018-04-18 NOTE — Telephone Encounter (Signed)
Called and left below message. Ask her to call the nurse back at the office.

## 2018-04-27 ENCOUNTER — Encounter: Payer: Self-pay | Admitting: Neurology

## 2018-04-27 ENCOUNTER — Ambulatory Visit (INDEPENDENT_AMBULATORY_CARE_PROVIDER_SITE_OTHER): Payer: Medicare Other | Admitting: Neurology

## 2018-04-27 ENCOUNTER — Encounter

## 2018-04-27 VITALS — BP 119/74 | HR 59 | Ht 69.0 in | Wt 281.0 lb

## 2018-04-27 DIAGNOSIS — G620 Drug-induced polyneuropathy: Secondary | ICD-10-CM | POA: Diagnosis not present

## 2018-04-27 DIAGNOSIS — T451X5A Adverse effect of antineoplastic and immunosuppressive drugs, initial encounter: Secondary | ICD-10-CM

## 2018-04-27 DIAGNOSIS — E538 Deficiency of other specified B group vitamins: Secondary | ICD-10-CM | POA: Diagnosis not present

## 2018-04-27 DIAGNOSIS — R430 Anosmia: Secondary | ICD-10-CM | POA: Diagnosis not present

## 2018-04-27 NOTE — Progress Notes (Signed)
Reason for visit: Anosmia  Referring physician: Dr. Orlie Dakin is a 67 y.o. female  History of present illness:  Ms. Domanski is a 67 year old right-handed black female with a history of multiple myeloma.  The patient has been on Velcade in the past, she currently is on Pomalyst.  The patient developed a peripheral neuropathy initially on Velcade.  She could not tolerate the medication as she developed an allergy.  The patient has been on Pomalyst for at least 1 year.  The patient had been doing quite well but then had sudden onset in May 2019 of loss of sensation of taste.  The patient claims that literally from 1 day to the next she had a change in her sensation.  She reported no intercurrent illness around this time, she did not have a sinus infection, or any other viral illness.  The patient does not smoke cigarettes, she has been seen by Dr. Polly Cobia from ENT, MRI of the brain was done and did not show any sinus disease or evidence of meningioma that would result in anosmia.  The patient recently has had some issues with sinus disease over the last month or 2.  The patient was given a course of Flonase by Dr. Polly Cobia, but this offered no benefit.  The patient reports no other symptoms of headaches, dizziness, vision changes, or difficulty with speech or swallowing.  The patient reports no significant balance changes associated with her peripheral neuropathy, she denies issues controlling the bowels or the bladder but she does have some constipation issues.  She is able to taste basic sensation such as sour or bitter or sweet.  She is sent to this office for an evaluation.  Past Medical History:  Diagnosis Date  . Anemia   . Cancer ([place])    myeloma  . Hypertension   . Neuropathy    Right and left feet     Past Surgical History:  Procedure Laterality Date  . EYE SURGERY      Family History  Problem Relation Age of Onset  . Cancer Mother        lymphoma  . Diabetes  Mother   . Hypertension Mother   . Cancer Father        prostate ca  . Colon cancer Maternal Grandmother   . Diabetes Maternal Grandfather     Social history:  reports that she has never smoked. She has never used smokeless tobacco. She reports that she does not drink alcohol or use drugs.  Medications:  Prior to Admission medications   Medication Sig Start Date End Date Taking? Authorizing Provider  ASPIRIN 81 PO Take 81 mg by mouth daily.   Yes [provider]  bisacodyl (BISACODYL) 5 MG EC tablet Take 5 mg by mouth daily as needed for moderate constipation.   Yes [provider]  Calcium Carbonate-Vitamin D (CALCIUM PLUS VITAMIN D PO) Take 1 tablet daily by mouth. 02/16/17  Yes [provider]  calcium-vitamin D (OSCAL WITH D) 500-200 MG-UNIT tablet Take 2 tablets by mouth daily.   Yes [provider]  Chlorphen-PE-Acetaminophen 4-10-325 MG TABS Take 1 tablet by mouth every 6 (six) hours as needed for allergies.   Yes [provider]  gabapentin (NEURONTIN) 300 MG capsule Take 3 capsules (900 mg total) by mouth 2 (two) times daily. 08/17/17  Yes Gorsuch, Ni, MD  losartan-hydrochlorothiazide (HYZAAR) 100-25 MG tablet Take 1 tablet by mouth daily. Patient taking differently: Take 1 tablet  by mouth daily. TAKES 1/2 TABLET DAILY 04/26/16  Yes Gorsuch, Ernst Spell, MD  Multiple Vitamin (MULTIVITAMIN) tablet Take 1 tablet by mouth daily.   Yes [provider]  ondansetron (ZOFRAN) 8 MG tablet Take 1 tablet (8 mg total) by mouth 2 (two) times daily as needed (Nausea or vomiting). 04/19/16  Yes Gorsuch, Ni, MD  pomalidomide (POMALYST) 2 MG capsule TAKE 1 CAPSULE BY MOUTH ONCE DAILY FOR 21 DAYS ON AND 7 DAYS OFF 04/10/18  Yes Ladell Pier, MD  prochlorperazine (COMPAZINE) 10 MG tablet Take 1 tablet (10 mg total) by mouth every 6 (six) hours as needed (Nausea or vomiting). 04/19/16  Yes Gorsuch, Ni, MD  traMADol (ULTRAM) 50 MG tablet Take 1 tablet (50 mg  total) by mouth every 6 (six) hours as needed. for pain 03/14/18  Yes Gorsuch, Ni, MD  triamcinolone (NASACORT) 55 MCG/ACT AERO nasal inhaler Place 2 sprays into the nose daily. Patient taking differently: Place 2 sprays daily as needed into the nose (allergies). Uses prn 07/02/16  Yes Heath Lark, MD      Allergies  Allergen Reactions  . Revlimid [Lenalidomide] Rash    ROS:  Out of a complete 14 system review of symptoms, the patient complains only of the following symptoms, and all other reviewed systems are negative.  Weight loss Cough Constipation Joint pain, joint swelling Numbness in the feet  Blood pressure 119/74, pulse (!) 59, height _0  (1.753 m), weight 281 lb (127.5 kg).  Physical Exam  General: The patient is alert and cooperative at the time of the examination.  The patient is moderately to markedly obese.  Eyes: Pupils are equal, round, and reactive to light. Discs are flat bilaterally.  Neck: The neck is supple, no carotid bruits are noted.  Respiratory: The respiratory examination is clear.  Cardiovascular: The cardiovascular examination reveals a regular rate and rhythm, no obvious murmurs or rubs are noted.  Skin: Extremities are without significant edema.  Neurologic Exam  Mental status: The patient is alert and oriented x 3 at the time of the examination. The patient has apparent normal recent and remote memory, with an apparently normal attention span and concentration ability.  Cranial nerves: Facial symmetry is present. There is good sensation of the face to pinprick and soft touch bilaterally. The strength of the facial muscles and the muscles to head turning and shoulder shrug are normal bilaterally. Speech is well enunciated, no aphasia or dysarthria is noted. Extraocular movements are full. Visual fields are full. The tongue is midline, and the patient has symmetric elevation of the soft palate. No obvious hearing deficits are noted.  Motor: The  motor testing reveals 5 over 5 strength of all 4 extremities. Good symmetric motor tone is noted throughout.  Sensory: Sensory testing is intact to pinprick, soft touch, vibration sensation, and position sense on the upper extremities.  With the lower extremities, the patient has a stocking pattern pinprick sensory deficit across the ankles bilaterally, she has impairment of vibration sensation and minimal change in position sensation in both feet.  No evidence of extinction is noted.  Coordination: Cerebellar testing reveals good finger-nose-finger and heel-to-shin bilaterally.  Gait and station: Gait is normal. Tandem gait is normal. Romberg is negative. No drift is seen.  Reflexes: Deep tendon reflexes are symmetric, but are depressed bilaterally. Toes are downgoing bilaterally.   MRI brain 12/10/17:  IMPRESSION: 1. Normal appearance of the olfactory bulbs and frontal lobes. No acute or focal abnormality to explain anosmia. 2.  Scattered subcortical white matter changes are mildly advanced for age. The finding is nonspecific but can be seen in the setting of chronic microvascular ischemia, a demyelinating process such as multiple sclerosis, vasculitis, complicated migraine headaches, or as the sequelae of a prior infectious or inflammatory process. 3. No acute abnormality. 4. Minimal sinus disease.  * MRI scan images were reviewed online. I agree with the written report.    Assessment/Plan:  1.  Anosmia, sudden onset  2.  History of multiple myeloma  The patient has an unusual history of a rapid change in her sense of smell that occurred in May 2019, literally overnight.  The patient had no intercurrent illness with a virus or any evidence of sinus disease.  MRI of the brain has been unremarkable.  Certainly chemotherapy agents such as Velcade and her Pomalyst may result in neuropathy issues, but the rapidity of onset of the deficit speaks against these agents as a cause of her  anosmia.  I fear that this issue is not going to be treatable.  The patient will be sent for blood work looking for other metabolic causes of anosmia.  I will call her with the results.  Jill Alexanders MD 04/27/2018 9:28 AM  [facility] [address] [place], [place] [address]  Phone [phone] Fax [phone]

## 2018-04-29 LAB — ANGIOTENSIN CONVERTING ENZYME: Angio Convert Enzyme: 29 U/L (ref 14–82)

## 2018-04-29 LAB — B. BURGDORFI ANTIBODIES: Lyme IgG/IgM Ab: <0.91 {ISR} (ref 0.00–0.90)

## 2018-04-29 LAB — ANA W/REFLEX: Anti Nuclear Antibody(ANA): NEGATIVE

## 2018-04-29 LAB — ZINC: Zinc: 84 ug/dL (ref 56–134)

## 2018-04-29 LAB — COPPER, SERUM: Copper: 132 ug/dL (ref 72–166)

## 2018-04-29 LAB — SEDIMENTATION RATE: Sed Rate: 45 mm/hr — ABNORMAL HIGH (ref 0–40)

## 2018-04-29 LAB — VITAMIN B12: Vitamin B-12: 824 pg/mL (ref 232–1245)

## 2018-04-29 LAB — HIV ANTIBODY (ROUTINE TESTING W REFLEX): HIV Screen 4th Generation wRfx: NONREACTIVE

## 2018-04-29 LAB — TSH: TSH: 2.39 u[IU]/mL (ref 0.450–4.500)

## 2018-05-08 ENCOUNTER — Telehealth: Payer: Self-pay | Admitting: Neurology

## 2018-05-08 ENCOUNTER — Other Ambulatory Visit: Payer: Self-pay

## 2018-05-08 ENCOUNTER — Encounter: Payer: Self-pay | Admitting: Hematology and Oncology

## 2018-05-08 MED ORDER — POMALIDOMIDE 2 MG PO CAPS: ORAL_CAPSULE | ORAL | 11 refills | Status: DC

## 2018-05-08 NOTE — Telephone Encounter (Signed)
I contacted the patient and reviewed blood test results.   Notes recorded by Kathrynn Ducking, MD on 04/30/2018 at 5:23 PM EST The blood work results are unremarkable, with exception that the sedimentation rate is slightly elevated, likely of no clinical concern.  Patient voiced understanding and had no further questions at this time.

## 2018-05-08 NOTE — Telephone Encounter (Signed)
Patient calling to discuss lab results.

## 2018-05-15 ENCOUNTER — Other Ambulatory Visit: Payer: Self-pay | Admitting: Hematology and Oncology

## 2018-05-15 DIAGNOSIS — C9000 Multiple myeloma not having achieved remission: Secondary | ICD-10-CM

## 2018-05-18 ENCOUNTER — Telehealth: Payer: Self-pay | Admitting: *Deleted

## 2018-05-18 ENCOUNTER — Emergency Department (HOSPITAL_COMMUNITY): Payer: Medicare Other

## 2018-05-18 ENCOUNTER — Emergency Department (HOSPITAL_COMMUNITY)
Admission: EM | Admit: 2018-05-18 | Discharge: 2018-05-18 | Disposition: A | Discharge status: Home / Self Care | Payer: Medicare Other | Attending: Emergency Medicine | Admitting: Emergency Medicine

## 2018-05-18 ENCOUNTER — Encounter (HOSPITAL_COMMUNITY): Payer: Self-pay | Admitting: Emergency Medicine

## 2018-05-18 DIAGNOSIS — I10 Essential (primary) hypertension: Secondary | ICD-10-CM | POA: Diagnosis not present

## 2018-05-18 DIAGNOSIS — Z7982 Long term (current) use of aspirin: Secondary | ICD-10-CM | POA: Diagnosis not present

## 2018-05-18 DIAGNOSIS — Z79899 Other long term (current) drug therapy: Secondary | ICD-10-CM | POA: Diagnosis not present

## 2018-05-18 DIAGNOSIS — I712 Thoracic aortic aneurysm, without rupture, unspecified: Secondary | ICD-10-CM

## 2018-05-18 DIAGNOSIS — R0602 Shortness of breath: Secondary | ICD-10-CM | POA: Diagnosis present

## 2018-05-18 DIAGNOSIS — J40 Bronchitis, not specified as acute or chronic: Secondary | ICD-10-CM | POA: Diagnosis not present

## 2018-05-18 LAB — CBC WITH DIFFERENTIAL/PLATELET
Abs Immature Granulocytes: 0.01 10*3/uL (ref 0.00–0.07)
Basophils Absolute: 0.1 10*3/uL (ref 0.0–0.1)
Basophils Relative: 2 %
Eosinophils Absolute: 0.1 10*3/uL (ref 0.0–0.5)
Eosinophils Relative: 2 %
HCT: 43.7 % (ref 36.0–46.0)
Hemoglobin: 13.3 g/dL (ref 12.0–15.0)
Immature Granulocytes: 0 %
Lymphocytes Relative: 46 %
Lymphs Abs: 1.7 10*3/uL (ref 0.7–4.0)
MCH: 27.4 pg (ref 26.0–34.0)
MCHC: 30.4 g/dL (ref 30.0–36.0)
MCV: 89.9 fL (ref 80.0–100.0)
Monocytes Absolute: 0.4 10*3/uL (ref 0.1–1.0)
Monocytes Relative: 12 %
Neutro Abs: 1.3 10*3/uL — ABNORMAL LOW (ref 1.7–7.7)
Neutrophils Relative %: 38 %
Platelets: 283 10*3/uL (ref 150–400)
RBC: 4.86 MIL/uL (ref 3.87–5.11)
RDW: 16.2 % — ABNORMAL HIGH (ref 11.5–15.5)
WBC: 3.5 10*3/uL — ABNORMAL LOW (ref 4.0–10.5)
nRBC: 0 % (ref 0.0–0.2)

## 2018-05-18 LAB — POCT I-STAT, CHEM 8
BUN: 20 mg/dL (ref 8–23)
Calcium, Ion: 1.14 mmol/L — ABNORMAL LOW (ref 1.15–1.40)
Chloride: 97 mmol/L — ABNORMAL LOW (ref 98–111)
Creatinine, Ser: 1 mg/dL (ref 0.44–1.00)
Glucose, Bld: 97 mg/dL (ref 70–99)
HCT: 43 % (ref 36.0–46.0)
Hemoglobin: 14.6 g/dL (ref 12.0–15.0)
Potassium: 4.2 mmol/L (ref 3.5–5.1)
Sodium: 137 mmol/L (ref 135–145)
TCO2: 34 mmol/L — ABNORMAL HIGH (ref 22–32)

## 2018-05-18 MED ORDER — PREDNISONE 20 MG PO TABS
60.0000 mg | ORAL_TABLET | Freq: Once | ORAL | Status: AC
  Administered 2018-05-18: 60 mg via ORAL
  Filled 2018-05-18: qty 3

## 2018-05-18 MED ORDER — IOPAMIDOL (ISOVUE-370) INJECTION 76%
100.0000 mL | Freq: Once | INTRAVENOUS | Status: AC | PRN
  Administered 2018-05-18: 100 mL via INTRAVENOUS

## 2018-05-18 MED ORDER — SODIUM CHLORIDE 0.9 % IV SOLN
INTRAVENOUS | Status: DC
  Administered 2018-05-18: 13:00:00 via INTRAVENOUS

## 2018-05-18 MED ORDER — PREDNISONE 20 MG PO TABS: 20.0000 mg | ORAL_TABLET | Freq: Two times a day (BID) | ORAL | 0 refills | Status: DC

## 2018-05-18 MED ORDER — AEROCHAMBER Z-STAT PLUS/MEDIUM MISC
1.0000 | Freq: Once | Status: AC
  Administered 2018-05-18: 1
  Filled 2018-05-18: qty 1

## 2018-05-18 MED ORDER — ALBUTEROL SULFATE HFA 108 (90 BASE) MCG/ACT IN AERS
2.0000 | INHALATION_SPRAY | Freq: Once | RESPIRATORY_TRACT | Status: AC
  Administered 2018-05-18: 2 via RESPIRATORY_TRACT
  Filled 2018-05-18: qty 6.7

## 2018-05-18 MED ORDER — IOPAMIDOL (ISOVUE-370) INJECTION 76%
INTRAVENOUS | Status: AC
  Filled 2018-05-18: qty 100

## 2018-05-18 MED ORDER — SODIUM CHLORIDE (PF) 0.9 % IJ SOLN
INTRAMUSCULAR | Status: AC
  Filled 2018-05-18: qty 50

## 2018-05-18 MED ORDER — AMOXICILLIN-POT CLAVULANATE 875-125 MG PO TABS: 1.0000 | ORAL_TABLET | Freq: Two times a day (BID) | ORAL | 0 refills | Status: DC

## 2018-05-18 MED ORDER — ALBUTEROL SULFATE (2.5 MG/3ML) 0.083% IN NEBU
5.0000 mg | INHALATION_SOLUTION | Freq: Once | RESPIRATORY_TRACT | Status: AC
  Administered 2018-05-18: 5 mg via RESPIRATORY_TRACT
  Filled 2018-05-18: qty 6

## 2018-05-18 NOTE — ED Provider Notes (Signed)
[facility] DEPT Provider Note   CSN: [id number] Arrival date & time: 05/18/18  1034     History   Chief Complaint Chief Complaint  Patient presents with  . Shortness of Breath  . Cough    HPI Carolyn Newman is a 68 y.o. female.  HPI   She presents for evaluation of cough, for 2 months, productive of sputum.  Sputum production is on and off and sometimes is purulent in appearance.  Shortness of breath is worsening over the last 4 days.  Her oncologist told her she may have a PE so sent her here for testing for that.  No prior history of bronchitis or asthma.  She does not smoke.  She is being treated for multiple myeloma.  She denies weakness or dizziness.  There are no other known modifying factors.  Past Medical History:  Diagnosis Date  . Anemia   . Cancer ([place])    myeloma  . Hypertension   . Neuropathy    Right and left feet     Patient Active Problem List   Diagnosis Date Noted  . Anosmia 11/15/2017  . Morbid obesity with BMI of 40.0-44.9, adult ([place]) 08/18/2017  . Chemotherapy induced neutropenia ([place]) 05/09/2017  . Hypotension due to drugs 04/15/2017  . Hyperglycemia, drug-induced 03/25/2017  . Sinus congestion 03/25/2017  . S/P autologous bone marrow transplantation ([place]) 02/01/2017  . Peripheral neuropathy due to chemotherapy ([place]) 07/13/2016  . Physical debility 07/13/2016  . Allergic rhinitis 07/02/2016  . Rash due to allergy 07/02/2016  . Anemia due to antineoplastic chemotherapy 06/23/2016  . Financial difficulty 05/22/2016  . Pancytopenia, acquired ([place]) 04/19/2016  . Multiple myeloma not having achieved remission ([place]) 04/05/2016  . Multiple myeloma with failed remission ([place]) 04/04/2016  . Normocytic anemia 03/09/2016    Past Surgical History:  Procedure Laterality Date  . EYE SURGERY       OB History   No obstetric history on file.      Home Medications    Prior to Admission medications   Medication  Sig Start Date End Date Taking? Authorizing Provider  ASPIRIN 81 PO Take 81 mg by mouth daily.   Yes [provider]  bisacodyl (BISACODYL) 5 MG EC tablet Take 5 mg by mouth daily as needed for moderate constipation.   Yes [provider]  Calcium Carbonate-Vitamin D (CALCIUM PLUS VITAMIN D PO) Take 1 tablet daily by mouth. 02/16/17  Yes [provider]  calcium-vitamin D (OSCAL WITH D) 500-200 MG-UNIT tablet Take 2 tablets by mouth daily.   Yes [provider]  Chlorphen-PE-Acetaminophen 4-10-325 MG TABS Take 1 tablet by mouth every 6 (six) hours as needed for allergies.   Yes [provider]  gabapentin (NEURONTIN) 300 MG capsule Take 3 capsules (900 mg total) by mouth 2 (two) times daily. Patient taking differently: Take 600 mg by mouth 2 (two) times daily.  08/17/17  Yes Gorsuch, Ni, MD  losartan-hydrochlorothiazide (HYZAAR) 100-25 MG tablet Take 1 tablet by mouth daily. Patient taking differently: Take 0.5 tablets by mouth daily. TAKES 1/2 TABLET DAILY 04/26/16  Yes Gorsuch, Ernst Spell, MD  Multiple Vitamin (MULTIVITAMIN) tablet Take 1 tablet by mouth daily.   Yes [provider]  naproxen sodium (ALEVE) 220 MG tablet Take 440 mg by mouth as needed (leg pain).   Yes [provider]  pomalidomide (POMALYST) 2 MG capsule TAKE 1 CAPSULE BY MOUTH ONCE DAILY FOR 21 DAYS ON AND 7 DAYS OFF Patient taking  differently: Take 2 mg by mouth as directed. TAKE 1 CAPSULE BY MOUTH ONCE DAILY FOR 21 DAYS ON AND 7 DAYS OFF 05/08/18  Yes Gorsuch, Ni, MD  traMADol (ULTRAM) 50 MG tablet Take 1 tablet (50 mg total) by mouth every 6 (six) hours as needed. for pain 03/14/18  Yes Gorsuch, Ni, MD  triamcinolone (NASACORT) 55 MCG/ACT AERO nasal inhaler Place 2 sprays into the nose daily. Patient taking differently: Place 2 sprays daily as needed into the nose (allergies). Uses prn 07/02/16  Yes Gorsuch, Ni, MD  amoxicillin-clavulanate (AUGMENTIN) 875-125 MG tablet Take 1  tablet by mouth 2 (two) times daily. One po bid x 7 days 05/18/18   Daleen Bo, MD  ondansetron (ZOFRAN) 8 MG tablet Take 1 tablet (8 mg total) by mouth 2 (two) times daily as needed (Nausea or vomiting). Patient not taking: Reported on 05/18/2018 04/19/16   Heath Lark, MD  predniSONE (DELTASONE) 20 MG tablet Take 1 tablet (20 mg total) by mouth 2 (two) times daily. 05/18/18   Daleen Bo, MD  prochlorperazine (COMPAZINE) 10 MG tablet Take 1 tablet (10 mg total) by mouth every 6 (six) hours as needed (Nausea or vomiting). Patient not taking: Reported on 05/18/2018 04/19/16   Heath Lark, MD    Family History Family History  Problem Relation Age of Onset  . Cancer Mother        lymphoma  . Diabetes Mother   . Hypertension Mother   . Cancer Father        prostate ca  . Colon cancer Maternal Grandmother   . Diabetes Maternal Grandfather     Social History Social History   Tobacco Use  . Smoking status: Never Smoker  . Smokeless tobacco: Never Used  Substance Use Topics  . Alcohol use: No  . Drug use: No     Allergies   Revlimid [lenalidomide]   Review of Systems Review of Systems  All other systems reviewed and are negative.    Physical Exam Updated Vital Signs BP 104/67   Pulse (!) 59   Temp 98.1 F (36.7 C) (Oral)   Resp (!) 23   SpO2 99%   Physical Exam Vitals signs and nursing note reviewed.  Constitutional:      Appearance: She is well-developed.  HENT:     Head: Normocephalic and atraumatic.     Right Ear: External ear normal.     Left Ear: External ear normal.  Eyes:     Conjunctiva/sclera: Conjunctivae normal.     Pupils: Pupils are equal, round, and reactive to light.  Neck:     Musculoskeletal: Normal range of motion and neck supple.     Trachea: Phonation normal.  Cardiovascular:     Rate and Rhythm: Normal rate and regular rhythm.     Heart sounds: Normal heart sounds.  Pulmonary:     Effort: Pulmonary effort is normal. No tachypnea,  accessory muscle usage or respiratory distress.     Breath sounds: Normal breath sounds. No decreased breath sounds, wheezing, rhonchi or rales.  Abdominal:     Palpations: Abdomen is soft.     Tenderness: There is no abdominal tenderness.  Musculoskeletal: Normal range of motion.     Right lower leg: She exhibits no tenderness. No edema.     Left lower leg: She exhibits no tenderness. No edema.  Skin:    General: Skin is warm and dry.  Neurological:     Mental Status: She is alert and oriented to person,  place, and time.     Cranial Nerves: No cranial nerve deficit.     Sensory: No sensory deficit.     Motor: No abnormal muscle tone.     Coordination: Coordination normal.  Psychiatric:        Behavior: Behavior normal.        Thought Content: Thought content normal.        Judgment: Judgment normal.      ED Treatments / Results  Labs (all labs ordered are listed, but only abnormal results are displayed) Labs Reviewed  CBC WITH DIFFERENTIAL/PLATELET - Abnormal; Notable for the following components:      Result Value   WBC 3.5 (*)    RDW 16.2 (*)    Neutro Abs 1.3 (*)    All other components within normal limits  POCT I-STAT, CHEM 8 - Abnormal; Notable for the following components:   Chloride 97 (*)    Calcium, Ion 1.14 (*)    TCO2 34 (*)    All other components within normal limits  I-STAT CHEM 8, ED    EKG EKG Interpretation  Date/Time:  Thursday May 18 2018 10:47:23 EST Ventricular Rate:  82 PR Interval:    QRS Duration: 92 QT Interval:  380 QTC Calculation: 444 R Axis:   74 Text Interpretation:  Sinus rhythm since last tracing no significant change Confirmed by Daleen Bo [phone]) on 05/18/2018 4:18:30 PM   Radiology Dg Chest 2 View  Result Date: 05/18/2018 CLINICAL DATA:  One-week history of cough and 2 day history of shortness of breath. Nonsmoker. EXAM: CHEST - 2 VIEW COMPARISON:  11/21/2013. FINDINGS: Cardiac silhouette normal in size, unchanged.  Thoracic aorta mildly atherosclerotic, unchanged. Hilar and mediastinal contours otherwise unremarkable. Lungs clear. Bronchovascular markings normal. Pulmonary vascularity normal. No visible pleural effusions. No pneumothorax. Degenerative changes and DISH involving the thoracic spine. No interval change. IMPRESSION: No acute cardiopulmonary disease. Stable examination. Electronically Signed   By: Evangeline Dakin M.D.   On: 05/18/2018 11:32   Ct Angio Chest Pe W/cm &/or Wo Cm  Result Date: 05/18/2018 CLINICAL DATA:  Shortness of breath.  History of multiple myeloma EXAM: CT ANGIOGRAPHY CHEST WITH CONTRAST TECHNIQUE: Multidetector CT imaging of the chest was performed using the standard protocol during bolus administration of intravenous contrast. Multiplanar CT image reconstructions and MIPs were obtained to evaluate the vascular anatomy. CONTRAST:  159m ISOVUE-370 IOPAMIDOL (ISOVUE-370) INJECTION 76% COMPARISON:  Chest radiograph May 18, 2018 FINDINGS: Cardiovascular: There is no demonstrable pulmonary embolus. Ascending thoracic aorta has a transverse diameter of 4.0 x 4.0 cm. There is no thoracic aortic dissection appreciable. Visualized great vessels appear unremarkable. There is aortic atherosclerosis. There is no pericardial effusion or pericardial thickening. Mediastinum/Nodes: Thyroid appears unremarkable. There is no appreciable thoracic adenopathy. No esophageal lesions are evident. Lungs/Pleura: There is atelectatic change in both lung bases. There are areas of patchy opacity in the left lower lobe, suspicious for pneumonia superimposed on atelectatic change. There is mild lower lobe bronchiectatic change bilaterally. The lungs elsewhere are clear. No pleural effusion or pleural thickening evident. Upper Abdomen: Visualized upper abdominal structures appear unremarkable. Musculoskeletal: There are foci of degenerative change in the thoracic spine with diffuse idiopathic skeletal hyperostosis. No  blastic or lytic bone lesions are appreciable on this study. No chest wall lesions evident on this study. Review of the MIP images confirms the above findings. IMPRESSION: 1. No demonstrable pulmonary embolus. Ascending thoracic aorta has a transverse diameter of 4.0 x 4.0 cm. No dissection  demonstrable. There is aortic atherosclerosis. Recommend annual imaging followup by CTA or MRA. This recommendation follows 2010 ACCF/AHA/AATS/ACR/ASA/SCA/SCAI/SIR/STS/SVM Guidelines for the Diagnosis and Management of Patients with Thoracic Aortic Disease. Circulation. 2010; 121: E527-P824. 2. Patchy infiltrate left lower lobe, felt to represent pneumonia. There is also bibasilar atelectatic change. 3.  No demonstrable adenopathy. 4. Diffuse idiopathic skeletal hyperostosis in the thoracic spine. No destructive or lytic bone lesions. Aortic Atherosclerosis (ICD10-I70.0). Electronically Signed   By: Lowella Grip III M.D.   On: 05/18/2018 14:13    Procedures Procedures (including critical care time)  Medications Ordered in ED Medications  0.9 %  sodium chloride infusion ( Intravenous New Bag/Given 05/18/18 1252)  sodium chloride (PF) 0.9 % injection (has no administration in time range)  iopamidol (ISOVUE-370) 76 % injection (has no administration in time range)  albuterol (PROVENTIL HFA;VENTOLIN HFA) 108 (90 Base) MCG/ACT inhaler 2 puff (has no administration in time range)  aerochamber Z-Stat Plus/medium 1 each (has no administration in time range)  albuterol (PROVENTIL) (2.5 MG/3ML) 0.083% nebulizer solution 5 mg (5 mg Nebulization Given 05/18/18 1237)  predniSONE (DELTASONE) tablet 60 mg (60 mg Oral Given 05/18/18 1251)  iopamidol (ISOVUE-370) 76 % injection 100 mL (100 mLs Intravenous Contrast Given 05/18/18 1354)     Initial Impression / Assessment and Plan / ED Course  I have reviewed the triage vital signs and the nursing notes.  Pertinent labs & imaging results that were available during my care of the  patient were reviewed by me and considered in my medical decision making (see chart for details).   Patient Vitals for the past 24 hrs:  BP Temp Temp src Pulse Resp SpO2  05/18/18 1449 104/67 - - (!) 59 (!) 23 99 %  05/18/18 1300 100/84 - - 64 20 98 %  05/18/18 1249 112/80 - - (!) 59 18 97 %  05/18/18 1040 (!) 155/91 98.1 F (36.7 C) Oral 86 18 97 %    4:38 PM Reevaluation with update and discussion. After initial assessment and treatment, an updated evaluation reveals at this time she feels better, lungs with good air movement, few scattered wheezes remain.  Findings discussed with the patient and all questions were answered. Daleen Bo   Medical Decision Making: Cough with signs and symptoms of bronchitis.  Possible early pneumonia however no respiratory distress.  No indication for further ED treatment.  Doubt sepsis or impending vascular collapse.  CRITICAL CARE-no Performed by: Daleen Bo  Nursing Notes Reviewed/ Care Coordinated Applicable Imaging Reviewed Interpretation of Laboratory Data incorporated into ED treatment  The patient appears reasonably screened and/or stabilized for discharge and I doubt any other medical condition or other [facility] requiring further screening, evaluation, or treatment in the ED at this time prior to discharge.  Plan: Home Medications-continue usual medications; Home Treatments-rest, fluids; return here if the recommended treatment, does not improve the symptoms; Recommended follow up-PCP checkup 1 week and as needed   Final Clinical Impressions(s) / ED Diagnoses   Final diagnoses:  Bronchitis  Thoracic aortic aneurysm without rupture [facility])    ED Discharge Orders         Ordered    amoxicillin-clavulanate (AUGMENTIN) 875-125 MG tablet  2 times daily     05/18/18 1633    predniSONE (DELTASONE) 20 MG tablet  2 times daily     05/18/18 1633           Daleen Bo, MD 05/18/18 [phone]

## 2018-05-18 NOTE — Discharge Instructions (Signed)
The testing did not indicate that you have a pulmonary embolus, blood clot at this time.  It appears that you have bronchitis which is an inflammatory or infectious process.  There is a chance that you have a small pneumonia.  We are treating you with an inhaler, prednisone and an antibiotic to improve your symptoms.  Use inhaler 2 puffs every 3-4 hours as needed for cough or trouble breathing.  You can also use Robitussin-DM for cough.  Try to drink plenty of fluids which will help you get better quicker.  Follow-up with your primary care doctor for a checkup.

## 2018-05-18 NOTE — ED Triage Notes (Signed)
Pt c/o cough that started couple months ago that is productive. Pt reports SOB with exertion.

## 2018-05-18 NOTE — Telephone Encounter (Signed)
Patient called with concerns of increase SOB especially last night. Minimally productive cough. She has been battling a sinus cold since October. She has no sense of smell or taste, this is continued since October also. She is SOB more often with activity and now with rest.   She was on antibiotics and completed course. She has not seen her PCP.  Patient is requesting MM panel to be drawn today and possibly to see Dr. Alvy Bimler today instead of tomorrow.  Writer directed patient to ED for further immediate evaluation of these symptoms. Patient verbalized an understanding and states she will head there now.

## 2018-05-19 ENCOUNTER — Telehealth: Payer: Self-pay | Admitting: Hematology and Oncology

## 2018-05-19 ENCOUNTER — Inpatient Hospital Stay: Payer: Medicare Other | Admitting: Hematology and Oncology

## 2018-05-19 ENCOUNTER — Encounter: Payer: Self-pay | Admitting: Hematology and Oncology

## 2018-05-19 ENCOUNTER — Inpatient Hospital Stay: Payer: Medicare Other

## 2018-05-19 ENCOUNTER — Inpatient Hospital Stay: Payer: Medicare Other | Attending: Hematology and Oncology

## 2018-05-19 DIAGNOSIS — J189 Pneumonia, unspecified organism: Secondary | ICD-10-CM

## 2018-05-19 DIAGNOSIS — R430 Anosmia: Secondary | ICD-10-CM | POA: Diagnosis not present

## 2018-05-19 DIAGNOSIS — I712 Thoracic aortic aneurysm, without rupture, unspecified: Secondary | ICD-10-CM

## 2018-05-19 DIAGNOSIS — C9000 Multiple myeloma not having achieved remission: Secondary | ICD-10-CM

## 2018-05-19 LAB — COMPREHENSIVE METABOLIC PANEL
ALT: 16 U/L (ref 0–44)
AST: 25 U/L (ref 15–41)
Albumin: 3.5 g/dL (ref 3.5–5.0)
Alkaline Phosphatase: 85 U/L (ref 38–126)
Anion gap: 13 (ref 5–15)
BUN: 18 mg/dL (ref 8–23)
CO2: 25 mmol/L (ref 22–32)
Calcium: 9.2 mg/dL (ref 8.9–10.3)
Chloride: 101 mmol/L (ref 98–111)
Creatinine, Ser: 1.05 mg/dL — ABNORMAL HIGH (ref 0.44–1.00)
GFR calc Af Amer: >60 mL/min (ref >60)
GFR calc non Af Amer: 55 mL/min — ABNORMAL LOW (ref >60)
Glucose, Bld: 79 mg/dL (ref 70–99)
Potassium: 3.3 mmol/L — ABNORMAL LOW (ref 3.5–5.1)
Sodium: 139 mmol/L (ref 135–145)
Total Bilirubin: 0.4 mg/dL (ref 0.3–1.2)
Total Protein: 7.5 g/dL (ref 6.5–8.1)

## 2018-05-19 LAB — CBC WITH DIFFERENTIAL/PLATELET
Abs Immature Granulocytes: 0.01 10*3/uL (ref 0.00–0.07)
Basophils Absolute: 0 10*3/uL (ref 0.0–0.1)
Basophils Relative: 1 %
Eosinophils Absolute: 0 10*3/uL (ref 0.0–0.5)
Eosinophils Relative: 0 %
HCT: 38.4 % (ref 36.0–46.0)
Hemoglobin: 12.2 g/dL (ref 12.0–15.0)
Immature Granulocytes: 0 %
Lymphocytes Relative: 31 %
Lymphs Abs: 1.1 10*3/uL (ref 0.7–4.0)
MCH: 28 pg (ref 26.0–34.0)
MCHC: 31.8 g/dL (ref 30.0–36.0)
MCV: 88.3 fL (ref 80.0–100.0)
Monocytes Absolute: 0.6 10*3/uL (ref 0.1–1.0)
Monocytes Relative: 16 %
Neutro Abs: 1.8 10*3/uL (ref 1.7–7.7)
Neutrophils Relative %: 52 %
Platelets: 268 10*3/uL (ref 150–400)
RBC: 4.35 MIL/uL (ref 3.87–5.11)
RDW: 16 % — ABNORMAL HIGH (ref 11.5–15.5)
WBC: 3.5 10*3/uL — ABNORMAL LOW (ref 4.0–10.5)
nRBC: 0 % (ref 0.0–0.2)

## 2018-05-19 MED ORDER — ZOLEDRONIC ACID 4 MG/100ML IV SOLN
4.0000 mg | Freq: Once | INTRAVENOUS | Status: AC
  Administered 2018-05-19: 4 mg via INTRAVENOUS
  Filled 2018-05-19: qty 100

## 2018-05-19 MED ORDER — SODIUM CHLORIDE 0.9 % IV SOLN
Freq: Once | INTRAVENOUS | Status: AC
  Administered 2018-05-19: 11:00:00 via INTRAVENOUS
  Filled 2018-05-19: qty 250

## 2018-05-19 NOTE — Assessment & Plan Note (Signed)
I have reviewed recent CT imaging which show evidence of thoracic aortic aneurysm. We discussed risk factor modification I will refer her to see cardiologist for further management

## 2018-05-19 NOTE — Assessment & Plan Note (Signed)
She continues to have difficulties with anosmia She has been seen by ENT and neurologist with negative imaging study At this point, I do not have anything to offer.

## 2018-05-19 NOTE — Assessment & Plan Note (Addendum)
Due to recent infection, I recommend her to stop Pomalyst for a week until she is fully recovered from infection After that, she will resume Pomalyst She will continue aspirin for DVT prophylaxis She will continue calcium with vitamin D and Zometa every 3 months, due today

## 2018-05-19 NOTE — Progress Notes (Signed)
[place] OFFICE PROGRESS NOTE  Patient Care Team: Lucianne Lei, MD as PCP - General (Family Medicine) Jodi Marble, MD as Consulting Physician (Otolaryngology) Heath Lark, MD as Consulting Physician (Hematology and Oncology)  ASSESSMENT & PLAN:  Multiple myeloma with failed remission [facility]) Due to recent infection, I recommend her to stop Pomalyst for a week until she is fully recovered from infection After that, she will resume Pomalyst She will continue aspirin for DVT prophylaxis She will continue calcium with vitamin D and Zometa every 3 months, due today  Thoracic aortic aneurysm without rupture ([place]) She has some cardiovascular risk factors including hyperlipidemia, morbid obesity and hypertension Her blood pressures well controlled I recommend follow-up with cardiologist for further management  Aortic aneurysm without rupture ([place]) I have reviewed recent CT imaging which show evidence of thoracic aortic aneurysm. We discussed risk factor modification I will refer her to see cardiologist for further management  Community acquired pneumonia She felt better since yesterday She will continue prescribed antibiotics treatment along with steroids As above, we will hold chemotherapy for 1 week  Anosmia She continues to have difficulties with anosmia She has been seen by ENT and neurologist with negative imaging study At this point, I do not have anything to offer.   Orders Placed This Encounter  Procedures  . Ambulatory referral to Cardiology    Referral Priority:   Routine    Referral Type:   Consultation    Referral Reason:   Specialty Services Required    Requested Specialty:   Cardiology    Number of Visits Requested:   1    INTERVAL HISTORY: Please see below for problem oriented charting. She returns for further follow-up She went to the ER yesterday when she started to have hemoptysis CT imaging show evidence of an aneurysm along with signs of  pneumonia She was prescribed antibiotic treatment along with corticosteroid therapy and inhalers She is still disturbed by the lack of smell She has no further bleeding since yesterday No recent changes in bowel habits She has some occasional minor tingling sensation from prior neuropathy, stable She has no recent dental issues  SUMMARY OF ONCOLOGIC HISTORY:   Multiple myeloma with failed remission ([place])   03/22/2016 Bone Marrow Biopsy    Bone Marrow Biopsy: Plasma cells 17% by Aspirate and 30% by CD 138 stain. Plasma cell neoplasm, Kappa Restricted Normal cytogenetics, FISH positive for +11 and +14 and +7    04/13/2016 Imaging    Skeletal survey showed lucencies within the calvarium worrisome for myeloma. No definite abnormal lytic or blastic lesions are observed elsewhere.    04/19/2016 - 09/10/2016 Chemotherapy    The patient consented to treatment with Revlimid, dexamethasone and Velcade    09/14/2016 Bone Marrow Biopsy    In summary, there is a normal cellular marrow with trilineage hematopoiesis.A mild plasmacytosis is present, but there is no definitive evidence of residual multiple myeloma    10/12/2016 - 10/12/2016 Chemotherapy    She received melphalan as conditioning chemo    10/13/2016 Bone Marrow Transplant    She received autologous stem cell transplant at [facility]    01/24/2017 Bone Marrow Biopsy    BONE MARROW: -Normocellular marrow for age (50%) with trilineage hematopoiesis. -No morphologic or immunohistochemical evidence of involvement by plasma cell neoplasm (see comment)  PERIPHERAL BLOOD: -Unremarkable (see CBC data)  COMMENT: The bone marrow is normocellular for age and shows adequate trilineage hematopoiesis without significant (<10%) dysplastic changes present. Blasts are not  increased. Plasma cells represent about 2% of total cells on aspirate smears. Appropriately controlled immunohistochemical stains are performed on the core  biopsy. CD138 highlights small interstitial plasma cells comprising approximately 2% of total cells. These plasma cells appear polytypic as demonstrated by kappa and lambda in-situ hybridization.    01/25/2017 PET scan    No FDG avid osseous lesions or masses are identified.    02/18/2017 - 04/01/2017 Chemotherapy    She received weekly Velcade, Pomalyst and Dexamethasone    04/15/2017 -  Chemotherapy    The patient had Pomalyst only    04/20/2017 Bone Marrow Biopsy    Bone Marrow (BM) and Peripheral Blood (PB) FINAL PATHOLOGIC DIAGNOSIS BONE MARROW: Normocellular bone marrow (40%) with normal numbers of megakaryocytes, erythroid hyperplasia and no increase in plasma cells (1%).     REVIEW OF SYSTEMS:   Constitutional: Denies fevers, chills or abnormal weight loss Eyes: Denies blurriness of vision Ears, nose, mouth, throat, and face: Denies mucositis or sore throat Cardiovascular: Denies palpitation, chest discomfort or lower extremity swelling Gastrointestinal:  Denies nausea, heartburn or change in bowel habits Skin: Denies abnormal skin rashes Lymphatics: Denies new lymphadenopathy or easy bruising Neurological:Denies numbness, tingling or new weaknesses Behavioral/Psych: Mood is stable, no new changes  All other systems were reviewed with the patient and are negative.  I have reviewed the past medical history, past surgical history, social history and family history with the patient and they are unchanged from previous note.  ALLERGIES:  is allergic to revlimid [lenalidomide].  MEDICATIONS:  Current Outpatient Medications  Medication Sig Dispense Refill  . amoxicillin-clavulanate (AUGMENTIN) 875-125 MG tablet Take 1 tablet by mouth 2 (two) times daily. One po bid x 7 days 14 tablet 0  . ASPIRIN 81 PO Take 81 mg by mouth daily.    . bisacodyl (BISACODYL) 5 MG EC tablet Take 5 mg by mouth daily as needed for moderate constipation.    . Calcium Carbonate-Vitamin D (CALCIUM PLUS  VITAMIN D PO) Take 1 tablet daily by mouth.    . calcium-vitamin D (OSCAL WITH D) 500-200 MG-UNIT tablet Take 2 tablets by mouth daily.    . Chlorphen-PE-Acetaminophen 4-10-325 MG TABS Take 1 tablet by mouth every 6 (six) hours as needed for allergies.    Marland Kitchen gabapentin (NEURONTIN) 300 MG capsule Take 3 capsules (900 mg total) by mouth 2 (two) times daily. (Patient taking differently: Take 600 mg by mouth 2 (two) times daily. ) 540 capsule 3  . losartan-hydrochlorothiazide (HYZAAR) 100-25 MG tablet Take 1 tablet by mouth daily. (Patient taking differently: Take 0.5 tablets by mouth daily. TAKES 1/2 TABLET DAILY) 90 tablet 3  . Multiple Vitamin (MULTIVITAMIN) tablet Take 1 tablet by mouth daily.    . naproxen sodium (ALEVE) 220 MG tablet Take 440 mg by mouth as needed (leg pain).    . ondansetron (ZOFRAN) 8 MG tablet Take 1 tablet (8 mg total) by mouth 2 (two) times daily as needed (Nausea or vomiting). (Patient not taking: Reported on 05/18/2018) 30 tablet 1  . pomalidomide (POMALYST) 2 MG capsule TAKE 1 CAPSULE BY MOUTH ONCE DAILY FOR 21 DAYS ON AND 7 DAYS OFF (Patient taking differently: Take 2 mg by mouth as directed. TAKE 1 CAPSULE BY MOUTH ONCE DAILY FOR 21 DAYS ON AND 7 DAYS OFF) 21 capsule 11  . predniSONE (DELTASONE) 20 MG tablet Take 1 tablet (20 mg total) by mouth 2 (two) times daily. 10 tablet 0  . prochlorperazine (COMPAZINE) 10 MG tablet Take 1  tablet (10 mg total) by mouth every 6 (six) hours as needed (Nausea or vomiting). (Patient not taking: Reported on 05/18/2018) 30 tablet 1  . traMADol (ULTRAM) 50 MG tablet Take 1 tablet (50 mg total) by mouth every 6 (six) hours as needed. for pain 90 tablet 0  . triamcinolone (NASACORT) 55 MCG/ACT AERO nasal inhaler Place 2 sprays into the nose daily. (Patient taking differently: Place 2 sprays daily as needed into the nose (allergies). Uses prn) 1 Inhaler 12   No current facility-administered medications for this visit.     PHYSICAL  EXAMINATION: ECOG PERFORMANCE STATUS: 1 - Symptomatic but completely ambulatory  Vitals:   05/19/18 0915  BP: 114/68  Pulse: 83  Resp: 17  Temp: (!) 97.5 F (36.4 C)  SpO2: 100%   Filed Weights   05/19/18 0915  Weight: 276 lb 3.2 oz (125.3 kg)    GENERAL:alert, no distress and comfortable SKIN: skin color, texture, turgor are normal, no rashes or significant lesions EYES: normal, Conjunctiva are pink and non-injected, sclera clear OROPHARYNX:no exudate, no erythema and lips, buccal mucosa, and tongue normal  NECK: supple, thyroid normal size, non-tender, without nodularity LYMPH:  no palpable lymphadenopathy in the cervical, axillary or inguinal LUNGS: clear to auscultation and percussion with normal breathing effort HEART: regular rate & rhythm and no murmurs and no lower extremity edema ABDOMEN:abdomen soft, non-tender and normal bowel sounds Musculoskeletal:no cyanosis of digits and no clubbing  NEURO: alert & oriented x 3 with fluent speech, no focal motor/sensory deficits  LABORATORY DATA:  I have reviewed the data as listed    Component Value Date/Time   NA 139 05/19/2018 0856   NA 142 05/09/2017 0840   K 3.3 (L) 05/19/2018 0856   K 4.1 05/09/2017 0840   CL 101 05/19/2018 0856   CO2 25 05/19/2018 0856   CO2 28 05/09/2017 0840   GLUCOSE 79 05/19/2018 0856   GLUCOSE 61 (L) 05/09/2017 0840   BUN 18 05/19/2018 0856   BUN 14.4 05/09/2017 0840   CREATININE 1.05 (H) 05/19/2018 0856   CREATININE 0.9 05/09/2017 0840   CALCIUM 9.2 05/19/2018 0856   CALCIUM 9.3 05/09/2017 0840   PROT 7.5 05/19/2018 0856   PROT 6.1 05/09/2017 0840   PROT 6.3 (L) 05/09/2017 0840   ALBUMIN 3.5 05/19/2018 0856   ALBUMIN 3.4 (L) 05/09/2017 0840   AST 25 05/19/2018 0856   AST 20 05/09/2017 0840   ALT 16 05/19/2018 0856   ALT 21 05/09/2017 0840   ALKPHOS 85 05/19/2018 0856   ALKPHOS 81 05/09/2017 0840   BILITOT 0.4 05/19/2018 0856   BILITOT 0.74 05/09/2017 0840   GFRNONAA 55 (L)  05/19/2018 0856   GFRAA >60 05/19/2018 0856    No results found for: SPEP, UPEP  Lab Results  Component Value Date   WBC 3.5 (L) 05/19/2018   NEUTROABS 1.8 05/19/2018   HGB 12.2 05/19/2018   HCT 38.4 05/19/2018   MCV 88.3 05/19/2018   PLT 268 05/19/2018      Chemistry      Component Value Date/Time   NA 139 05/19/2018 0856   NA 142 05/09/2017 0840   K 3.3 (L) 05/19/2018 0856   K 4.1 05/09/2017 0840   CL 101 05/19/2018 0856   CO2 25 05/19/2018 0856   CO2 28 05/09/2017 0840   BUN 18 05/19/2018 0856   BUN 14.4 05/09/2017 0840   CREATININE 1.05 (H) 05/19/2018 0856   CREATININE 0.9 05/09/2017 0840      Component  Value Date/Time   CALCIUM 9.2 05/19/2018 0856   CALCIUM 9.3 05/09/2017 0840   ALKPHOS 85 05/19/2018 0856   ALKPHOS 81 05/09/2017 0840   AST 25 05/19/2018 0856   AST 20 05/09/2017 0840   ALT 16 05/19/2018 0856   ALT 21 05/09/2017 0840   BILITOT 0.4 05/19/2018 0856   BILITOT 0.74 05/09/2017 0840       RADIOGRAPHIC STUDIES: I have personally reviewed the radiological images as listed and agreed with the findings in the report. Dg Chest 2 View  Result Date: 05/18/2018 CLINICAL DATA:  One-week history of cough and 2 day history of shortness of breath. Nonsmoker. EXAM: CHEST - 2 VIEW COMPARISON:  11/21/2013. FINDINGS: Cardiac silhouette normal in size, unchanged. Thoracic aorta mildly atherosclerotic, unchanged. Hilar and mediastinal contours otherwise unremarkable. Lungs clear. Bronchovascular markings normal. Pulmonary vascularity normal. No visible pleural effusions. No pneumothorax. Degenerative changes and DISH involving the thoracic spine. No interval change. IMPRESSION: No acute cardiopulmonary disease. Stable examination. Electronically Signed   By: Evangeline Dakin M.D.   On: 05/18/2018 11:32   Ct Angio Chest Pe W/cm &/or Wo Cm  Result Date: 05/18/2018 CLINICAL DATA:  Shortness of breath.  History of multiple myeloma EXAM: CT ANGIOGRAPHY CHEST WITH CONTRAST  TECHNIQUE: Multidetector CT imaging of the chest was performed using the standard protocol during bolus administration of intravenous contrast. Multiplanar CT image reconstructions and MIPs were obtained to evaluate the vascular anatomy. CONTRAST:  173m ISOVUE-370 IOPAMIDOL (ISOVUE-370) INJECTION 76% COMPARISON:  Chest radiograph May 18, 2018 FINDINGS: Cardiovascular: There is no demonstrable pulmonary embolus. Ascending thoracic aorta has a transverse diameter of 4.0 x 4.0 cm. There is no thoracic aortic dissection appreciable. Visualized great vessels appear unremarkable. There is aortic atherosclerosis. There is no pericardial effusion or pericardial thickening. Mediastinum/Nodes: Thyroid appears unremarkable. There is no appreciable thoracic adenopathy. No esophageal lesions are evident. Lungs/Pleura: There is atelectatic change in both lung bases. There are areas of patchy opacity in the left lower lobe, suspicious for pneumonia superimposed on atelectatic change. There is mild lower lobe bronchiectatic change bilaterally. The lungs elsewhere are clear. No pleural effusion or pleural thickening evident. Upper Abdomen: Visualized upper abdominal structures appear unremarkable. Musculoskeletal: There are foci of degenerative change in the thoracic spine with diffuse idiopathic skeletal hyperostosis. No blastic or lytic bone lesions are appreciable on this study. No chest wall lesions evident on this study. Review of the MIP images confirms the above findings. IMPRESSION: 1. No demonstrable pulmonary embolus. Ascending thoracic aorta has a transverse diameter of 4.0 x 4.0 cm. No dissection demonstrable. There is aortic atherosclerosis. Recommend annual imaging followup by CTA or MRA. This recommendation follows 2010 ACCF/AHA/AATS/ACR/ASA/SCA/SCAI/SIR/STS/SVM Guidelines for the Diagnosis and Management of Patients with Thoracic Aortic Disease. Circulation. 2010; 121: eF810-F751 2. Patchy infiltrate left lower  lobe, felt to represent pneumonia. There is also bibasilar atelectatic change. 3.  No demonstrable adenopathy. 4. Diffuse idiopathic skeletal hyperostosis in the thoracic spine. No destructive or lytic bone lesions. Aortic Atherosclerosis (ICD10-I70.0). Electronically Signed   By: WLowella GripIII M.D.   On: 05/18/2018 14:13    All questions were answered. The patient knows to call the clinic with any problems, questions or concerns. No barriers to learning was detected.  I spent 25 minutes counseling the patient face to face. The total time spent in the appointment was 30 minutes and more than 50% was on counseling and review of test results  NHeath Lark MD 05/19/2018 3:57 PM

## 2018-05-19 NOTE — Assessment & Plan Note (Signed)
She has some cardiovascular risk factors including hyperlipidemia, morbid obesity and hypertension Her blood pressures well controlled I recommend follow-up with cardiologist for further management

## 2018-05-19 NOTE — Telephone Encounter (Signed)
Gave avs and calendar ° °

## 2018-05-19 NOTE — Patient Instructions (Signed)

## 2018-05-19 NOTE — Assessment & Plan Note (Signed)
She felt better since yesterday She will continue prescribed antibiotics treatment along with steroids As above, we will hold chemotherapy for 1 week

## 2018-05-22 LAB — MULTIPLE MYELOMA PANEL, SERUM
Albumin SerPl Elph-Mcnc: 3.8 g/dL (ref 2.9–4.4)
Albumin/Glob SerPl: 1.2 (ref 0.7–1.7)
Alpha 1: 0.2 g/dL (ref 0.0–0.4)
Alpha2 Glob SerPl Elph-Mcnc: 0.9 g/dL (ref 0.4–1.0)
B-Globulin SerPl Elph-Mcnc: 1.1 g/dL (ref 0.7–1.3)
Gamma Glob SerPl Elph-Mcnc: 1.2 g/dL (ref 0.4–1.8)
Globulin, Total: 3.3 g/dL (ref 2.2–3.9)
IgA: 288 mg/dL (ref 87–352)
IgG (Immunoglobin G), Serum: 1303 mg/dL (ref 700–1600)
IgM (Immunoglobulin M), Srm: 32 mg/dL (ref 26–217)
Total Protein ELP: 7.1 g/dL (ref 6.0–8.5)

## 2018-05-22 LAB — KAPPA/LAMBDA LIGHT CHAINS
Kappa free light chain: 47.5 mg/L — ABNORMAL HIGH (ref 3.3–19.4)
Kappa, lambda light chain ratio: 1.73 — ABNORMAL HIGH (ref 0.26–1.65)
Lambda free light chains: 27.4 mg/L — ABNORMAL HIGH (ref 5.7–26.3)

## 2018-05-23 ENCOUNTER — Telehealth: Payer: Self-pay

## 2018-05-23 NOTE — Telephone Encounter (Signed)
Called and left below message. Ask her to call the office back with how she is doing.

## 2018-05-23 NOTE — Telephone Encounter (Signed)
-----   Message from Heath Lark, MD sent at 05/23/2018  9:19 AM EST ----- Regarding: CV consult Can you call her and ask how she is doing? Her myeloma labs showed remission Also, can you check status of CV referral?

## 2018-05-23 NOTE — Telephone Encounter (Signed)
She called back and left a message. She is feeling better and her bronchitis is improving. Still struggling with the smell and taste is issues. Thanks for the good news.  Called back and given [place] [facility] at [facility] phone # to make cardiologist appt. I called earlier and they said she can call and make appt. She verbalized understanding.

## 2018-05-26 ENCOUNTER — Telehealth: Payer: Self-pay | Admitting: *Deleted

## 2018-05-26 NOTE — Telephone Encounter (Signed)
Patient called with continued concerns of cough from her recent diagnosis of Bronchitis. Discussed symptom management. She has completed antibiotic course and continues inhaler use. Patient advised to follow up with PCP for further management if needed.

## 2018-05-31 ENCOUNTER — Other Ambulatory Visit: Payer: Self-pay | Admitting: Hematology and Oncology

## 2018-06-07 ENCOUNTER — Encounter: Payer: Self-pay | Admitting: Hematology and Oncology

## 2018-06-20 ENCOUNTER — Ambulatory Visit: Payer: Medicare Other | Admitting: Cardiovascular Disease

## 2018-06-22 ENCOUNTER — Encounter: Payer: Self-pay | Admitting: Medical

## 2018-06-23 ENCOUNTER — Encounter: Payer: Self-pay | Admitting: Cardiovascular Disease

## 2018-06-23 ENCOUNTER — Ambulatory Visit: Payer: Medicare Other | Admitting: Cardiovascular Disease

## 2018-06-23 VITALS — BP 112/88 | HR 90 | Ht 69.0 in | Wt 280.0 lb

## 2018-06-23 DIAGNOSIS — E785 Hyperlipidemia, unspecified: Secondary | ICD-10-CM | POA: Insufficient documentation

## 2018-06-23 DIAGNOSIS — I712 Thoracic aortic aneurysm, without rupture, unspecified: Secondary | ICD-10-CM

## 2018-06-23 DIAGNOSIS — Q231 Congenital insufficiency of aortic valve: Secondary | ICD-10-CM | POA: Diagnosis not present

## 2018-06-23 DIAGNOSIS — E782 Mixed hyperlipidemia: Secondary | ICD-10-CM

## 2018-06-23 NOTE — Assessment & Plan Note (Signed)
History of hyperlipidemia not on statin therapy with lipid profile performed 04/02/2017 revealing total cholesterol 259, LDL 159 and HDL of 84.  We will provide her with an AHA low-cholesterol diet.

## 2018-06-23 NOTE — Patient Instructions (Addendum)
Medication Instructions:  NONE If you need a refill on your cardiac medications before your next appointment, please call your pharmacy.   Lab work: NONE If you have labs (blood work) drawn today and your tests are completely normal, you will receive your results only by: Marland Kitchen MyChart Message (if you have MyChart) OR . A paper copy in the mail If you have any lab test that is abnormal or we need to change your treatment, we will call you to review the results.  Testing/Procedures: Your physician has requested that you have an echocardiogram. Echocardiography is a painless test that uses sound waves to create images of your heart. It provides your doctor with information about the size and shape of your heart and how well your heart's chambers and valves are working. This procedure takes approximately one hour. There are no restrictions for this procedure.  SCHEDULE NEXT AVAILABLE APPOINTMENT  Your physician has requested that you have  Non-Cardiac CT Angiography (CTA). It is a special type of CT scan that uses a computer to produce multi-dimensional views of major blood vessels throughout the body. In CT angiography, a contrast material is injected through an IV to help visualize the blood vessels.  SCHEDULE ON AN ANNUAL BASIS; February 2021    Follow-Up: At [facility], you and your health needs are our priority.  As part of our continuing mission to provide you with exceptional heart care, we have created designated Provider Care Teams.  These Care Teams include your primary Cardiologist (physician) and Advanced Practice Providers (APPs -  Physician Assistants and Nurse Practitioners) who all work together to provide you with the care you need, when you need it. . You will need a follow up appointment in 12 months.  Please call our office 2 months in advance to schedule this appointment.  You may see Dr. Gwenlyn Found or one of the following Advanced Practice Providers on your designated Care Team:    . Kerin Ransom, [place] . Almyra Deforest, PA-C . Fabian Sharp, PA-C . Jory Sims, DNP . Rosaria Ferries, PA-C . Roby Lofts, PA-C . Sande Rives, PA-C    Cholesterol  Cholesterol is a fat. Your body needs a small amount of cholesterol. Cholesterol (plaque) may build up in your blood vessels (arteries). That makes you more likely to have a heart attack or stroke. You cannot feel your cholesterol level. Having a blood test is the only way to find out if your level is high. Keep your test results. Work with your doctor to keep your cholesterol at a good level. What do the results mean?  Total cholesterol is how much cholesterol is in your blood.  LDL is bad cholesterol. This is the type that can build up. Try to have low LDL.  HDL is good cholesterol. It cleans your blood vessels and carries LDL away. Try to have high HDL.  Triglycerides are fat that the body can store or burn for energy. What are good levels of cholesterol?  Total cholesterol below 200.  LDL below 100 is good for people who have health risks. LDL below 70 is good for people who have very high risks.  HDL above 40 is good. It is best to have HDL of 60 or higher.  Triglycerides below 150. How can I lower my cholesterol? Diet Follow your diet program as told by your doctor.  Choose fish, white meat chicken, or [place] that is roasted or baked. Try not to eat red meat, fried foods, sausage, or lunch  meats.  Eat lots of fresh fruits and vegetables.  Choose whole grains, beans, pasta, potatoes, and cereals.  Choose olive oil, corn oil, or canola oil. Only use small amounts.  Try not to eat butter, mayonnaise, shortening, or palm kernel oils.  Try not to eat foods with trans fats.  Choose low-fat or nonfat dairy foods. ? Drink skim or nonfat milk. ? Eat low-fat or nonfat yogurt and cheeses. ? Try not to drink whole milk or cream. ? Try not to eat ice cream, egg yolks, or full-fat cheeses.  Healthy  desserts include angel food cake, ginger snaps, animal crackers, hard candy, popsicles, and low-fat or nonfat frozen yogurt. Try not to eat pastries, cakes, pies, and cookies.  Exercise Follow your exercise program as told by your doctor.  Be more active. Try gardening, walking, and taking the stairs.  Ask your doctor about ways that you can be more active. Medicine  Take over-the-counter and prescription medicines only as told by your doctor. This information is not intended to replace advice given to you by your health care provider. Make sure you discuss any questions you have with your health care provider. Document Released: 07/30/2008 Document Revised: 12/03/2015 Document Reviewed: 11/13/2015 Elsevier Interactive Patient Education  2019 [employer].

## 2018-06-23 NOTE — Assessment & Plan Note (Signed)
History of a small thoracic aortic aneurysm found on CT scanning performed 05/18/2018 measuring 4 x 4 cm.  There is no mention of coronary atherosclerosis.  We will get a CTA on annual basis to follow this.

## 2018-06-23 NOTE — Progress Notes (Signed)
06/23/2018 Carolyn Newman   [birth year]  [id number]  Primary Physician Lucianne Lei, MD Primary Cardiologist: Lorretta Harp MD Lupe Carney, [place]  HPI:  Carolyn Newman is a 68 y.o. 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  She was referred by Dr. Alvy Bimler for cardiovascular evaluation because of a recently documented small thoracic aortic aneurysm on chest CT performed 05/18/2018.  Her risk factors include mild untreated hyperlipidemia.  She does not smoke.  She is not diabetic.  She is never had a heart attack or stroke.  There is no family history for heart disease.  She denies chest pain or shortness of breath.  She does have multiple myeloma diagnosed in 2017 and had a stem cell transplant and is now in remission.  Because of an upper respiratory tract infection and coughing she was seen in the emergency room on 05/18/2018 and had a chest CT that showed a small thoracic aortic aneurysm measuring 4 cm x 4 cm.   Current Meds  Medication Sig  . ASPIRIN 81 PO Take 81 mg by mouth daily.  . bisacodyl (DULCOLAX) 5 MG EC tablet Take 5 mg by mouth daily as needed for moderate constipation.  . Calcium Carbonate-Vitamin D (CALCIUM PLUS VITAMIN D PO) Take 1 tablet daily by mouth.  . calcium-vitamin D (OSCAL WITH D) 500-200 MG-UNIT tablet Take 2 tablets by mouth daily.  Marland Kitchen gabapentin (NEURONTIN) 300 MG capsule Take 3 capsules (900 mg total) by mouth 2 (two) times daily. (Patient taking differently: Take 600 mg by mouth 2 (two) times daily. )  . losartan-hydrochlorothiazide (HYZAAR) 100-25 MG tablet Take 1 tablet by mouth daily. (Patient taking differently: Take 0.5 tablets by mouth daily. TAKES 1/2 TABLET DAILY)  . Multiple Vitamin (MULTIVITAMIN) tablet Take 1 tablet by mouth daily.  . naproxen sodium (ALEVE) 220 MG tablet Take 440 mg by mouth as needed (leg pain).  Marland Kitchen POMALYST 2 MG capsule TAKE 1  CAPSULE BY MOUTH ONCE DAILY FOR 21 DAYS ON AND 7 DAYS OFF  . [DISCONTINUED] ondansetron (ZOFRAN) 8 MG tablet Take 1 tablet (8 mg total) by mouth 2 (two) times daily as needed (Nausea or vomiting).  . [DISCONTINUED] prochlorperazine (COMPAZINE) 10 MG tablet Take 1 tablet (10 mg total) by mouth every 6 (six) hours as needed (Nausea or vomiting).     Allergies  Allergen Reactions  . Revlimid [Lenalidomide] Rash    Social History   Socioeconomic History  . Marital status: Single    Spouse name: Not on file  . Number of children: Not on file  . Years of education: Not on file  . Highest education level: Not on file  Occupational History  . Occupation: Optometrist  Social Needs  . Financial resource strain: Not on file  . Food insecurity:    Worry: Not on file    Inability: Not on file  . Transportation needs:    Medical: Not on file    Non-medical: Not on file  Tobacco Use  . Smoking status: Never Smoker  . Smokeless tobacco: Never Used  Substance and Sexual Activity  . Alcohol use: No  . Drug use: No  . Sexual activity: Not on file  Lifestyle  . Physical activity:    Days per week: Not on file    Minutes per session: Not on file  . Stress: Not on file  Relationships  . Social connections:  Talks on phone: Not on file    Gets together: Not on file    Attends religious service: Not on file    Active member of club or organization: Not on file    Attends meetings of clubs or organizations: Not on file    Relationship status: Not on file  . Intimate partner violence:    Fear of current or ex partner: Not on file    Emotionally abused: Not on file    Physically abused: Not on file    Forced sexual activity: Not on file  Other Topics Concern  . Not on file  Social History Narrative   Right handed    2 cups of tea every other day    Lives at home by herself.      Review of Systems: General: negative for chills, fever, night sweats or weight changes.    Cardiovascular: negative for chest pain, dyspnea on exertion, edema, orthopnea, palpitations, paroxysmal nocturnal dyspnea or shortness of breath Dermatological: negative for rash Respiratory: negative for cough or wheezing Urologic: negative for hematuria Abdominal: negative for nausea, vomiting, diarrhea, bright red blood per rectum, melena, or hematemesis Neurologic: negative for visual changes, syncope, or dizziness All other systems reviewed and are otherwise negative except as noted above.    Blood pressure 112/88, pulse 90, height 5' 9"  (1.753 m), weight 280 lb (127 kg).  General appearance: alert and no distress Neck: no adenopathy, no carotid bruit, no JVD, supple, symmetrical, trachea midline and thyroid not enlarged, symmetric, no tenderness/mass/nodules Lungs: clear to auscultation bilaterally Heart: regular rate and rhythm, S1, S2 normal, no murmur, click, rub or gallop Extremities: extremities normal, atraumatic, no cyanosis or edema Pulses: 2+ and symmetric Skin: Skin color, texture, turgor normal. No rashes or lesions Neurologic: Alert and oriented X 3, normal strength and tone. Normal symmetric reflexes. Normal coordination and gait  EKG normal sinus rhythm at 60 with reverse R wave progression. I  Personally reviewed this EKG.  ASSESSMENT AND PLAN:   Hyperlipidemia History of hyperlipidemia not on statin therapy with lipid profile performed 04/02/2017 revealing total cholesterol 259, LDL 159 and HDL of 84.  We will provide her with an AHA low-cholesterol diet.  Thoracic aortic aneurysm without rupture (HCC) History of a small thoracic aortic aneurysm found on CT scanning performed 05/18/2018 measuring 4 x 4 cm.  There is no mention of coronary atherosclerosis.  We will get a CTA on annual basis to follow this.      Lorretta Harp MD FACP,FACC,FAHA, [facility] 06/23/2018 10:33 AM

## 2018-06-27 ENCOUNTER — Other Ambulatory Visit: Payer: Self-pay

## 2018-06-27 DIAGNOSIS — Z01812 Encounter for preprocedural laboratory examination: Secondary | ICD-10-CM

## 2018-06-30 ENCOUNTER — Ambulatory Visit (HOSPITAL_COMMUNITY): Payer: Medicare Other | Attending: Cardiology

## 2018-06-30 DIAGNOSIS — Q231 Congenital insufficiency of aortic valve: Secondary | ICD-10-CM | POA: Insufficient documentation

## 2018-07-14 ENCOUNTER — Other Ambulatory Visit: Payer: Self-pay | Admitting: Hematology and Oncology

## 2018-07-14 LAB — BUN+CREAT
BUN/Creatinine Ratio: 20 (ref 12–28)
BUN: 23 mg/dL (ref 8–27)
Creatinine, Ser: 1.16 mg/dL — ABNORMAL HIGH (ref 0.57–1.00)
GFR calc Af Amer: 57 mL/min/{1.73_m2} — ABNORMAL LOW (ref >59)
GFR calc non Af Amer: 49 mL/min/{1.73_m2} — ABNORMAL LOW (ref >59)

## 2018-07-19 ENCOUNTER — Other Ambulatory Visit: Payer: Self-pay

## 2018-07-19 MED ORDER — POMALIDOMIDE 2 MG PO CAPS: ORAL_CAPSULE | ORAL | 11 refills | Status: DC

## 2018-07-24 ENCOUNTER — Ambulatory Visit (INDEPENDENT_AMBULATORY_CARE_PROVIDER_SITE_OTHER)
Admission: RE | Admit: 2018-07-24 | Discharge: 2018-07-24 | Disposition: A | Discharge status: Home / Self Care | Payer: Medicare Other | Source: Ambulatory Visit | Attending: Cardiovascular Disease | Admitting: Cardiovascular Disease

## 2018-07-24 DIAGNOSIS — I712 Thoracic aortic aneurysm, without rupture, unspecified: Secondary | ICD-10-CM

## 2018-07-24 MED ORDER — IOPAMIDOL (ISOVUE-370) INJECTION 76%
100.0000 mL | Freq: Once | INTRAVENOUS | Status: AC | PRN
  Administered 2018-07-24: 100 mL via INTRAVENOUS

## 2018-08-07 ENCOUNTER — Other Ambulatory Visit: Payer: Self-pay

## 2018-08-07 MED ORDER — POMALIDOMIDE 2 MG PO CAPS: ORAL_CAPSULE | ORAL | 11 refills | Status: DC

## 2018-08-09 ENCOUNTER — Encounter: Payer: Self-pay | Admitting: Hematology and Oncology

## 2018-08-17 ENCOUNTER — Telehealth: Payer: Self-pay | Admitting: Hematology and Oncology

## 2018-08-17 NOTE — Telephone Encounter (Signed)
Scheduled per sch msg. Called and spoke with patient. confirmed date and time

## 2018-08-18 ENCOUNTER — Other Ambulatory Visit: Payer: Medicare Other

## 2018-08-18 ENCOUNTER — Ambulatory Visit: Payer: Medicare Other | Admitting: Hematology and Oncology

## 2018-08-18 ENCOUNTER — Ambulatory Visit: Payer: Medicare Other

## 2018-09-01 ENCOUNTER — Other Ambulatory Visit: Payer: Self-pay

## 2018-09-01 MED ORDER — POMALIDOMIDE 2 MG PO CAPS: ORAL_CAPSULE | ORAL | 11 refills | Status: DC

## 2018-09-13 ENCOUNTER — Encounter: Payer: Self-pay | Admitting: Hematology and Oncology

## 2018-09-14 ENCOUNTER — Other Ambulatory Visit: Payer: Self-pay | Admitting: Hematology and Oncology

## 2018-09-18 ENCOUNTER — Encounter: Payer: Self-pay | Admitting: *Deleted

## 2018-09-18 ENCOUNTER — Telehealth: Payer: Self-pay | Admitting: *Deleted

## 2018-09-18 ENCOUNTER — Inpatient Hospital Stay: Payer: Medicare Other | Attending: Hematology and Oncology | Admitting: Hematology and Oncology

## 2018-09-18 ENCOUNTER — Inpatient Hospital Stay: Payer: Medicare Other

## 2018-09-18 ENCOUNTER — Encounter: Payer: Self-pay | Admitting: Hematology and Oncology

## 2018-09-18 ENCOUNTER — Other Ambulatory Visit: Payer: Self-pay

## 2018-09-18 DIAGNOSIS — T451X5A Adverse effect of antineoplastic and immunosuppressive drugs, initial encounter: Secondary | ICD-10-CM | POA: Diagnosis not present

## 2018-09-18 DIAGNOSIS — Z79899 Other long term (current) drug therapy: Secondary | ICD-10-CM | POA: Diagnosis not present

## 2018-09-18 DIAGNOSIS — G620 Drug-induced polyneuropathy: Secondary | ICD-10-CM | POA: Diagnosis not present

## 2018-09-18 DIAGNOSIS — R7989 Other specified abnormal findings of blood chemistry: Secondary | ICD-10-CM | POA: Diagnosis not present

## 2018-09-18 DIAGNOSIS — C9002 Multiple myeloma in relapse: Secondary | ICD-10-CM

## 2018-09-18 DIAGNOSIS — Z9221 Personal history of antineoplastic chemotherapy: Secondary | ICD-10-CM | POA: Diagnosis not present

## 2018-09-18 DIAGNOSIS — C9000 Multiple myeloma not having achieved remission: Secondary | ICD-10-CM

## 2018-09-18 DIAGNOSIS — Z9484 Stem cells transplant status: Secondary | ICD-10-CM | POA: Insufficient documentation

## 2018-09-18 LAB — CBC WITH DIFFERENTIAL/PLATELET
Abs Immature Granulocytes: 0.01 10*3/uL (ref 0.00–0.07)
Basophils Absolute: 0.1 10*3/uL (ref 0.0–0.1)
Basophils Relative: 2 %
Eosinophils Absolute: 0.2 10*3/uL (ref 0.0–0.5)
Eosinophils Relative: 5 %
HCT: 41.1 % (ref 36.0–46.0)
Hemoglobin: 12.8 g/dL (ref 12.0–15.0)
Immature Granulocytes: 0 %
Lymphocytes Relative: 33 %
Lymphs Abs: 1.3 10*3/uL (ref 0.7–4.0)
MCH: 28.4 pg (ref 26.0–34.0)
MCHC: 31.1 g/dL (ref 30.0–36.0)
MCV: 91.1 fL (ref 80.0–100.0)
Monocytes Absolute: 0.4 10*3/uL (ref 0.1–1.0)
Monocytes Relative: 11 %
Neutro Abs: 1.9 10*3/uL (ref 1.7–7.7)
Neutrophils Relative %: 49 %
Platelets: 258 10*3/uL (ref 150–400)
RBC: 4.51 MIL/uL (ref 3.87–5.11)
RDW: 15.9 % — ABNORMAL HIGH (ref 11.5–15.5)
WBC: 4 10*3/uL (ref 4.0–10.5)
nRBC: 0 % (ref 0.0–0.2)

## 2018-09-18 LAB — COMPREHENSIVE METABOLIC PANEL
ALT: 11 U/L (ref 0–44)
AST: 16 U/L (ref 15–41)
Albumin: 3.5 g/dL (ref 3.5–5.0)
Alkaline Phosphatase: 76 U/L (ref 38–126)
Anion gap: 9 (ref 5–15)
BUN: 16 mg/dL (ref 8–23)
CO2: 29 mmol/L (ref 22–32)
Calcium: 9.3 mg/dL (ref 8.9–10.3)
Chloride: 103 mmol/L (ref 98–111)
Creatinine, Ser: 1.07 mg/dL — ABNORMAL HIGH (ref 0.44–1.00)
GFR calc Af Amer: >60 mL/min (ref >60)
GFR calc non Af Amer: 54 mL/min — ABNORMAL LOW (ref >60)
Glucose, Bld: 82 mg/dL (ref 70–99)
Potassium: 4 mmol/L (ref 3.5–5.1)
Sodium: 141 mmol/L (ref 135–145)
Total Bilirubin: 0.8 mg/dL (ref 0.3–1.2)
Total Protein: 7 g/dL (ref 6.5–8.1)

## 2018-09-18 MED ORDER — SODIUM CHLORIDE 0.9 % IV SOLN
INTRAVENOUS | Status: DC
  Administered 2018-09-18: 10:00:00 via INTRAVENOUS
  Filled 2018-09-18: qty 250

## 2018-09-18 MED ORDER — ZOLEDRONIC ACID 4 MG/100ML IV SOLN
4.0000 mg | Freq: Once | INTRAVENOUS | Status: AC
  Administered 2018-09-18: 10:00:00 4 mg via INTRAVENOUS
  Filled 2018-09-18: qty 100

## 2018-09-18 NOTE — Progress Notes (Signed)
[place] OFFICE PROGRESS NOTE  Patient Care Team: Lucianne Lei, MD as PCP - General (Family Medicine) Jodi Marble, MD as Consulting Physician (Otolaryngology) Heath Lark, MD as Consulting Physician (Hematology and Oncology)  ASSESSMENT & PLAN:  Multiple myeloma with failed remission [facility]) She tolerated pomalidomide well No recent infection Recent myeloma showed complete response.  She will continue treatment as directed She has appointment to return to [facility] for transplant follow-up She will continue aspirin for DVT prophylaxis She will continue calcium with vitamin D and Zometa every 3 months, due today She follows with a dentist on a regular basis with no recent dental issues  Peripheral neuropathy due to chemotherapy [facility]) She felt that her peripheral neuropathy is stable She will continue gabapentin 900 mg twice a day  Elevated serum creatinine She has occasional intermittent elevated serum creatinine We discussed importance of hydration therapy   No orders of the defined types were placed in this encounter.   INTERVAL HISTORY: Please see below for problem oriented charting. She returns for follow-up and Zometa treatment She denies recent dental issues No recent infection, fever or chills Peripheral neuropathy is stable She has appointment pending to return to the transplant center for follow-up She denies recent new bone pain except for occasional shoulder discomfort  SUMMARY OF ONCOLOGIC HISTORY:   Multiple myeloma with failed remission ([place])   03/22/2016 Bone Marrow Biopsy    Bone Marrow Biopsy: Plasma cells 17% by Aspirate and 30% by CD 138 stain. Plasma cell neoplasm, Kappa Restricted Normal cytogenetics, FISH positive for +11 and +14 and +7    04/13/2016 Imaging    Skeletal survey showed lucencies within the calvarium worrisome for myeloma. No definite abnormal lytic or blastic lesions are observed elsewhere.     04/19/2016 - 09/10/2016 Chemotherapy    The patient consented to treatment with Revlimid, dexamethasone and Velcade    09/14/2016 Bone Marrow Biopsy    In summary, there is a normal cellular marrow with trilineage hematopoiesis.A mild plasmacytosis is present, but there is no definitive evidence of residual multiple myeloma    10/12/2016 - 10/12/2016 Chemotherapy    She received melphalan as conditioning chemo    10/13/2016 Bone Marrow Transplant    She received autologous stem cell transplant at [facility]    01/24/2017 Bone Marrow Biopsy    BONE MARROW: -Normocellular marrow for age (50%) with trilineage hematopoiesis. -No morphologic or immunohistochemical evidence of involvement by plasma cell neoplasm (see comment)  PERIPHERAL BLOOD: -Unremarkable (see CBC data)  COMMENT: The bone marrow is normocellular for age and shows adequate trilineage hematopoiesis without significant (<10%) dysplastic changes present. Blasts are not increased. Plasma cells represent about 2% of total cells on aspirate smears. Appropriately controlled immunohistochemical stains are performed on the core biopsy. CD138 highlights small interstitial plasma cells comprising approximately 2% of total cells. These plasma cells appear polytypic as demonstrated by kappa and lambda in-situ hybridization.    01/25/2017 PET scan    No FDG avid osseous lesions or masses are identified.    02/18/2017 - 04/01/2017 Chemotherapy    She received weekly Velcade, Pomalyst and Dexamethasone    04/15/2017 -  Chemotherapy    The patient had Pomalyst only    04/20/2017 Bone Marrow Biopsy    Bone Marrow (BM) and Peripheral Blood (PB) FINAL PATHOLOGIC DIAGNOSIS BONE MARROW: Normocellular bone marrow (40%) with normal numbers of megakaryocytes, erythroid hyperplasia and no increase in plasma cells (1%).     REVIEW  OF SYSTEMS:   Constitutional: Denies fevers, chills or abnormal weight loss Eyes: Denies  blurriness of vision Ears, nose, mouth, throat, and face: Denies mucositis or sore throat Respiratory: Denies cough, dyspnea or wheezes Cardiovascular: Denies palpitation, chest discomfort or lower extremity swelling Gastrointestinal:  Denies nausea, heartburn or change in bowel habits Skin: Denies abnormal skin rashes Lymphatics: Denies new lymphadenopathy or easy bruising Neurological:Denies numbness, tingling or new weaknesses Behavioral/Psych: Mood is stable, no new changes  All other systems were reviewed with the patient and are negative.  I have reviewed the past medical history, past surgical history, social history and family history with the patient and they are unchanged from previous note.  ALLERGIES:  is allergic to revlimid [lenalidomide].  MEDICATIONS:  Current Outpatient Medications  Medication Sig Dispense Refill  . ASPIRIN 81 PO Take 81 mg by mouth daily.    . bisacodyl (DULCOLAX) 5 MG EC tablet Take 5 mg by mouth daily as needed for moderate constipation.    . Calcium Carbonate-Vitamin D (CALCIUM PLUS VITAMIN D PO) Take 1 tablet daily by mouth.    . calcium-vitamin D (OSCAL WITH D) 500-200 MG-UNIT tablet Take 2 tablets by mouth daily.    Marland Kitchen gabapentin (NEURONTIN) 300 MG capsule Take 3 capsules (900 mg total) by mouth 2 (two) times daily. (Patient taking differently: Take 600 mg by mouth 2 (two) times daily. ) 540 capsule 3  . losartan-hydrochlorothiazide (HYZAAR) 100-25 MG tablet Take 1 tablet by mouth daily. (Patient taking differently: Take 0.5 tablets by mouth daily. TAKES 1/2 TABLET DAILY) 90 tablet 3  . Multiple Vitamin (MULTIVITAMIN) tablet Take 1 tablet by mouth daily.    . naproxen sodium (ALEVE) 220 MG tablet Take 440 mg by mouth as needed (leg pain).    . pomalidomide (POMALYST) 2 MG capsule TAKE 1 CAPSULE BY MOUTH ONCE DAILY FOR 21 DAYS ON AND 7 DAYS OFF 21 capsule 11   No current facility-administered medications for this visit.     PHYSICAL  EXAMINATION: ECOG PERFORMANCE STATUS: 1 - Symptomatic but completely ambulatory  Vitals:   09/18/18 0849  BP: 118/73  Pulse: 62  Resp: 18  Temp: 98.9 F (37.2 C)  SpO2: 100%   Filed Weights   09/18/18 0849  Weight: 284 lb 12.8 oz (129.2 kg)    GENERAL:alert, no distress and comfortable SKIN: skin color, texture, turgor are normal, no rashes or significant lesions EYES: normal, Conjunctiva are pink and non-injected, sclera clear OROPHARYNX:no exudate, no erythema and lips, buccal mucosa, and tongue normal  NECK: supple, thyroid normal size, non-tender, without nodularity LYMPH:  no palpable lymphadenopathy in the cervical, axillary or inguinal LUNGS: clear to auscultation and percussion with normal breathing effort HEART: regular rate & rhythm and no murmurs and no lower extremity edema ABDOMEN:abdomen soft, non-tender and normal bowel sounds Musculoskeletal:no cyanosis of digits and no clubbing  NEURO: alert & oriented x 3 with fluent speech, no focal motor/sensory deficits  LABORATORY DATA:  I have reviewed the data as listed    Component Value Date/Time   NA 139 05/19/2018 0856   NA 142 05/09/2017 0840   K 3.3 (L) 05/19/2018 0856   K 4.1 05/09/2017 0840   CL 101 05/19/2018 0856   CO2 25 05/19/2018 0856   CO2 28 05/09/2017 0840   GLUCOSE 79 05/19/2018 0856   GLUCOSE 61 (L) 05/09/2017 0840   BUN 23 07/14/2018 0000   BUN 14.4 05/09/2017 0840   CREATININE 1.16 (H) 07/14/2018 0000   CREATININE 0.9  05/09/2017 0840   CALCIUM 9.2 05/19/2018 0856   CALCIUM 9.3 05/09/2017 0840   PROT 7.5 05/19/2018 0856   PROT 6.1 05/09/2017 0840   PROT 6.3 (L) 05/09/2017 0840   ALBUMIN 3.5 05/19/2018 0856   ALBUMIN 3.4 (L) 05/09/2017 0840   AST 25 05/19/2018 0856   AST 20 05/09/2017 0840   ALT 16 05/19/2018 0856   ALT 21 05/09/2017 0840   ALKPHOS 85 05/19/2018 0856   ALKPHOS 81 05/09/2017 0840   BILITOT 0.4 05/19/2018 0856   BILITOT 0.74 05/09/2017 0840   GFRNONAA 49 (L)  07/14/2018 0000   GFRAA 57 (L) 07/14/2018 0000    No results found for: SPEP, UPEP  Lab Results  Component Value Date   WBC 4.0 09/18/2018   NEUTROABS 1.9 09/18/2018   HGB 12.8 09/18/2018   HCT 41.1 09/18/2018   MCV 91.1 09/18/2018   PLT 258 09/18/2018      Chemistry      Component Value Date/Time   NA 139 05/19/2018 0856   NA 142 05/09/2017 0840   K 3.3 (L) 05/19/2018 0856   K 4.1 05/09/2017 0840   CL 101 05/19/2018 0856   CO2 25 05/19/2018 0856   CO2 28 05/09/2017 0840   BUN 23 07/14/2018 0000   BUN 14.4 05/09/2017 0840   CREATININE 1.16 (H) 07/14/2018 0000   CREATININE 0.9 05/09/2017 0840      Component Value Date/Time   CALCIUM 9.2 05/19/2018 0856   CALCIUM 9.3 05/09/2017 0840   ALKPHOS 85 05/19/2018 0856   ALKPHOS 81 05/09/2017 0840   AST 25 05/19/2018 0856   AST 20 05/09/2017 0840   ALT 16 05/19/2018 0856   ALT 21 05/09/2017 0840   BILITOT 0.4 05/19/2018 0856   BILITOT 0.74 05/09/2017 0840       All questions were answered. The patient knows to call the clinic with any problems, questions or concerns. No barriers to learning was detected.  I spent 15 minutes counseling the patient face to face. The total time spent in the appointment was 20 minutes and more than 50% was on counseling and review of test results  Heath Lark, MD 09/18/2018 9:06 AM

## 2018-09-18 NOTE — Assessment & Plan Note (Signed)
She felt that her peripheral neuropathy is stable She will continue gabapentin 900 mg twice a day

## 2018-09-18 NOTE — Patient Instructions (Signed)

## 2018-09-18 NOTE — Assessment & Plan Note (Signed)
She has occasional intermittent elevated serum creatinine We discussed importance of hydration therapy

## 2018-09-18 NOTE — Assessment & Plan Note (Signed)
She tolerated pomalidomide well No recent infection Recent myeloma showed complete response.  She will continue treatment as directed She has appointment to return to [facility] for transplant follow-up She will continue aspirin for DVT prophylaxis She will continue calcium with vitamin D and Zometa every 3 months, due today She follows with a dentist on a regular basis with no recent dental issues

## 2018-09-18 NOTE — Telephone Encounter (Signed)
Called and left the patient a message with her appts for July 27th. Also left a message that we will mail her out a calendar

## 2018-09-19 LAB — KAPPA/LAMBDA LIGHT CHAINS
Kappa free light chain: 29.3 mg/L — ABNORMAL HIGH (ref 3.3–19.4)
Kappa, lambda light chain ratio: 1.42 (ref 0.26–1.65)
Lambda free light chains: 20.7 mg/L (ref 5.7–26.3)

## 2018-09-19 LAB — MULTIPLE MYELOMA PANEL, SERUM
Albumin SerPl Elph-Mcnc: 3.4 g/dL (ref 2.9–4.4)
Albumin/Glob SerPl: 1.2 (ref 0.7–1.7)
Alpha 1: 0.2 g/dL (ref 0.0–0.4)
Alpha2 Glob SerPl Elph-Mcnc: 0.8 g/dL (ref 0.4–1.0)
B-Globulin SerPl Elph-Mcnc: 1.1 g/dL (ref 0.7–1.3)
Gamma Glob SerPl Elph-Mcnc: 0.9 g/dL (ref 0.4–1.8)
Globulin, Total: 2.9 g/dL (ref 2.2–3.9)
IgA: 216 mg/dL (ref 87–352)
IgG (Immunoglobin G), Serum: 1125 mg/dL (ref 586–1602)
IgM (Immunoglobulin M), Srm: 28 mg/dL (ref 26–217)
Total Protein ELP: 6.3 g/dL (ref 6.0–8.5)

## 2018-09-20 ENCOUNTER — Telehealth: Payer: Self-pay

## 2018-09-20 NOTE — Telephone Encounter (Signed)
-----   Message from Heath Lark, MD sent at 09/20/2018  7:44 AM EDT ----- Regarding: myeloma panel Pls call her and let her know myeloma panel is good and she is still in remission

## 2018-09-20 NOTE — Telephone Encounter (Signed)
Spoke with patient to inform her that myeloma panel is good and she is still in remission.  Patient voiced understanding and thanks for call.

## 2018-10-10 ENCOUNTER — Other Ambulatory Visit: Payer: Self-pay | Admitting: Hematology and Oncology

## 2018-10-10 NOTE — Telephone Encounter (Signed)
-

## 2018-11-06 ENCOUNTER — Other Ambulatory Visit: Payer: Self-pay | Admitting: Hematology and Oncology

## 2018-11-06 NOTE — Telephone Encounter (Signed)
-

## 2018-11-27 ENCOUNTER — Other Ambulatory Visit: Payer: Self-pay | Admitting: *Deleted

## 2018-11-27 MED ORDER — POMALIDOMIDE 2 MG PO CAPS: ORAL_CAPSULE | ORAL | 0 refills | Status: DC

## 2018-11-29 ENCOUNTER — Telehealth: Payer: Self-pay | Admitting: Hematology and Oncology

## 2018-11-29 NOTE — Telephone Encounter (Signed)
Scheduled appt per 7/14 sch message- pt aware of appt date and time

## 2018-12-11 ENCOUNTER — Ambulatory Visit: Payer: Medicare Other | Admitting: Hematology and Oncology

## 2018-12-11 ENCOUNTER — Ambulatory Visit: Payer: Medicare Other

## 2018-12-11 ENCOUNTER — Other Ambulatory Visit: Payer: Medicare Other

## 2019-01-03 ENCOUNTER — Other Ambulatory Visit: Payer: Self-pay | Admitting: Hematology and Oncology

## 2019-01-03 NOTE — Telephone Encounter (Signed)
-

## 2019-01-08 ENCOUNTER — Encounter: Payer: Self-pay | Admitting: Hematology and Oncology

## 2019-01-10 ENCOUNTER — Inpatient Hospital Stay: Payer: Medicare Other | Admitting: Hematology and Oncology

## 2019-01-10 ENCOUNTER — Other Ambulatory Visit: Payer: Self-pay | Admitting: Hematology and Oncology

## 2019-01-10 ENCOUNTER — Inpatient Hospital Stay: Payer: Medicare Other

## 2019-01-10 ENCOUNTER — Other Ambulatory Visit: Payer: Self-pay

## 2019-01-10 ENCOUNTER — Inpatient Hospital Stay: Payer: Medicare Other | Attending: Hematology and Oncology

## 2019-01-10 DIAGNOSIS — K5909 Other constipation: Secondary | ICD-10-CM | POA: Insufficient documentation

## 2019-01-10 DIAGNOSIS — C9000 Multiple myeloma not having achieved remission: Secondary | ICD-10-CM

## 2019-01-10 DIAGNOSIS — Z6841 Body Mass Index (BMI) 40.0 and over, adult: Secondary | ICD-10-CM | POA: Diagnosis not present

## 2019-01-10 DIAGNOSIS — D701 Agranulocytosis secondary to cancer chemotherapy: Secondary | ICD-10-CM | POA: Diagnosis not present

## 2019-01-10 DIAGNOSIS — Z79899 Other long term (current) drug therapy: Secondary | ICD-10-CM | POA: Insufficient documentation

## 2019-01-10 DIAGNOSIS — T451X5A Adverse effect of antineoplastic and immunosuppressive drugs, initial encounter: Secondary | ICD-10-CM

## 2019-01-10 LAB — COMPREHENSIVE METABOLIC PANEL
ALT: 12 U/L (ref 0–44)
AST: 15 U/L (ref 15–41)
Albumin: 3.4 g/dL — ABNORMAL LOW (ref 3.5–5.0)
Alkaline Phosphatase: 69 U/L (ref 38–126)
Anion gap: 10 (ref 5–15)
BUN: 12 mg/dL (ref 8–23)
CO2: 25 mmol/L (ref 22–32)
Calcium: 9.1 mg/dL (ref 8.9–10.3)
Chloride: 105 mmol/L (ref 98–111)
Creatinine, Ser: 0.92 mg/dL (ref 0.44–1.00)
GFR calc Af Amer: >60 mL/min (ref >60)
GFR calc non Af Amer: >60 mL/min (ref >60)
Glucose, Bld: 84 mg/dL (ref 70–99)
Potassium: 3.6 mmol/L (ref 3.5–5.1)
Sodium: 140 mmol/L (ref 135–145)
Total Bilirubin: 0.9 mg/dL (ref 0.3–1.2)
Total Protein: 6.8 g/dL (ref 6.5–8.1)

## 2019-01-10 LAB — CBC WITH DIFFERENTIAL/PLATELET
Abs Immature Granulocytes: 0.01 10*3/uL (ref 0.00–0.07)
Basophils Absolute: 0.1 10*3/uL (ref 0.0–0.1)
Basophils Relative: 4 %
Eosinophils Absolute: 0.4 10*3/uL (ref 0.0–0.5)
Eosinophils Relative: 13 %
HCT: 37.4 % (ref 36.0–46.0)
Hemoglobin: 12 g/dL (ref 12.0–15.0)
Immature Granulocytes: 0 %
Lymphocytes Relative: 32 %
Lymphs Abs: 1 10*3/uL (ref 0.7–4.0)
MCH: 28.7 pg (ref 26.0–34.0)
MCHC: 32.1 g/dL (ref 30.0–36.0)
MCV: 89.5 fL (ref 80.0–100.0)
Monocytes Absolute: 0.5 10*3/uL (ref 0.1–1.0)
Monocytes Relative: 14 %
Neutro Abs: 1.2 10*3/uL — ABNORMAL LOW (ref 1.7–7.7)
Neutrophils Relative %: 37 %
Platelets: 181 10*3/uL (ref 150–400)
RBC: 4.18 MIL/uL (ref 3.87–5.11)
RDW: 15.8 % — ABNORMAL HIGH (ref 11.5–15.5)
WBC: 3.2 10*3/uL — ABNORMAL LOW (ref 4.0–10.5)
nRBC: 0 % (ref 0.0–0.2)

## 2019-01-10 MED ORDER — ZOLEDRONIC ACID 4 MG/100ML IV SOLN
4.0000 mg | Freq: Once | INTRAVENOUS | Status: AC
  Administered 2019-01-10: 10:00:00 4 mg via INTRAVENOUS
  Filled 2019-01-10: qty 100

## 2019-01-10 MED ORDER — SODIUM CHLORIDE 0.9 % IV SOLN
Freq: Once | INTRAVENOUS | Status: AC
  Administered 2019-01-10: 10:00:00 via INTRAVENOUS
  Filled 2019-01-10: qty 250

## 2019-01-10 NOTE — Patient Instructions (Signed)

## 2019-01-11 ENCOUNTER — Encounter: Payer: Self-pay | Admitting: Hematology and Oncology

## 2019-01-11 DIAGNOSIS — K5909 Other constipation: Secondary | ICD-10-CM | POA: Insufficient documentation

## 2019-01-11 DIAGNOSIS — D701 Agranulocytosis secondary to cancer chemotherapy: Secondary | ICD-10-CM | POA: Insufficient documentation

## 2019-01-11 DIAGNOSIS — T451X5A Adverse effect of antineoplastic and immunosuppressive drugs, initial encounter: Secondary | ICD-10-CM | POA: Insufficient documentation

## 2019-01-11 LAB — MULTIPLE MYELOMA PANEL, SERUM
Albumin SerPl Elph-Mcnc: 3.3 g/dL (ref 2.9–4.4)
Albumin/Glob SerPl: 1.2 (ref 0.7–1.7)
Alpha 1: 0.2 g/dL (ref 0.0–0.4)
Alpha2 Glob SerPl Elph-Mcnc: 0.8 g/dL (ref 0.4–1.0)
B-Globulin SerPl Elph-Mcnc: 0.9 g/dL (ref 0.7–1.3)
Gamma Glob SerPl Elph-Mcnc: 1 g/dL (ref 0.4–1.8)
Globulin, Total: 2.8 g/dL (ref 2.2–3.9)
IgA: 195 mg/dL (ref 87–352)
IgG (Immunoglobin G), Serum: 1125 mg/dL (ref 586–1602)
IgM (Immunoglobulin M), Srm: 73 mg/dL (ref 26–217)
Total Protein ELP: 6.1 g/dL (ref 6.0–8.5)

## 2019-01-11 LAB — KAPPA/LAMBDA LIGHT CHAINS
Kappa free light chain: 50.9 mg/L — ABNORMAL HIGH (ref 3.3–19.4)
Kappa, lambda light chain ratio: 1.51 (ref 0.26–1.65)
Lambda free light chains: 33.7 mg/L — ABNORMAL HIGH (ref 5.7–26.3)

## 2019-01-11 NOTE — Progress Notes (Signed)
[place] OFFICE PROGRESS NOTE  Patient Care Team: Lucianne Lei, MD as PCP - General (Family Medicine) Jodi Marble, MD as Consulting Physician (Otolaryngology) Heath Lark, MD as Consulting Physician (Hematology and Oncology)  ASSESSMENT & PLAN:  Multiple myeloma with failed remission [facility]) She tolerated pomalidomide well No recent infection Recent myeloma showed complete response.  She will continue treatment as directed She has appointment to return to [facility] for transplant follow-up She will continue aspirin for DVT prophylaxis She will continue calcium with vitamin D and Zometa every 3 months, due today She follows with a dentist on a regular basis with no recent dental issues  Leukopenia due to antineoplastic chemotherapy [facility]) This is likely due to recent treatment. The patient denies recent history of fevers, cough, chills, diarrhea or dysuria. She is asymptomatic from the leukopenia. I will observe for now.  I will continue the chemotherapy at current dose without dosage adjustment.  If the leukopenia gets progressive worse in the future, I might have to delay her treatment or adjust the chemotherapy dose.    Morbid obesity with BMI of 40.0-44.9, adult ([place]) She managed to lose some weight with dietary modification I congratulated her efforts in weight loss  Other constipation This is likely related to recent dietary changes I recommend laxative therapy.   No orders of the defined types were placed in this encounter.   INTERVAL HISTORY: Please see below for problem oriented charting. She returns for further follow-up She feels well No recent infection, fever or chills She had mild constipation recently due to dietary changes She had successfully lost approximately 14 pounds of weight No new bone pain.  SUMMARY OF ONCOLOGIC HISTORY: Oncology History  Multiple myeloma with failed remission ([place])  03/22/2016 Bone Marrow  Biopsy   Bone Marrow Biopsy: Plasma cells 17% by Aspirate and 30% by CD 138 stain. Plasma cell neoplasm, Kappa Restricted Normal cytogenetics, FISH positive for +11 and +14 and +7   04/13/2016 Imaging   Skeletal survey showed lucencies within the calvarium worrisome for myeloma. No definite abnormal lytic or blastic lesions are observed elsewhere.   04/19/2016 - 09/10/2016 Chemotherapy   The patient consented to treatment with Revlimid, dexamethasone and Velcade   09/14/2016 Bone Marrow Biopsy   In summary, there is a normal cellular marrow with trilineage hematopoiesis.A mild plasmacytosis is present, but there is no definitive evidence of residual multiple myeloma   10/12/2016 - 10/12/2016 Chemotherapy   She received melphalan as conditioning chemo   10/13/2016 Bone Marrow Transplant   She received autologous stem cell transplant at [facility]   01/24/2017 Bone Marrow Biopsy   BONE MARROW: -Normocellular marrow for age (50%) with trilineage hematopoiesis. -No morphologic or immunohistochemical evidence of involvement by plasma cell neoplasm (see comment)  PERIPHERAL BLOOD: -Unremarkable (see CBC data)  COMMENT: The bone marrow is normocellular for age and shows adequate trilineage hematopoiesis without significant (<10%) dysplastic changes present. Blasts are not increased. Plasma cells represent about 2% of total cells on aspirate smears. Appropriately controlled immunohistochemical stains are performed on the core biopsy. CD138 highlights small interstitial plasma cells comprising approximately 2% of total cells. These plasma cells appear polytypic as demonstrated by kappa and lambda in-situ hybridization.   01/25/2017 PET scan   No FDG avid osseous lesions or masses are identified.   02/18/2017 - 04/01/2017 Chemotherapy   She received weekly Velcade, Pomalyst and Dexamethasone   04/15/2017 -  Chemotherapy   The patient had Pomalyst only  04/20/2017 Bone  Marrow Biopsy   Bone Marrow (BM) and Peripheral Blood (PB) FINAL PATHOLOGIC DIAGNOSIS BONE MARROW: Normocellular bone marrow (40%) with normal numbers of megakaryocytes, erythroid hyperplasia and no increase in plasma cells (1%).     REVIEW OF SYSTEMS:   Constitutional: Denies fevers, chills or abnormal weight loss Eyes: Denies blurriness of vision Ears, nose, mouth, throat, and face: Denies mucositis or sore throat Respiratory: Denies cough, dyspnea or wheezes Cardiovascular: Denies palpitation, chest discomfort or lower extremity swelling Gastrointestinal:  Denies nausea, heartburn or change in bowel habits Skin: Denies abnormal skin rashes Lymphatics: Denies new lymphadenopathy or easy bruising Neurological:Denies numbness, tingling or new weaknesses Behavioral/Psych: Mood is stable, no new changes  All other systems were reviewed with the patient and are negative.  I have reviewed the past medical history, past surgical history, social history and family history with the patient and they are unchanged from previous note.  ALLERGIES:  is allergic to revlimid [lenalidomide].  MEDICATIONS:  Current Outpatient Medications  Medication Sig Dispense Refill  . ASPIRIN 81 PO Take 81 mg by mouth daily.    . bisacodyl (DULCOLAX) 5 MG EC tablet Take 5 mg by mouth daily as needed for moderate constipation.    . Calcium Carbonate-Vitamin D (CALCIUM PLUS VITAMIN D PO) Take 1 tablet daily by mouth.    . calcium-vitamin D (OSCAL WITH D) 500-200 MG-UNIT tablet Take 2 tablets by mouth daily.    Marland Kitchen gabapentin (NEURONTIN) 300 MG capsule Take 3 capsules (900 mg total) by mouth 2 (two) times daily. (Patient taking differently: Take 600 mg by mouth 2 (two) times daily. ) 540 capsule 3  . losartan-hydrochlorothiazide (HYZAAR) 100-25 MG tablet Take 1 tablet by mouth daily. (Patient taking differently: Take 0.5 tablets by mouth daily. TAKES 1/2 TABLET DAILY) 90 tablet 3  . Multiple Vitamin (MULTIVITAMIN)  tablet Take 1 tablet by mouth daily.    . naproxen sodium (ALEVE) 220 MG tablet Take 440 mg by mouth as needed (leg pain).    Marland Kitchen POMALYST 2 MG capsule TAKE 1 CAPSULE BY MOUTH ONCE DAILY FOR 21 DAYS ON AND 7 DAYS OFF 21 capsule 0   No current facility-administered medications for this visit.     PHYSICAL EXAMINATION: ECOG PERFORMANCE STATUS: 1 - Symptomatic but completely ambulatory  Vitals:   01/10/19 0910  BP: 116/71  Pulse: (!) 58  Resp: 18  Temp: 98 F (36.7 C)  SpO2: 100%   Filed Weights   01/10/19 0910  Weight: 271 lb 6.4 oz (123.1 kg)    GENERAL:alert, no distress and comfortable SKIN: skin color, texture, turgor are normal, no rashes or significant lesions EYES: normal, Conjunctiva are pink and non-injected, sclera clear OROPHARYNX:no exudate, no erythema and lips, buccal mucosa, and tongue normal  NECK: supple, thyroid normal size, non-tender, without nodularity LYMPH:  no palpable lymphadenopathy in the cervical, axillary or inguinal LUNGS: clear to auscultation and percussion with normal breathing effort HEART: regular rate & rhythm and no murmurs and no lower extremity edema ABDOMEN:abdomen soft, non-tender and normal bowel sounds Musculoskeletal:no cyanosis of digits and no clubbing  NEURO: alert & oriented x 3 with fluent speech, no focal motor/sensory deficits  LABORATORY DATA:  I have reviewed the data as listed    Component Value Date/Time   NA 140 01/10/2019 0830   NA 142 05/09/2017 0840   K 3.6 01/10/2019 0830   K 4.1 05/09/2017 0840   CL 105 01/10/2019 0830   CO2 25 01/10/2019 0830  CO2 28 05/09/2017 0840   GLUCOSE 84 01/10/2019 0830   GLUCOSE 61 (L) 05/09/2017 0840   BUN 12 01/10/2019 0830   BUN 23 07/14/2018 0000   BUN 14.4 05/09/2017 0840   CREATININE 0.92 01/10/2019 0830   CREATININE 0.9 05/09/2017 0840   CALCIUM 9.1 01/10/2019 0830   CALCIUM 9.3 05/09/2017 0840   PROT 6.8 01/10/2019 0830   PROT 6.1 05/09/2017 0840   PROT 6.3 (L)  05/09/2017 0840   ALBUMIN 3.4 (L) 01/10/2019 0830   ALBUMIN 3.4 (L) 05/09/2017 0840   AST 15 01/10/2019 0830   AST 20 05/09/2017 0840   ALT 12 01/10/2019 0830   ALT 21 05/09/2017 0840   ALKPHOS 69 01/10/2019 0830   ALKPHOS 81 05/09/2017 0840   BILITOT 0.9 01/10/2019 0830   BILITOT 0.74 05/09/2017 0840   GFRNONAA >60 01/10/2019 0830   GFRAA >60 01/10/2019 0830    No results found for: SPEP, UPEP  Lab Results  Component Value Date   WBC 3.2 (L) 01/10/2019   NEUTROABS 1.2 (L) 01/10/2019   HGB 12.0 01/10/2019   HCT 37.4 01/10/2019   MCV 89.5 01/10/2019   PLT 181 01/10/2019      Chemistry      Component Value Date/Time   NA 140 01/10/2019 0830   NA 142 05/09/2017 0840   K 3.6 01/10/2019 0830   K 4.1 05/09/2017 0840   CL 105 01/10/2019 0830   CO2 25 01/10/2019 0830   CO2 28 05/09/2017 0840   BUN 12 01/10/2019 0830   BUN 23 07/14/2018 0000   BUN 14.4 05/09/2017 0840   CREATININE 0.92 01/10/2019 0830   CREATININE 0.9 05/09/2017 0840      Component Value Date/Time   CALCIUM 9.1 01/10/2019 0830   CALCIUM 9.3 05/09/2017 0840   ALKPHOS 69 01/10/2019 0830   ALKPHOS 81 05/09/2017 0840   AST 15 01/10/2019 0830   AST 20 05/09/2017 0840   ALT 12 01/10/2019 0830   ALT 21 05/09/2017 0840   BILITOT 0.9 01/10/2019 0830   BILITOT 0.74 05/09/2017 0840       All questions were answered. The patient knows to call the clinic with any problems, questions or concerns. No barriers to learning was detected.  I spent 15 minutes counseling the patient face to face. The total time spent in the appointment was 20 minutes and more than 50% was on counseling and review of test results  Heath Lark, MD 01/11/2019 9:53 AM

## 2019-01-11 NOTE — Assessment & Plan Note (Signed)
This is likely due to recent treatment. The patient denies recent history of fevers, cough, chills, diarrhea or dysuria. She is asymptomatic from the leukopenia. I will observe for now.  I will continue the chemotherapy at current dose without dosage adjustment.  If the leukopenia gets progressive worse in the future, I might have to delay her treatment or adjust the chemotherapy dose.   

## 2019-01-11 NOTE — Assessment & Plan Note (Signed)
She tolerated pomalidomide well No recent infection Recent myeloma showed complete response.  She will continue treatment as directed She has appointment to return to [facility] for transplant follow-up She will continue aspirin for DVT prophylaxis She will continue calcium with vitamin D and Zometa every 3 months, due today She follows with a dentist on a regular basis with no recent dental issues 

## 2019-01-11 NOTE — Assessment & Plan Note (Signed)
She managed to lose some weight with dietary modification I congratulated her efforts in weight loss

## 2019-01-11 NOTE — Assessment & Plan Note (Signed)
This is likely related to recent dietary changes I recommend laxative therapy.

## 2019-01-12 ENCOUNTER — Telehealth: Payer: Self-pay | Admitting: Hematology and Oncology

## 2019-01-12 NOTE — Telephone Encounter (Signed)
I talk with patient regarding schedule  

## 2019-01-15 ENCOUNTER — Telehealth: Payer: Self-pay

## 2019-01-15 NOTE — Telephone Encounter (Signed)
-----   Message from Heath Lark, MD sent at 01/15/2019  8:15 AM EDT ----- Regarding: myeloma panel in remission pls let her know

## 2019-01-15 NOTE — Telephone Encounter (Signed)
Spoke with pt by phone and gave below msg.  

## 2019-01-23 ENCOUNTER — Encounter: Payer: Self-pay | Admitting: Hematology and Oncology

## 2019-01-29 ENCOUNTER — Other Ambulatory Visit: Payer: Self-pay | Admitting: Hematology and Oncology

## 2019-01-29 NOTE — Telephone Encounter (Signed)
-

## 2019-02-26 ENCOUNTER — Other Ambulatory Visit: Payer: Self-pay | Admitting: Hematology and Oncology

## 2019-02-26 NOTE — Telephone Encounter (Signed)
-

## 2019-03-22 ENCOUNTER — Other Ambulatory Visit: Payer: Self-pay

## 2019-03-22 MED ORDER — POMALIDOMIDE 2 MG PO CAPS: ORAL_CAPSULE | ORAL | 0 refills | Status: DC

## 2019-04-16 ENCOUNTER — Other Ambulatory Visit: Payer: Self-pay

## 2019-04-16 ENCOUNTER — Inpatient Hospital Stay: Payer: Medicare Other | Attending: Hematology and Oncology

## 2019-04-16 ENCOUNTER — Inpatient Hospital Stay: Payer: Medicare Other | Admitting: Hematology and Oncology

## 2019-04-16 ENCOUNTER — Inpatient Hospital Stay: Payer: Medicare Other

## 2019-04-16 DIAGNOSIS — R5381 Other malaise: Secondary | ICD-10-CM | POA: Diagnosis not present

## 2019-04-16 DIAGNOSIS — C9001 Multiple myeloma in remission: Secondary | ICD-10-CM

## 2019-04-16 DIAGNOSIS — C9000 Multiple myeloma not having achieved remission: Secondary | ICD-10-CM

## 2019-04-16 DIAGNOSIS — T451X5A Adverse effect of antineoplastic and immunosuppressive drugs, initial encounter: Secondary | ICD-10-CM | POA: Diagnosis not present

## 2019-04-16 DIAGNOSIS — G620 Drug-induced polyneuropathy: Secondary | ICD-10-CM

## 2019-04-16 LAB — COMPREHENSIVE METABOLIC PANEL
ALT: 17 U/L (ref 0–44)
AST: 21 U/L (ref 15–41)
Albumin: 3.4 g/dL — ABNORMAL LOW (ref 3.5–5.0)
Alkaline Phosphatase: 81 U/L (ref 38–126)
Anion gap: 11 (ref 5–15)
BUN: 18 mg/dL (ref 8–23)
CO2: 25 mmol/L (ref 22–32)
Calcium: 9.2 mg/dL (ref 8.9–10.3)
Chloride: 103 mmol/L (ref 98–111)
Creatinine, Ser: 0.98 mg/dL (ref 0.44–1.00)
GFR calc Af Amer: >60 mL/min (ref >60)
GFR calc non Af Amer: 60 mL/min — ABNORMAL LOW (ref >60)
Glucose, Bld: 75 mg/dL (ref 70–99)
Potassium: 3.7 mmol/L (ref 3.5–5.1)
Sodium: 139 mmol/L (ref 135–145)
Total Bilirubin: 0.5 mg/dL (ref 0.3–1.2)
Total Protein: 7 g/dL (ref 6.5–8.1)

## 2019-04-16 LAB — CBC WITH DIFFERENTIAL/PLATELET
Abs Immature Granulocytes: 0.03 10*3/uL (ref 0.00–0.07)
Basophils Absolute: 0.1 10*3/uL (ref 0.0–0.1)
Basophils Relative: 2 %
Eosinophils Absolute: 0.1 10*3/uL (ref 0.0–0.5)
Eosinophils Relative: 2 %
HCT: 37.8 % (ref 36.0–46.0)
Hemoglobin: 12.1 g/dL (ref 12.0–15.0)
Immature Granulocytes: 1 %
Lymphocytes Relative: 17 %
Lymphs Abs: 0.8 10*3/uL (ref 0.7–4.0)
MCH: 28.5 pg (ref 26.0–34.0)
MCHC: 32 g/dL (ref 30.0–36.0)
MCV: 89.2 fL (ref 80.0–100.0)
Monocytes Absolute: 0.7 10*3/uL (ref 0.1–1.0)
Monocytes Relative: 14 %
Neutro Abs: 3 10*3/uL (ref 1.7–7.7)
Neutrophils Relative %: 64 %
Platelets: 249 10*3/uL (ref 150–400)
RBC: 4.24 MIL/uL (ref 3.87–5.11)
RDW: 15.7 % — ABNORMAL HIGH (ref 11.5–15.5)
WBC: 4.6 10*3/uL (ref 4.0–10.5)
nRBC: 0 % (ref 0.0–0.2)

## 2019-04-16 MED ORDER — GABAPENTIN 300 MG PO CAPS: 600.0000 mg | ORAL_CAPSULE | Freq: Two times a day (BID) | ORAL | 11 refills | Status: DC

## 2019-04-16 MED ORDER — ZOLEDRONIC ACID 4 MG/100ML IV SOLN
4.0000 mg | Freq: Once | INTRAVENOUS | Status: AC
  Administered 2019-04-16: 4 mg via INTRAVENOUS

## 2019-04-16 MED ORDER — ZOLEDRONIC ACID 4 MG/100ML IV SOLN
INTRAVENOUS | Status: AC
  Filled 2019-04-16: qty 100

## 2019-04-16 MED ORDER — SODIUM CHLORIDE 0.9 % IV SOLN
Freq: Once | INTRAVENOUS | Status: AC
  Administered 2019-04-16: 10:00:00 via INTRAVENOUS
  Filled 2019-04-16: qty 250

## 2019-04-16 NOTE — Patient Instructions (Signed)

## 2019-04-17 ENCOUNTER — Encounter: Payer: Self-pay | Admitting: Hematology and Oncology

## 2019-04-17 LAB — MULTIPLE MYELOMA PANEL, SERUM
Albumin SerPl Elph-Mcnc: 3.4 g/dL (ref 2.9–4.4)
Albumin/Glob SerPl: 1.1 (ref 0.7–1.7)
Alpha 1: 0.2 g/dL (ref 0.0–0.4)
Alpha2 Glob SerPl Elph-Mcnc: 0.9 g/dL (ref 0.4–1.0)
B-Globulin SerPl Elph-Mcnc: 1 g/dL (ref 0.7–1.3)
Gamma Glob SerPl Elph-Mcnc: 1 g/dL (ref 0.4–1.8)
Globulin, Total: 3.1 g/dL (ref 2.2–3.9)
IgA: 195 mg/dL (ref 87–352)
IgG (Immunoglobin G), Serum: 1152 mg/dL (ref 586–1602)
IgM (Immunoglobulin M), Srm: 35 mg/dL (ref 26–217)
Total Protein ELP: 6.5 g/dL (ref 6.0–8.5)

## 2019-04-17 LAB — KAPPA/LAMBDA LIGHT CHAINS
Kappa free light chain: 35 mg/L — ABNORMAL HIGH (ref 3.3–19.4)
Kappa, lambda light chain ratio: 1.5 (ref 0.26–1.65)
Lambda free light chains: 23.4 mg/L (ref 5.7–26.3)

## 2019-04-17 NOTE — Assessment & Plan Note (Signed)
She has multifactorial joint pain and deconditioning I recommend referral to physical therapy and rehab and she agreed

## 2019-04-17 NOTE — Assessment & Plan Note (Signed)
She tolerated pomalidomide well No recent infection Recent myeloma showed complete response.  She will continue treatment as directed She has appointment to return to [facility] for transplant follow-up She will continue aspirin for DVT prophylaxis She will continue calcium with vitamin D and Zometa every 3 months, due today She follows with a dentist on a regular basis with no recent dental issues

## 2019-04-17 NOTE — Progress Notes (Signed)
[place] [place] OFFICE PROGRESS NOTE  Patient Care Team: Lucianne Lei, MD as PCP - General (Family Medicine) Jodi Marble, MD as Consulting Physician (Otolaryngology) Heath Lark, MD as Consulting Physician (Hematology and Oncology)  ASSESSMENT & PLAN:  Multiple myeloma in remission [facility]) She tolerated pomalidomide well No recent infection Recent myeloma showed complete response.  She will continue treatment as directed She has appointment to return to [facility] for transplant follow-up She will continue aspirin for DVT prophylaxis She will continue calcium with vitamin D and Zometa every 3 months, due today She follows with a dentist on a regular basis with no recent dental issues  Peripheral neuropathy due to chemotherapy [facility]) She felt that her peripheral neuropathy is stable She will continue gabapentin 600 mg twice a day  Physical debility She has multifactorial joint pain and deconditioning I recommend referral to physical therapy and rehab and she agreed   Orders Placed This Encounter  Procedures  . Ambulatory referral to Physical Therapy    Referral Priority:   Routine    Referral Type:   Physical Medicine    Referral Reason:   Specialty Services Required    Requested Specialty:   Physical Therapy    Number of Visits Requested:   1    INTERVAL HISTORY: Please see below for problem oriented charting. She returns for further follow-up She complained of diffuse musculoskeletal pain and deconditioning with fatigue No recent infection, fever or chills She had progressive weight gain secondary to poor dietary choices and reduced activity No recent dental issues No recent infection, fever or chills Her peripheral neuropathy is stable  SUMMARY OF ONCOLOGIC HISTORY: Oncology History  Multiple myeloma in remission ([place])  03/22/2016 Bone Marrow Biopsy   Bone Marrow Biopsy: Plasma cells 17% by Aspirate and 30% by CD 138 stain. Plasma  cell neoplasm, Kappa Restricted Normal cytogenetics, FISH positive for +11 and +14 and +7   04/13/2016 Imaging   Skeletal survey showed lucencies within the calvarium worrisome for myeloma. No definite abnormal lytic or blastic lesions are observed elsewhere.   04/19/2016 - 09/10/2016 Chemotherapy   The patient consented to treatment with Revlimid, dexamethasone and Velcade   09/14/2016 Bone Marrow Biopsy   In summary, there is a normal cellular marrow with trilineage hematopoiesis.A mild plasmacytosis is present, but there is no definitive evidence of residual multiple myeloma   10/12/2016 - 10/12/2016 Chemotherapy   She received melphalan as conditioning chemo   10/13/2016 Bone Marrow Transplant   She received autologous stem cell transplant at [facility]   01/24/2017 Bone Marrow Biopsy   BONE MARROW: -Normocellular marrow for age (50%) with trilineage hematopoiesis. -No morphologic or immunohistochemical evidence of involvement by plasma cell neoplasm (see comment)  PERIPHERAL BLOOD: -Unremarkable (see CBC data)  COMMENT: The bone marrow is normocellular for age and shows adequate trilineage hematopoiesis without significant (<10%) dysplastic changes present. Blasts are not increased. Plasma cells represent about 2% of total cells on aspirate smears. Appropriately controlled immunohistochemical stains are performed on the core biopsy. CD138 highlights small interstitial plasma cells comprising approximately 2% of total cells. These plasma cells appear polytypic as demonstrated by kappa and lambda in-situ hybridization.   01/25/2017 PET scan   No FDG avid osseous lesions or masses are identified.   02/18/2017 - 04/01/2017 Chemotherapy   She received weekly Velcade, Pomalyst and Dexamethasone   04/15/2017 -  Chemotherapy   The patient had Pomalyst only   04/20/2017 Bone Marrow Biopsy   Bone  Marrow (BM) and Peripheral Blood (PB) FINAL PATHOLOGIC  DIAGNOSIS BONE MARROW: Normocellular bone marrow (40%) with normal numbers of megakaryocytes, erythroid hyperplasia and no increase in plasma cells (1%).     REVIEW OF SYSTEMS:   Constitutional: Denies fevers, chills or abnormal weight loss Eyes: Denies blurriness of vision Ears, nose, mouth, throat, and face: Denies mucositis or sore throat Respiratory: Denies cough, dyspnea or wheezes Cardiovascular: Denies palpitation, chest discomfort or lower extremity swelling Gastrointestinal:  Denies nausea, heartburn or change in bowel habits Skin: Denies abnormal skin rashes Lymphatics: Denies new lymphadenopathy or easy bruising Behavioral/Psych: Mood is stable, no new changes  All other systems were reviewed with the patient and are negative.  I have reviewed the past medical history, past surgical history, social history and family history with the patient and they are unchanged from previous note.  ALLERGIES:  is allergic to revlimid [lenalidomide].  MEDICATIONS:  Current Outpatient Medications  Medication Sig Dispense Refill  . ASPIRIN 81 PO Take 81 mg by mouth daily.    . bisacodyl (DULCOLAX) 5 MG EC tablet Take 5 mg by mouth daily as needed for moderate constipation.    . Calcium Carbonate-Vitamin D (CALCIUM PLUS VITAMIN D PO) Take 1 tablet daily by mouth.    . calcium-vitamin D (OSCAL WITH D) 500-200 MG-UNIT tablet Take 2 tablets by mouth daily.    Marland Kitchen gabapentin (NEURONTIN) 300 MG capsule Take 2 capsules (600 mg total) by mouth 2 (two) times daily. 360 capsule 11  . losartan-hydrochlorothiazide (HYZAAR) 100-25 MG tablet Take 1 tablet by mouth daily. (Patient taking differently: Take 0.5 tablets by mouth daily. TAKES 1/2 TABLET DAILY) 90 tablet 3  . Multiple Vitamin (MULTIVITAMIN) tablet Take 1 tablet by mouth daily.    . naproxen sodium (ALEVE) 220 MG tablet Take 440 mg by mouth as needed (leg pain).    . pomalidomide (POMALYST) 2 MG capsule TAKE 1 CAPSULE BY MOUTH ONCE DAILY FOR 21  DAYS ON AND 7 DAYS OFF 21 capsule 0   No current facility-administered medications for this visit.     PHYSICAL EXAMINATION: ECOG PERFORMANCE STATUS: 1 - Symptomatic but completely ambulatory  Vitals:   04/16/19 0833  BP: 122/76  Pulse: 77  Resp: 18  Temp: 98 F (36.7 C)  SpO2: 100%   Filed Weights   04/16/19 0833  Weight: 281 lb 8 oz (127.7 kg)    GENERAL:alert, no distress and comfortable SKIN: skin color, texture, turgor are normal, no rashes or significant lesions EYES: normal, Conjunctiva are pink and non-injected, sclera clear OROPHARYNX:no exudate, no erythema and lips, buccal mucosa, and tongue normal  NECK: supple, thyroid normal size, non-tender, without nodularity LYMPH:  no palpable lymphadenopathy in the cervical, axillary or inguinal LUNGS: clear to auscultation and percussion with normal breathing effort HEART: regular rate & rhythm and no murmurs and no lower extremity edema ABDOMEN:abdomen soft, non-tender and normal bowel sounds Musculoskeletal:no cyanosis of digits and no clubbing  NEURO: alert & oriented x 3 with fluent speech, no focal motor/sensory deficits  LABORATORY DATA:  I have reviewed the data as listed    Component Value Date/Time   NA 139 04/16/2019 0814   NA 142 05/09/2017 0840   K 3.7 04/16/2019 0814   K 4.1 05/09/2017 0840   CL 103 04/16/2019 0814   CO2 25 04/16/2019 0814   CO2 28 05/09/2017 0840   GLUCOSE 75 04/16/2019 0814   GLUCOSE 61 (L) 05/09/2017 0840   BUN 18 04/16/2019 0814   BUN  23 07/14/2018 0000   BUN 14.4 05/09/2017 0840   CREATININE 0.98 04/16/2019 0814   CREATININE 0.9 05/09/2017 0840   CALCIUM 9.2 04/16/2019 0814   CALCIUM 9.3 05/09/2017 0840   PROT 7.0 04/16/2019 0814   PROT 6.1 05/09/2017 0840   PROT 6.3 (L) 05/09/2017 0840   ALBUMIN 3.4 (L) 04/16/2019 0814   ALBUMIN 3.4 (L) 05/09/2017 0840   AST 21 04/16/2019 0814   AST 20 05/09/2017 0840   ALT 17 04/16/2019 0814   ALT 21 05/09/2017 0840   ALKPHOS 81  04/16/2019 0814   ALKPHOS 81 05/09/2017 0840   BILITOT 0.5 04/16/2019 0814   BILITOT 0.74 05/09/2017 0840   GFRNONAA 60 (L) 04/16/2019 0814   GFRAA >60 04/16/2019 0814    No results found for: SPEP, UPEP  Lab Results  Component Value Date   WBC 4.6 04/16/2019   NEUTROABS 3.0 04/16/2019   HGB 12.1 04/16/2019   HCT 37.8 04/16/2019   MCV 89.2 04/16/2019   PLT 249 04/16/2019      Chemistry      Component Value Date/Time   NA 139 04/16/2019 0814   NA 142 05/09/2017 0840   K 3.7 04/16/2019 0814   K 4.1 05/09/2017 0840   CL 103 04/16/2019 0814   CO2 25 04/16/2019 0814   CO2 28 05/09/2017 0840   BUN 18 04/16/2019 0814   BUN 23 07/14/2018 0000   BUN 14.4 05/09/2017 0840   CREATININE 0.98 04/16/2019 0814   CREATININE 0.9 05/09/2017 0840      Component Value Date/Time   CALCIUM 9.2 04/16/2019 0814   CALCIUM 9.3 05/09/2017 0840   ALKPHOS 81 04/16/2019 0814   ALKPHOS 81 05/09/2017 0840   AST 21 04/16/2019 0814   AST 20 05/09/2017 0840   ALT 17 04/16/2019 0814   ALT 21 05/09/2017 0840   BILITOT 0.5 04/16/2019 0814   BILITOT 0.74 05/09/2017 0840     All questions were answered. The patient knows to call the clinic with any problems, questions or concerns. No barriers to learning was detected.  I spent 15 minutes counseling the patient face to face. The total time spent in the appointment was 20 minutes and more than 50% was on counseling and review of test results  Heath Lark, MD 04/17/2019 8:36 AM

## 2019-04-17 NOTE — Assessment & Plan Note (Addendum)
She felt that her peripheral neuropathy is stable She will continue gabapentin 600 mg twice a day

## 2019-04-18 ENCOUNTER — Telehealth: Payer: Self-pay | Admitting: *Deleted

## 2019-04-18 NOTE — Telephone Encounter (Signed)
-----   Message from Heath Lark, MD sent at 04/18/2019  9:04 AM EST ----- Regarding: pls call and let her know she is still in remission

## 2019-04-18 NOTE — Telephone Encounter (Signed)
Telephone call to patient to advise lab results as directed below. LM for return call.

## 2019-04-19 ENCOUNTER — Telehealth: Payer: Self-pay | Admitting: Hematology and Oncology

## 2019-04-19 NOTE — Telephone Encounter (Signed)
Left message re March appointments. Schedule mailed.

## 2019-04-20 ENCOUNTER — Ambulatory Visit: Payer: Medicare Other | Admitting: Rehabilitation

## 2019-04-24 ENCOUNTER — Encounter: Payer: Self-pay | Admitting: Physical Therapy

## 2019-04-24 ENCOUNTER — Other Ambulatory Visit: Payer: Self-pay

## 2019-04-24 ENCOUNTER — Other Ambulatory Visit: Payer: Self-pay | Admitting: Hematology and Oncology

## 2019-04-24 ENCOUNTER — Ambulatory Visit: Payer: Medicare Other | Attending: Hematology and Oncology | Admitting: Physical Therapy

## 2019-04-24 DIAGNOSIS — M6281 Muscle weakness (generalized): Secondary | ICD-10-CM | POA: Insufficient documentation

## 2019-04-24 DIAGNOSIS — R262 Difficulty in walking, not elsewhere classified: Secondary | ICD-10-CM | POA: Insufficient documentation

## 2019-04-24 DIAGNOSIS — R209 Unspecified disturbances of skin sensation: Secondary | ICD-10-CM | POA: Insufficient documentation

## 2019-04-24 NOTE — Telephone Encounter (Signed)
-

## 2019-04-24 NOTE — Therapy (Signed)
[place], [place], [address] Phone: [phone]   Fax:  [phone]  Physical Therapy Evaluation  Patient Details  Name: Carolyn Newman MRN: [id number] Date of Birth: 01-May-1951 Referring Provider (PT): MD Heath Lark   Encounter Date: 04/24/2019  PT End of Session - 04/24/19 1357    Visit Number  1    Number of Visits  8    Date for PT Re-Evaluation  05/25/19    PT Start Time  1300    PT Stop Time  1345    PT Time Calculation (min)  45 min    Activity Tolerance  Patient tolerated treatment well    Behavior During Therapy  [facility] for tasks assessed/performed       Past Medical History:  Diagnosis Date  . Anemia   . Cancer ([place])    myeloma  . Hypertension   . Neuropathy    Right and left feet     Past Surgical History:  Procedure Laterality Date  . EYE SURGERY      There were no vitals filed for this visit.   Subjective Assessment - 04/24/19 1322    Subjective  Pt had been having problems with her knees, She is going to [employer] as she had in her past.  She had an epidose last week with tightness in knees and lost 15 pounds of fluid with 4 day episode of diarrhes and her knees feel better she is now able to come from sit to stand without arm support    Pertinent History  PMH: Peripheral neuropathy due to chemotherapy for cancer (myeloma) since 2017  anemia, morbid obesity Had bone marrow transplant in 2018 pt reports thoracic aneurysm and Dr. Gwenlyn Found advised her not to lift heavy weights    Patient Stated Goals  to get stronger and get help with balance    Currently in Pain?  No/denies         [facility] PT Assessment - 04/24/19 0001      Assessment   Medical Diagnosis  gait imbalance, physical debility    Referring Provider (PT)  MD Heath Lark    Next MD Visit  --    Prior Therapy  Pt eval in Oct 2019       Precautions   Precautions  None      Balance Screen   Has the patient  fallen in the past 6 months  No    Has the patient had a decrease in activity level because of a fear of falling?   No    Is the patient reluctant to leave their home because of a fear of falling?   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  some pain in left knee with RPE 5/10       Posture/Postural Control   Posture/Postural Control  Postural limitations    Postural Limitations  Rounded Shoulders;Forward head;Increased lumbar lordosis    Posture Comments  pt has decreased speed of movement with lumbar ROM with some imbalnce, does better with wide base of support       ROM / Strength   AROM / PROM / Strength  Strength      Strength   Overall Strength Comments  LE strength grossly 5/5 MMT except Rt hip flexion and abd 4/5 MMT    Strength Assessment Site  Hand    Right/Left hand  Right;Left    Right Hand Grip (lbs)  78/84/85    Left Hand Grip (lbs)  80/70/70      Ambulation/Gait   Gait Comments  WFL gait pattern and velocity, minor deviation with dyanamic balance and gait but can self correct with no LOB.       Balance   Balance Assessed  Yes      Standardized Balance Assessment   Standardized Balance Assessment  Berg Balance  Test;Dynamic Gait Index;Timed Up and Go Test      Berg Balance Test   Sit to Stand  Able to stand without using hands and stabilize independently    Standing Unsupported  Able to stand safely 2 minutes    Sitting with Back Unsupported but Feet Supported on Floor or Stool  Able to sit safely and securely 2 minutes    Stand to Sit  Sits safely with minimal use of hands    Transfers  Able to transfer safely, minor use of hands    Standing Unsupported with Eyes Closed  Able to stand 10 seconds safely    Standing Unsupported with Feet Together  Able to place feet together independently and stand 1 minute safely   feels a little wobbly, but can do it    From Standing, Reach Forward with Outstretched Arm  Can reach confidently >25 cm (10")    From Standing Position, Pick up Object from Floor  Able to pick up shoe safely and easily    From Standing Position, Turn to Look Behind Over each Shoulder  Looks behind from both sides and weight shifts well    Turn 360 Degrees  Able to turn 360 degrees safely in 4 seconds or less    Standing Unsupported, Alternately Place Feet on Step/Stool  Able to stand independently and safely and complete 8 steps in 20 seconds    Standing Unsupported, One Foot in Front  Able to take small step independently and hold 30 seconds    Standing on One Leg  Tries to lift leg/unable to hold 3 seconds but remains standing independently    Total Score  51      Dynamic Gait Index   Level Surface  Normal    Change in Gait Speed  Normal    Gait with Horizontal Head Turns  Normal    Gait with Vertical Head Turns  Normal    Gait and Pivot Turn  Normal    Step Over Obstacle  Normal    Step Around Obstacles  Normal  Steps  Normal    Total Score  24      Timed Up and Go Test   Normal TUG (seconds)  7.05    Manual TUG (seconds)  8.63    Cognitive TUG (seconds)  7.45                Objective measurements completed on examination: See above findings.      [facility]  Adult PT Treatment/Exercise - 04/24/19 0001      Exercises   Exercises  Other Exercises    Other Exercises   gave pt information about [email or web address] and showed her how to get to active older adult and tai chi classes              PT Education - 04/24/19 1356    Education Details  link to online exercise classes at [facility]) Educated  Patient    Methods  Explanation;Handout    Comprehension  Verbalized understanding          PT Long Term Goals - 04/24/19 1408      PT LONG TERM GOAL #1   Title  Pt will increase # of reps of sit to stand in 30 seconds to 10 with RPE of 3/10    Baseline  8 with RPE of 5/10 on 04/24/2019    Time  4    Period  Weeks    Status  New      PT LONG TERM GOAL #2   Title  Pt will report she is independen in a home exercise program and feels like she can keep it going at home    Time  4    Period  Weeks    Status  New      PT LONG TERM GOAL #3   Title  Pt report she is 50% more confident in her walking stability in her daily activities    Time  4    Period  Weeks    Status  New             Plan - 04/24/19 1357    Clinical Impression Statement  Pt is experiencing feelings of balance instabilty during gait though BERG and TUG scores are good. She does have decreased number of repetitions of sit to stand likely due to dynamic leg weakness.  She also has had a decreased in general activity since working from home and wants to come to PT to increase her strenght and confidence when walking.    Personal Factors and Comorbidities  Comorbidity 3+    Comorbidities  muliple myeloma in remission after bone marrow transplant, ongoing maintenace chemo, joint OA,per pt thoracic aneurysm and advised not to lift weights    Examination-Activity Limitations  Transfers;Locomotion Level    Stability/Clinical Decision Making  Stable/Uncomplicated    Clinical Decision Making  Low    Rehab Potential  Excellent    PT Frequency  2x / week    PT Duration   4 weeks    PT Treatment/Interventions  ADLs/Self Care Home Management;Gait training;Stair training;Functional mobility training;Therapeutic activities;Therapeutic exercise;Balance training;Neuromuscular re-education;Patient/family education    PT Next Visit Plan  begin exercise for core,  LE strength and balance progress HEP limit Valsalva and heavy weight lfiting    Consulted and Agree with Plan of Care  Patient       Patient will benefit from skilled therapeutic intervention in order to improve the following deficits and impairments:  Abnormal gait, Decreased balance,  Decreased endurance, Decreased mobility, Difficulty walking, Decreased activity tolerance, Decreased strength, Postural dysfunction  Visit Diagnosis: Difficulty in walking, not elsewhere classified - Plan: PT plan of care cert/re-cert  Muscle weakness (generalized) - Plan: PT plan of care cert/re-cert  Unspecified disturbances of skin sensation - Plan: PT plan of care cert/re-cert     Problem List Patient Active Problem List   Diagnosis Date Noted  . Leukopenia due to antineoplastic chemotherapy ([place]) 01/11/2019  . Other constipation 01/11/2019  . Elevated serum creatinine 09/18/2018  . Hyperlipidemia 06/23/2018  . Thoracic aortic aneurysm without rupture ([place]) 05/19/2018  . Anosmia 11/15/2017  . Morbid obesity with BMI of 40.0-44.9, adult ([place]) 08/18/2017  . Hypotension due to drugs 04/15/2017  . Hyperglycemia, drug-induced 03/25/2017  . Sinus congestion 03/25/2017  . S/P autologous bone marrow transplantation ([place]) 02/01/2017  . Peripheral neuropathy due to chemotherapy ([place]) 07/13/2016  . Physical debility 07/13/2016  . Allergic rhinitis 07/02/2016  . Rash due to allergy 07/02/2016  . Anemia due to antineoplastic chemotherapy 06/23/2016  . Financial difficulty 05/22/2016  . Pancytopenia, acquired ([place]) 04/19/2016  . Multiple myeloma not having achieved remission ([place]) 04/05/2016  . Multiple myeloma in  remission ([place]) 04/04/2016  . Normocytic anemia 03/09/2016   Donato Heinz. Owens Shark PT  Norwood Levo 04/24/2019, 2:28 PM  [place], [place], [address] Phone: [phone]   Fax:  [phone]  Name: NATASSJA OLLIS MRN: [id number] Date of Birth: Nov 29, 1950

## 2019-04-24 NOTE — Patient Instructions (Signed)
[email or web address] Categories Active older adults or tai chi

## 2019-04-25 ENCOUNTER — Other Ambulatory Visit: Payer: Self-pay | Admitting: Physical Medicine and Rehabilitation

## 2019-04-25 ENCOUNTER — Ambulatory Visit
Admission: RE | Admit: 2019-04-25 | Discharge: 2019-04-25 | Disposition: A | Discharge status: Home / Self Care | Payer: Medicare Other | Source: Ambulatory Visit | Attending: Physical Medicine and Rehabilitation | Admitting: Physical Medicine and Rehabilitation

## 2019-04-25 DIAGNOSIS — M25512 Pain in left shoulder: Secondary | ICD-10-CM

## 2019-04-25 DIAGNOSIS — M25511 Pain in right shoulder: Secondary | ICD-10-CM

## 2019-04-26 ENCOUNTER — Encounter: Payer: Self-pay | Admitting: Physical Therapy

## 2019-05-02 ENCOUNTER — Ambulatory Visit: Payer: Medicare Other

## 2019-05-02 ENCOUNTER — Other Ambulatory Visit: Payer: Self-pay

## 2019-05-02 DIAGNOSIS — R262 Difficulty in walking, not elsewhere classified: Secondary | ICD-10-CM | POA: Diagnosis not present

## 2019-05-02 DIAGNOSIS — M6281 Muscle weakness (generalized): Secondary | ICD-10-CM

## 2019-05-02 DIAGNOSIS — R209 Unspecified disturbances of skin sensation: Secondary | ICD-10-CM

## 2019-05-02 NOTE — Therapy (Signed)
[place], [place], [address] Phone: [phone]   Fax:  [phone]  Physical Therapy Treatment  Patient Details  Name: Carolyn Newman MRN: [id number] Date of Birth: 01-15-1951 Referring Provider (PT): MD Heath Lark   Encounter Date: 05/02/2019  PT End of Session - 05/02/19 1212    Visit Number  2    Number of Visits  8    Date for PT Re-Evaluation  05/25/19    PT Start Time  8756    PT Stop Time  1159    PT Time Calculation (min)  54 min    Activity Tolerance  Patient tolerated treatment well    Behavior During Therapy  [facility] for tasks assessed/performed       Past Medical History:  Diagnosis Date  . Anemia   . Cancer ([place])    myeloma  . Hypertension   . Neuropathy    Right and left feet     Past Surgical History:  Procedure Laterality Date  . EYE SURGERY      There were no vitals filed for this visit.  Subjective Assessment - 05/02/19 1110    Subjective  I've started my 3 rounds of shots in my knees at [facility] so this week will be my second round and I can already tell an improvement. in my knee pain. Today they just feel stiff.    Pertinent History  PMH: Peripheral neuropathy due to chemotherapy for cancer (myeloma) since 2017  anemia, morbid obesity Had bone marrow transplant in 2018 pt reports thoracic aneurysm and Dr. Gwenlyn Found advised her not to lift heavy weights    Patient Stated Goals  to get stronger and get help with balance    Currently in Pain?  No/denies   knees just feel stiff today                      [facility] Adult PT Treatment/Exercise - 05/02/19 0001      Neuro Re-ed    Neuro Re-ed Details   In // bars: Heel-toe walking front and retro, slow and controlled high knee marching 2x for each, then bil sidestepping with squat, and bil braiding 1x each and were less challegning for pt; standing on airex with 3 lbs on each ankle for 3 way hip raises x10 each  with tacile cues for correct technique with abduction , then mini squats on airex returning therapist demo for correct technique x10. No increased knee pain reported      Lumbar Exercises: Supine   Pelvic Tilt  10 reps;5 seconds    Clam  10 reps   with posterior pelvic tilt   Clam Limitations  needed to "reset tilt" 2x during this activity    Heel Slides  10 reps   with posterior pelvic tilt   Heel Slides Limitations  had to "reset tilt" multiple times , pt reports this one most challenging    Bent Knee Raise  10 reps   holding posterior pelvic tilit   Bent Knee Raise Limitations  less callenging to hold tilt      Knee/Hip Exercises: Stretches   Passive Hamstring Stretch  Right;Left;2 reps;20 seconds      Knee/Hip Exercises: Aerobic   Nustep  Level 6, x10 mins with therapist present to monitor pt      Knee/Hip Exercises: Standing   Heel Raises  Both;10 reps    Heel Raises Limitations  VCs for pt to equally weight  bearing as she was supinating, and to work towards full ROM. Pt was surprised at how cheallenging these were for her.     Other Standing Knee Exercises  Practiced at counter bil SLS and bil tandem stance with eyes open and eyes closed, she will try these at home on her cushion once she improves with EO and EC first.             PT Education - 05/02/19 1207    Education Details  Educated pt in balance activities, supine core and standing hip 3 way raises on cushion she has at home for increased challenge    Person(s) Educated  Patient    Methods  Explanation;Demonstration;Handout    Comprehension  Verbalized understanding;Returned demonstration;Tactile cues required          PT Long Term Goals - 04/24/19 1408      PT LONG TERM GOAL #1   Title  Pt will increase # of reps of sit to stand in 30 seconds to 10 with RPE of 3/10    Baseline  8 with RPE of 5/10 on 04/24/2019    Time  4    Period  Weeks    Status  New      PT LONG TERM GOAL #2   Title  Pt will  report she is independen in a home exercise program and feels like she can keep it going at home    Time  4    Period  Weeks    Status  New      PT LONG TERM GOAL #3   Title  Pt report she is 50% more confident in her walking stability in her daily activities    Time  4    Period  Weeks    Status  New            Plan - 05/02/19 1212    Clinical Impression Statement  Overall pt tolerated new exercises and balance activities very well today without increased knee pain. She does require multiple tactile and VCs for correct LE positioning throughout due to weakness she demonstrates in hips and ankles (see flowsheet for specifics). Pt was able to veralize good understanding of corrections and then return correct demo as much as able. She reports feeling challenged by activities today and looks forward to beginning these at home.    Personal Factors and Comorbidities  Comorbidity 3+    Comorbidities  muliple myeloma in remission after bone marrow transplant, ongoing maintenace chemo, joint OA,per pt thoracic aneurysm and advised not to lift weights    Examination-Activity Limitations  Transfers;Locomotion Level    Stability/Clinical Decision Making  Stable/Uncomplicated    Rehab Potential  Excellent    PT Frequency  2x / week    PT Duration  4 weeks    PT Treatment/Interventions  ADLs/Self Care Home Management;Gait training;Stair training;Functional mobility training;Therapeutic activities;Therapeutic exercise;Balance training;Neuromuscular re-education;Patient/family education    PT Next Visit Plan  Cont core exercises and review prn, cont LE strength and balance progress HEP limit Valsalva and heavy weight lfiting. Review HEP issued today assessing for technique    PT Home Exercise Plan  Balance, bil LE and core exercises    Consulted and Agree with Plan of Care  Patient       Patient will benefit from skilled therapeutic intervention in order to improve the following deficits and  impairments:  Abnormal gait, Decreased balance, Decreased endurance, Decreased mobility, Difficulty walking, Decreased activity tolerance, Decreased strength,  Postural dysfunction  Visit Diagnosis: Difficulty in walking, not elsewhere classified  Muscle weakness (generalized)  Unspecified disturbances of skin sensation     Problem List Patient Active Problem List   Diagnosis Date Noted  . Leukopenia due to antineoplastic chemotherapy ([place]) 01/11/2019  . Other constipation 01/11/2019  . Elevated serum creatinine 09/18/2018  . Hyperlipidemia 06/23/2018  . Thoracic aortic aneurysm without rupture ([place]) 05/19/2018  . Anosmia 11/15/2017  . Morbid obesity with BMI of 40.0-44.9, adult ([place]) 08/18/2017  . Hypotension due to drugs 04/15/2017  . Hyperglycemia, drug-induced 03/25/2017  . Sinus congestion 03/25/2017  . S/P autologous bone marrow transplantation ([place]) 02/01/2017  . Peripheral neuropathy due to chemotherapy ([place]) 07/13/2016  . Physical debility 07/13/2016  . Allergic rhinitis 07/02/2016  . Rash due to allergy 07/02/2016  . Anemia due to antineoplastic chemotherapy 06/23/2016  . Financial difficulty 05/22/2016  . Pancytopenia, acquired ([place]) 04/19/2016  . Multiple myeloma not having achieved remission ([place]) 04/05/2016  . Multiple myeloma in remission ([place]) 04/04/2016  . Normocytic anemia 03/09/2016    Otelia Limes, PTA 05/02/2019, 12:18 PM  [place], [place], [address] Phone: [phone]   Fax:  [phone]  Name: CHARLOT GOUIN MRN: [id number] Date of Birth: 09-19-1950

## 2019-05-02 NOTE — Patient Instructions (Addendum)
Heel Raise: Bilateral (Standing)   Cancer Rehab [phone]    Stand near counter for fingertip support if needed. Rise on balls of feet. Repeat __10-20__ times per set. Do _1-2___ sets per session. Do __2__ sessions per day.  SINGLE LIMB STANCE    Stand at counter (corner if you have one) for minimal arm support. Raise leg. Hold _10-20__ seconds. Repeat with other leg. Once this becomes easier, for increased challenge, close eyes with fingertips on counter for safety, and then progress to standing on cushion. _3-5__ reps per set, _2-3__ sets per day.  Tandem Stance    Stand at counter (corner if you have one). Right foot in front of left, heel touching toe both feet "straight ahead". Stand on Foot Triangle of Support with both feet. Balance in this position _10-20__ seconds. Do with left foot in front of right. Once this becomes easier, for increased challenge, close eyes with fingertips on counter for safety, and then can standing on cushion.  Step-Up: Forward    Move step-stool to counter or hold rail if at steps, but as little as able. Step up forward keeping opposite foot off step. Keep pelvis level and back straight. Hold the single leg stand for 3 seconds, then come back down slowly.  Do _10__ times, do _1-2_ sets, on each leg, _1-2__ times per day.   STAND ON CUSHION SEAT FOR FOLLOWING:       HIP: Flexion Standing    Holding onto counter lift leg straight in front keeping knee straight. Slow and controlled! _10__ reps per set, _2-3__ sets per day. Then repeat with other leg.   Hip Extension (Standing)    Stand with support at counter. Squeeze pelvic floor and hold so as not to twist hips and don't lean forward.  Move right leg backward with straight knee. Slow and controlled! Repeat _10__ times. Do _2-3__ times a day. Repeat with other leg.  Hip Abduction (Standing)    Stand with support. Squeeze pelvic floor and hold. Lift right leg out to side, keeping toe  forward.  Repeat _10__ times. Do _2-3__ times a day. Repeat with other leg.    Pelvic Tilt    Flatten back by tightening stomach muscles and buttocks. Repeat __10__ times per set. Do _1-2___ sets per session. Do __1__ sessions per day. Then holding pelvic tilt for following:  1. Clam (opening and closing bent knees) 2. Heel Slides alternating legs 3. Alternating marching legs keeping knees bent  Bridging    Slowly raise buttocks from floor, keeping stomach tight. Repeat __10__ times per set, holding for __5__seconds. Do _1-2___ sets per session. Do __1__ sessions per day.

## 2019-05-04 ENCOUNTER — Ambulatory Visit: Payer: Medicare Other | Admitting: Rehabilitation

## 2019-05-04 ENCOUNTER — Other Ambulatory Visit: Payer: Self-pay

## 2019-05-04 ENCOUNTER — Encounter: Payer: Self-pay | Admitting: Rehabilitation

## 2019-05-04 DIAGNOSIS — R262 Difficulty in walking, not elsewhere classified: Secondary | ICD-10-CM | POA: Diagnosis not present

## 2019-05-04 DIAGNOSIS — M6281 Muscle weakness (generalized): Secondary | ICD-10-CM

## 2019-05-04 DIAGNOSIS — R209 Unspecified disturbances of skin sensation: Secondary | ICD-10-CM

## 2019-05-04 NOTE — Therapy (Signed)
Carolyn Newman, [place], [address] Phone: [phone]   Fax:  [phone]  Physical Therapy Treatment  Patient Details  Name: Carolyn Newman MRN: [id number] Date of Birth: 1951-02-10 Referring Provider (PT): MD Heath Lark   Encounter Date: 05/04/2019  PT End of Session - 05/04/19 0846    Visit Number  3    Number of Visits  8    Date for PT Re-Evaluation  05/25/19    PT Start Time  0800    PT Stop Time  0845    PT Time Calculation (min)  45 min    Activity Tolerance  Patient tolerated treatment well    Behavior During Therapy  [facility] for tasks assessed/performed       Past Medical History:  Diagnosis Date  . Anemia   . Cancer ([place])    myeloma  . Hypertension   . Neuropathy    Right and left feet     Past Surgical History:  Procedure Laterality Date  . EYE SURGERY      There were no vitals filed for this visit.  Subjective Assessment - 05/04/19 0804    Subjective  I had my shots again yesterday. both knees.  I have been doing my exercises at home.    Pertinent History  PMH: Peripheral neuropathy due to chemotherapy for cancer (myeloma) since 2017  anemia, morbid obesity Had bone marrow transplant in 2018 pt reports thoracic aneurysm and Dr. Gwenlyn Found advised her not to lift heavy weights    Currently in Pain?  Yes    Pain Score  6     Pain Location  Knee    Pain Orientation  Right;Left    Pain Descriptors / Indicators  Aching    Pain Type  Chronic pain    Pain Onset  More than a month ago    Pain Frequency  Intermittent    Aggravating Factors   weather, increased activity                       [facility] Adult PT Treatment/Exercise - 05/04/19 0001      Neuro Re-ed    Neuro Re-ed Details   in parallel bars: on therapad 3 way hip 2# weight x 10 each bilateral, SL stance work on therapad, braiding x 2 R and L, then rocker board balance and then rocking forward and backward and right  and left with CGA but no LOB.  Cone step overs x 6 cones and lateral step overs x 6 cones      Lumbar Exercises: Supine   Clam  10 reps    Clam Limitations  red band    Bridge  10 reps    Straight Leg Raise  10 reps    Straight Leg Raises Limitations  both      Knee/Hip Exercises: Stretches   Passive Hamstring Stretch  Right;Left;2 reps;20 seconds    Passive Hamstring Stretch Limitations  with stretch strap       Knee/Hip Exercises: Aerobic   Nustep  Level 6, x10 mins with therapist present to monitor pt      Knee/Hip Exercises: Standing   Heel Raises  10 reps                  PT Long Term Goals - 04/24/19 1408      PT LONG TERM GOAL #1   Title  Pt will increase # of reps of sit  to stand in 30 seconds to 10 with RPE of 3/10    Baseline  8 with RPE of 5/10 on 04/24/2019    Time  4    Period  Weeks    Status  New      PT LONG TERM GOAL #2   Title  Pt will report she is independen in a home exercise program and feels like she can keep it going at home    Time  4    Period  Weeks    Status  New      PT LONG TERM GOAL #3   Title  Pt report she is 50% more confident in her walking stability in her daily activities    Time  4    Period  Weeks    Status  New            Plan - 05/04/19 0846    Clinical Impression Statement  Pt performs all balance activities very well almost without challenge but reports it just feels weird.  Had a discussion about her balance working correctly but getitng used to the new sensation input form the feet for more confidence.  Had some increased knee pain from her injection and weather but still performed all exercises.  Avoided squating activities today.    PT Frequency  2x / week    PT Duration  4 weeks    PT Treatment/Interventions  ADLs/Self Care Home Management;Gait training;Stair training;Functional mobility training;Therapeutic activities;Therapeutic exercise;Balance training;Neuromuscular re-education;Patient/family education     PT Next Visit Plan  Cont LE strength and high level balance. limit Valsalva and heavy weight lfiting.    PT Home Exercise Plan  Balance, bil LE and core exercises    Consulted and Agree with Plan of Care  Patient       Patient will benefit from skilled therapeutic intervention in order to improve the following deficits and impairments:     Visit Diagnosis: Difficulty in walking, not elsewhere classified  Muscle weakness (generalized)  Unspecified disturbances of skin sensation     Problem List Patient Active Problem List   Diagnosis Date Noted  . Leukopenia due to antineoplastic chemotherapy ([place]) 01/11/2019  . Other constipation 01/11/2019  . Elevated serum creatinine 09/18/2018  . Hyperlipidemia 06/23/2018  . Thoracic aortic aneurysm without rupture ([place]) 05/19/2018  . Anosmia 11/15/2017  . Morbid obesity with BMI of 40.0-44.9, adult ([place]) 08/18/2017  . Hypotension due to drugs 04/15/2017  . Hyperglycemia, drug-induced 03/25/2017  . Sinus congestion 03/25/2017  . S/P autologous bone marrow transplantation ([place]) 02/01/2017  . Peripheral neuropathy due to chemotherapy ([place]) 07/13/2016  . Physical debility 07/13/2016  . Allergic rhinitis 07/02/2016  . Rash due to allergy 07/02/2016  . Anemia due to antineoplastic chemotherapy 06/23/2016  . Financial difficulty 05/22/2016  . Pancytopenia, acquired ([place]) 04/19/2016  . Multiple myeloma not having achieved remission ([place]) 04/05/2016  . Multiple myeloma in remission ([place]) 04/04/2016  . Normocytic anemia 03/09/2016    Stark Bray 05/04/2019, 8:48 AM  [place], [place], [address] Phone: [phone]   Fax:  [phone]  Name: Carolyn Newman MRN: [id number] Date of Birth: 1950-12-21

## 2019-05-08 ENCOUNTER — Other Ambulatory Visit: Payer: Self-pay

## 2019-05-08 ENCOUNTER — Ambulatory Visit: Payer: Medicare Other | Admitting: Physical Therapy

## 2019-05-08 DIAGNOSIS — R262 Difficulty in walking, not elsewhere classified: Secondary | ICD-10-CM | POA: Diagnosis not present

## 2019-05-08 DIAGNOSIS — M6281 Muscle weakness (generalized): Secondary | ICD-10-CM

## 2019-05-08 DIAGNOSIS — R209 Unspecified disturbances of skin sensation: Secondary | ICD-10-CM

## 2019-05-08 NOTE — Therapy (Signed)
[place], [place], [address] Phone: [phone]   Fax:  [phone]  Physical Therapy Treatment  Patient Details  Name: Carolyn Newman MRN: [id number] Date of Birth: 01-Apr-1951 Referring Provider (PT): MD Heath Lark   Encounter Date: 05/08/2019  PT End of Session - 05/08/19 1714    Visit Number  4    Number of Visits  8    Date for PT Re-Evaluation  05/25/19    PT Start Time  1500    PT Stop Time  1545    PT Time Calculation (min)  45 min    Activity Tolerance  Patient tolerated treatment well    Behavior During Therapy  [facility] for tasks assessed/performed       Past Medical History:  Diagnosis Date  . Anemia   . Cancer ([place])    myeloma  . Hypertension   . Neuropathy    Right and left feet     Past Surgical History:  Procedure Laterality Date  . EYE SURGERY      There were no vitals filed for this visit.  Subjective Assessment - 05/08/19 1507    Subjective  Pt states she is feeling a little better. She has been doing the balance exercises at home.  She had her final injection in her left knee today and will have the last one in her right knee tomorrow.  She had both shoulders xrayed and is waiting for the results    Pertinent History  PMH: Peripheral neuropathy due to chemotherapy for cancer (myeloma) since 2017  anemia, morbid obesity Had bone marrow transplant in 2018 pt reports thoracic aneurysm and Dr. Gwenlyn Found advised her not to lift heavy weights    Patient Stated Goals  to get stronger and get help with balance    Currently in Pain?  No/denies   just a little stiffness in her knees, no pain                      [facility] Adult PT Treatment/Exercise - 05/08/19 0001      Exercises   Exercises  Neck;Shoulder;Lumbar;Knee/Hip;Ankle      Neck Exercises: Seated   Other Seated Exercise  neck and scapular ROM for warm up       Lumbar Exercises: Standing   Row   Strengthening;Right;Left;Both;10 reps    Theraband Level (Row)  Level 2 (Red)    Row Limitations  10 reps bilaterally, then 10 reps alternating with each arm       Lumbar Exercises: Supine   Clam  10 reps    Bent Knee Raise  10 reps    Bent Knee Raise Limitations  cues to keep pelvis stable     Straight Leg Raise  10 reps    Straight Leg Raises Limitations  both      Lumbar Exercises: Sidelying   Hip Abduction  Both;10 reps    Other Sidelying Lumbar Exercises  knee to chest with dorsi/plantar flexion  x 10 reps    min assist      Knee/Hip Exercises: Standing   Heel Raises  10 reps    Heel Raises Limitations  both feet with no shoes on  holding 2 dowel rods for balance with cues to keep ankle in midline as they tend to roll into supination especially on the left     Hip Abduction  Stengthening;Right;Left    Abduction Limitations  cues to slow down and keep  erect posture to increase effect of exercise     Other Standing Knee Exercises  standing weight shift forward and back with no shoes on to assess reactions in foot /ankle/ toes       Shoulder Exercises: Standing   External Rotation  Strengthening;Right;Left;10 reps                  PT Long Term Goals - 04/24/19 1408      PT LONG TERM GOAL #1   Title  Pt will increase # of reps of sit to stand in 30 seconds to 10 with RPE of 3/10    Baseline  8 with RPE of 5/10 on 04/24/2019    Time  4    Period  Weeks    Status  New      PT LONG TERM GOAL #2   Title  Pt will report she is independen in a home exercise program and feels like she can keep it going at home    Time  4    Period  Weeks    Status  New      PT LONG TERM GOAL #3   Title  Pt report she is 50% more confident in her walking stability in her daily activities    Time  4    Period  Weeks    Status  New            Plan - 05/08/19 1715    Clinical Impression Statement  Treatment focused on ankle stability exercise with standing and mat  strengthening.  Pt did well today and improved the quality of her movment with cognitive attention to cues and feedback.  She feels that she is doing home exercises well and wants to focus on core exercises next session as that might doing her last session.    Comorbidities  muliple myeloma in remission after bone marrow transplant, ongoing maintenace chemo, joint OA,per pt thoracic aneurysm and advised not to lift weights    PT Frequency  2x / week    PT Duration  4 weeks    PT Treatment/Interventions  ADLs/Self Care Home Management;Gait training;Stair training;Functional mobility training;Therapeutic activities;Therapeutic exercise;Balance training;Neuromuscular re-education;Patient/family education    PT Next Visit Plan  check goals for discharge. Focus on core exercises and finalize HEP Cont LE strength and high level balance. limit Valsalva and heavy weight lfiting.Discharge    Consulted and Agree with Plan of Care  Patient       Patient will benefit from skilled therapeutic intervention in order to improve the following deficits and impairments:  Abnormal gait, Decreased balance, Decreased endurance, Decreased mobility, Difficulty walking, Decreased activity tolerance, Decreased strength, Postural dysfunction  Visit Diagnosis: Difficulty in walking, not elsewhere classified  Muscle weakness (generalized)  Unspecified disturbances of skin sensation     Problem List Patient Active Problem List   Diagnosis Date Noted  . Leukopenia due to antineoplastic chemotherapy ([place]) 01/11/2019  . Other constipation 01/11/2019  . Elevated serum creatinine 09/18/2018  . Hyperlipidemia 06/23/2018  . Thoracic aortic aneurysm without rupture ([place]) 05/19/2018  . Anosmia 11/15/2017  . Morbid obesity with BMI of 40.0-44.9, adult ([place]) 08/18/2017  . Hypotension due to drugs 04/15/2017  . Hyperglycemia, drug-induced 03/25/2017  . Sinus congestion 03/25/2017  . S/P autologous bone marrow  transplantation ([place]) 02/01/2017  . Peripheral neuropathy due to chemotherapy ([place]) 07/13/2016  . Physical debility 07/13/2016  . Allergic rhinitis 07/02/2016  . Rash due to allergy 07/02/2016  . Anemia  due to antineoplastic chemotherapy 06/23/2016  . Financial difficulty 05/22/2016  . Pancytopenia, acquired ([place]) 04/19/2016  . Multiple myeloma not having achieved remission ([place]) 04/05/2016  . Multiple myeloma in remission ([place]) 04/04/2016  . Normocytic anemia 03/09/2016   Donato Heinz. Owens Shark PT  Norwood Levo 05/08/2019, 5:18 PM  [place], [place], [address] Phone: [phone]   Fax:  [phone]  Name: Carolyn Newman MRN: [id number] Date of Birth: October 20, 1950

## 2019-05-10 ENCOUNTER — Ambulatory Visit: Payer: Medicare Other | Admitting: Rehabilitation

## 2019-05-10 ENCOUNTER — Other Ambulatory Visit: Payer: Self-pay

## 2019-05-10 ENCOUNTER — Encounter: Payer: Self-pay | Admitting: Rehabilitation

## 2019-05-10 DIAGNOSIS — M6281 Muscle weakness (generalized): Secondary | ICD-10-CM

## 2019-05-10 DIAGNOSIS — R262 Difficulty in walking, not elsewhere classified: Secondary | ICD-10-CM | POA: Diagnosis not present

## 2019-05-10 DIAGNOSIS — R209 Unspecified disturbances of skin sensation: Secondary | ICD-10-CM

## 2019-05-10 NOTE — Therapy (Signed)
[place], [place], [address] Phone: [phone]   Fax:  [phone]  Physical Therapy Treatment  Patient Details  Name: Carolyn Newman MRN: [id number] Date of Birth: [birth year] Referring Provider (PT): MD Heath Lark   Encounter Date: 05/10/2019  PT End of Session - 05/10/19 1012    Visit Number  5    Number of Visits  8    Date for PT Re-Evaluation  05/25/19    PT Start Time  1000    PT Stop Time  1042    PT Time Calculation (min)  42 min    Activity Tolerance  Patient tolerated treatment well    Behavior During Therapy  [facility] for tasks assessed/performed       Past Medical History:  Diagnosis Date  . Anemia   . Cancer ([place])    myeloma  . Hypertension   . Neuropathy    Right and left feet     Past Surgical History:  Procedure Laterality Date  . EYE SURGERY      There were no vitals filed for this visit.  Subjective Assessment - 05/10/19 1003    Subjective  I got a nerve block in each shoulder yesterday so I am not supposed to do anything with my arms.  For home I know that I just need to do more.  I may do the wii again.    Pertinent History  PMH: Peripheral neuropathy due to chemotherapy for cancer (myeloma) since 2017  anemia, morbid obesity Had bone marrow transplant in 2018 pt reports thoracic aneurysm and Dr. Gwenlyn Found advised her not to lift heavy weights    Patient Stated Goals  to get stronger and get help with balance    Currently in Pain?  No/denies                       [facility] Adult PT Treatment/Exercise - 05/10/19 0001      Lumbar Exercises: Supine   Ab Set  10 reps;5 seconds    Pelvic Tilt  10 reps    Pelvic Tilt Limitations  with cueing for movement    Clam  10 reps    Clam Limitations  red band    Bent Knee Raise  10 reps    Bent Knee Raise Limitations  cueing for netural pelvis and TrA activation    Bridge  10 reps    Bridge Limitations  between pelvic  tilt exercises    Straight Leg Raise  10 reps    Straight Leg Raises Limitations  both with vcs to go slowly     Other Supine Lumbar Exercises  hooklying ball squeeze 6" x 10 with TrA activation and netural pelvis      Lumbar Exercises: Sidelying   Clam  10 reps;Both    Hip Abduction  10 reps;Both    Hip Abduction Limitations  with tcs to get leg in correct alignment      Knee/Hip Exercises: Aerobic   Nustep  level 3 x 43mn; unable to do arms today due to injections                  PT Long Term Goals - 05/10/19 1005      PT LONG TERM GOAL #1   Title  Pt will increase # of reps of sit to stand in 30 seconds to 10 with RPE of 3/10    Baseline  8 with RPE of  5/10 on 04/24/2019,   able to do 10 in 24 seconds today    Status  Achieved      PT LONG TERM GOAL #2   Title  Pt will report she is independen in a home exercise program and feels like she can keep it going at home    Status  Achieved      PT LONG TERM GOAL #3   Title  Pt report she is 50% more confident in her walking stability in her daily activities    Baseline  I feel about 80%    Status  Achieved            Plan - 05/10/19 1038    Clinical Impression Statement  Pt has met all goals and feels ind with her HEP at this time.  Her knee pain has decreased with her injections and her mobility has also improved.  Still feeling about 80% in terms of stability but much improved and feeling good with independent work.    PT Frequency  2x / week    PT Duration  4 weeks    Consulted and Agree with Plan of Care  Patient       Patient will benefit from skilled therapeutic intervention in order to improve the following deficits and impairments:     Visit Diagnosis: Difficulty in walking, not elsewhere classified  Muscle weakness (generalized)  Unspecified disturbances of skin sensation     Problem List Patient Active Problem List   Diagnosis Date Noted  . Leukopenia due to antineoplastic chemotherapy  ([place]) 01/11/2019  . Other constipation 01/11/2019  . Elevated serum creatinine 09/18/2018  . Hyperlipidemia 06/23/2018  . Thoracic aortic aneurysm without rupture ([place]) 05/19/2018  . Anosmia 11/15/2017  . Morbid obesity with BMI of 40.0-44.9, adult ([place]) 08/18/2017  . Hypotension due to drugs 04/15/2017  . Hyperglycemia, drug-induced 03/25/2017  . Sinus congestion 03/25/2017  . S/P autologous bone marrow transplantation ([place]) 02/01/2017  . Peripheral neuropathy due to chemotherapy ([place]) 07/13/2016  . Physical debility 07/13/2016  . Allergic rhinitis 07/02/2016  . Rash due to allergy 07/02/2016  . Anemia due to antineoplastic chemotherapy 06/23/2016  . Financial difficulty 05/22/2016  . Pancytopenia, acquired ([place]) 04/19/2016  . Multiple myeloma not having achieved remission ([place]) 04/05/2016  . Multiple myeloma in remission ([place]) 04/04/2016  . Normocytic anemia 03/09/2016    Stark Bray 05/10/2019, 10:39 AM  [place], [place], [address] Phone: [phone]   Fax:  [phone]  Name: Carolyn Newman MRN: [id number] Date of Birth: October 27, 1950  PHYSICAL THERAPY DISCHARGE SUMMARY  Visits from Start of Care: 5  Current functional level related to goals / functional outcomes: See above   Remaining deficits: Need for continued strength and mobility   Education / Equipment: Final HEP  Plan: Patient agrees to discharge.  Patient goals were met. Patient is being discharged due to meeting the stated rehab goals.  ?????

## 2019-05-14 ENCOUNTER — Encounter: Payer: Self-pay | Admitting: Hematology and Oncology

## 2019-05-21 ENCOUNTER — Encounter: Payer: Self-pay | Admitting: Hematology and Oncology

## 2019-05-23 ENCOUNTER — Other Ambulatory Visit: Payer: Self-pay | Admitting: Hematology and Oncology

## 2019-05-23 NOTE — Telephone Encounter (Signed)
Pls refill electronically °

## 2019-06-04 DIAGNOSIS — Z0000 Encounter for general adult medical examination without abnormal findings: Secondary | ICD-10-CM | POA: Diagnosis not present

## 2019-06-13 ENCOUNTER — Encounter: Payer: Self-pay | Admitting: Hematology and Oncology

## 2019-06-18 ENCOUNTER — Other Ambulatory Visit: Payer: Self-pay | Admitting: Hematology and Oncology

## 2019-06-18 NOTE — Telephone Encounter (Signed)
Pls refill electronically °

## 2019-07-17 ENCOUNTER — Inpatient Hospital Stay: Payer: Medicare PPO

## 2019-07-17 ENCOUNTER — Telehealth: Payer: Self-pay | Admitting: Hematology and Oncology

## 2019-07-17 ENCOUNTER — Inpatient Hospital Stay: Payer: Medicare PPO | Attending: Hematology and Oncology | Admitting: Hematology and Oncology

## 2019-07-17 ENCOUNTER — Other Ambulatory Visit: Payer: Self-pay

## 2019-07-17 DIAGNOSIS — T451X5A Adverse effect of antineoplastic and immunosuppressive drugs, initial encounter: Secondary | ICD-10-CM

## 2019-07-17 DIAGNOSIS — C9000 Multiple myeloma not having achieved remission: Secondary | ICD-10-CM

## 2019-07-17 DIAGNOSIS — C9001 Multiple myeloma in remission: Secondary | ICD-10-CM

## 2019-07-17 DIAGNOSIS — Z95828 Presence of other vascular implants and grafts: Secondary | ICD-10-CM

## 2019-07-17 DIAGNOSIS — G620 Drug-induced polyneuropathy: Secondary | ICD-10-CM

## 2019-07-17 LAB — COMPREHENSIVE METABOLIC PANEL
ALT: 11 U/L (ref 0–44)
AST: 18 U/L (ref 15–41)
Albumin: 3.4 g/dL — ABNORMAL LOW (ref 3.5–5.0)
Alkaline Phosphatase: 78 U/L (ref 38–126)
Anion gap: 8 (ref 5–15)
BUN: 20 mg/dL (ref 8–23)
CO2: 28 mmol/L (ref 22–32)
Calcium: 9.1 mg/dL (ref 8.9–10.3)
Chloride: 103 mmol/L (ref 98–111)
Creatinine, Ser: 1.02 mg/dL — ABNORMAL HIGH (ref 0.44–1.00)
GFR calc Af Amer: >60 mL/min (ref >60)
GFR calc non Af Amer: 57 mL/min — ABNORMAL LOW (ref >60)
Glucose, Bld: 77 mg/dL (ref 70–99)
Potassium: 3.7 mmol/L (ref 3.5–5.1)
Sodium: 139 mmol/L (ref 135–145)
Total Bilirubin: 0.9 mg/dL (ref 0.3–1.2)
Total Protein: 7.7 g/dL (ref 6.5–8.1)

## 2019-07-17 LAB — CBC WITH DIFFERENTIAL/PLATELET
Abs Immature Granulocytes: 0.02 10*3/uL (ref 0.00–0.07)
Basophils Absolute: 0.1 10*3/uL (ref 0.0–0.1)
Basophils Relative: 3 %
Eosinophils Absolute: 0.2 10*3/uL (ref 0.0–0.5)
Eosinophils Relative: 5 %
HCT: 39.4 % (ref 36.0–46.0)
Hemoglobin: 12.2 g/dL (ref 12.0–15.0)
Immature Granulocytes: 0 %
Lymphocytes Relative: 24 %
Lymphs Abs: 1.1 10*3/uL (ref 0.7–4.0)
MCH: 28.2 pg (ref 26.0–34.0)
MCHC: 31 g/dL (ref 30.0–36.0)
MCV: 91 fL (ref 80.0–100.0)
Monocytes Absolute: 0.6 10*3/uL (ref 0.1–1.0)
Monocytes Relative: 13 %
Neutro Abs: 2.5 10*3/uL (ref 1.7–7.7)
Neutrophils Relative %: 55 %
Platelets: 251 10*3/uL (ref 150–400)
RBC: 4.33 MIL/uL (ref 3.87–5.11)
RDW: 15.8 % — ABNORMAL HIGH (ref 11.5–15.5)
WBC: 4.5 10*3/uL (ref 4.0–10.5)
nRBC: 0 % (ref 0.0–0.2)

## 2019-07-17 MED ORDER — ZOLEDRONIC ACID 4 MG/100ML IV SOLN
4.0000 mg | Freq: Once | INTRAVENOUS | Status: AC
  Administered 2019-07-17: 11:00:00 4 mg via INTRAVENOUS

## 2019-07-17 MED ORDER — ZOLEDRONIC ACID 4 MG/100ML IV SOLN
INTRAVENOUS | Status: AC
  Filled 2019-07-17: qty 100

## 2019-07-17 MED ORDER — SODIUM CHLORIDE 0.9 % IV SOLN
INTRAVENOUS | Status: DC
  Filled 2019-07-17: qty 250

## 2019-07-17 NOTE — Patient Instructions (Signed)
Zoledronic Acid injection (Hypercalcemia, Oncology) What is this medicine? ZOLEDRONIC ACID (ZOE le dron ik AS id) lowers the amount of calcium loss from bone. It is used to treat too much calcium in your blood from cancer. It is also used to prevent complications of cancer that has spread to the bone. This medicine may be used for other purposes; ask your health care provider or pharmacist if you have questions. COMMON BRAND NAME(S): Zometa What should I tell my health care provider before I take this medicine? They need to know if you have any of these conditions:  aspirin-sensitive asthma  cancer, especially if you are receiving medicines used to treat cancer  dental disease or wear dentures  infection  kidney disease  receiving corticosteroids like dexamethasone or prednisone  an unusual or allergic reaction to zoledronic acid, other medicines, foods, dyes, or preservatives  pregnant or trying to get pregnant  breast-feeding How should I use this medicine? This medicine is for infusion into a vein. It is given by a health care professional in a hospital or clinic setting. Talk to your pediatrician regarding the use of this medicine in children. Special care may be needed. Overdosage: If you think you have taken too much of this medicine contact a poison control center or emergency room at once. NOTE: This medicine is only for you. Do not share this medicine with others. What if I miss a dose? It is important not to miss your dose. Call your doctor or health care professional if you are unable to keep an appointment. What may interact with this medicine?  certain antibiotics given by injection  NSAIDs, medicines for pain and inflammation, like ibuprofen or naproxen  some diuretics like bumetanide, furosemide  teriparatide  thalidomide This list may not describe all possible interactions. Give your health care provider a list of all the medicines, herbs, non-prescription  drugs, or dietary supplements you use. Also tell them if you smoke, drink alcohol, or use illegal drugs. Some items may interact with your medicine. What should I watch for while using this medicine? Visit your doctor or health care professional for regular checkups. It may be some time before you see the benefit from this medicine. Do not stop taking your medicine unless your doctor tells you to. Your doctor may order blood tests or other tests to see how you are doing. Women should inform their doctor if they wish to become pregnant or think they might be pregnant. There is a potential for serious side effects to an unborn child. Talk to your health care professional or pharmacist for more information. You should make sure that you get enough calcium and vitamin D while you are taking this medicine. Discuss the foods you eat and the vitamins you take with your health care professional. Some people who take this medicine have severe bone, joint, and/or muscle pain. This medicine may also increase your risk for jaw problems or a broken thigh bone. Tell your doctor right away if you have severe pain in your jaw, bones, joints, or muscles. Tell your doctor if you have any pain that does not go away or that gets worse. Tell your dentist and dental surgeon that you are taking this medicine. You should not have major dental surgery while on this medicine. See your dentist to have a dental exam and fix any dental problems before starting this medicine. Take good care of your teeth while on this medicine. Make sure you see your dentist for regular follow-up   appointments. What side effects may I notice from receiving this medicine? Side effects that you should report to your doctor or health care professional as soon as possible:  allergic reactions like skin rash, itching or hives, swelling of the face, lips, or tongue  anxiety, confusion, or depression  breathing problems  changes in vision  eye  pain  feeling faint or lightheaded, falls  jaw pain, especially after dental work  mouth sores  muscle cramps, stiffness, or weakness  redness, blistering, peeling or loosening of the skin, including inside the mouth  trouble passing urine or change in the amount of urine Side effects that usually do not require medical attention (report to your doctor or health care professional if they continue or are bothersome):  bone, joint, or muscle pain  constipation  diarrhea  fever  hair loss  irritation at site where injected  loss of appetite  nausea, vomiting  stomach upset  trouble sleeping  trouble swallowing  weak or tired This list may not describe all possible side effects. Call your doctor for medical advice about side effects. You may report side effects to FDA at 1-800-FDA-1088. Where should I keep my medicine? This drug is given in a hospital or clinic and will not be stored at home. NOTE: This sheet is a summary. It may not cover all possible information. If you have questions about this medicine, talk to your doctor, pharmacist, or health care provider.  2020 Elsevier/Gold Standard (2013-09-29 14:19:39)  

## 2019-07-17 NOTE — Telephone Encounter (Signed)
Scheduled appts per 3/2 sch msg. Pt is aware of appt dates and times.

## 2019-07-18 ENCOUNTER — Encounter: Payer: Self-pay | Admitting: Hematology and Oncology

## 2019-07-18 ENCOUNTER — Other Ambulatory Visit: Payer: Self-pay | Admitting: Hematology and Oncology

## 2019-07-18 LAB — KAPPA/LAMBDA LIGHT CHAINS
Kappa free light chain: 93.8 mg/L — ABNORMAL HIGH (ref 3.3–19.4)
Kappa, lambda light chain ratio: 1.73 — ABNORMAL HIGH (ref 0.26–1.65)
Lambda free light chains: 54.2 mg/L — ABNORMAL HIGH (ref 5.7–26.3)

## 2019-07-18 NOTE — Assessment & Plan Note (Signed)
She tolerated pomalidomide well No recent infection Recent myeloma showed complete response.  She will continue treatment as directed She has appointment to return to [facility] for transplant follow-up She will continue aspirin for DVT prophylaxis She will continue calcium with vitamin D and Zometa every 3 months, due today She follows with a dentist on a regular basis with no recent dental issues There is no contraindication for her to proceed with Covid vaccine

## 2019-07-18 NOTE — Assessment & Plan Note (Signed)
She has mild peripheral neuropathy from prior treatment but well controlled with gabapentin She will continue the same

## 2019-07-18 NOTE — Telephone Encounter (Signed)
Pls refill electronically °

## 2019-07-18 NOTE — Progress Notes (Signed)
[place] OFFICE PROGRESS NOTE  Patient Care Team: Lucianne Lei, MD as PCP - General (Family Medicine) Jodi Marble, MD as Consulting Physician (Otolaryngology) Heath Lark, MD as Consulting Physician (Hematology and Oncology)  ASSESSMENT & PLAN:  Multiple myeloma in remission [facility]) She tolerated pomalidomide well No recent infection Recent myeloma showed complete response.  She will continue treatment as directed She has appointment to return to [facility] for transplant follow-up She will continue aspirin for DVT prophylaxis She will continue calcium with vitamin D and Zometa every 3 months, due today She follows with a dentist on a regular basis with no recent dental issues There is no contraindication for her to proceed with Covid vaccine  Peripheral neuropathy due to chemotherapy [facility]) She has mild peripheral neuropathy from prior treatment but well controlled with gabapentin She will continue the same   No orders of the defined types were placed in this encounter.   All questions were answered. The patient knows to call the clinic with any problems, questions or concerns. The total time spent in the appointment was 20 minutes encounter with patients including review of chart and various tests results, discussions about plan of care and coordination of care plan   Heath Lark, MD 07/18/2019 8:10 AM  INTERVAL HISTORY: Please see below for problem oriented charting. She returns for further follow-up She feels well No recent bone pain No infection, fever or chills She tolerated treatment well Neuropathy is stable  SUMMARY OF ONCOLOGIC HISTORY: Oncology History  Multiple myeloma in remission ([place])  03/22/2016 Bone Marrow Biopsy   Bone Marrow Biopsy: Plasma cells 17% by Aspirate and 30% by CD 138 stain. Plasma cell neoplasm, Kappa Restricted Normal cytogenetics, FISH positive for +11 and +14 and +7   04/13/2016 Imaging   Skeletal  survey showed lucencies within the calvarium worrisome for myeloma. No definite abnormal lytic or blastic lesions are observed elsewhere.   04/19/2016 - 09/10/2016 Chemotherapy   The patient consented to treatment with Revlimid, dexamethasone and Velcade   09/14/2016 Bone Marrow Biopsy   In summary, there is a normal cellular marrow with trilineage hematopoiesis.A mild plasmacytosis is present, but there is no definitive evidence of residual multiple myeloma   10/12/2016 - 10/12/2016 Chemotherapy   She received melphalan as conditioning chemo   10/13/2016 Bone Marrow Transplant   She received autologous stem cell transplant at [facility]   01/24/2017 Bone Marrow Biopsy   BONE MARROW: -Normocellular marrow for age (50%) with trilineage hematopoiesis. -No morphologic or immunohistochemical evidence of involvement by plasma cell neoplasm (see comment)  PERIPHERAL BLOOD: -Unremarkable (see CBC data)  COMMENT: The bone marrow is normocellular for age and shows adequate trilineage hematopoiesis without significant (<10%) dysplastic changes present. Blasts are not increased. Plasma cells represent about 2% of total cells on aspirate smears. Appropriately controlled immunohistochemical stains are performed on the core biopsy. CD138 highlights small interstitial plasma cells comprising approximately 2% of total cells. These plasma cells appear polytypic as demonstrated by kappa and lambda in-situ hybridization.   01/25/2017 PET scan   No FDG avid osseous lesions or masses are identified.   02/18/2017 - 04/01/2017 Chemotherapy   She received weekly Velcade, Pomalyst and Dexamethasone   04/15/2017 -  Chemotherapy   The patient had Pomalyst only   04/20/2017 Bone Marrow Biopsy   Bone Marrow (BM) and Peripheral Blood (PB) FINAL PATHOLOGIC DIAGNOSIS BONE MARROW: Normocellular bone marrow (40%) with normal numbers of megakaryocytes, erythroid hyperplasia and no increase in  plasma cells (1%).     REVIEW OF SYSTEMS:   Constitutional: Denies fevers, chills or abnormal weight loss Eyes: Denies blurriness of vision Ears, nose, mouth, throat, and face: Denies mucositis or sore throat Respiratory: Denies cough, dyspnea or wheezes Cardiovascular: Denies palpitation, chest discomfort or lower extremity swelling Gastrointestinal:  Denies nausea, heartburn or change in bowel habits Skin: Denies abnormal skin rashes Lymphatics: Denies new lymphadenopathy or easy bruising Neurological:Denies numbness, tingling or new weaknesses Behavioral/Psych: Mood is stable, no new changes  All other systems were reviewed with the patient and are negative.  I have reviewed the past medical history, past surgical history, social history and family history with the patient and they are unchanged from previous note.  ALLERGIES:  is allergic to revlimid [lenalidomide].  MEDICATIONS:  Current Outpatient Medications  Medication Sig Dispense Refill  . ASPIRIN 81 PO Take 81 mg by mouth daily.    . bisacodyl (DULCOLAX) 5 MG EC tablet Take 5 mg by mouth daily as needed for moderate constipation.    . Calcium Carbonate-Vitamin D (CALCIUM PLUS VITAMIN D PO) Take 1 tablet daily by mouth.    . calcium-vitamin D (OSCAL WITH D) 500-200 MG-UNIT tablet Take 2 tablets by mouth daily.    Marland Kitchen gabapentin (NEURONTIN) 300 MG capsule Take 2 capsules (600 mg total) by mouth 2 (two) times daily. 360 capsule 11  . losartan-hydrochlorothiazide (HYZAAR) 100-25 MG tablet Take 1 tablet by mouth daily. (Patient taking differently: Take 0.5 tablets by mouth daily. TAKES 1/2 TABLET DAILY) 90 tablet 3  . Multiple Vitamin (MULTIVITAMIN) tablet Take 1 tablet by mouth daily.    . naproxen sodium (ALEVE) 220 MG tablet Take 440 mg by mouth as needed (leg pain).    Marland Kitchen POMALYST 2 MG capsule TAKE 1 CAPSULE BY MOUTH ONCE DAILY FOR 21 DAYS ON AND 7 DAYS OFF 21 capsule 0   No current facility-administered medications for this  visit.    PHYSICAL EXAMINATION: ECOG PERFORMANCE STATUS: 0 - Asymptomatic  Vitals:   07/17/19 0958  BP: 131/74  Pulse: (!) 50  Resp: 18  Temp: 98.2 F (36.8 C)  SpO2: 100%   Filed Weights   07/17/19 0958  Weight: 280 lb 6.4 oz (127.2 kg)    GENERAL:alert, no distress and comfortable SKIN: skin color, texture, turgor are normal, no rashes or significant lesions EYES: normal, Conjunctiva are pink and non-injected, sclera clear OROPHARYNX:no exudate, no erythema and lips, buccal mucosa, and tongue normal  NECK: supple, thyroid normal size, non-tender, without nodularity LYMPH:  no palpable lymphadenopathy in the cervical, axillary or inguinal LUNGS: clear to auscultation and percussion with normal breathing effort HEART: regular rate & rhythm and no murmurs and no lower extremity edema ABDOMEN:abdomen soft, non-tender and normal bowel sounds Musculoskeletal:no cyanosis of digits and no clubbing  NEURO: alert & oriented x 3 with fluent speech, no focal motor/sensory deficits  LABORATORY DATA:  I have reviewed the data as listed    Component Value Date/Time   NA 139 07/17/2019 0850   NA 142 05/09/2017 0840   K 3.7 07/17/2019 0850   K 4.1 05/09/2017 0840   CL 103 07/17/2019 0850   CO2 28 07/17/2019 0850   CO2 28 05/09/2017 0840   GLUCOSE 77 07/17/2019 0850   GLUCOSE 61 (L) 05/09/2017 0840   BUN 20 07/17/2019 0850   BUN 23 07/14/2018 0000   BUN 14.4 05/09/2017 0840   CREATININE 1.02 (H) 07/17/2019 0850   CREATININE 0.9 05/09/2017 0840   CALCIUM  9.1 07/17/2019 0850   CALCIUM 9.3 05/09/2017 0840   PROT 7.7 07/17/2019 0850   PROT 6.1 05/09/2017 0840   PROT 6.3 (L) 05/09/2017 0840   ALBUMIN 3.4 (L) 07/17/2019 0850   ALBUMIN 3.4 (L) 05/09/2017 0840   AST 18 07/17/2019 0850   AST 20 05/09/2017 0840   ALT 11 07/17/2019 0850   ALT 21 05/09/2017 0840   ALKPHOS 78 07/17/2019 0850   ALKPHOS 81 05/09/2017 0840   BILITOT 0.9 07/17/2019 0850   BILITOT 0.74 05/09/2017 0840    GFRNONAA 57 (L) 07/17/2019 0850   GFRAA >60 07/17/2019 0850    No results found for: SPEP, UPEP  Lab Results  Component Value Date   WBC 4.5 07/17/2019   NEUTROABS 2.5 07/17/2019   HGB 12.2 07/17/2019   HCT 39.4 07/17/2019   MCV 91.0 07/17/2019   PLT 251 07/17/2019      Chemistry      Component Value Date/Time   NA 139 07/17/2019 0850   NA 142 05/09/2017 0840   K 3.7 07/17/2019 0850   K 4.1 05/09/2017 0840   CL 103 07/17/2019 0850   CO2 28 07/17/2019 0850   CO2 28 05/09/2017 0840   BUN 20 07/17/2019 0850   BUN 23 07/14/2018 0000   BUN 14.4 05/09/2017 0840   CREATININE 1.02 (H) 07/17/2019 0850   CREATININE 0.9 05/09/2017 0840      Component Value Date/Time   CALCIUM 9.1 07/17/2019 0850   CALCIUM 9.3 05/09/2017 0840   ALKPHOS 78 07/17/2019 0850   ALKPHOS 81 05/09/2017 0840   AST 18 07/17/2019 0850   AST 20 05/09/2017 0840   ALT 11 07/17/2019 0850   ALT 21 05/09/2017 0840   BILITOT 0.9 07/17/2019 0850   BILITOT 0.74 05/09/2017 0840

## 2019-07-20 ENCOUNTER — Telehealth: Payer: Self-pay

## 2019-07-20 LAB — MULTIPLE MYELOMA PANEL, SERUM
Albumin SerPl Elph-Mcnc: 3.4 g/dL (ref 2.9–4.4)
Albumin/Glob SerPl: 1.1 (ref 0.7–1.7)
Alpha 1: 0.2 g/dL (ref 0.0–0.4)
Alpha2 Glob SerPl Elph-Mcnc: 0.8 g/dL (ref 0.4–1.0)
B-Globulin SerPl Elph-Mcnc: 1 g/dL (ref 0.7–1.3)
Gamma Glob SerPl Elph-Mcnc: 1.4 g/dL (ref 0.4–1.8)
Globulin, Total: 3.4 g/dL (ref 2.2–3.9)
IgA: 301 mg/dL (ref 87–352)
IgG (Immunoglobin G), Serum: 1621 mg/dL — ABNORMAL HIGH (ref 586–1602)
IgM (Immunoglobulin M), Srm: 39 mg/dL (ref 26–217)
Total Protein ELP: 6.8 g/dL (ref 6.0–8.5)

## 2019-07-20 NOTE — Telephone Encounter (Signed)
-----   Message from Heath Lark, MD sent at 07/20/2019  3:52 PM EST ----- Regarding: labs Pls call her and let her know myeloma panel showed she is in remission The recent abnormal light chains could be from dehydration

## 2019-07-20 NOTE — Telephone Encounter (Signed)
Called and left below message. She verbalized understanding. 

## 2019-07-23 ENCOUNTER — Encounter: Payer: Self-pay | Admitting: Hematology and Oncology

## 2019-07-24 ENCOUNTER — Encounter: Payer: Self-pay | Admitting: Cardiovascular Disease

## 2019-07-24 ENCOUNTER — Telehealth (INDEPENDENT_AMBULATORY_CARE_PROVIDER_SITE_OTHER): Payer: Medicare PPO | Admitting: Cardiovascular Disease

## 2019-07-24 ENCOUNTER — Telehealth: Payer: Self-pay

## 2019-07-24 DIAGNOSIS — I712 Thoracic aortic aneurysm, without rupture, unspecified: Secondary | ICD-10-CM

## 2019-07-24 DIAGNOSIS — R0602 Shortness of breath: Secondary | ICD-10-CM | POA: Diagnosis not present

## 2019-07-24 DIAGNOSIS — E782 Mixed hyperlipidemia: Secondary | ICD-10-CM | POA: Diagnosis not present

## 2019-07-24 NOTE — Patient Instructions (Signed)
Medication Instructions:  Your physician recommends that you continue on your current medications as directed. Please refer to the Current Medication list given to you today.  If you need a refill on your cardiac medications before your next appointment, please call your pharmacy.   Lab work: BMET If you have labs (blood work) drawn today and your tests are completely normal, you will receive your results only by: [place] (if you have MyChart) OR A paper copy in the mail If you have any lab test that is abnormal or we need to change your treatment, we will call you to review the results.  Testing/Procedures: Chest CTA  AND  Coronary Calcium Score  Follow-Up: At [facility], you and your health needs are our priority.  As part of our continuing mission to provide you with exceptional heart care, we have created designated Provider Care Teams.  These Care Teams include your primary Cardiologist (physician) and Advanced Practice Providers (APPs -  Physician Assistants and Nurse Practitioners) who all work together to provide you with the care you need, when you need it. You may see Dr. Gwenlyn Found or one of the following Advanced Practice Providers on your designated Care Team:    Kerin Ransom, PA-C  [place], [place]  Coletta Memos, [place]  Your physician wants you to follow-up in: 1 year with Dr. Gwenlyn Found. You will receive a reminder letter in the mail two months in advance. If you don't receive a letter, please call our office to schedule the follow-up appointment.   Coronary Calcium Scan A coronary calcium scan is an imaging test used to look for deposits of plaque in the inner lining of the blood vessels of the heart (coronary arteries). Plaque is made up of calcium, protein, and fatty substances. These deposits of plaque can partly clog and narrow the coronary arteries without producing any symptoms or warning signs. This puts a person at risk for a heart attack. This test is  recommended for people who are at moderate risk for heart disease. The test can find plaque deposits before symptoms develop. Tell a health care provider about:  Any allergies you have.  All medicines you are taking, including vitamins, herbs, eye drops, creams, and over-the-counter medicines.  Any problems you or family members have had with anesthetic medicines.  Any blood disorders you have.  Any surgeries you have had.  Any medical conditions you have.  Whether you are pregnant or may be pregnant. What are the risks? Generally, this is a safe procedure. However, problems may occur, including:  Harm to a pregnant woman and her unborn baby. This test involves the use of radiation. Radiation exposure can be dangerous to a pregnant woman and her unborn baby. If you are pregnant or think you may be pregnant, you should not have this procedure done.  Slight increase in the risk of cancer. This is because of the radiation involved in the test. What happens before the procedure? Ask your health care provider for any specific instructions on how to prepare for this procedure. You may be asked to avoid products that contain caffeine, tobacco, or nicotine for 4 hours before the procedure. What happens during the procedure?   You will undress and remove any jewelry from your neck or chest.  You will put on a hospital gown.  Sticky electrodes will be placed on your chest. The electrodes will be connected to an electrocardiogram (ECG) machine to record a tracing of the electrical activity of your heart.  You will lie down on a curved bed that is attached to the [place].  You may be given medicine to slow down your heart rate so that clear pictures can be created.  You will be moved into the CT scanner, and the CT scanner will take pictures of your heart. During this time, you will be asked to lie still and hold your breath for 2-3 seconds at a time while each picture of your heart is  being taken. The procedure may vary among health care providers and hospitals. What happens after the procedure?  You can get dressed.  You can return to your normal activities.  It is up to you to get the results of your procedure. Ask your health care provider, or the department that is doing the procedure, when your results will be ready. Summary  A coronary calcium scan is an imaging test used to look for deposits of plaque in the inner lining of the blood vessels of the heart (coronary arteries). Plaque is made up of calcium, protein, and fatty substances.  Generally, this is a safe procedure. Tell your health care provider if you are pregnant or may be pregnant.  Ask your health care provider for any specific instructions on how to prepare for this procedure.  A CT scanner will take pictures of your heart.  You can return to your normal activities after the scan is done. This information is not intended to replace advice given to you by your health care provider. Make sure you discuss any questions you have with your health care provider. Document Revised: 11/21/2018 Document Reviewed: 11/21/2018 Elsevier Patient Education  [place].

## 2019-07-24 NOTE — Assessment & Plan Note (Signed)
History of hyperlipidemia not on statin therapy.  This will be followed by her PCP.  We will get a coronary calcium score to help guide therapy.

## 2019-07-24 NOTE — Addendum Note (Signed)
Addended by: Cain Sieve on: 07/24/2019 04:46 PM   Modules accepted: Orders

## 2019-07-24 NOTE — Assessment & Plan Note (Signed)
History of thoracic aortic aneurysm measuring 4 cm by chest CT 05/18/2018.  We will repeat a chest CTA to further measure

## 2019-07-24 NOTE — Telephone Encounter (Signed)
LVM for pt to call back regarding virtual telephone visit

## 2019-07-24 NOTE — Progress Notes (Signed)
07/24/2019 Carolyn Newman   05-Mar-1951  [id number]  Primary Physician Lucianne Lei, MD Primary Cardiologist: Lorretta Harp MD [place] Car[place], [place]  HPI:  Carolyn Newman is a 69 y.o.  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  She was referred by Dr. Alvy Bimler for cardiovascular evaluation because of a recently documented small thoracic aortic aneurysm on chest CT performed 05/18/2018.  I last saw her in the office 07/03/2018. Her risk factors include mild untreated hyperlipidemia.  She does not smoke.  She is not diabetic.  She is never had a heart attack or stroke.  There is no family history for heart disease.  She denies chest pain or shortness of breath.  She does have multiple myeloma diagnosed in 2017 and had a stem cell transplant and is now in remission.  Because of an upper respiratory tract infection and coughing she was seen in the emergency room on 05/18/2018 and had a chest CT that showed a small thoracic aortic aneurysm measuring 4 cm x 4 cm.  Since I saw her in the office she is remained stable.  She is apparently cancer free according to her oncologist.  She has not obtained a repeat chest CTA to measure her thoracic aortic aneurysm with which we will order.  She denies chest pain or shortness of breath.   Current Meds  Medication Sig  . ASPIRIN 81 PO Take 81 mg by mouth daily.  . bisacodyl (DULCOLAX) 5 MG EC tablet Take 5 mg by mouth daily as needed for moderate constipation.  . Calcium Carbonate-Vitamin D (CALCIUM PLUS VITAMIN D PO) Take 1 tablet daily by mouth.  . calcium-vitamin D (OSCAL WITH D) 500-200 MG-UNIT tablet Take 2 tablets by mouth daily.  Marland Kitchen gabapentin (NEURONTIN) 300 MG capsule Take 2 capsules (600 mg total) by mouth 2 (two) times daily.  Marland Kitchen losartan-hydrochlorothiazide (HYZAAR) 100-25 MG tablet Take 1 tablet by mouth daily. (Patient taking differently: Take 0.5  tablets by mouth daily. TAKES 1/2 TABLET DAILY)  . Multiple Vitamin (MULTIVITAMIN) tablet Take 1 tablet by mouth daily.  . naproxen sodium (ALEVE) 220 MG tablet Take 440 mg by mouth as needed (leg pain).  Marland Kitchen POMALYST 2 MG capsule TAKE 1 CAPSULE BY MOUTH ONCE DAILY FOR 21 DAYS ON AND 7 DAYS OFF     Allergies  Allergen Reactions  . Revlimid [Lenalidomide] Rash    Social History   Socioeconomic History  . Marital status: Single    Spouse name: Not on file  . Number of children: Not on file  . Years of education: Not on file  . Highest education level: Not on file  Occupational History  . Occupation: Optometrist  Tobacco Use  . Smoking status: Never Smoker  . Smokeless tobacco: Never Used  Substance and Sexual Activity  . Alcohol use: No  . Drug use: No  . Sexual activity: Not on file  Other Topics Concern  . Not on file  Social History Narrative   Right handed    2 cups of tea every other day    Lives at home by herself.    Social Determinants of Health   Financial Resource Strain:   . Difficulty of Paying Living Expenses: Not on file  Food Insecurity:   . Worried About Charity fundraiser in the Last Year: Not on file  . Ran Out of Food in the Last Year:  Not on file  Transportation Needs:   . Lack of Transportation (Medical): Not on file  . Lack of Transportation (Non-Medical): Not on file  Physical Activity:   . Days of Exercise per Week: Not on file  . Minutes of Exercise per Session: Not on file  Stress:   . Feeling of Stress : Not on file  Social Connections:   . Frequency of Communication with Friends and Family: Not on file  . Frequency of Social Gatherings with Friends and Family: Not on file  . Attends Religious Services: Not on file  . Active Member of Clubs or Organizations: Not on file  . Attends Archivist Meetings: Not on file  . Marital Status: Not on file  Intimate Partner Violence:   . Fear of Current or Ex-Partner: Not on file  .  Emotionally Abused: Not on file  . Physically Abused: Not on file  . Sexually Abused: Not on file     Review of Systems: General: negative for chills, fever, night sweats or weight changes.  Cardiovascular: negative for chest pain, dyspnea on exertion, edema, orthopnea, palpitations, paroxysmal nocturnal dyspnea or shortness of breath Dermatological: negative for rash Respiratory: negative for cough or wheezing Urologic: negative for hematuria Abdominal: negative for nausea, vomiting, diarrhea, bright red blood per rectum, melena, or hematemesis Neurologic: negative for visual changes, syncope, or dizziness All other systems reviewed and are otherwise negative except as noted above.    Blood pressure 113/81, pulse 69, height 5' 9"  (1.753 m), weight 275 lb (124.7 kg).  General appearance: alert and no distress Neck: no adenopathy, no carotid bruit, no JVD, supple, symmetrical, trachea midline and thyroid not enlarged, symmetric, no tenderness/mass/nodules Lungs: clear to auscultation bilaterally Heart: regular rate and rhythm, S1, S2 normal, no murmur, click, rub or gallop Skin: Skin color, texture, turgor normal. No rashes or lesions Neurologic: Alert and oriented X 3, normal strength and tone. Normal symmetric reflexes. Normal coordination and gait  EKG not performed today  ASSESSMENT AND PLAN:   Thoracic aortic aneurysm without rupture ([place]) History of thoracic aortic aneurysm measuring 4 cm by chest CT 05/18/2018.  We will repeat a chest CTA to further measure  Hyperlipidemia History of hyperlipidemia not on statin therapy.  This will be followed by her PCP.  We will get a coronary calcium score to help guide therapy.      Lorretta Harp MD FACP,FACC,FAHA, [facility] 07/24/2019 4:20 PM

## 2019-07-31 DIAGNOSIS — E782 Mixed hyperlipidemia: Secondary | ICD-10-CM | POA: Diagnosis not present

## 2019-07-31 DIAGNOSIS — E6609 Other obesity due to excess calories: Secondary | ICD-10-CM | POA: Diagnosis not present

## 2019-07-31 DIAGNOSIS — I10 Essential (primary) hypertension: Secondary | ICD-10-CM | POA: Diagnosis not present

## 2019-07-31 DIAGNOSIS — E785 Hyperlipidemia, unspecified: Secondary | ICD-10-CM | POA: Diagnosis not present

## 2019-07-31 DIAGNOSIS — R7309 Other abnormal glucose: Secondary | ICD-10-CM | POA: Diagnosis not present

## 2019-07-31 DIAGNOSIS — M130 Polyarthritis, unspecified: Secondary | ICD-10-CM | POA: Diagnosis not present

## 2019-08-08 DIAGNOSIS — R0602 Shortness of breath: Secondary | ICD-10-CM | POA: Diagnosis not present

## 2019-08-08 DIAGNOSIS — E782 Mixed hyperlipidemia: Secondary | ICD-10-CM | POA: Diagnosis not present

## 2019-08-08 DIAGNOSIS — I712 Thoracic aortic aneurysm, without rupture: Secondary | ICD-10-CM | POA: Diagnosis not present

## 2019-08-08 LAB — BASIC METABOLIC PANEL
BUN/Creatinine Ratio: 17 (ref 12–28)
BUN: 19 mg/dL (ref 8–27)
CO2: 26 mmol/L (ref 20–29)
Calcium: 9.4 mg/dL (ref 8.7–10.3)
Chloride: 99 mmol/L (ref 96–106)
Creatinine, Ser: 1.1 mg/dL — ABNORMAL HIGH (ref 0.57–1.00)
GFR calc Af Amer: 60 mL/min/{1.73_m2} (ref >59)
GFR calc non Af Amer: 52 mL/min/{1.73_m2} — ABNORMAL LOW (ref >59)
Glucose: 94 mg/dL (ref 65–99)
Potassium: 4.5 mmol/L (ref 3.5–5.2)
Sodium: 139 mmol/L (ref 134–144)

## 2019-08-14 ENCOUNTER — Ambulatory Visit
Admission: RE | Admit: 2019-08-14 | Discharge: 2019-08-14 | Disposition: A | Discharge status: Home / Self Care | Payer: No Typology Code available for payment source | Source: Ambulatory Visit | Attending: Cardiovascular Disease | Admitting: Cardiovascular Disease

## 2019-08-14 DIAGNOSIS — I712 Thoracic aortic aneurysm, without rupture, unspecified: Secondary | ICD-10-CM

## 2019-08-14 DIAGNOSIS — E782 Mixed hyperlipidemia: Secondary | ICD-10-CM

## 2019-08-14 DIAGNOSIS — Z8249 Family history of ischemic heart disease and other diseases of the circulatory system: Secondary | ICD-10-CM | POA: Diagnosis not present

## 2019-08-14 DIAGNOSIS — I700 Atherosclerosis of aorta: Secondary | ICD-10-CM | POA: Diagnosis not present

## 2019-08-14 DIAGNOSIS — I77810 Thoracic aortic ectasia: Secondary | ICD-10-CM | POA: Diagnosis not present

## 2019-08-14 DIAGNOSIS — R0602 Shortness of breath: Secondary | ICD-10-CM

## 2019-08-14 MED ORDER — IOPAMIDOL (ISOVUE-370) INJECTION 76%
75.0000 mL | Freq: Once | INTRAVENOUS | Status: AC | PRN
  Administered 2019-08-14: 75 mL via INTRAVENOUS

## 2019-08-15 ENCOUNTER — Other Ambulatory Visit: Payer: Self-pay | Admitting: Hematology and Oncology

## 2019-08-16 NOTE — Telephone Encounter (Signed)
Pls refill electronically °

## 2019-08-24 ENCOUNTER — Other Ambulatory Visit: Payer: Self-pay

## 2019-08-24 DIAGNOSIS — I712 Thoracic aortic aneurysm, without rupture, unspecified: Secondary | ICD-10-CM

## 2019-08-24 DIAGNOSIS — E782 Mixed hyperlipidemia: Secondary | ICD-10-CM

## 2019-08-24 NOTE — Progress Notes (Signed)
Lipid

## 2019-09-12 DIAGNOSIS — Z780 Asymptomatic menopausal state: Secondary | ICD-10-CM | POA: Diagnosis not present

## 2019-09-18 ENCOUNTER — Other Ambulatory Visit: Payer: Self-pay

## 2019-09-18 MED ORDER — POMALIDOMIDE 2 MG PO CAPS: ORAL_CAPSULE | ORAL | 0 refills | Status: DC

## 2019-10-12 DIAGNOSIS — I712 Thoracic aortic aneurysm, without rupture: Secondary | ICD-10-CM | POA: Diagnosis not present

## 2019-10-12 DIAGNOSIS — C9001 Multiple myeloma in remission: Secondary | ICD-10-CM | POA: Diagnosis not present

## 2019-10-12 DIAGNOSIS — R430 Anosmia: Secondary | ICD-10-CM | POA: Diagnosis not present

## 2019-10-12 DIAGNOSIS — K5903 Drug induced constipation: Secondary | ICD-10-CM | POA: Diagnosis not present

## 2019-10-12 DIAGNOSIS — T451X5A Adverse effect of antineoplastic and immunosuppressive drugs, initial encounter: Secondary | ICD-10-CM | POA: Diagnosis not present

## 2019-10-12 DIAGNOSIS — Z9484 Stem cells transplant status: Secondary | ICD-10-CM | POA: Diagnosis not present

## 2019-10-12 DIAGNOSIS — C9000 Multiple myeloma not having achieved remission: Secondary | ICD-10-CM | POA: Diagnosis not present

## 2019-10-12 DIAGNOSIS — G620 Drug-induced polyneuropathy: Secondary | ICD-10-CM | POA: Diagnosis not present

## 2019-10-18 ENCOUNTER — Encounter: Payer: Self-pay | Admitting: Hematology and Oncology

## 2019-10-18 ENCOUNTER — Other Ambulatory Visit: Payer: Self-pay

## 2019-10-18 ENCOUNTER — Inpatient Hospital Stay: Payer: Medicare PPO | Attending: Hematology and Oncology

## 2019-10-18 DIAGNOSIS — C9001 Multiple myeloma in remission: Secondary | ICD-10-CM | POA: Insufficient documentation

## 2019-10-18 DIAGNOSIS — C9000 Multiple myeloma not having achieved remission: Secondary | ICD-10-CM

## 2019-10-18 LAB — CBC WITH DIFFERENTIAL/PLATELET
Abs Immature Granulocytes: 0.04 10*3/uL (ref 0.00–0.07)
Basophils Absolute: 0.1 10*3/uL (ref 0.0–0.1)
Basophils Relative: 2 %
Eosinophils Absolute: 0.2 10*3/uL (ref 0.0–0.5)
Eosinophils Relative: 4 %
HCT: 36.8 % (ref 36.0–46.0)
Hemoglobin: 12 g/dL (ref 12.0–15.0)
Immature Granulocytes: 1 %
Lymphocytes Relative: 29 %
Lymphs Abs: 1.3 10*3/uL (ref 0.7–4.0)
MCH: 28.8 pg (ref 26.0–34.0)
MCHC: 32.6 g/dL (ref 30.0–36.0)
MCV: 88.2 fL (ref 80.0–100.0)
Monocytes Absolute: 0.5 10*3/uL (ref 0.1–1.0)
Monocytes Relative: 10 %
Neutro Abs: 2.6 10*3/uL (ref 1.7–7.7)
Neutrophils Relative %: 54 %
Platelets: 242 10*3/uL (ref 150–400)
RBC: 4.17 MIL/uL (ref 3.87–5.11)
RDW: 15 % (ref 11.5–15.5)
WBC: 4.7 10*3/uL (ref 4.0–10.5)
nRBC: 0 % (ref 0.0–0.2)

## 2019-10-18 LAB — COMPREHENSIVE METABOLIC PANEL
ALT: 12 U/L (ref 0–44)
AST: 18 U/L (ref 15–41)
Albumin: 3.2 g/dL — ABNORMAL LOW (ref 3.5–5.0)
Alkaline Phosphatase: 77 U/L (ref 38–126)
Anion gap: 9 (ref 5–15)
BUN: 22 mg/dL (ref 8–23)
CO2: 28 mmol/L (ref 22–32)
Calcium: 9.2 mg/dL (ref 8.9–10.3)
Chloride: 103 mmol/L (ref 98–111)
Creatinine, Ser: 1.03 mg/dL — ABNORMAL HIGH (ref 0.44–1.00)
GFR calc Af Amer: >60 mL/min (ref >60)
GFR calc non Af Amer: 56 mL/min — ABNORMAL LOW (ref >60)
Glucose, Bld: 96 mg/dL (ref 70–99)
Potassium: 3.9 mmol/L (ref 3.5–5.1)
Sodium: 140 mmol/L (ref 135–145)
Total Bilirubin: 0.7 mg/dL (ref 0.3–1.2)
Total Protein: 7 g/dL (ref 6.5–8.1)

## 2019-10-19 LAB — KAPPA/LAMBDA LIGHT CHAINS
Kappa free light chain: 63.6 mg/L — ABNORMAL HIGH (ref 3.3–19.4)
Kappa, lambda light chain ratio: 1.69 — ABNORMAL HIGH (ref 0.26–1.65)
Lambda free light chains: 37.7 mg/L — ABNORMAL HIGH (ref 5.7–26.3)

## 2019-10-22 ENCOUNTER — Other Ambulatory Visit: Payer: Self-pay | Admitting: Hematology and Oncology

## 2019-10-22 LAB — MULTIPLE MYELOMA PANEL, SERUM
Albumin SerPl Elph-Mcnc: 3.1 g/dL (ref 2.9–4.4)
Albumin/Glob SerPl: 1 (ref 0.7–1.7)
Alpha 1: 0.2 g/dL (ref 0.0–0.4)
Alpha2 Glob SerPl Elph-Mcnc: 0.8 g/dL (ref 0.4–1.0)
B-Globulin SerPl Elph-Mcnc: 1 g/dL (ref 0.7–1.3)
Gamma Glob SerPl Elph-Mcnc: 1.4 g/dL (ref 0.4–1.8)
Globulin, Total: 3.4 g/dL (ref 2.2–3.9)
IgA: 238 mg/dL (ref 87–352)
IgG (Immunoglobin G), Serum: 1367 mg/dL (ref 586–1602)
IgM (Immunoglobulin M), Srm: 35 mg/dL (ref 26–217)
Total Protein ELP: 6.5 g/dL (ref 6.0–8.5)

## 2019-10-22 NOTE — Telephone Encounter (Signed)
Pls refill electronically °

## 2019-10-24 ENCOUNTER — Telehealth: Payer: Self-pay | Admitting: Hematology and Oncology

## 2019-10-25 ENCOUNTER — Inpatient Hospital Stay (HOSPITAL_BASED_OUTPATIENT_CLINIC_OR_DEPARTMENT_OTHER): Payer: Medicare PPO | Admitting: Hematology and Oncology

## 2019-10-25 ENCOUNTER — Telehealth: Payer: Self-pay | Admitting: Hematology and Oncology

## 2019-10-25 ENCOUNTER — Encounter: Payer: Self-pay | Admitting: Hematology and Oncology

## 2019-10-25 DIAGNOSIS — C9001 Multiple myeloma in remission: Secondary | ICD-10-CM | POA: Diagnosis not present

## 2019-10-25 DIAGNOSIS — R7989 Other specified abnormal findings of blood chemistry: Secondary | ICD-10-CM

## 2019-10-25 NOTE — Progress Notes (Signed)
HEMATOLOGY-ONCOLOGY ELECTRONIC VISIT PROGRESS NOTE  Patient Care Team: Lucianne Lei, MD as PCP - General (Family Medicine) Jodi Marble, MD as Consulting Physician (Otolaryngology) Heath Lark, MD as Consulting Physician (Hematology and Oncology)  I connected with by [facility] video conference and verified that I am speaking with the correct person using two identifiers.  I discussed the limitations, risks, security and privacy concerns of performing an evaluation and management service by [facility] and the availability of in person appointments.  I also discussed with the patient that there may be a patient responsible charge related to this service. The patient expressed understanding and agreed to proceed.   ASSESSMENT & PLAN:  Multiple myeloma in remission ([place]) She tolerated pomalidomide well No recent infection Recent myeloma showed complete response.  She will continue treatment as directed She will continue aspirin for DVT prophylaxis Her recent bone density scan is satisfactory I recommend discontinuation of Zometa She will continue calcium with vitamin D  Obesity, Class III, BMI 40-49.9 (morbid obesity) ([place]) She is concerned about her weight We discussed dietary modification and weight loss strategies I recommend referral to weight management center and she is interested  Elevated serum creatinine She has intermittent elevated serum creatinine This could affect light chain results Her serum light chain ratio is normal Observe for now   Orders Placed This Encounter  Procedures  . Amb Ref to Medical Weight Management    Referral Priority:   Routine    Referral Type:   Consultation    Number of Visits Requested:   1    INTERVAL HISTORY: Please see below for problem oriented charting. Today's visit is about reviewing recent myeloma panel results She is doing well She tolerated Pomalyst well without side effects No recent infection, fever or chills No new bone pain No  recent dental issues  SUMMARY OF ONCOLOGIC HISTORY: Oncology History  Multiple myeloma in remission ([place])  03/22/2016 Bone Marrow Biopsy   Bone Marrow Biopsy: Plasma cells 17% by Aspirate and 30% by CD 138 stain. Plasma cell neoplasm, Kappa Restricted Normal cytogenetics, FISH positive for +11 and +14 and +7   04/13/2016 Imaging   Skeletal survey showed lucencies within the calvarium worrisome for myeloma. No definite abnormal lytic or blastic lesions are observed elsewhere.   04/19/2016 - 09/10/2016 Chemotherapy   The patient consented to treatment with Revlimid, dexamethasone and Velcade   09/14/2016 Bone Marrow Biopsy   In summary, there is a normal cellular marrow with trilineage hematopoiesis.A mild plasmacytosis is present, but there is no definitive evidence of residual multiple myeloma   10/12/2016 - 10/12/2016 Chemotherapy   She received melphalan as conditioning chemo   10/13/2016 Bone Marrow Transplant   She received autologous stem cell transplant at [facility]   01/24/2017 Bone Marrow Biopsy   BONE MARROW: -Normocellular marrow for age (50%) with trilineage hematopoiesis. -No morphologic or immunohistochemical evidence of involvement by plasma cell neoplasm (see comment)  PERIPHERAL BLOOD: -Unremarkable (see CBC data)  COMMENT: The bone marrow is normocellular for age and shows adequate trilineage hematopoiesis without significant (<10%) dysplastic changes present. Blasts are not increased. Plasma cells represent about 2% of total cells on aspirate smears. Appropriately controlled immunohistochemical stains are performed on the core biopsy. CD138 highlights small interstitial plasma cells comprising approximately 2% of total cells. These plasma cells appear polytypic as demonstrated by kappa and lambda in-situ hybridization.   01/25/2017 PET scan   No FDG avid osseous lesions or masses are identified.   02/18/2017 - 04/01/2017 Chemotherapy  She  received weekly Velcade, Pomalyst and Dexamethasone   04/15/2017 -  Chemotherapy   The patient had Pomalyst only   04/20/2017 Bone Marrow Biopsy   Bone Marrow (BM) and Peripheral Blood (PB) FINAL PATHOLOGIC DIAGNOSIS BONE MARROW: Normocellular bone marrow (40%) with normal numbers of megakaryocytes, erythroid hyperplasia and no increase in plasma cells (1%).     REVIEW OF SYSTEMS:   Constitutional: Denies fevers, chills or abnormal weight loss Eyes: Denies blurriness of vision Ears, nose, mouth, throat, and face: Denies mucositis or sore throat Respiratory: Denies cough, dyspnea or wheezes Cardiovascular: Denies palpitation, chest discomfort Gastrointestinal:  Denies nausea, heartburn or change in bowel habits Skin: Denies abnormal skin rashes Lymphatics: Denies new lymphadenopathy or easy bruising Neurological:Denies numbness, tingling or new weaknesses Behavioral/Psych: Mood is stable, no new changes  Extremities: No lower extremity edema All other systems were reviewed with the patient and are negative.  I have reviewed the past medical history, past surgical history, social history and family history with the patient and they are unchanged from previous note.  ALLERGIES:  is allergic to revlimid [lenalidomide].  MEDICATIONS:  Current Outpatient Medications  Medication Sig Dispense Refill  . ASPIRIN 81 PO Take 81 mg by mouth daily.    . bisacodyl (DULCOLAX) 5 MG EC tablet Take 5 mg by mouth daily as needed for moderate constipation.    . Calcium Carbonate-Vitamin D (CALCIUM PLUS VITAMIN D PO) Take 1 tablet daily by mouth.    . calcium-vitamin D (OSCAL WITH D) 500-200 MG-UNIT tablet Take 2 tablets by mouth daily.    Marland Kitchen gabapentin (NEURONTIN) 300 MG capsule Take 2 capsules (600 mg total) by mouth 2 (two) times daily. 360 capsule 11  . losartan-hydrochlorothiazide (HYZAAR) 100-25 MG tablet Take 1 tablet by mouth daily. (Patient taking differently: Take 0.5 tablets by mouth daily.  TAKES 1/2 TABLET DAILY) 90 tablet 3  . Multiple Vitamin (MULTIVITAMIN) tablet Take 1 tablet by mouth daily.    . naproxen sodium (ALEVE) 220 MG tablet Take 440 mg by mouth as needed (leg pain).    Marland Kitchen POMALYST 2 MG capsule TAKE 1 CAPSULE BY MOUTH ONCE DAILY FOR 21 DAYS ON AND 7 DAYS OFF 21 capsule 0   No current facility-administered medications for this visit.    PHYSICAL EXAMINATION: ECOG PERFORMANCE STATUS: 0 - Asymptomatic  LABORATORY DATA:  I have reviewed the data as listed CMP Latest Ref Rng & Units 10/18/2019 08/08/2019 07/17/2019  Glucose 70 - 99 mg/dL 96 94 77  BUN 8 - 23 mg/dL 22 19 20   Creatinine 0.44 - 1.00 mg/dL 1.03(H) 1.10(H) 1.02(H)  Sodium 135 - 145 mmol/L 140 139 139  Potassium 3.5 - 5.1 mmol/L 3.9 4.5 3.7  Chloride 98 - 111 mmol/L 103 99 103  CO2 22 - 32 mmol/L 28 26 28   Calcium 8.9 - 10.3 mg/dL 9.2 9.4 9.1  Total Protein 6.5 - 8.1 g/dL 7.0 - 7.7  Total Bilirubin 0.3 - 1.2 mg/dL 0.7 - 0.9  Alkaline Phos 38 - 126 U/L 77 - 78  AST 15 - 41 U/L 18 - 18  ALT 0 - 44 U/L 12 - 11    Lab Results  Component Value Date   WBC 4.7 10/18/2019   HGB 12.0 10/18/2019   HCT 36.8 10/18/2019   MCV 88.2 10/18/2019   PLT 242 10/18/2019   NEUTROABS 2.6 10/18/2019    I discussed the assessment and treatment plan with the patient. The patient was provided an opportunity to ask questions  and all were answered. The patient agreed with the plan and demonstrated an understanding of the instructions. The patient was advised to call back or seek an in-person evaluation if the symptoms worsen or if the condition fails to improve as anticipated.    I spent 20 minutes for the appointment reviewing test results, discuss management and coordination of care.  Heath Lark, MD 10/25/2019 12:03 PM

## 2019-10-25 NOTE — Assessment & Plan Note (Signed)
She tolerated pomalidomide well No recent infection Recent myeloma showed complete response.  She will continue treatment as directed She will continue aspirin for DVT prophylaxis Her recent bone density scan is satisfactory I recommend discontinuation of Zometa She will continue calcium with vitamin D

## 2019-10-25 NOTE — Telephone Encounter (Signed)
Scheduled apt per 6/10 sch message - unable to reach pt .left message with appt date and time.

## 2019-10-25 NOTE — Assessment & Plan Note (Signed)
She has intermittent elevated serum creatinine This could affect light chain results Her serum light chain ratio is normal Observe for now

## 2019-10-25 NOTE — Assessment & Plan Note (Signed)
She is concerned about her weight We discussed dietary modification and weight loss strategies I recommend referral to weight management center and she is interested

## 2019-11-19 ENCOUNTER — Other Ambulatory Visit: Payer: Self-pay | Admitting: Hematology and Oncology

## 2019-11-20 NOTE — Telephone Encounter (Signed)
Pls refill electronically °

## 2019-11-27 ENCOUNTER — Encounter (INDEPENDENT_AMBULATORY_CARE_PROVIDER_SITE_OTHER): Payer: Self-pay | Admitting: Family Medicine

## 2019-11-27 ENCOUNTER — Other Ambulatory Visit: Payer: Self-pay

## 2019-11-27 ENCOUNTER — Ambulatory Visit (INDEPENDENT_AMBULATORY_CARE_PROVIDER_SITE_OTHER): Payer: Medicare PPO | Admitting: Family Medicine

## 2019-11-27 VITALS — BP 100/64 | HR 47 | Temp 97.9°F | Ht 68.0 in | Wt 281.0 lb

## 2019-11-27 DIAGNOSIS — R0602 Shortness of breath: Secondary | ICD-10-CM

## 2019-11-27 DIAGNOSIS — Z6841 Body Mass Index (BMI) 40.0 and over, adult: Secondary | ICD-10-CM

## 2019-11-27 DIAGNOSIS — R430 Anosmia: Secondary | ICD-10-CM | POA: Diagnosis not present

## 2019-11-27 DIAGNOSIS — Z0289 Encounter for other administrative examinations: Secondary | ICD-10-CM

## 2019-11-27 DIAGNOSIS — C9001 Multiple myeloma in remission: Secondary | ICD-10-CM | POA: Diagnosis not present

## 2019-11-27 DIAGNOSIS — K5909 Other constipation: Secondary | ICD-10-CM | POA: Diagnosis not present

## 2019-11-27 DIAGNOSIS — R5383 Other fatigue: Secondary | ICD-10-CM

## 2019-11-27 DIAGNOSIS — F3289 Other specified depressive episodes: Secondary | ICD-10-CM | POA: Diagnosis not present

## 2019-11-27 DIAGNOSIS — I10 Essential (primary) hypertension: Secondary | ICD-10-CM | POA: Diagnosis not present

## 2019-11-27 DIAGNOSIS — E782 Mixed hyperlipidemia: Secondary | ICD-10-CM

## 2019-11-27 NOTE — Progress Notes (Signed)
Dear Dr. Alvy Bimler,   Thank you for referring Carolyn Newman to our clinic. The following note includes my evaluation and treatment recommendations.  Chief Complaint:   OBESITY Carolyn Newman (MR# [id number]) is a 69 y.o. female who presents for evaluation and treatment of obesity and related comorbidities. Current BMI is Body mass index is 42.73 kg/m. Carolyn Newman has been struggling with her weight for many years and has been unsuccessful in either losing weight, maintaining weight loss, or reaching her healthy weight goal.  Carolyn Newman is currently in the action stage of change and ready to dedicate time achieving and maintaining a healthier weight. Carolyn Newman is interested in becoming our patient and working on intensive lifestyle modifications including (but not limited to) diet and exercise for weight loss.  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  She is single and lives alone.  She says she did well previously with Weight Watchers, but got tired of points.  She has a history of phentermine use and says it affected her blood pressure (1990s).  Carolyn Newman habits were reviewed today and are as follows: her desired weight loss is 56 pounds, she has been heavy most of her life, she started gaining weight in 1990, her heaviest weight ever was 303 pounds, she crave sweets and salty snacks, crunchy foods, she snacks frequently in the evenings, she skips dinner frequently and she struggles with emotional eating.  Depression Screen Carolyn Newman's Food and Mood (modified PHQ-9) score was 6.  Depression screen PHQ 2/9 11/27/2019  Decreased Interest 1  Down, Depressed, Hopeless 0  PHQ - 2 Score 1  Altered sleeping 0  Tired, decreased energy 1  Change in appetite 1  Feeling bad or failure about yourself  1  Trouble concentrating 2  Moving slowly or fidgety/restless 0  Suicidal thoughts 0  PHQ-9 Score 6  Difficult doing work/chores Somewhat difficult   Subjective:    1. Other fatigue Carolyn Newman admits to daytime somnolence and denies waking up still tired. Patent has a history of symptoms of daytime fatigue. Carolyn Newman generally gets 6 or 7 hours of sleep per night, and states that she has generally restful sleep. Snoring is not present. Apneic episodes are not present. Epworth Sleepiness Score is 5.  2. SOB (shortness of breath) on exertion [name] notes increasing shortness of breath with exercising and seems to be worsening over time with weight gain. She notes getting out of breath sooner with activity than she used to. This has gotten worse recently. [name] denies shortness of breath at rest or orthopnea.  3. Multiple myeloma in remission Carolyn Newman) Carolyn Newman is currently taking Pomalyst 2 mg.  4. Essential hypertension Review: taking medications as instructed, no medication side effects noted, no chest pain on exertion, no dyspnea on exertion, no swelling of ankles.  She is taking losartain-HCTZ.  BP Readings from Last 3 Encounters:  11/27/19 100/64  07/24/19 113/81  07/17/19 131/74   5. Mixed hyperlipidemia Carolyn Newman has hyperlipidemia and has been trying to improve her cholesterol levels with intensive lifestyle modification including a low saturated fat diet, exercise and weight loss. She denies any chest pain, claudication or myalgias.    Lab Results  Component Value Date   ALT 12 10/18/2019   AST 18 10/18/2019   ALKPHOS 77 10/18/2019   BILITOT 0.7 10/18/2019   6. Other constipation Carolyn Newman notes constipation.   7. Anosmia Carolyn Newman prefers texture now (crunchy).  8. Other depression, with emotional eating Carolyn Newman is struggling with  emotional eating and using food for comfort to the extent that it is negatively impacting her health. She has been working on behavior modification techniques to help reduce her emotional eating and has been unsuccessful. She shows no sign of suicidal or homicidal ideations.  Carolyn Newman eats to stay awake, as a reward,  when she is bored, and for comfort.  Assessment/Plan:   1. Other fatigue Carolyn Newman does feel that her weight is causing her energy to be lower than it should be. Fatigue may be related to obesity, depression or many other causes. Labs will be ordered, and in the meanwhile, Carolyn Newman will focus on self care including making healthy food choices, increasing physical activity and focusing on stress reduction.  Orders - EKG 12-Lead - Anemia panel - Hemoglobin A1c - Insulin, random - VITAMIN D 25 Hydroxy (Vit-D Deficiency, Fractures)  2. SOB (shortness of breath) on exertion Carolyn Newman does feel that she gets out of breath more easily that she used to when she exercises. Carolyn Newman's shortness of breath appears to be obesity related and exercise induced. She has agreed to work on weight loss and gradually increase exercise to treat her exercise induced shortness of breath. Will continue to monitor closely.  3. Multiple myeloma in remission ([place]) Followed by Oncology for this problem. Those encounter notes were reviewed.   Orders - Hemoglobin A1c - Insulin, random  4. Essential hypertension Carolyn Newman is working on healthy weight loss and exercise to improve blood pressure control. We will watch for signs of hypotension as she continues her lifestyle modifications.  Orders - Anemia panel - CBC with Differential/Platelet - Comprehensive metabolic panel - Hemoglobin A1c - Insulin, random - T3 - T4, free - TSH  5. Mixed hyperlipidemia Cardiovascular risk and specific lipid/LDL goals reviewed.  We discussed several lifestyle modifications today and Carolyn Newman will continue to work on diet, exercise and weight loss efforts. Orders and follow up as documented in patient record.   Counseling Intensive lifestyle modifications are the first line treatment for this issue. . Dietary changes: Increase soluble fiber. Decrease simple carbohydrates. . Exercise changes: Moderate to vigorous-intensity aerobic  activity 150 minutes per week if tolerated. . Lipid-lowering medications: see documented in medical record.  Orders - Lipid Panel With LDL/HDL Ratio  6. Other constipation Carolyn Newman was informed that a decrease in bowel movement frequency is normal while losing weight, but stools should not be hard or painful. Orders and follow up as documented in patient record.   Counseling Getting to Good Bowel Health: Your goal is to have one soft bowel movement each day. Drink at least 8 glasses of water each day. Eat plenty of fiber (goal is over 25 grams each day). It is best to get most of your fiber from dietary sources which includes leafy green vegetables, fresh fruit, and whole grains. You may need to add fiber with the help of OTC fiber supplements. These include Metamucil, Citrucel, and Flaxseed. If you are still having trouble, try adding Miralax or Magnesium Citrate. 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  7. Anosmia Will continue to monitor.  8. Other depression, with emotional eating Behavior modification techniques were discussed today to help Carolyn Newman deal with her emotional/non-hunger eating behaviors.  Orders and follow up as documented in patient record.   9. Class 3 severe obesity with serious comorbidity and body mass index (BMI) of 40.0 to 44.9 in adult, unspecified obesity type [facility]) Carolyn Newman is currently in the action stage of change and her  goal is to continue with weight loss efforts. I recommend Carolyn Newman begin the structured treatment plan as follows:  She has agreed to the Category 2 Plan.  Exercise goals: No exercise has been prescribed at this time.   Behavioral modification strategies: increasing lean protein intake.  She was informed of the importance of frequent follow-up visits to maximize her success with intensive lifestyle modifications for her multiple health conditions. She was informed we would discuss her lab results at her next visit unless  there is a critical issue that needs to be addressed sooner. Carolyn Newman agreed to keep her next visit at the agreed upon time to discuss these results.  Objective:   Blood pressure 100/64, pulse (!) 47, temperature 97.9 F (36.6 C), temperature source Oral, height 5' 8" (1.727 m), weight 281 lb (127.5 kg), SpO2 100 %. Body mass index is 42.73 kg/m.  EKG: Normal sinus rhythm, rate 51 bpm.  Indirect Calorimeter completed today shows a VO2 of 265 and a REE of 1844.  Her calculated basal metabolic rate is 8921 thus her basal metabolic rate is worse than expected.  General: Cooperative, alert, well developed, in no acute distress. HEENT: Conjunctivae and lids unremarkable. Cardiovascular: Regular rhythm.  Lungs: Normal work of breathing. Neurologic: No focal deficits.   Lab Results  Component Value Date   CREATININE 1.03 (H) 10/18/2019   BUN 22 10/18/2019   NA 140 10/18/2019   K 3.9 10/18/2019   CL 103 10/18/2019   CO2 28 10/18/2019   Lab Results  Component Value Date   ALT 12 10/18/2019   AST 18 10/18/2019   ALKPHOS 77 10/18/2019   BILITOT 0.7 10/18/2019   Lab Results  Component Value Date   TSH 2.390 04/27/2018   Lab Results  Component Value Date   WBC 4.7 10/18/2019   HGB 12.0 10/18/2019   HCT 36.8 10/18/2019   MCV 88.2 10/18/2019   PLT 242 10/18/2019   Attestation Statements:   This is the patient's first visit at [facility]. The patient's NEW PATIENT PACKET was reviewed at length. Included in the packet: current and past health history, medications, allergies, ROS, gynecologic history (women only), surgical history, family history, social history, weight history, weight loss surgery history (for those that have had weight loss surgery), nutritional evaluation, mood and food questionnaire, PHQ9, Epworth questionnaire, sleep habits questionnaire, patient life and health improvement goals questionnaire. These will all be scanned into the patient's chart  under media.   During the visit, I independently reviewed the patient's EKG, bioimpedance scale results, and indirect calorimeter results. I used this information to tailor a meal plan for the patient that will help her to lose weight and will improve her obesity-related conditions going forward. I performed a medically necessary appropriate examination and/or evaluation. I discussed the assessment and treatment plan with the patient. The patient was provided an opportunity to ask questions and all were answered. The patient agreed with the plan and demonstrated an understanding of the instructions. Labs were ordered at this visit and will be reviewed at the next visit unless more critical results need to be addressed immediately. Clinical information was updated and documented in the EMR.   Time spent on visit including pre-visit chart review and post-visit charting and care was 63 minutes.   I, Water quality scientist, CMA, am acting as transcriptionist for Briscoe Deutscher, DO  I have reviewed the above documentation for accuracy and completeness, and I agree with the above. Briscoe Deutscher, DO

## 2019-11-28 LAB — COMPREHENSIVE METABOLIC PANEL
ALT: 12 IU/L (ref 0–32)
AST: 24 IU/L (ref 0–40)
Albumin/Globulin Ratio: 1.5 (ref 1.2–2.2)
Albumin: 4.1 g/dL (ref 3.8–4.8)
Alkaline Phosphatase: 84 IU/L (ref 48–121)
BUN/Creatinine Ratio: 19 (ref 12–28)
BUN: 18 mg/dL (ref 8–27)
Bilirubin Total: 0.9 mg/dL (ref 0.0–1.2)
CO2: 25 mmol/L (ref 20–29)
Calcium: 8.8 mg/dL (ref 8.7–10.3)
Chloride: 99 mmol/L (ref 96–106)
Creatinine, Ser: 0.97 mg/dL (ref 0.57–1.00)
GFR calc Af Amer: 70 mL/min/{1.73_m2} (ref >59)
GFR calc non Af Amer: 61 mL/min/{1.73_m2} (ref >59)
Globulin, Total: 2.7 g/dL (ref 1.5–4.5)
Glucose: 84 mg/dL (ref 65–99)
Potassium: 4.2 mmol/L (ref 3.5–5.2)
Sodium: 139 mmol/L (ref 134–144)
Total Protein: 6.8 g/dL (ref 6.0–8.5)

## 2019-11-28 LAB — CBC WITH DIFFERENTIAL/PLATELET
Basophils Absolute: 0.1 10*3/uL (ref 0.0–0.2)
Basos: 2 %
EOS (ABSOLUTE): 0.2 10*3/uL (ref 0.0–0.4)
Eos: 5 %
Hemoglobin: 12.4 g/dL (ref 11.1–15.9)
Immature Grans (Abs): 0 10*3/uL (ref 0.0–0.1)
Immature Granulocytes: 0 %
Lymphocytes Absolute: 1.4 10*3/uL (ref 0.7–3.1)
Lymphs: 35 %
MCH: 28.6 pg (ref 26.6–33.0)
MCHC: 31.5 g/dL (ref 31.5–35.7)
MCV: 91 fL (ref 79–97)
Monocytes Absolute: 0.5 10*3/uL (ref 0.1–0.9)
Monocytes: 12 %
Neutrophils Absolute: 1.9 10*3/uL (ref 1.4–7.0)
Neutrophils: 46 %
Platelets: 228 10*3/uL (ref 150–450)
RBC: 4.33 x10E6/uL (ref 3.77–5.28)
RDW: 14.7 % (ref 11.7–15.4)
WBC: 4.1 10*3/uL (ref 3.4–10.8)

## 2019-11-28 LAB — ANEMIA PANEL
Ferritin: 99 ng/mL (ref 15–150)
Folate, Hemolysate: 430 ng/mL
Folate, RBC: 1091 ng/mL (ref >498)
Hematocrit: 39.4 % (ref 34.0–46.6)
Iron Saturation: 43 % (ref 15–55)
Iron: 108 ug/dL (ref 27–139)
Retic Ct Pct: 1.4 % (ref 0.6–2.6)
Total Iron Binding Capacity: 250 ug/dL (ref 250–450)
UIBC: 142 ug/dL (ref 118–369)
Vitamin B-12: 498 pg/mL (ref 232–1245)

## 2019-11-28 LAB — VITAMIN D 25 HYDROXY (VIT D DEFICIENCY, FRACTURES): Vit D, 25-Hydroxy: 68.6 ng/mL (ref 30.0–100.0)

## 2019-11-28 LAB — HEMOGLOBIN A1C
Est. average glucose Bld gHb Est-mCnc: 114 mg/dL
Hgb A1c MFr Bld: 5.6 % (ref 4.8–5.6)

## 2019-11-28 LAB — LIPID PANEL WITH LDL/HDL RATIO
Cholesterol, Total: 215 mg/dL — ABNORMAL HIGH (ref 100–199)
HDL: 58 mg/dL
LDL Chol Calc (NIH): 145 mg/dL — ABNORMAL HIGH (ref 0–99)
LDL/HDL Ratio: 2.5 ratio (ref 0.0–3.2)
Triglycerides: 69 mg/dL (ref 0–149)
VLDL Cholesterol Cal: 12 mg/dL (ref 5–40)

## 2019-11-28 LAB — TSH: TSH: 2.13 u[IU]/mL (ref 0.450–4.500)

## 2019-11-28 LAB — INSULIN, RANDOM: INSULIN: 9.2 u[IU]/mL (ref 2.6–24.9)

## 2019-11-28 LAB — T3: T3, Total: 124 ng/dL (ref 71–180)

## 2019-11-28 LAB — T4, FREE: Free T4: 1.04 ng/dL (ref 0.82–1.77)

## 2019-12-05 DIAGNOSIS — M25511 Pain in right shoulder: Secondary | ICD-10-CM | POA: Diagnosis not present

## 2019-12-05 DIAGNOSIS — M1711 Unilateral primary osteoarthritis, right knee: Secondary | ICD-10-CM | POA: Diagnosis not present

## 2019-12-05 DIAGNOSIS — M170 Bilateral primary osteoarthritis of knee: Secondary | ICD-10-CM | POA: Diagnosis not present

## 2019-12-05 DIAGNOSIS — M25562 Pain in left knee: Secondary | ICD-10-CM | POA: Diagnosis not present

## 2019-12-05 DIAGNOSIS — M25461 Effusion, right knee: Secondary | ICD-10-CM | POA: Diagnosis not present

## 2019-12-05 DIAGNOSIS — M25512 Pain in left shoulder: Secondary | ICD-10-CM | POA: Diagnosis not present

## 2019-12-05 DIAGNOSIS — M25561 Pain in right knee: Secondary | ICD-10-CM | POA: Diagnosis not present

## 2019-12-06 DIAGNOSIS — M25562 Pain in left knee: Secondary | ICD-10-CM | POA: Diagnosis not present

## 2019-12-06 DIAGNOSIS — M1712 Unilateral primary osteoarthritis, left knee: Secondary | ICD-10-CM | POA: Diagnosis not present

## 2019-12-10 ENCOUNTER — Other Ambulatory Visit: Payer: Self-pay

## 2019-12-10 MED ORDER — POMALIDOMIDE 2 MG PO CAPS: ORAL_CAPSULE | ORAL | 0 refills | Status: DC

## 2019-12-11 ENCOUNTER — Ambulatory Visit (INDEPENDENT_AMBULATORY_CARE_PROVIDER_SITE_OTHER): Payer: Medicare Other | Admitting: Family Medicine

## 2019-12-13 ENCOUNTER — Ambulatory Visit (INDEPENDENT_AMBULATORY_CARE_PROVIDER_SITE_OTHER): Payer: Medicare PPO | Admitting: Family Medicine

## 2019-12-13 DIAGNOSIS — M25561 Pain in right knee: Secondary | ICD-10-CM | POA: Diagnosis not present

## 2019-12-13 DIAGNOSIS — M1711 Unilateral primary osteoarthritis, right knee: Secondary | ICD-10-CM | POA: Diagnosis not present

## 2019-12-19 DIAGNOSIS — M25562 Pain in left knee: Secondary | ICD-10-CM | POA: Diagnosis not present

## 2019-12-19 DIAGNOSIS — M1712 Unilateral primary osteoarthritis, left knee: Secondary | ICD-10-CM | POA: Diagnosis not present

## 2019-12-20 DIAGNOSIS — M1711 Unilateral primary osteoarthritis, right knee: Secondary | ICD-10-CM | POA: Diagnosis not present

## 2019-12-20 DIAGNOSIS — M25561 Pain in right knee: Secondary | ICD-10-CM | POA: Diagnosis not present

## 2019-12-25 ENCOUNTER — Encounter (INDEPENDENT_AMBULATORY_CARE_PROVIDER_SITE_OTHER): Payer: Self-pay | Admitting: Family Medicine

## 2019-12-25 ENCOUNTER — Other Ambulatory Visit: Payer: Self-pay

## 2019-12-25 ENCOUNTER — Ambulatory Visit (INDEPENDENT_AMBULATORY_CARE_PROVIDER_SITE_OTHER): Payer: Medicare PPO | Admitting: Family Medicine

## 2019-12-25 VITALS — BP 111/74 | HR 53 | Temp 97.8°F | Ht 68.0 in | Wt 278.0 lb

## 2019-12-25 DIAGNOSIS — E8881 Metabolic syndrome: Secondary | ICD-10-CM | POA: Diagnosis not present

## 2019-12-25 DIAGNOSIS — R430 Anosmia: Secondary | ICD-10-CM

## 2019-12-25 DIAGNOSIS — Z6841 Body Mass Index (BMI) 40.0 and over, adult: Secondary | ICD-10-CM | POA: Diagnosis not present

## 2019-12-26 NOTE — Progress Notes (Signed)
Chief Complaint:   OBESITY Caci is here to discuss her progress with her obesity treatment plan along with follow-up of her obesity related diagnoses. Dayjah is on the Category 2 Plan and states she is following her eating plan approximately 80% of the time. Nancee states she has increased her activity level.  Today's visit was #: 2 Starting weight: 281 lbs Starting date: 11/27/2019 Today's weight: 278 lbs Today's date: 12/25/2019 Total lbs lost to date: 3 lbs Total lbs lost since last in-office visit: 3 lbs  Interim History: Avalynn says that breakfast is going okay.  She has fruits/vegetables and water.  She snacks throughout the day (biggest struggle).  She endorses not getting enough protein.  Subjective:   1. Insulin resistance Erianna has a diagnosis of insulin resistance based on her elevated fasting insulin level >5.   Lab Results  Component Value Date   INSULIN 9.2 11/27/2019   Lab Results  Component Value Date   HGBA1C 5.6 11/27/2019   2. Anosmia Kaysi says this affects her food choices.    Assessment/Plan:   1. Insulin resistance Not optimized. Goal is HgbA1c < 5.7 and insulin level closer to 5. Cortney will continue to work on weight loss, exercise, and decreasing simple carbohydrates to help decrease the risk of diabetes. Aime agreed to follow-up with [place] as directed to closely monitor her progress.  2. Anosmia We discussed non-food related dopamine inducing activities.   3. Class 3 severe obesity with serious comorbidity and body mass index (BMI) of 40.0 to 44.9 in adult, unspecified obesity type [facility]) Daje is currently in the action stage of change. As such, her goal is to continue with weight loss efforts. She has agreed to the Category 2 Plan.   Exercise goals: For substantial health benefits, adults should do at least 150 minutes (2 hours and 30 minutes) a week of moderate-intensity, or 75 minutes (1 hour and 15 minutes) a week of  vigorous-intensity aerobic physical activity, or an equivalent combination of moderate- and vigorous-intensity aerobic activity. Aerobic activity should be performed in episodes of at least 10 minutes, and preferably, it should be spread throughout the week.  Behavioral modification strategies: increasing lean protein intake and better snacking choices.  Dafina has agreed to follow-up with our clinic in 2-3 weeks. She was informed of the importance of frequent follow-up visits to maximize her success with intensive lifestyle modifications for her multiple health conditions.   Objective:   Blood pressure 111/74, pulse (!) 53, temperature 97.8 F (36.6 C), temperature source Oral, height 5\' 8"  (1.727 m), weight 278 lb (126.1 kg), SpO2 100 %. Body mass index is 42.27 kg/m.  General: Cooperative, alert, well developed, in no acute distress. HEENT: Conjunctivae and lids unremarkable. Cardiovascular: Regular rhythm.  Lungs: Normal work of breathing. Neurologic: No focal deficits.   Lab Results  Component Value Date   CREATININE 0.97 11/27/2019   BUN 18 11/27/2019   NA 139 11/27/2019   K 4.2 11/27/2019   CL 99 11/27/2019   CO2 25 11/27/2019   Lab Results  Component Value Date   ALT 12 11/27/2019   AST 24 11/27/2019   ALKPHOS 84 11/27/2019   BILITOT 0.9 11/27/2019   Lab Results  Component Value Date   HGBA1C 5.6 11/27/2019   Lab Results  Component Value Date   INSULIN 9.2 11/27/2019   Lab Results  Component Value Date   TSH 2.130 11/27/2019   Lab Results  Component Value Date   CHOL  215 (H) 11/27/2019   HDL 58 11/27/2019   LDLCALC 145 (H) 11/27/2019   TRIG 69 11/27/2019   Lab Results  Component Value Date   WBC 4.1 11/27/2019   HGB 12.4 11/27/2019   HCT 39.4 11/27/2019   MCV 91 11/27/2019   PLT 228 11/27/2019   Lab Results  Component Value Date   IRON 108 11/27/2019   TIBC 250 11/27/2019   FERRITIN 99 11/27/2019   Attestation Statements:   Reviewed by  clinician on day of visit: allergies, medications, problem list, medical history, surgical history, family history, social history, and previous encounter notes.  Time spent on visit including pre-visit chart review and post-visit care and charting was 25 minutes.   I, Water quality scientist, CMA, am acting as transcriptionist for Briscoe Deutscher, DO  I have reviewed the above documentation for accuracy and completeness, and I agree with the above. Briscoe Deutscher, DO

## 2020-01-02 DIAGNOSIS — M25562 Pain in left knee: Secondary | ICD-10-CM | POA: Diagnosis not present

## 2020-01-02 DIAGNOSIS — M1712 Unilateral primary osteoarthritis, left knee: Secondary | ICD-10-CM | POA: Diagnosis not present

## 2020-01-03 DIAGNOSIS — M1711 Unilateral primary osteoarthritis, right knee: Secondary | ICD-10-CM | POA: Diagnosis not present

## 2020-01-03 DIAGNOSIS — M25561 Pain in right knee: Secondary | ICD-10-CM | POA: Diagnosis not present

## 2020-01-10 ENCOUNTER — Other Ambulatory Visit: Payer: Self-pay | Admitting: Hematology and Oncology

## 2020-01-10 DIAGNOSIS — M25562 Pain in left knee: Secondary | ICD-10-CM | POA: Diagnosis not present

## 2020-01-10 DIAGNOSIS — M1712 Unilateral primary osteoarthritis, left knee: Secondary | ICD-10-CM | POA: Diagnosis not present

## 2020-01-10 NOTE — Telephone Encounter (Signed)
Pls refill electronically °

## 2020-01-18 ENCOUNTER — Inpatient Hospital Stay: Payer: Medicare PPO | Attending: Hematology and Oncology

## 2020-01-18 ENCOUNTER — Other Ambulatory Visit: Payer: Self-pay

## 2020-01-18 DIAGNOSIS — R7989 Other specified abnormal findings of blood chemistry: Secondary | ICD-10-CM | POA: Insufficient documentation

## 2020-01-18 DIAGNOSIS — C9000 Multiple myeloma not having achieved remission: Secondary | ICD-10-CM

## 2020-01-18 DIAGNOSIS — C9001 Multiple myeloma in remission: Secondary | ICD-10-CM | POA: Insufficient documentation

## 2020-01-18 DIAGNOSIS — Z79899 Other long term (current) drug therapy: Secondary | ICD-10-CM | POA: Insufficient documentation

## 2020-01-18 LAB — COMPREHENSIVE METABOLIC PANEL
ALT: 8 U/L (ref 0–44)
AST: 16 U/L (ref 15–41)
Albumin: 3.4 g/dL — ABNORMAL LOW (ref 3.5–5.0)
Alkaline Phosphatase: 86 U/L (ref 38–126)
Anion gap: 8 (ref 5–15)
BUN: 18 mg/dL (ref 8–23)
CO2: 30 mmol/L (ref 22–32)
Calcium: 9.3 mg/dL (ref 8.9–10.3)
Chloride: 102 mmol/L (ref 98–111)
Creatinine, Ser: 1.18 mg/dL — ABNORMAL HIGH (ref 0.44–1.00)
GFR calc Af Amer: 55 mL/min — ABNORMAL LOW (ref >60)
GFR calc non Af Amer: 47 mL/min — ABNORMAL LOW (ref >60)
Glucose, Bld: 80 mg/dL (ref 70–99)
Potassium: 4 mmol/L (ref 3.5–5.1)
Sodium: 140 mmol/L (ref 135–145)
Total Bilirubin: 0.9 mg/dL (ref 0.3–1.2)
Total Protein: 7.2 g/dL (ref 6.5–8.1)

## 2020-01-18 LAB — CBC WITH DIFFERENTIAL/PLATELET
Abs Immature Granulocytes: 0.01 10*3/uL (ref 0.00–0.07)
Basophils Absolute: 0.1 10*3/uL (ref 0.0–0.1)
Basophils Relative: 3 %
Eosinophils Absolute: 0.3 10*3/uL (ref 0.0–0.5)
Eosinophils Relative: 8 %
HCT: 38 % (ref 36.0–46.0)
Hemoglobin: 12.2 g/dL (ref 12.0–15.0)
Immature Granulocytes: 0 %
Lymphocytes Relative: 39 %
Lymphs Abs: 1.6 10*3/uL (ref 0.7–4.0)
MCH: 28.5 pg (ref 26.0–34.0)
MCHC: 32.1 g/dL (ref 30.0–36.0)
MCV: 88.8 fL (ref 80.0–100.0)
Monocytes Absolute: 0.5 10*3/uL (ref 0.1–1.0)
Monocytes Relative: 14 %
Neutro Abs: 1.5 10*3/uL — ABNORMAL LOW (ref 1.7–7.7)
Neutrophils Relative %: 36 %
Platelets: 181 10*3/uL (ref 150–400)
RBC: 4.28 MIL/uL (ref 3.87–5.11)
RDW: 15.6 % — ABNORMAL HIGH (ref 11.5–15.5)
WBC: 4 10*3/uL (ref 4.0–10.5)
nRBC: 0 % (ref 0.0–0.2)

## 2020-01-22 ENCOUNTER — Encounter (INDEPENDENT_AMBULATORY_CARE_PROVIDER_SITE_OTHER): Payer: Self-pay | Admitting: Family Medicine

## 2020-01-22 ENCOUNTER — Ambulatory Visit (INDEPENDENT_AMBULATORY_CARE_PROVIDER_SITE_OTHER): Payer: Medicare PPO | Admitting: Family Medicine

## 2020-01-22 ENCOUNTER — Other Ambulatory Visit: Payer: Self-pay

## 2020-01-22 VITALS — BP 117/77 | HR 55 | Temp 97.7°F | Ht 68.0 in | Wt 276.0 lb

## 2020-01-22 DIAGNOSIS — E65 Localized adiposity: Secondary | ICD-10-CM | POA: Diagnosis not present

## 2020-01-22 DIAGNOSIS — E8881 Metabolic syndrome: Secondary | ICD-10-CM | POA: Diagnosis not present

## 2020-01-22 DIAGNOSIS — Z6841 Body Mass Index (BMI) 40.0 and over, adult: Secondary | ICD-10-CM

## 2020-01-22 DIAGNOSIS — E66813 Obesity, class 3: Secondary | ICD-10-CM

## 2020-01-22 DIAGNOSIS — E88819 Insulin resistance, unspecified: Secondary | ICD-10-CM

## 2020-01-22 LAB — KAPPA/LAMBDA LIGHT CHAINS
Kappa free light chain: 54.5 mg/L — ABNORMAL HIGH (ref 3.3–19.4)
Kappa, lambda light chain ratio: 1.78 — ABNORMAL HIGH (ref 0.26–1.65)
Lambda free light chains: 30.7 mg/L — ABNORMAL HIGH (ref 5.7–26.3)

## 2020-01-23 LAB — MULTIPLE MYELOMA PANEL, SERUM
Albumin SerPl Elph-Mcnc: 3.4 g/dL (ref 2.9–4.4)
Albumin/Glob SerPl: 1.1 (ref 0.7–1.7)
Alpha 1: 0.2 g/dL (ref 0.0–0.4)
Alpha2 Glob SerPl Elph-Mcnc: 0.8 g/dL (ref 0.4–1.0)
B-Globulin SerPl Elph-Mcnc: 1 g/dL (ref 0.7–1.3)
Gamma Glob SerPl Elph-Mcnc: 1.3 g/dL (ref 0.4–1.8)
Globulin, Total: 3.4 g/dL (ref 2.2–3.9)
IgA: 241 mg/dL (ref 87–352)
IgG (Immunoglobin G), Serum: 1204 mg/dL (ref 586–1602)
IgM (Immunoglobulin M), Srm: 29 mg/dL (ref 26–217)
Total Protein ELP: 6.8 g/dL (ref 6.0–8.5)

## 2020-01-23 NOTE — Progress Notes (Signed)
Chief Complaint:   OBESITY Carolyn Newman is here to discuss her progress with her obesity treatment plan along with follow-up of her obesity related diagnoses. Carolyn Newman is on the Category 2 Plan and states she is following her eating plan approximately 70% of the time. Carolyn Newman states she is doing cardio and strength training for 30 minutes 2 times per week.  Today's visit was #: 3 Starting weight: 281 lbs Starting date: 11/27/2019 Today's weight: 276 lbs Today's date: 01/22/2020 Total lbs lost to date: 5 lbs Total lbs lost since last in-office visit: 2 lbs Total weight loss percentage to date: -1.78%  Interim History: Carolyn Newman says she is eating breakfast each day.  She has been busy at work.  She endorses being overly hungry at dinner.  She says she is not satisfied, even after eating. She has an upcoming appointment with Oncology on Friday. We reviewed Wellbutrin and semaglutide.  Assessment/Plan:   1. Metabolic syndrome Goal: Lose 7-10% of starting weight. Counseling: Intensive lifestyle modifications are the first line treatment for this issue. We discussed several lifestyle modifications today and she will continue to work on diet, exercise and weight loss efforts.   2. Visceral obesity Visceral adipose tissue is a hormonally active component of total body fat. This body composition phenotype is associated with medical disorders such as metabolic syndrome, cardiovascular disease and several malignancies including prostate, breast, and colorectal cancers. Goal: Lose 7-10% of starting weight. Visceral fat rating should be < 13.  3. Insulin resistance Carolyn Newman has a diagnosis of insulin resistance based on her elevated fasting insulin level >5. She continues to work on diet and exercise to decrease her risk of diabetes.    Lab Results  Component Value Date   INSULIN 9.2 11/27/2019   Lab Results  Component Value Date   HGBA1C 5.6 11/27/2019   4. Class 3 severe obesity with serious  comorbidity and body mass index (BMI) of 40.0 to 44.9 in adult, unspecified obesity type Carolyn Newman) Carolyn Newman is currently in the action stage of change. As such, her goal is to continue with weight loss efforts. She has agreed to the Category 2 Plan.   Exercise goals: For substantial health benefits, adults should do at least 150 minutes (2 hours and 30 minutes) a week of moderate-intensity, or 75 minutes (1 hour and 15 minutes) a week of vigorous-intensity aerobic physical activity, or an equivalent combination of moderate- and vigorous-intensity aerobic activity. Aerobic activity should be performed in episodes of at least 10 minutes, and preferably, it should be spread throughout the week.  Behavioral modification strategies: increasing lean protein intake (aim for 20-30 grams with each meal).  Carolyn Newman has agreed to follow-up with our clinic in 3 weeks. She was informed of the importance of frequent follow-up visits to maximize her success with intensive lifestyle modifications for her multiple health conditions.   Objective:   Blood pressure 117/77, pulse (!) 55, temperature 97.7 F (36.5 C), temperature source Oral, height 5\' 8"  (1.727 m), weight 276 lb (125.2 kg), SpO2 100 %. Body mass index is 41.97 kg/m.  General: Cooperative, alert, well developed, in no acute distress. HEENT: Conjunctivae and lids unremarkable. Cardiovascular: Regular rhythm.  Lungs: Normal work of breathing. Neurologic: No focal deficits.   Lab Results  Component Value Date   CREATININE 1.18 (H) 01/18/2020   BUN 18 01/18/2020   NA 140 01/18/2020   K 4.0 01/18/2020   CL 102 01/18/2020   CO2 30 01/18/2020   Lab Results  Component  Value Date   ALT 8 01/18/2020   AST 16 01/18/2020   ALKPHOS 86 01/18/2020   BILITOT 0.9 01/18/2020   Lab Results  Component Value Date   HGBA1C 5.6 11/27/2019   Lab Results  Component Value Date   INSULIN 9.2 11/27/2019   Lab Results  Component Value Date   TSH 2.130  11/27/2019   Lab Results  Component Value Date   CHOL 215 (H) 11/27/2019   HDL 58 11/27/2019   LDLCALC 145 (H) 11/27/2019   TRIG 69 11/27/2019   Lab Results  Component Value Date   WBC 4.0 01/18/2020   HGB 12.2 01/18/2020   HCT 38.0 01/18/2020   MCV 88.8 01/18/2020   PLT 181 01/18/2020   Lab Results  Component Value Date   IRON 108 11/27/2019   TIBC 250 11/27/2019   FERRITIN 99 11/27/2019   Obesity Behavioral Intervention:   Approximately 15 minutes were spent on the discussion below.  ASK: We discussed the diagnosis of obesity with Carolyn Newman today and Carolyn Newman agreed to give [place] permission to discuss obesity behavioral modification therapy today.  ASSESS: Carolyn Newman has the diagnosis of obesity and her BMI today is 42.1. Carolyn Newman is in the action stage of change.   ADVISE: Carolyn Newman was educated on the multiple health risks of obesity as well as the benefit of weight loss to improve her health. She was advised of the need for long term treatment and the importance of lifestyle modifications to improve her current health and to decrease her risk of future health problems.  AGREE: Multiple dietary modification options and treatment options were discussed and Carolyn Newman agreed to follow the recommendations documented in the above note.  ARRANGE: Carolyn Newman was educated on the importance of frequent visits to treat obesity as outlined per CMS and USPSTF guidelines and agreed to schedule her next follow up appointment today.  Attestation Statements:   Reviewed by clinician on day of visit: allergies, medications, problem list, medical history, surgical history, family history, social history, and previous encounter notes.  I, Water quality scientist, CMA, am acting as transcriptionist for Briscoe Deutscher, DO  I have reviewed the above documentation for accuracy and completeness, and I agree with the above. Briscoe Deutscher, DO

## 2020-01-24 ENCOUNTER — Encounter: Payer: Self-pay | Admitting: Hematology and Oncology

## 2020-01-25 ENCOUNTER — Telehealth: Payer: Self-pay | Admitting: Hematology and Oncology

## 2020-01-25 ENCOUNTER — Inpatient Hospital Stay (HOSPITAL_BASED_OUTPATIENT_CLINIC_OR_DEPARTMENT_OTHER): Payer: Medicare PPO | Admitting: Hematology and Oncology

## 2020-01-25 ENCOUNTER — Encounter: Payer: Self-pay | Admitting: Hematology and Oncology

## 2020-01-25 DIAGNOSIS — Z299 Encounter for prophylactic measures, unspecified: Secondary | ICD-10-CM

## 2020-01-25 DIAGNOSIS — C9001 Multiple myeloma in remission: Secondary | ICD-10-CM | POA: Diagnosis not present

## 2020-01-25 DIAGNOSIS — R7989 Other specified abnormal findings of blood chemistry: Secondary | ICD-10-CM | POA: Diagnosis not present

## 2020-01-25 NOTE — Assessment & Plan Note (Signed)
She tolerated pomalidomide well No recent infection Recent myeloma showed complete response.  She will continue treatment as directed The mildly elevated serum light chain is likely related to elevated serum creatinine She will continue aspirin for DVT prophylaxis Her recent bone density scan is satisfactory I recommend discontinuation of Zometa from previous visit She will continue calcium with vitamin D

## 2020-01-25 NOTE — Assessment & Plan Note (Signed)
We discussed the importance of preventive care and reviewed the vaccination programs. She does not have any prior allergic reactions to Covid-19 vaccination. She agrees to proceed with booster dose of Covid-19 vaccination and she will get it from local drug store.  We also discussed the importance of influenza vaccination

## 2020-01-25 NOTE — Progress Notes (Signed)
HEMATOLOGY-ONCOLOGY ELECTRONIC VISIT PROGRESS NOTE  Patient Care Team: Lucianne Lei, MD as PCP - General (Family Medicine) Jodi Marble, MD as Consulting Physician (Otolaryngology) Heath Lark, MD as Consulting Physician (Hematology and Oncology)  I connected with by [facility] video conference and verified that I am speaking with the correct person using two identifiers.  I discussed the limitations, risks, security and privacy concerns of performing an evaluation and management service by [facility] and the availability of in person appointments.  I also discussed with the patient that there may be a patient responsible charge related to this service. The patient expressed understanding and agreed to proceed.   ASSESSMENT & PLAN:  Multiple myeloma in remission ([place]) She tolerated pomalidomide well No recent infection Recent myeloma showed complete response.  She will continue treatment as directed The mildly elevated serum light chain is likely related to elevated serum creatinine She will continue aspirin for DVT prophylaxis Her recent bone density scan is satisfactory I recommend discontinuation of Zometa from previous visit She will continue calcium with vitamin D  Elevated serum creatinine She has intermittent elevated serum creatinine This could affect light chain results Her serum light chain ratio is near normal Observe for now  Obesity, Class III, BMI 40-49.9 (morbid obesity) ([place]) I encouraged the patient to continue to work with weight management clinic I congratulated her effort and her 5 pounds weight loss  Preventive measure We discussed the importance of preventive care and reviewed the vaccination programs. She does not have any prior allergic reactions to Covid-19 vaccination. She agrees to proceed with booster dose of Covid-19 vaccination and she will get it from local drug store.  We also discussed the importance of influenza vaccination    No orders of the defined  types were placed in this encounter.   INTERVAL HISTORY: Please see below for problem oriented charting. She returns for discussion about myeloma management She is doing well She has lost 5 pounds through weight management center She is monitoring her oral intake and started exercising No recent infection, fever or chills No side effects from chemotherapy so far  SUMMARY OF ONCOLOGIC HISTORY: Oncology History  Multiple myeloma in remission ([place])  03/22/2016 Bone Marrow Biopsy   Bone Marrow Biopsy: Plasma cells 17% by Aspirate and 30% by CD 138 stain. Plasma cell neoplasm, Kappa Restricted Normal cytogenetics, FISH positive for +11 and +14 and +7   04/13/2016 Imaging   Skeletal survey showed lucencies within the calvarium worrisome for myeloma. No definite abnormal lytic or blastic lesions are observed elsewhere.   04/19/2016 - 09/10/2016 Chemotherapy   The patient consented to treatment with Revlimid, dexamethasone and Velcade   09/14/2016 Bone Marrow Biopsy   In summary, there is a normal cellular marrow with trilineage hematopoiesis.A mild plasmacytosis is present, but there is no definitive evidence of residual multiple myeloma   10/12/2016 - 10/12/2016 Chemotherapy   She received melphalan as conditioning chemo   10/13/2016 Bone Marrow Transplant   She received autologous stem cell transplant at [facility]   01/24/2017 Bone Marrow Biopsy   BONE MARROW: -Normocellular marrow for age (50%) with trilineage hematopoiesis. -No morphologic or immunohistochemical evidence of involvement by plasma cell neoplasm (see comment)  PERIPHERAL BLOOD: -Unremarkable (see CBC data)  COMMENT: The bone marrow is normocellular for age and shows adequate trilineage hematopoiesis without significant (<10%) dysplastic changes present. Blasts are not increased. Plasma cells represent about 2% of total cells on aspirate smears. Appropriately controlled immunohistochemical  stains are performed on the core  biopsy. CD138 highlights small interstitial plasma cells comprising approximately 2% of total cells. These plasma cells appear polytypic as demonstrated by kappa and lambda in-situ hybridization.   01/25/2017 PET scan   No FDG avid osseous lesions or masses are identified.   02/18/2017 - 04/01/2017 Chemotherapy   She received weekly Velcade, Pomalyst and Dexamethasone   04/15/2017 -  Chemotherapy   The patient had Pomalyst only   04/20/2017 Bone Marrow Biopsy   Bone Marrow (BM) and Peripheral Blood (PB) FINAL PATHOLOGIC DIAGNOSIS BONE MARROW: Normocellular bone marrow (40%) with normal numbers of megakaryocytes, erythroid hyperplasia and no increase in plasma cells (1%).     REVIEW OF SYSTEMS:   Constitutional: Denies fevers, chills or abnormal weight loss Eyes: Denies blurriness of vision Ears, nose, mouth, throat, and face: Denies mucositis or sore throat Respiratory: Denies cough, dyspnea or wheezes Cardiovascular: Denies palpitation, chest discomfort Gastrointestinal:  Denies nausea, heartburn or change in bowel habits Skin: Denies abnormal skin rashes Lymphatics: Denies new lymphadenopathy or easy bruising Neurological:Denies numbness, tingling or new weaknesses Behavioral/Psych: Mood is stable, no new changes  Extremities: No lower extremity edema All other systems were reviewed with the patient and are negative.  I have reviewed the past medical history, past surgical history, social history and family history with the patient and they are unchanged from previous note.  ALLERGIES:  is allergic to revlimid [lenalidomide].  MEDICATIONS:  Current Outpatient Medications  Medication Sig Dispense Refill  . acetaminophen (TYLENOL) 500 MG tablet Take 500 mg by mouth every 6 (six) hours as needed.    . ASPIRIN 81 PO Take 81 mg by mouth daily.    . Calcium Carbonate-Vitamin D (CALCIUM PLUS VITAMIN D PO) Take 1 tablet daily by mouth.    .  calcium-vitamin D (OSCAL WITH D) 500-200 MG-UNIT tablet Take 2 tablets by mouth daily.    . Chlorphen-PE-Acetaminophen (NOREL AD PO) Take 325 mg by mouth 4 (four) times daily as needed.    . fluticasone (FLONASE) 50 MCG/ACT nasal spray Place into both nostrils daily as needed for allergies or rhinitis.    Marland Kitchen gabapentin (NEURONTIN) 300 MG capsule Take 2 capsules (600 mg total) by mouth 2 (two) times daily. 360 capsule 11  . losartan-hydrochlorothiazide (HYZAAR) 100-25 MG tablet Take 1 tablet by mouth daily. (Patient taking differently: Take 0.5 tablets by mouth daily. TAKES 1/2 TABLET DAILY) 90 tablet 3  . Magnesium Hydroxide (DULCOLAX PO) Take by mouth as needed.    . Multiple Vitamin (MULTIVITAMIN) tablet Take 1 tablet by mouth daily.    . Polyethylene Glycol 3350 (MIRALAX PO) Take by mouth as needed.    Marland Kitchen POMALYST 2 MG capsule TAKE 1 CAPSULE BY MOUTH ONCE DAILY FOR 21 DAYS ON AND 7 DAYS OFF 21 capsule 0   No current facility-administered medications for this visit.    PHYSICAL EXAMINATION: ECOG PERFORMANCE STATUS: 1 - Symptomatic but completely ambulatory  LABORATORY DATA:  I have reviewed the data as listed CMP Latest Ref Rng & Units 01/18/2020 11/27/2019 10/18/2019  Glucose 70 - 99 mg/dL 80 84 96  BUN 8 - 23 mg/dL _0 Creatinine 0.44 - 1.00 mg/dL 1.18(H) 0.97 1.03(H)  Sodium 135 - 145 mmol/L 140 139 140  Potassium 3.5 - 5.1 mmol/L 4.0 4.2 3.9  Chloride 98 - 111 mmol/L 102 99 103  CO2 22 - 32 mmol/L _1 Calcium 8.9 - 10.3 mg/dL 9.3 8.8 9.2  Total Protein 6.5 - 8.1 g/dL 7.2 6.8 7.0  Total Bilirubin 0.3 - 1.2 mg/dL 0.9 0.9 0.7  Alkaline Phos 38 - 126 U/L 86 84 77  AST 15 - 41 U/L _0 ALT 0 - 44 U/L _1 Lab Results  Component Value Date   WBC 4.0 01/18/2020   HGB 12.2 01/18/2020   HCT 38.0 01/18/2020   MCV 88.8 01/18/2020   PLT 181 01/18/2020   NEUTROABS 1.5 (L) 01/18/2020    I discussed the assessment and treatment plan with the patient. The patient was  provided an opportunity to ask questions and all were answered. The patient agreed with the plan and demonstrated an understanding of the instructions. The patient was advised to call back or seek an in-person evaluation if the symptoms worsen or if the condition fails to improve as anticipated.    I spent 20 minutes for the appointment reviewing test results, discuss management and coordination of care.  Heath Lark, MD 01/25/2020 9:28 AM

## 2020-01-25 NOTE — Assessment & Plan Note (Signed)
She has intermittent elevated serum creatinine This could affect light chain results Her serum light chain ratio is near normal Observe for now

## 2020-01-25 NOTE — Telephone Encounter (Signed)
Scheduled per 9/10 sch msg. Called and left a msg, mailing appt letter and calendar printout

## 2020-01-25 NOTE — Assessment & Plan Note (Signed)
I encouraged the patient to continue to work with weight management clinic I congratulated her effort and her 5 pounds weight loss

## 2020-01-28 ENCOUNTER — Encounter (INDEPENDENT_AMBULATORY_CARE_PROVIDER_SITE_OTHER): Payer: Self-pay | Admitting: Family Medicine

## 2020-02-06 ENCOUNTER — Other Ambulatory Visit: Payer: Self-pay | Admitting: Hematology and Oncology

## 2020-02-11 ENCOUNTER — Other Ambulatory Visit: Payer: Self-pay

## 2020-02-11 ENCOUNTER — Ambulatory Visit (INDEPENDENT_AMBULATORY_CARE_PROVIDER_SITE_OTHER): Payer: Medicare PPO | Admitting: Family Medicine

## 2020-02-11 ENCOUNTER — Encounter (INDEPENDENT_AMBULATORY_CARE_PROVIDER_SITE_OTHER): Payer: Self-pay | Admitting: Family Medicine

## 2020-02-11 VITALS — BP 116/73 | HR 48 | Temp 97.8°F | Ht 68.0 in | Wt 277.0 lb

## 2020-02-11 DIAGNOSIS — Z6841 Body Mass Index (BMI) 40.0 and over, adult: Secondary | ICD-10-CM | POA: Diagnosis not present

## 2020-02-11 DIAGNOSIS — F5089 Other specified eating disorder: Secondary | ICD-10-CM

## 2020-02-11 DIAGNOSIS — E8881 Metabolic syndrome: Secondary | ICD-10-CM

## 2020-02-11 DIAGNOSIS — E88819 Insulin resistance, unspecified: Secondary | ICD-10-CM

## 2020-02-11 DIAGNOSIS — E66813 Obesity, class 3: Secondary | ICD-10-CM

## 2020-02-13 NOTE — Progress Notes (Signed)
Chief Complaint:   OBESITY Carolyn Newman is here to discuss her progress with her obesity treatment plan along with follow-up of her obesity related diagnoses. Carolyn Newman is on the Category 2 Plan and states she is following her eating plan approximately 65% of the time. Carolyn Newman states she is doing cardio and strength training for 65 minutes 30-45 times per week.  Today's visit was #: 4 Starting weight: 281 lbs Starting date: 11/27/2019 Today's weight: 277 lbs Today's date: 02/11/2020 Total lbs lost to date: 4 lbs Total lbs lost since last in-office visit: 0 Total weight loss percentage to date: -1.42%  Interim History: Carolyn Newman is meeting with her trainer Wednesday.  She will be getting her flu shot on October 15.  She has already gotten her COVID booster.  She says that she still struggles at night with snacking.  Wellbutrin and semaglutide were approved by Drs. Alvy Bimler and Gwenlyn Found, but she would like to hold on adding new medications just yet.  Assessment/Plan:   1. Metabolic syndrome With visceral obesity (19).  Goal: Lose 7-10% of starting weight. We discussed several lifestyle modifications today and she will continue to work on diet, exercise and weight loss efforts. She will continue to focus on protein-rich, low simple carbohydrate foods. We reviewed the importance of hydration, regular exercise for stress reduction, and restorative sleep.   2. Insulin resistance With polyphagia.  Carolyn Newman will continue to work on weight loss, exercise, and decreasing simple carbohydrates to help decrease the risk of diabetes.   Lab Results  Component Value Date   INSULIN 9.2 11/27/2019   Lab Results  Component Value Date   HGBA1C 5.6 11/27/2019   3. Other disorder of eating, night eating We discussed habit changes to decrease night and emotional eating.  4. Class 3 severe obesity with serious comorbidity and body mass index (BMI) of 40.0 to 44.9 in adult, unspecified obesity type  [facility])  Carolyn Newman is currently in the action stage of change. As such, her goal is to continue with weight loss efforts. She has agreed to the Category 2 Plan.   Exercise goals: For substantial health benefits, adults should do at least 150 minutes (2 hours and 30 minutes) a week of moderate-intensity, or 75 minutes (1 hour and 15 minutes) a week of vigorous-intensity aerobic physical activity, or an equivalent combination of moderate- and vigorous-intensity aerobic activity. Aerobic activity should be performed in episodes of at least 10 minutes, and preferably, it should be spread throughout the week.  Behavioral modification strategies: ways to avoid night time snacking.  We reviewed meal timing, increasing hydration, increasing volume, and decreasing caloric foods.  Carolyn Newman has agreed to follow-up with our clinic in 2-3 weeks. She was informed of the importance of frequent follow-up visits to maximize her success with intensive lifestyle modifications for her multiple health conditions.   Objective:   Blood pressure 116/73, pulse (!) 48, temperature 97.8 F (36.6 C), temperature source Oral, height 5\' 8"  (1.727 m), weight 277 lb (125.6 kg), SpO2 100 %. Body mass index is 42.12 kg/m.  General: Cooperative, alert, well developed, in no acute distress. HEENT: Conjunctivae and lids unremarkable. Cardiovascular: Regular rhythm.  Lungs: Normal work of breathing. Neurologic: No focal deficits.   Lab Results  Component Value Date   CREATININE 1.18 (H) 01/18/2020   BUN 18 01/18/2020   NA 140 01/18/2020   K 4.0 01/18/2020   CL 102 01/18/2020   CO2 30 01/18/2020   Lab Results  Component Value Date  ALT 8 01/18/2020   AST 16 01/18/2020   ALKPHOS 86 01/18/2020   BILITOT 0.9 01/18/2020   Lab Results  Component Value Date   HGBA1C 5.6 11/27/2019   Lab Results  Component Value Date   INSULIN 9.2 11/27/2019   Lab Results  Component Value Date   TSH 2.130 11/27/2019   Lab Results   Component Value Date   CHOL 215 (H) 11/27/2019   HDL 58 11/27/2019   LDLCALC 145 (H) 11/27/2019   TRIG 69 11/27/2019   Lab Results  Component Value Date   WBC 4.0 01/18/2020   HGB 12.2 01/18/2020   HCT 38.0 01/18/2020   MCV 88.8 01/18/2020   PLT 181 01/18/2020   Lab Results  Component Value Date   IRON 108 11/27/2019   TIBC 250 11/27/2019   FERRITIN 99 11/27/2019   Obesity Behavioral Intervention:   Approximately 15 minutes were spent on the discussion below.  ASK: We discussed the diagnosis of obesity with Carolyn Newman today and Carolyn Newman agreed to give [place] permission to discuss obesity behavioral modification therapy today.  ASSESS: Carolyn Newman has the diagnosis of obesity and her BMI today is 42.1. Carolyn Newman is in the action stage of change.   ADVISE: Carolyn Newman was educated on the multiple health risks of obesity as well as the benefit of weight loss to improve her health. She was advised of the need for long term treatment and the importance of lifestyle modifications to improve her current health and to decrease her risk of future health problems.  AGREE: Multiple dietary modification options and treatment options were discussed and Carolyn Newman agreed to follow the recommendations documented in the above note.  ARRANGE: Carolyn Newman was educated on the importance of frequent visits to treat obesity as outlined per CMS and USPSTF guidelines and agreed to schedule her next follow up appointment today.  Attestation Statements:   Reviewed by clinician on day of visit: allergies, medications, problem list, medical history, surgical history, family history, social history, and previous encounter notes.  I, Water quality scientist, CMA, am acting as transcriptionist for Briscoe Deutscher, DO  I have reviewed the above documentation for accuracy and completeness, and I agree with the above. Briscoe Deutscher, DO

## 2020-02-28 ENCOUNTER — Other Ambulatory Visit: Payer: Self-pay

## 2020-02-28 MED ORDER — POMALIDOMIDE 2 MG PO CAPS: ORAL_CAPSULE | ORAL | 0 refills | Status: DC

## 2020-03-04 ENCOUNTER — Encounter (INDEPENDENT_AMBULATORY_CARE_PROVIDER_SITE_OTHER): Payer: Self-pay | Admitting: Family Medicine

## 2020-03-04 ENCOUNTER — Ambulatory Visit (INDEPENDENT_AMBULATORY_CARE_PROVIDER_SITE_OTHER): Payer: Medicare PPO | Admitting: Family Medicine

## 2020-03-04 ENCOUNTER — Other Ambulatory Visit: Payer: Self-pay

## 2020-03-04 VITALS — BP 125/77 | HR 52 | Temp 97.8°F | Ht 68.0 in | Wt 279.0 lb

## 2020-03-04 DIAGNOSIS — Z6841 Body Mass Index (BMI) 40.0 and over, adult: Secondary | ICD-10-CM

## 2020-03-04 DIAGNOSIS — E8881 Metabolic syndrome: Secondary | ICD-10-CM | POA: Diagnosis not present

## 2020-03-04 DIAGNOSIS — M25561 Pain in right knee: Secondary | ICD-10-CM

## 2020-03-04 DIAGNOSIS — E66813 Obesity, class 3: Secondary | ICD-10-CM

## 2020-03-04 DIAGNOSIS — E65 Localized adiposity: Secondary | ICD-10-CM | POA: Diagnosis not present

## 2020-03-04 DIAGNOSIS — E88819 Insulin resistance, unspecified: Secondary | ICD-10-CM

## 2020-03-04 NOTE — Progress Notes (Signed)
Chief Complaint:   OBESITY Carolyn Newman is here to discuss her progress with her obesity treatment plan along with follow-up of her obesity related diagnoses.   Today's visit was #: 5 Starting weight: 281 lbs Starting date: 11/27/2019 Today's weight: 279 lbs Today's date: 03/04/2020 Total lbs lost to date: 2 lbs Body mass index is 42.42 kg/m.  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  She is going to bed earlier, which has helped to decrease her night eating.  She is getting 7-8 hours of sleep at night.  She has been going to [employer] and has a Clinical research associate.  Nutrition Plan: Category 2. Anti-obesity medications: None at this time. Note: Wellbutrin and semaglutide were approved by Drs. Alvy Bimler and Gwenlyn Found, but she would like to hold on adding new medications just yet. Hunger is moderately controlled controlled. Cravings are moderately controlled controlled.  Activity: Silver Sneakers, cardio, strength training for 45 minutes 2 times per week. Sleep: Number of hours slept each night: 7-8. Sleep is restful.   Assessment/Plan:   1. Insulin resistance Not optimized. Goal is HgbA1c < 5.7, fasting insulin closer to 5.  Medication: None.  She will continue to focus on protein-rich, low simple carbohydrate foods. We reviewed the importance of hydration, regular exercise for stress reduction, and restorative sleep.   Lab Results  Component Value Date   HGBA1C 5.6 11/27/2019   Lab Results  Component Value Date   INSULIN 9.2 11/27/2019   2. Metabolic syndrome Starting goal: Lose 7-10% of starting weight. She will continue to focus on protein-rich, low simple carbohydrate foods. We reviewed the importance of hydration, regular exercise for stress reduction, and restorative sleep.    Plan:  We will continue to check lab work every 3 months, with 10% weight loss, or should any other concerns arise.  3. Visceral  obesity Current visceral fat rating: 18. Visceral adipose tissue is a hormonally active component of total body fat. This body composition phenotype is associated with medical disorders such as metabolic syndrome, cardiovascular disease and several malignancies including prostate, breast, and colorectal cancers. Starting goal: Lose 7-10% of starting weight. Visceral fat rating should be < 13.  4. Arthralgia of right knee Carolyn Newman has pain in her right knee.  Will follow because mobility and pain control are important for weight management. This issue directly impacts care plan for optimization of BMI and metabolic health as it impacts the patient's ability to make lifestyle changes.  5. Class 3 severe obesity with serious comorbidity and body mass index (BMI) of 40.0 to 44.9 in adult, unspecified obesity type [facility])  Carolyn Newman is currently in the action stage of change. As such, her goal is to continue with weight loss efforts.   Nutrition goals: She has agreed to keeping a food journal and adhering to recommended goals of 1200 calories and 95 grams of protein.   Exercise goals: For substantial health benefits, adults should do at least 150 minutes (2 hours and 30 minutes) a week of moderate-intensity, or 75 minutes (1 hour and 15 minutes) a week of vigorous-intensity aerobic physical activity, or an equivalent combination of moderate- and vigorous-intensity aerobic activity. Aerobic activity should be performed in episodes of at least 10 minutes, and preferably, it should be spread throughout the week.  Behavioral modification strategies: increasing lean protein intake, decreasing simple carbohydrates, increasing vegetables and increasing water intake.  Carolyn Newman has agreed to follow-up with our clinic in 3  weeks. She was informed of the importance of frequent follow-up visits to maximize her success with intensive lifestyle modifications for her multiple health conditions.   Objective:   Blood  pressure 125/77, pulse (!) 52, temperature 97.8 F (36.6 C), temperature source Oral, height 5\' 8"  (1.727 m), weight 279 lb (126.6 kg), SpO2 99 %. Body mass index is 42.42 kg/m.  General: Cooperative, alert, well developed, in no acute distress. HEENT: Conjunctivae and lids unremarkable. Cardiovascular: Regular rhythm.  Lungs: Normal work of breathing. Neurologic: No focal deficits.   Lab Results  Component Value Date   CREATININE 1.18 (H) 01/18/2020   BUN 18 01/18/2020   NA 140 01/18/2020   K 4.0 01/18/2020   CL 102 01/18/2020   CO2 30 01/18/2020   Lab Results  Component Value Date   ALT 8 01/18/2020   AST 16 01/18/2020   ALKPHOS 86 01/18/2020   BILITOT 0.9 01/18/2020   Lab Results  Component Value Date   HGBA1C 5.6 11/27/2019   Lab Results  Component Value Date   INSULIN 9.2 11/27/2019   Lab Results  Component Value Date   TSH 2.130 11/27/2019   Lab Results  Component Value Date   CHOL 215 (H) 11/27/2019   HDL 58 11/27/2019   LDLCALC 145 (H) 11/27/2019   TRIG 69 11/27/2019   Lab Results  Component Value Date   WBC 4.0 01/18/2020   HGB 12.2 01/18/2020   HCT 38.0 01/18/2020   MCV 88.8 01/18/2020   PLT 181 01/18/2020   Lab Results  Component Value Date   IRON 108 11/27/2019   TIBC 250 11/27/2019   FERRITIN 99 11/27/2019   Attestation Statements:   Reviewed by clinician on day of visit: allergies, medications, problem list, medical history, surgical history, family history, social history, and previous encounter notes.  Time spent on visit including pre-visit chart review and post-visit care and charting was 30 minutes.   I, Water quality scientist, CMA, am acting as transcriptionist for Briscoe Deutscher, DO  I have reviewed the above documentation for accuracy and completeness, and I agree with the above. Briscoe Deutscher, DO

## 2020-03-11 ENCOUNTER — Telehealth: Payer: Self-pay

## 2020-03-11 DIAGNOSIS — Z20822 Contact with and (suspected) exposure to covid-19: Secondary | ICD-10-CM | POA: Diagnosis not present

## 2020-03-11 DIAGNOSIS — R509 Fever, unspecified: Secondary | ICD-10-CM | POA: Diagnosis not present

## 2020-03-11 DIAGNOSIS — R051 Acute cough: Secondary | ICD-10-CM | POA: Diagnosis not present

## 2020-03-11 NOTE — Telephone Encounter (Signed)
She called and left a message to call her.  Called back. She has a temp of 100.8, with a cough and sinus drainage with congestion. Sinus congestion started on Monday. She would like to get a COVID test. Given phone # to call at [facility] to schedule test. Or she can go to local drug store. She verbalized understanding. Instructed to call the office back if needed and with covid results.  FYI

## 2020-03-12 ENCOUNTER — Telehealth: Payer: Self-pay

## 2020-03-12 NOTE — Telephone Encounter (Signed)
Sounds viral Since she is no longer febrile, no need antibiotics from my perspective

## 2020-03-12 NOTE — Telephone Encounter (Signed)
She called to give update. Rapid covid test came back negative. They are sending the covid screening off for a PCR test that will take 1 to 2 days.  She no longer has fever. Still c/o of nasal congestion. She has a thick productive green/yellow sputum with a cough. She is concerned and ask what she should do?

## 2020-03-12 NOTE — Telephone Encounter (Signed)
Called and given below message. She verbalized understanding. She will take it easy today and call the office back for worsening symptoms/ changes.

## 2020-03-15 DIAGNOSIS — I10 Essential (primary) hypertension: Secondary | ICD-10-CM | POA: Diagnosis not present

## 2020-03-15 DIAGNOSIS — C9001 Multiple myeloma in remission: Secondary | ICD-10-CM | POA: Diagnosis not present

## 2020-03-15 DIAGNOSIS — E782 Mixed hyperlipidemia: Secondary | ICD-10-CM | POA: Diagnosis not present

## 2020-03-15 DIAGNOSIS — J069 Acute upper respiratory infection, unspecified: Secondary | ICD-10-CM | POA: Diagnosis not present

## 2020-03-17 ENCOUNTER — Other Ambulatory Visit: Payer: Self-pay | Admitting: Nurse Practitioner

## 2020-03-17 ENCOUNTER — Ambulatory Visit
Admission: RE | Admit: 2020-03-17 | Discharge: 2020-03-17 | Disposition: A | Discharge status: Home / Self Care | Payer: Medicare PPO | Source: Ambulatory Visit | Attending: Nurse Practitioner | Admitting: Nurse Practitioner

## 2020-03-17 DIAGNOSIS — R059 Cough, unspecified: Secondary | ICD-10-CM | POA: Diagnosis not present

## 2020-03-17 DIAGNOSIS — R062 Wheezing: Secondary | ICD-10-CM

## 2020-03-17 DIAGNOSIS — I10 Essential (primary) hypertension: Secondary | ICD-10-CM | POA: Diagnosis not present

## 2020-03-17 DIAGNOSIS — E782 Mixed hyperlipidemia: Secondary | ICD-10-CM | POA: Diagnosis not present

## 2020-03-17 DIAGNOSIS — J069 Acute upper respiratory infection, unspecified: Secondary | ICD-10-CM | POA: Diagnosis not present

## 2020-03-26 ENCOUNTER — Encounter: Payer: Self-pay | Admitting: Hematology and Oncology

## 2020-03-27 DIAGNOSIS — Z1231 Encounter for screening mammogram for malignant neoplasm of breast: Secondary | ICD-10-CM | POA: Diagnosis not present

## 2020-03-31 ENCOUNTER — Other Ambulatory Visit: Payer: Self-pay

## 2020-03-31 ENCOUNTER — Encounter (INDEPENDENT_AMBULATORY_CARE_PROVIDER_SITE_OTHER): Payer: Self-pay | Admitting: Family Medicine

## 2020-03-31 ENCOUNTER — Ambulatory Visit (INDEPENDENT_AMBULATORY_CARE_PROVIDER_SITE_OTHER): Payer: Medicare PPO | Admitting: Family Medicine

## 2020-03-31 VITALS — BP 119/73 | HR 59 | Temp 98.1°F | Ht 68.0 in | Wt 273.0 lb

## 2020-03-31 DIAGNOSIS — E8881 Metabolic syndrome: Secondary | ICD-10-CM | POA: Diagnosis not present

## 2020-03-31 DIAGNOSIS — Z6841 Body Mass Index (BMI) 40.0 and over, adult: Secondary | ICD-10-CM

## 2020-03-31 DIAGNOSIS — E65 Localized adiposity: Secondary | ICD-10-CM | POA: Diagnosis not present

## 2020-03-31 DIAGNOSIS — E785 Hyperlipidemia, unspecified: Secondary | ICD-10-CM | POA: Diagnosis not present

## 2020-04-02 NOTE — Progress Notes (Signed)
Chief Complaint:   OBESITY Fontaine is here to discuss her progress with her obesity treatment plan along with follow-up of her obesity related diagnoses.   Today's visit was #: 6 Starting weight: 281 lbs Starting date: 11/27/2019 Today's weight: 273 lbs Today's date: 03/31/2020 Total lbs lost to date: 8 lbs Body mass index is 41.51 kg/m.  Total weight loss percentage to date: -2.85%  Interim History: Modesty says she has recently had bronchitis/sinusitis.  Her PCP prescribed Crestor for cholesterol.  She started taking it last week.  She wast to start Weight Watchers.  She will earn points for eating non-starch vegetables.  Nutrition Plan: keeping a food journal and adhering to recommended goals of 1200 calories and 95 grams of protein.  Anti-obesity medications: None.  Activity: None.  Assessment/Plan:   1. Metabolic syndrome Starting goal: Lose 7-10% of starting weight. She will continue to focus on protein-rich, low simple carbohydrate foods. We reviewed the importance of hydration, regular exercise for stress reduction, and restorative sleep.  We will continue to check lab work every 3 months, with 10% weight loss, or should any other concerns arise.  2. Visceral obesity Current visceral fat rating: 18. Visceral fat rating should be < 13. Visceral adipose tissue is a hormonally active component of total body fat. This body composition phenotype is associated with medical disorders such as metabolic syndrome, cardiovascular disease and several malignancies including prostate, breast, and colorectal cancers. Starting goal: Lose 7-10% of starting weight.   3. Insulin resistance At goal. Goal is HgbA1c < 5.7, fasting insulin closer to 5.  The current medical regimen is effective;  continue present plan and medications.  Lab Results  Component Value Date   HGBA1C 5.6 11/27/2019   Lab Results  Component Value Date   INSULIN 9.2 11/27/2019   4. Hyperlipidemia, unspecified  hyperlipidemia type Lipid-lowering medications: Crestor 10 mg daily.   Plan: Dietary changes: Increase soluble fiber. Decrease simple carbohydrates. Exercise changes: An average 40 minutes of moderate to vigorous-intensity aerobic activity 3 or 4 times per week.   Lab Results  Component Value Date   CHOL 215 (H) 11/27/2019   HDL 58 11/27/2019   LDLCALC 145 (H) 11/27/2019   TRIG 69 11/27/2019   Lab Results  Component Value Date   ALT 8 01/18/2020   AST 16 01/18/2020   ALKPHOS 86 01/18/2020   BILITOT 0.9 01/18/2020   The 10-year ASCVD risk score Mikey Bussing DC Jr., et al., 2013) is: 9.9%   Values used to calculate the score:     Age: 48 years     Sex: Female     Is Non-Hispanic African American: Yes     Diabetic: No     Tobacco smoker: No     Systolic Blood Pressure: 709 mmHg     Is BP treated: Yes     HDL Cholesterol: 58 mg/dL     Total Cholesterol: 215 mg/dL  5. Class 3 severe obesity with serious comorbidity and body mass index (BMI) of 40.0 to 44.9 in adult, unspecified obesity type ([place])  Course: Earlie is currently in the action stage of change. As such, her goal is to continue with weight loss efforts.   Nutrition goals: She has agreed to start Weight Watchers.    Exercise goals: For substantial health benefits, adults should do at least 150 minutes (2 hours and 30 minutes) a week of moderate-intensity, or 75 minutes (1 hour and 15 minutes) a week of vigorous-intensity aerobic physical activity,  or an equivalent combination of moderate- and vigorous-intensity aerobic activity. Aerobic activity should be performed in episodes of at least 10 minutes, and preferably, it should be spread throughout the week.  Behavioral modification strategies: increasing lean protein intake, decreasing simple carbohydrates, increasing vegetables and increasing water intake.  Anaiah has agreed to follow-up with our clinic within 6 months to continue the program. She was informed of the  importance of frequent follow-up visits to maximize her success with intensive lifestyle modifications for her multiple health conditions.   Objective:   Blood pressure 119/73, pulse (!) 59, temperature 98.1 F (36.7 C), height 5\' 8"  (1.727 m), weight 273 lb (123.8 kg), SpO2 100 %. Body mass index is 41.51 kg/m.  General: Cooperative, alert, well developed, in no acute distress. HEENT: Conjunctivae and lids unremarkable. Cardiovascular: Regular rhythm.  Lungs: Normal work of breathing. Neurologic: No focal deficits.   Lab Results  Component Value Date   CREATININE 1.18 (H) 01/18/2020   BUN 18 01/18/2020   NA 140 01/18/2020   K 4.0 01/18/2020   CL 102 01/18/2020   CO2 30 01/18/2020   Lab Results  Component Value Date   ALT 8 01/18/2020   AST 16 01/18/2020   ALKPHOS 86 01/18/2020   BILITOT 0.9 01/18/2020   Lab Results  Component Value Date   HGBA1C 5.6 11/27/2019   Lab Results  Component Value Date   INSULIN 9.2 11/27/2019   Lab Results  Component Value Date   TSH 2.130 11/27/2019   Lab Results  Component Value Date   CHOL 215 (H) 11/27/2019   HDL 58 11/27/2019   LDLCALC 145 (H) 11/27/2019   TRIG 69 11/27/2019   Lab Results  Component Value Date   WBC 4.0 01/18/2020   HGB 12.2 01/18/2020   HCT 38.0 01/18/2020   MCV 88.8 01/18/2020   PLT 181 01/18/2020   Lab Results  Component Value Date   IRON 108 11/27/2019   TIBC 250 11/27/2019   FERRITIN 99 11/27/2019   Obesity Behavioral Intervention:   Approximately 15 minutes were spent on the discussion below.  ASK: We discussed the diagnosis of obesity with Neoma Laming today and Airyanna agreed to give [place] permission to discuss obesity behavioral modification therapy today.  ASSESS: Manuel has the diagnosis of obesity and her BMI today is 41.6. Brynlynn is in the action stage of change.   ADVISE: Arlys was educated on the multiple health risks of obesity as well as the benefit of weight loss to improve her  health. She was advised of the need for long term treatment and the importance of lifestyle modifications to improve her current health and to decrease her risk of future health problems.  AGREE: Multiple dietary modification options and treatment options were discussed and Ezrie agreed to follow the recommendations documented in the above note.  ARRANGE: Lalaine was educated on the importance of frequent visits to treat obesity as outlined per CMS and USPSTF guidelines and agreed to schedule her next follow up appointment today.  Attestation Statements:   Reviewed by clinician on day of visit: allergies, medications, problem list, medical history, surgical history, family history, social history, and previous encounter notes.  I, Water quality scientist, CMA, am acting as transcriptionist for Briscoe Deutscher, DO  I have reviewed the above documentation for accuracy and completeness, and I agree with the above. Briscoe Deutscher, DO

## 2020-04-09 ENCOUNTER — Other Ambulatory Visit: Payer: Self-pay | Admitting: Hematology and Oncology

## 2020-04-09 NOTE — Telephone Encounter (Signed)
Pls refill electronically °

## 2020-04-13 DIAGNOSIS — R7303 Prediabetes: Secondary | ICD-10-CM | POA: Insufficient documentation

## 2020-04-13 DIAGNOSIS — E65 Localized adiposity: Secondary | ICD-10-CM | POA: Insufficient documentation

## 2020-04-13 DIAGNOSIS — E8881 Metabolic syndrome: Secondary | ICD-10-CM | POA: Insufficient documentation

## 2020-04-18 ENCOUNTER — Inpatient Hospital Stay: Payer: Medicare PPO | Attending: Hematology and Oncology

## 2020-04-18 ENCOUNTER — Other Ambulatory Visit: Payer: Self-pay

## 2020-04-18 ENCOUNTER — Telehealth: Payer: No Typology Code available for payment source | Admitting: Hematology and Oncology

## 2020-04-18 DIAGNOSIS — C9000 Multiple myeloma not having achieved remission: Secondary | ICD-10-CM

## 2020-04-18 DIAGNOSIS — C9001 Multiple myeloma in remission: Secondary | ICD-10-CM | POA: Diagnosis not present

## 2020-04-18 LAB — COMPREHENSIVE METABOLIC PANEL
ALT: 8 U/L (ref 0–44)
AST: 21 U/L (ref 15–41)
Albumin: 3.6 g/dL (ref 3.5–5.0)
Alkaline Phosphatase: 74 U/L (ref 38–126)
Anion gap: 10 (ref 5–15)
BUN: 15 mg/dL (ref 8–23)
CO2: 27 mmol/L (ref 22–32)
Calcium: 9.7 mg/dL (ref 8.9–10.3)
Chloride: 103 mmol/L (ref 98–111)
Creatinine, Ser: 1 mg/dL (ref 0.44–1.00)
GFR, Estimated: >60 mL/min (ref >60)
Glucose, Bld: 82 mg/dL (ref 70–99)
Potassium: 4.2 mmol/L (ref 3.5–5.1)
Sodium: 140 mmol/L (ref 135–145)
Total Bilirubin: 1.1 mg/dL (ref 0.3–1.2)
Total Protein: 7.5 g/dL (ref 6.5–8.1)

## 2020-04-18 LAB — CBC WITH DIFFERENTIAL/PLATELET
Abs Immature Granulocytes: 0.01 10*3/uL (ref 0.00–0.07)
Basophils Absolute: 0.1 10*3/uL (ref 0.0–0.1)
Basophils Relative: 2 %
Eosinophils Absolute: 0.1 10*3/uL (ref 0.0–0.5)
Eosinophils Relative: 2 %
HCT: 39.3 % (ref 36.0–46.0)
Hemoglobin: 12.5 g/dL (ref 12.0–15.0)
Immature Granulocytes: 0 %
Lymphocytes Relative: 35 %
Lymphs Abs: 1.6 10*3/uL (ref 0.7–4.0)
MCH: 28.4 pg (ref 26.0–34.0)
MCHC: 31.8 g/dL (ref 30.0–36.0)
MCV: 89.3 fL (ref 80.0–100.0)
Monocytes Absolute: 0.4 10*3/uL (ref 0.1–1.0)
Monocytes Relative: 10 %
Neutro Abs: 2.3 10*3/uL (ref 1.7–7.7)
Neutrophils Relative %: 51 %
Platelets: 259 10*3/uL (ref 150–400)
RBC: 4.4 MIL/uL (ref 3.87–5.11)
RDW: 15.3 % (ref 11.5–15.5)
WBC: 4.5 10*3/uL (ref 4.0–10.5)
nRBC: 0 % (ref 0.0–0.2)

## 2020-04-21 LAB — MULTIPLE MYELOMA PANEL, SERUM
Albumin SerPl Elph-Mcnc: 3.5 g/dL (ref 2.9–4.4)
Albumin/Glob SerPl: 1.1 (ref 0.7–1.7)
Alpha 1: 0.2 g/dL (ref 0.0–0.4)
Alpha2 Glob SerPl Elph-Mcnc: 0.9 g/dL (ref 0.4–1.0)
B-Globulin SerPl Elph-Mcnc: 1 g/dL (ref 0.7–1.3)
Gamma Glob SerPl Elph-Mcnc: 1.3 g/dL (ref 0.4–1.8)
Globulin, Total: 3.4 g/dL (ref 2.2–3.9)
IgA: 225 mg/dL (ref 87–352)
IgG (Immunoglobin G), Serum: 1229 mg/dL (ref 586–1602)
IgM (Immunoglobulin M), Srm: 39 mg/dL (ref 26–217)
Total Protein ELP: 6.9 g/dL (ref 6.0–8.5)

## 2020-04-21 LAB — KAPPA/LAMBDA LIGHT CHAINS
Kappa free light chain: 37 mg/L — ABNORMAL HIGH (ref 3.3–19.4)
Kappa, lambda light chain ratio: 1.47 (ref 0.26–1.65)
Lambda free light chains: 25.2 mg/L (ref 5.7–26.3)

## 2020-04-25 ENCOUNTER — Telehealth (HOSPITAL_BASED_OUTPATIENT_CLINIC_OR_DEPARTMENT_OTHER): Payer: Medicare PPO | Admitting: Hematology and Oncology

## 2020-04-25 ENCOUNTER — Encounter: Payer: Self-pay | Admitting: Hematology and Oncology

## 2020-04-25 ENCOUNTER — Telehealth: Payer: Self-pay | Admitting: Hematology and Oncology

## 2020-04-25 DIAGNOSIS — T451X5A Adverse effect of antineoplastic and immunosuppressive drugs, initial encounter: Secondary | ICD-10-CM | POA: Diagnosis not present

## 2020-04-25 DIAGNOSIS — C9001 Multiple myeloma in remission: Secondary | ICD-10-CM

## 2020-04-25 DIAGNOSIS — K5909 Other constipation: Secondary | ICD-10-CM

## 2020-04-25 DIAGNOSIS — Z6841 Body Mass Index (BMI) 40.0 and over, adult: Secondary | ICD-10-CM | POA: Diagnosis not present

## 2020-04-25 DIAGNOSIS — G620 Drug-induced polyneuropathy: Secondary | ICD-10-CM

## 2020-04-25 MED ORDER — GABAPENTIN 300 MG PO CAPS
900.0000 mg | ORAL_CAPSULE | Freq: Two times a day (BID) | ORAL | 11 refills | Status: DC
Start: 2020-04-25 — End: 2020-07-25

## 2020-04-25 NOTE — Assessment & Plan Note (Signed)
Her neuropathy is stable I refill her prescription gabapentin

## 2020-04-25 NOTE — Assessment & Plan Note (Signed)
She tolerated pomalidomide well No recent infection Recent myeloma showed complete response.  She will continue treatment as directed She will continue aspirin for DVT prophylaxis Her recent bone density scan is satisfactory I recommend discontinuation of Zometa from previous visit She will continue calcium with vitamin D

## 2020-04-25 NOTE — Telephone Encounter (Signed)
Scheduled appt per 12/10 sch msg - pt is aware of appt date and time   

## 2020-04-25 NOTE — Assessment & Plan Note (Signed)
She is highly motivated She is doing weight watchers program and exercising through [employer] program She have lost some weight I encouraged the patient to continue her best effort

## 2020-04-25 NOTE — Progress Notes (Signed)
HEMATOLOGY-ONCOLOGY ELECTRONIC VISIT PROGRESS NOTE  Patient Care Team: Lucianne Lei, MD as PCP - General (Family Medicine) Jodi Marble, MD as Consulting Physician (Otolaryngology) Heath Lark, MD as Consulting Physician (Hematology and Oncology)  I connected with by [facility] video conference and verified that I am speaking with the correct person using two identifiers.  I discussed the limitations, risks, security and privacy concerns of performing an evaluation and management service by [facility] and the availability of in person appointments.  I also discussed with the patient that there may be a patient responsible charge related to this service. The patient expressed understanding and agreed to proceed.   ASSESSMENT & PLAN:  Multiple myeloma in remission ([place]) She tolerated pomalidomide well No recent infection Recent myeloma showed complete response.  She will continue treatment as directed She will continue aspirin for DVT prophylaxis Her recent bone density scan is satisfactory I recommend discontinuation of Zometa from previous visit She will continue calcium with vitamin D  Class 3 severe obesity with serious comorbidity and body mass index (BMI) of 40.0 to 44.9 in adult [facility]) She is highly motivated She is doing weight watchers program and exercising through [employer] program She have lost some weight I encouraged the patient to continue her best effort  Peripheral neuropathy due to chemotherapy ([place]) Her neuropathy is stable I refill her prescription gabapentin  Other constipation She understand the importance of taking laxatives while on gabapentin to avoid severe constipation   Orders Placed This Encounter  Procedures  . CBC with Differential/Platelet    Standing Status:   Standing    Number of Occurrences:   22    Standing Expiration Date:   04/25/2021  . Comprehensive metabolic panel    Standing Status:   Standing    Number of Occurrences:   33    Standing  Expiration Date:   04/25/2021  . Kappa/lambda light chains    Standing Status:   Standing    Number of Occurrences:   22    Standing Expiration Date:   04/25/2021  . Multiple Myeloma Panel (SPEP&IFE w/QIG)    Standing Status:   Standing    Number of Occurrences:   22    Standing Expiration Date:   04/25/2021  . VITAMIN D 25 Hydroxy (Vit-D Deficiency, Fractures)    Standing Status:   Standing    Number of Occurrences:   1    Standing Expiration Date:   04/25/2021    INTERVAL HISTORY: Please see below for problem oriented charting. The purpose of today's visit is to review her recent myeloma panel results She is doing well She denies missing doses of Pomalyst No recent infection, fever or chills No new bone pain Her peripheral neuropathy is stable She is motivated to lose weight.  Her weight is down to 271 pounds  SUMMARY OF ONCOLOGIC HISTORY: Oncology History  Multiple myeloma in remission ([place])  03/22/2016 Bone Marrow Biopsy   Bone Marrow Biopsy: Plasma cells 17% by Aspirate and 30% by CD 138 stain. Plasma cell neoplasm, Kappa Restricted Normal cytogenetics, FISH positive for +11 and +14 and +7   04/13/2016 Imaging   Skeletal survey showed lucencies within the calvarium worrisome for myeloma. No definite abnormal lytic or blastic lesions are observed elsewhere.   04/19/2016 - 09/10/2016 Chemotherapy   The patient consented to treatment with Revlimid, dexamethasone and Velcade   09/14/2016 Bone Marrow Biopsy   In summary, there is a normal cellular marrow with trilineage hematopoiesis.A mild plasmacytosis is present,  but there is no definitive evidence of residual multiple myeloma   10/12/2016 - 10/12/2016 Chemotherapy   She received melphalan as conditioning chemo   10/13/2016 Bone Marrow Transplant   She received autologous stem cell transplant at [facility]   01/24/2017 Bone Marrow Biopsy   BONE MARROW: -Normocellular marrow for age (50%) with trilineage  hematopoiesis. -No morphologic or immunohistochemical evidence of involvement by plasma cell neoplasm (see comment)  PERIPHERAL BLOOD: -Unremarkable (see CBC data)  COMMENT: The bone marrow is normocellular for age and shows adequate trilineage hematopoiesis without significant (<10%) dysplastic changes present. Blasts are not increased. Plasma cells represent about 2% of total cells on aspirate smears. Appropriately controlled immunohistochemical stains are performed on the core biopsy. CD138 highlights small interstitial plasma cells comprising approximately 2% of total cells. These plasma cells appear polytypic as demonstrated by kappa and lambda in-situ hybridization.   01/25/2017 PET scan   No FDG avid osseous lesions or masses are identified.   02/18/2017 - 04/01/2017 Chemotherapy   She received weekly Velcade, Pomalyst and Dexamethasone   04/15/2017 -  Chemotherapy   The patient had Pomalyst only   04/20/2017 Bone Marrow Biopsy   Bone Marrow (BM) and Peripheral Blood (PB) FINAL PATHOLOGIC DIAGNOSIS BONE MARROW: Normocellular bone marrow (40%) with normal numbers of megakaryocytes, erythroid hyperplasia and no increase in plasma cells (1%).     REVIEW OF SYSTEMS:   Constitutional: Denies fevers, chills or abnormal weight loss Eyes: Denies blurriness of vision Ears, nose, mouth, throat, and face: Denies mucositis or sore throat Respiratory: Denies cough, dyspnea or wheezes Cardiovascular: Denies palpitation, chest discomfort Gastrointestinal:  Denies nausea, heartburn or change in bowel habits Skin: Denies abnormal skin rashes Lymphatics: Denies new lymphadenopathy or easy bruising Behavioral/Psych: Mood is stable, no new changes  Extremities: No lower extremity edema All other systems were reviewed with the patient and are negative.  I have reviewed the past medical history, past surgical history, social history and family history with the patient and they are  unchanged from previous note.  ALLERGIES:  is allergic to revlimid [lenalidomide].  MEDICATIONS:  Current Outpatient Medications  Medication Sig Dispense Refill  . acetaminophen (TYLENOL) 500 MG tablet Take 500 mg by mouth every 6 (six) hours as needed.    . ASPIRIN 81 PO Take 81 mg by mouth daily.    . Calcium Carbonate-Vitamin D (CALCIUM PLUS VITAMIN D PO) Take 1 tablet daily by mouth.    . fluticasone (FLONASE) 50 MCG/ACT nasal spray Place into both nostrils daily as needed for allergies or rhinitis.    Marland Kitchen gabapentin (NEURONTIN) 300 MG capsule Take 3 capsules (900 mg total) by mouth 2 (two) times daily. 360 capsule 11  . losartan-hydrochlorothiazide (HYZAAR) 100-25 MG tablet Take 1 tablet by mouth daily. (Patient taking differently: Take 0.5 tablets by mouth daily. TAKES 1/2 TABLET DAILY) 90 tablet 3  . Magnesium Hydroxide (DULCOLAX PO) Take by mouth as needed.    . Multiple Vitamin (MULTIVITAMIN) tablet Take 1 tablet by mouth daily.    . Polyethylene Glycol 3350 (MIRALAX PO) Take by mouth as needed.    Marland Kitchen POMALYST 2 MG capsule TAKE 1 CAPSULE BY MOUTH ONCE DAILY FOR 21 DAYS ON AND 7 DAYS OFF 21 capsule 0  . rosuvastatin (CRESTOR) 10 MG tablet Take 10 mg by mouth as directed. One tablet before bedtime.     No current facility-administered medications for this visit.    PHYSICAL EXAMINATION: ECOG PERFORMANCE STATUS: 1 - Symptomatic but completely ambulatory  LABORATORY DATA:  I have reviewed the data as listed CMP Latest Ref Rng & Units 04/18/2020 01/18/2020 11/27/2019  Glucose 70 - 99 mg/dL 82 80 84  BUN 8 - 23 mg/dL _0 Creatinine 0.44 - 1.00 mg/dL 1.00 1.18(H) 0.97  Sodium 135 - 145 mmol/L 140 140 139  Potassium 3.5 - 5.1 mmol/L 4.2 4.0 4.2  Chloride 98 - 111 mmol/L 103 102 99  CO2 22 - 32 mmol/L _1 Calcium 8.9 - 10.3 mg/dL 9.7 9.3 8.8  Total Protein 6.5 - 8.1 g/dL 7.5 7.2 6.8  Total Bilirubin 0.3 - 1.2 mg/dL 1.1 0.9 0.9  Alkaline Phos 38 - 126 U/L 74 86 84  AST 15 -  41 U/L _2 ALT 0 - 44 U/L _3 Lab Results  Component Value Date   WBC 4.5 04/18/2020   HGB 12.5 04/18/2020   HCT 39.3 04/18/2020   MCV 89.3 04/18/2020   PLT 259 04/18/2020   NEUTROABS 2.3 04/18/2020    I discussed the assessment and treatment plan with the patient. The patient was provided an opportunity to ask questions and all were answered. The patient agreed with the plan and demonstrated an understanding of the instructions. The patient was advised to call back or seek an in-person evaluation if the symptoms worsen or if the condition fails to improve as anticipated.    I spent 20 minutes for the appointment reviewing test results, discuss management and coordination of care.  Heath Lark, MD 04/25/2020 10:07 AM

## 2020-04-25 NOTE — Assessment & Plan Note (Signed)
She understand the importance of taking laxatives while on gabapentin to avoid severe constipation

## 2020-05-05 ENCOUNTER — Other Ambulatory Visit: Payer: Self-pay | Admitting: Hematology and Oncology

## 2020-05-05 NOTE — Telephone Encounter (Signed)
Pls refill electronically °

## 2020-05-07 DIAGNOSIS — M25561 Pain in right knee: Secondary | ICD-10-CM | POA: Diagnosis not present

## 2020-05-07 DIAGNOSIS — M170 Bilateral primary osteoarthritis of knee: Secondary | ICD-10-CM | POA: Diagnosis not present

## 2020-05-07 DIAGNOSIS — M25562 Pain in left knee: Secondary | ICD-10-CM | POA: Diagnosis not present

## 2020-05-26 ENCOUNTER — Other Ambulatory Visit: Payer: Self-pay

## 2020-05-26 MED ORDER — POMALIDOMIDE 2 MG PO CAPS
ORAL_CAPSULE | ORAL | 0 refills | Status: DC
Start: 2020-05-26 — End: 2020-06-19

## 2020-06-19 ENCOUNTER — Other Ambulatory Visit: Payer: Self-pay

## 2020-06-19 MED ORDER — POMALIDOMIDE 2 MG PO CAPS
ORAL_CAPSULE | ORAL | 0 refills | Status: DC
Start: 2020-06-19 — End: 2020-07-23

## 2020-07-17 DIAGNOSIS — M1711 Unilateral primary osteoarthritis, right knee: Secondary | ICD-10-CM | POA: Diagnosis not present

## 2020-07-17 DIAGNOSIS — M170 Bilateral primary osteoarthritis of knee: Secondary | ICD-10-CM | POA: Diagnosis not present

## 2020-07-17 DIAGNOSIS — M25562 Pain in left knee: Secondary | ICD-10-CM | POA: Diagnosis not present

## 2020-07-17 DIAGNOSIS — M25561 Pain in right knee: Secondary | ICD-10-CM | POA: Diagnosis not present

## 2020-07-18 ENCOUNTER — Other Ambulatory Visit: Payer: Self-pay

## 2020-07-18 ENCOUNTER — Inpatient Hospital Stay: Payer: Medicare PPO | Attending: Hematology and Oncology

## 2020-07-18 DIAGNOSIS — G620 Drug-induced polyneuropathy: Secondary | ICD-10-CM | POA: Diagnosis not present

## 2020-07-18 DIAGNOSIS — C9001 Multiple myeloma in remission: Secondary | ICD-10-CM | POA: Diagnosis not present

## 2020-07-18 DIAGNOSIS — T451X5A Adverse effect of antineoplastic and immunosuppressive drugs, initial encounter: Secondary | ICD-10-CM | POA: Insufficient documentation

## 2020-07-18 DIAGNOSIS — R7989 Other specified abnormal findings of blood chemistry: Secondary | ICD-10-CM | POA: Diagnosis not present

## 2020-07-18 DIAGNOSIS — R5383 Other fatigue: Secondary | ICD-10-CM | POA: Diagnosis not present

## 2020-07-18 LAB — CBC WITH DIFFERENTIAL/PLATELET
Abs Immature Granulocytes: 0.04 10*3/uL (ref 0.00–0.07)
Basophils Absolute: 0.1 10*3/uL (ref 0.0–0.1)
Basophils Relative: 2 %
Eosinophils Absolute: 0.2 10*3/uL (ref 0.0–0.5)
Eosinophils Relative: 5 %
HCT: 38.5 % (ref 36.0–46.0)
Hemoglobin: 12.4 g/dL (ref 12.0–15.0)
Immature Granulocytes: 1 %
Lymphocytes Relative: 34 %
Lymphs Abs: 1.5 10*3/uL (ref 0.7–4.0)
MCH: 28.9 pg (ref 26.0–34.0)
MCHC: 32.2 g/dL (ref 30.0–36.0)
MCV: 89.7 fL (ref 80.0–100.0)
Monocytes Absolute: 0.4 10*3/uL (ref 0.1–1.0)
Monocytes Relative: 9 %
Neutro Abs: 2.2 10*3/uL (ref 1.7–7.7)
Neutrophils Relative %: 49 %
Platelets: 237 10*3/uL (ref 150–400)
RBC: 4.29 MIL/uL (ref 3.87–5.11)
RDW: 15.9 % — ABNORMAL HIGH (ref 11.5–15.5)
WBC: 4.4 10*3/uL (ref 4.0–10.5)
nRBC: 0 % (ref 0.0–0.2)

## 2020-07-18 LAB — COMPREHENSIVE METABOLIC PANEL
ALT: 15 U/L (ref 0–44)
AST: 24 U/L (ref 15–41)
Albumin: 3.6 g/dL (ref 3.5–5.0)
Alkaline Phosphatase: 77 U/L (ref 38–126)
Anion gap: 8 (ref 5–15)
BUN: 16 mg/dL (ref 8–23)
CO2: 28 mmol/L (ref 22–32)
Calcium: 9.3 mg/dL (ref 8.9–10.3)
Chloride: 101 mmol/L (ref 98–111)
Creatinine, Ser: 1.04 mg/dL — ABNORMAL HIGH (ref 0.44–1.00)
GFR, Estimated: 59 mL/min — ABNORMAL LOW (ref >60)
Glucose, Bld: 61 mg/dL — ABNORMAL LOW (ref 70–99)
Potassium: 3.9 mmol/L (ref 3.5–5.1)
Sodium: 137 mmol/L (ref 135–145)
Total Bilirubin: 0.6 mg/dL (ref 0.3–1.2)
Total Protein: 7.3 g/dL (ref 6.5–8.1)

## 2020-07-18 LAB — VITAMIN D 25 HYDROXY (VIT D DEFICIENCY, FRACTURES): Vit D, 25-Hydroxy: 89.78 ng/mL (ref 30–100)

## 2020-07-21 LAB — KAPPA/LAMBDA LIGHT CHAINS
Kappa free light chain: 36.3 mg/L — ABNORMAL HIGH (ref 3.3–19.4)
Kappa, lambda light chain ratio: 1.43 (ref 0.26–1.65)
Lambda free light chains: 25.4 mg/L (ref 5.7–26.3)

## 2020-07-22 LAB — MULTIPLE MYELOMA PANEL, SERUM
Albumin SerPl Elph-Mcnc: 3.5 g/dL (ref 2.9–4.4)
Albumin/Glob SerPl: 1.1 (ref 0.7–1.7)
Alpha 1: 0.2 g/dL (ref 0.0–0.4)
Alpha2 Glob SerPl Elph-Mcnc: 0.9 g/dL (ref 0.4–1.0)
B-Globulin SerPl Elph-Mcnc: 1 g/dL (ref 0.7–1.3)
Gamma Glob SerPl Elph-Mcnc: 1.1 g/dL (ref 0.4–1.8)
Globulin, Total: 3.2 g/dL (ref 2.2–3.9)
IgA: 219 mg/dL (ref 87–352)
IgG (Immunoglobin G), Serum: 1239 mg/dL (ref 586–1602)
IgM (Immunoglobulin M), Srm: 26 mg/dL (ref 26–217)
Total Protein ELP: 6.7 g/dL (ref 6.0–8.5)

## 2020-07-23 ENCOUNTER — Other Ambulatory Visit: Payer: Self-pay | Admitting: Hematology and Oncology

## 2020-07-23 NOTE — Telephone Encounter (Signed)
Pls refill electronically °

## 2020-07-25 ENCOUNTER — Encounter: Payer: Self-pay | Admitting: Hematology and Oncology

## 2020-07-25 ENCOUNTER — Inpatient Hospital Stay (HOSPITAL_BASED_OUTPATIENT_CLINIC_OR_DEPARTMENT_OTHER): Payer: Medicare PPO | Admitting: Hematology and Oncology

## 2020-07-25 ENCOUNTER — Other Ambulatory Visit: Payer: Self-pay

## 2020-07-25 DIAGNOSIS — T451X5A Adverse effect of antineoplastic and immunosuppressive drugs, initial encounter: Secondary | ICD-10-CM | POA: Diagnosis not present

## 2020-07-25 DIAGNOSIS — G620 Drug-induced polyneuropathy: Secondary | ICD-10-CM | POA: Diagnosis not present

## 2020-07-25 DIAGNOSIS — R5383 Other fatigue: Secondary | ICD-10-CM | POA: Diagnosis not present

## 2020-07-25 DIAGNOSIS — C9001 Multiple myeloma in remission: Secondary | ICD-10-CM | POA: Diagnosis not present

## 2020-07-25 DIAGNOSIS — R7989 Other specified abnormal findings of blood chemistry: Secondary | ICD-10-CM

## 2020-07-25 MED ORDER — GABAPENTIN 300 MG PO CAPS: 600.0000 mg | ORAL_CAPSULE | Freq: Two times a day (BID) | ORAL | 11 refills | Status: DC

## 2020-07-25 MED ORDER — ACYCLOVIR 400 MG PO TABS: 400.0000 mg | ORAL_TABLET | Freq: Every day | ORAL | 6 refills | Status: DC

## 2020-07-25 NOTE — Assessment & Plan Note (Signed)
She tolerated pomalidomide well No recent infection Recent myeloma showed complete response.  She will continue treatment as directed She will continue aspirin for DVT prophylaxis Her recent bone density scan is satisfactory I recommend discontinuation of Zometa from previous visit She will continue calcium with vitamin D and I also recommend acyclovir for antiviral prophylaxis

## 2020-07-25 NOTE — Progress Notes (Signed)
HEMATOLOGY-ONCOLOGY ELECTRONIC VISIT PROGRESS NOTE  Patient Care Team: Lucianne Lei, MD as PCP - General (Family Medicine) Jodi Marble, MD as Consulting Physician (Otolaryngology) Heath Lark, MD as Consulting Physician (Hematology and Oncology)  I connected with by telephone visit ASSESSMENT & PLAN:  Multiple myeloma in remission [facility]) She tolerated pomalidomide well No recent infection Recent myeloma showed complete response.  She will continue treatment as directed She will continue aspirin for DVT prophylaxis Her recent bone density scan is satisfactory I recommend discontinuation of Zometa from previous visit She will continue calcium with vitamin D and I also recommend acyclovir for antiviral prophylaxis  Elevated serum creatinine She has mild intermittent elevated serum creatinine Observe closely Continue risk factor modification  Peripheral neuropathy due to chemotherapy [facility]) She has minimum neuropathy, only at nighttime Gabapentin could contribute to fatigue I recommend gentle taper of the dose of gabapentin to see if she would feel better  Other fatigue She noticed recent increasing fatigue As above, I plan to recommend gentle taper of gabapentin We also discussed the importance of sleep hygiene and setting aside adequate number of hours per day for sleep   No orders of the defined types were placed in this encounter.   INTERVAL HISTORY: Please see below for problem oriented charting. The purpose of today's visit is to review myeloma panel drawn a week ago She tolerated treatment very well without new side effects She has minimum neuropathy from prior treatment She complains of excessive fatigue She has mild intermittent knee pain but that is chronic No recent infection, fever or chills  SUMMARY OF ONCOLOGIC HISTORY: Oncology History  Multiple myeloma in remission ([place])  03/22/2016 Bone Marrow Biopsy   Bone Marrow Biopsy: Plasma cells 17% by Aspirate  and 30% by CD 138 stain. Plasma cell neoplasm, Kappa Restricted Normal cytogenetics, FISH positive for +11 and +14 and +7   04/13/2016 Imaging   Skeletal survey showed lucencies within the calvarium worrisome for myeloma. No definite abnormal lytic or blastic lesions are observed elsewhere.   04/19/2016 - 09/10/2016 Chemotherapy   The patient consented to treatment with Revlimid, dexamethasone and Velcade   09/14/2016 Bone Marrow Biopsy   In summary, there is a normal cellular marrow with trilineage hematopoiesis.A mild plasmacytosis is present, but there is no definitive evidence of residual multiple myeloma   10/12/2016 - 10/12/2016 Chemotherapy   She received melphalan as conditioning chemo   10/13/2016 Bone Marrow Transplant   She received autologous stem cell transplant at [facility]   01/24/2017 Bone Marrow Biopsy   BONE MARROW: -Normocellular marrow for age (50%) with trilineage hematopoiesis. -No morphologic or immunohistochemical evidence of involvement by plasma cell neoplasm (see comment)  PERIPHERAL BLOOD: -Unremarkable (see CBC data)  COMMENT: The bone marrow is normocellular for age and shows adequate trilineage hematopoiesis without significant (<10%) dysplastic changes present. Blasts are not increased. Plasma cells represent about 2% of total cells on aspirate smears. Appropriately controlled immunohistochemical stains are performed on the core biopsy. CD138 highlights small interstitial plasma cells comprising approximately 2% of total cells. These plasma cells appear polytypic as demonstrated by kappa and lambda in-situ hybridization.   01/25/2017 PET scan   No FDG avid osseous lesions or masses are identified.   02/18/2017 - 04/01/2017 Chemotherapy   She received weekly Velcade, Pomalyst and Dexamethasone   04/15/2017 -  Chemotherapy   The patient had Pomalyst only   04/20/2017 Bone Marrow Biopsy   Bone Marrow (BM) and Peripheral Blood  (PB) FINAL PATHOLOGIC DIAGNOSIS BONE  MARROW: Normocellular bone marrow (40%) with normal numbers of megakaryocytes, erythroid hyperplasia and no increase in plasma cells (1%).     REVIEW OF SYSTEMS:   Constitutional: Denies fevers, chills or abnormal weight loss Eyes: Denies blurriness of vision Ears, nose, mouth, throat, and face: Denies mucositis or sore throat Respiratory: Denies cough, dyspnea or wheezes Cardiovascular: Denies palpitation, chest discomfort Gastrointestinal:  Denies nausea, heartburn or change in bowel habits Skin: Denies abnormal skin rashes Lymphatics: Denies new lymphadenopathy or easy bruising Neurological:Denies numbness, tingling or new weaknesses Behavioral/Psych: Mood is stable, no new changes  Extremities: No lower extremity edema All other systems were reviewed with the patient and are negative.  I have reviewed the past medical history, past surgical history, social history and family history with the patient and they are unchanged from previous note.  ALLERGIES:  is allergic to revlimid [lenalidomide].  MEDICATIONS:  Current Outpatient Medications  Medication Sig Dispense Refill  . acyclovir (ZOVIRAX) 400 MG tablet Take 1 tablet (400 mg total) by mouth daily. 90 tablet 6  . acetaminophen (TYLENOL) 500 MG tablet Take 500 mg by mouth every 6 (six) hours as needed.    . ASPIRIN 81 PO Take 81 mg by mouth daily.    . Calcium Carbonate-Vitamin D (CALCIUM PLUS VITAMIN D PO) Take 1 tablet daily by mouth.    . fluticasone (FLONASE) 50 MCG/ACT nasal spray Place into both nostrils daily as needed for allergies or rhinitis.    Marland Kitchen gabapentin (NEURONTIN) 300 MG capsule Take 2 capsules (600 mg total) by mouth 2 (two) times daily. 360 capsule 11  . losartan-hydrochlorothiazide (HYZAAR) 100-25 MG tablet Take 1 tablet by mouth daily. (Patient taking differently: Take 0.5 tablets by mouth daily. TAKES 1/2 TABLET DAILY) 90 tablet 3  . Magnesium Hydroxide (DULCOLAX PO)  Take by mouth as needed.    . Multiple Vitamin (MULTIVITAMIN) tablet Take 1 tablet by mouth daily.    . Polyethylene Glycol 3350 (MIRALAX PO) Take by mouth as needed.    Marland Kitchen POMALYST 2 MG capsule TAKE 1 CAPSULE BY MOUTH ONCE DAILY FOR 21 DAYS ON AND 7 DAYS OFF 21 capsule 0  . rosuvastatin (CRESTOR) 10 MG tablet Take 10 mg by mouth as directed. One tablet before bedtime.     No current facility-administered medications for this visit.    PHYSICAL EXAMINATION: ECOG PERFORMANCE STATUS: 1 - Symptomatic but completely ambulatory  LABORATORY DATA:  I have reviewed the data as listed CMP Latest Ref Rng & Units 07/18/2020 04/18/2020 01/18/2020  Glucose 70 - 99 mg/dL 61(L) 82 80  BUN 8 - 23 mg/dL 16 15 18   Creatinine 0.44 - 1.00 mg/dL 1.04(H) 1.00 1.18(H)  Sodium 135 - 145 mmol/L 137 140 140  Potassium 3.5 - 5.1 mmol/L 3.9 4.2 4.0  Chloride 98 - 111 mmol/L 101 103 102  CO2 22 - 32 mmol/L 28 27 30   Calcium 8.9 - 10.3 mg/dL 9.3 9.7 9.3  Total Protein 6.5 - 8.1 g/dL 7.3 7.5 7.2  Total Bilirubin 0.3 - 1.2 mg/dL 0.6 1.1 0.9  Alkaline Phos 38 - 126 U/L 77 74 86  AST 15 - 41 U/L 24 21 16   ALT 0 - 44 U/L 15 8 8     Lab Results  Component Value Date   WBC 4.4 07/18/2020   HGB 12.4 07/18/2020   HCT 38.5 07/18/2020   MCV 89.7 07/18/2020   PLT 237 07/18/2020   NEUTROABS 2.2 07/18/2020   I discussed the assessment and treatment plan with  the patient. The patient was provided an opportunity to ask questions and all were answered. The patient agreed with the plan and demonstrated an understanding of the instructions. The patient was advised to call back or seek an in-person evaluation if the symptoms worsen or if the condition fails to improve as anticipated.    I spent 20 minutes for the appointment reviewing test results, discuss management and coordination of care.  Heath Lark, MD 07/25/2020 8:44 AM

## 2020-07-25 NOTE — Assessment & Plan Note (Signed)
She noticed recent increasing fatigue As above, I plan to recommend gentle taper of gabapentin We also discussed the importance of sleep hygiene and setting aside adequate number of hours per day for sleep

## 2020-07-25 NOTE — Assessment & Plan Note (Signed)
She has mild intermittent elevated serum creatinine Observe closely Continue risk factor modification

## 2020-07-25 NOTE — Assessment & Plan Note (Signed)
She has minimum neuropathy, only at nighttime Gabapentin could contribute to fatigue I recommend gentle taper of the dose of gabapentin to see if she would feel better

## 2020-07-28 ENCOUNTER — Telehealth: Payer: Self-pay | Admitting: Hematology and Oncology

## 2020-07-28 ENCOUNTER — Other Ambulatory Visit: Payer: Self-pay

## 2020-07-28 MED ORDER — POMALIDOMIDE 2 MG PO CAPS: ORAL_CAPSULE | ORAL | 0 refills | Status: DC

## 2020-07-28 NOTE — Telephone Encounter (Signed)
Checked out appointment. No LOS notes needing to be scheduled. No changes made. 

## 2020-07-28 NOTE — Telephone Encounter (Signed)
Scheduled per 3/11 sch msg. Called and spoke with pt confirmed 6/6 and 6/13 appts

## 2020-07-30 ENCOUNTER — Encounter: Payer: Self-pay | Admitting: Cardiovascular Disease

## 2020-07-30 ENCOUNTER — Other Ambulatory Visit: Payer: Self-pay

## 2020-07-30 ENCOUNTER — Ambulatory Visit: Payer: Medicare PPO | Admitting: Cardiovascular Disease

## 2020-07-30 VITALS — BP 124/76 | HR 48 | Wt 277.0 lb

## 2020-07-30 DIAGNOSIS — I712 Thoracic aortic aneurysm, without rupture, unspecified: Secondary | ICD-10-CM

## 2020-07-30 DIAGNOSIS — R0602 Shortness of breath: Secondary | ICD-10-CM | POA: Diagnosis not present

## 2020-07-30 DIAGNOSIS — M1712 Unilateral primary osteoarthritis, left knee: Secondary | ICD-10-CM | POA: Diagnosis not present

## 2020-07-30 DIAGNOSIS — E782 Mixed hyperlipidemia: Secondary | ICD-10-CM | POA: Diagnosis not present

## 2020-07-30 DIAGNOSIS — M25562 Pain in left knee: Secondary | ICD-10-CM | POA: Diagnosis not present

## 2020-07-30 LAB — BASIC METABOLIC PANEL
BUN/Creatinine Ratio: 17 (ref 12–28)
BUN: 17 mg/dL (ref 8–27)
CO2: 25 mmol/L (ref 20–29)
Calcium: 9.5 mg/dL (ref 8.7–10.3)
Chloride: 97 mmol/L (ref 96–106)
Creatinine, Ser: 1 mg/dL (ref 0.57–1.00)
Glucose: 93 mg/dL (ref 65–99)
Potassium: 4.2 mmol/L (ref 3.5–5.2)
Sodium: 138 mmol/L (ref 134–144)
eGFR: 61 mL/min/{1.73_m2} (ref >59)

## 2020-07-30 NOTE — Progress Notes (Signed)
07/30/2020 Carolyn Newman   1950/08/26  [id number]  Primary Physician Lucianne Lei, MD Primary Cardiologist: Lorretta Harp MD Carolyn Newman, [place]  HPI:  Carolyn Newman is a 70 y.o.  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 She was referred by Dr. Freeman Caldron cardiovascular evaluation because of a recently documented small thoracic aortic aneurysm on chest CT performed 05/18/2018.  I last saw her in the office 07/24/2019.Her risk factors include mild untreated hyperlipidemia. She does not smoke. She is not diabetic. She is never had a heart attack or stroke. There is no family history for heart disease. She denies chest pain or shortness of breath. She does have multiple myeloma diagnosed in 2017 and had a stem cell transplant and is now in remission. Because of an upper respiratory tract infection and coughing she was seen in the emergency room on 05/18/2018 and had a chest CT that showed a small thoracic aortic aneurysm measuring 4 cm x 4 cm.  Since I saw her in the office a year ago she is remained stable.  She apparently is cancer free according to her oncologist.  She has noticed some fatigue and dyspnea on exertion but denies chest pain.  Her last CTA performed 08/15/2019 revealed a stable thoracic aortic aneurysm measuring 40 mm.  Coronary calcium score performed 08/14/2019 was 83 with calcium primarily in the LAD and RCA.  She was started on a statin drug by her PCP because of her elevated LDL and coronary calcification..    Current Meds  Medication Sig  . acetaminophen (TYLENOL) 500 MG tablet Take 500 mg by mouth every 6 (six) hours as needed.  Marland Kitchen acyclovir (ZOVIRAX) 400 MG tablet Take 1 tablet (400 mg total) by mouth daily.  . ASPIRIN 81 PO Take 81 mg by mouth daily.  . Calcium Carbonate-Vitamin D (CALCIUM PLUS VITAMIN D PO) Take 1 tablet daily by mouth.  . fluticasone  (FLONASE) 50 MCG/ACT nasal spray Place into both nostrils daily as needed for allergies or rhinitis.  Marland Kitchen gabapentin (NEURONTIN) 300 MG capsule Take 2 capsules (600 mg total) by mouth 2 (two) times daily.  Marland Kitchen losartan-hydrochlorothiazide (HYZAAR) 100-25 MG tablet Take 1 tablet by mouth daily. (Patient taking differently: Take 0.5 tablets by mouth daily. TAKES 1/2 TABLET DAILY)  . Magnesium Hydroxide (DULCOLAX PO) Take by mouth as needed.  . Multiple Vitamin (MULTIVITAMIN) tablet Take 1 tablet by mouth daily.  . Polyethylene Glycol 3350 (MIRALAX PO) Take by mouth as needed.  . pomalidomide (POMALYST) 2 MG capsule TAKE 1 CAPSULE BY MOUTH ONCE DAILY FOR 21 DAYS ON AND 7 DAYS OFF  . rosuvastatin (CRESTOR) 10 MG tablet Take 10 mg by mouth as directed. One tablet before bedtime.     Allergies  Allergen Reactions  . Revlimid [Lenalidomide] Rash    Social History   Socioeconomic History  . Marital status: Single    Spouse name: Not on file  . Number of children: Not on file  . Years of education: Not on file  . Highest education level: Not on file  Occupational History  . Occupation: Herbalist  Tobacco Use  . Smoking status: Never Smoker  . Smokeless tobacco: Never Used  Vaping Use  . Vaping Use: Never used  Substance and Sexual Activity  . Alcohol use: No  . Drug use: No  . Sexual activity: Not on file  Other Topics Concern  .  Not on file  Social History Narrative   Right handed    2 cups of tea every other day    Lives at home by herself.    Social Determinants of Health   Financial Resource Strain: Not on file  Food Insecurity: Not on file  Transportation Needs: Not on file  Physical Activity: Not on file  Stress: Not on file  Social Connections: Not on file  Intimate Partner Violence: Not on file     Review of Systems: General: negative for chills, fever, night sweats or weight changes.  Cardiovascular: negative for chest pain, dyspnea on exertion, edema,  orthopnea, palpitations, paroxysmal nocturnal dyspnea or shortness of breath Dermatological: negative for rash Respiratory: negative for cough or wheezing Urologic: negative for hematuria Abdominal: negative for nausea, vomiting, diarrhea, bright red blood per rectum, melena, or hematemesis Neurologic: negative for visual changes, syncope, or dizziness All other systems reviewed and are otherwise negative except as noted above.    Blood pressure 124/76, pulse (!) 48, weight 277 lb (125.6 kg).  General appearance: alert and no distress Neck: no adenopathy, no carotid bruit, no JVD, supple, symmetrical, trachea midline and thyroid not enlarged, symmetric, no tenderness/mass/nodules Lungs: clear to auscultation bilaterally Heart: regular rate and rhythm, S1, S2 normal, no murmur, click, rub or gallop Extremities: extremities normal, atraumatic, no cyanosis or edema Pulses: 2+ and symmetric Skin: Skin color, texture, turgor normal. No rashes or lesions Neurologic: Alert and oriented X 3, normal strength and tone. Normal symmetric reflexes. Normal coordination and gait  EKG sinus rhythm at 60 without ST or T wave changes.  Personally reviewed this EKG.  ASSESSMENT AND PLAN:   Thoracic aortic aneurysm without rupture ([place]) Small thoracic aortic aneurysm measuring 4 cm by CTA 08/15/2019.  This will be repeated in the next 1 to 2 months.  The CT scan did show atherosclerosis and at least 2 coronary arteries.  Hyperlipidemia History of hyperlipidemia recently started on Crestor by her PCP who is going to recheck this tomorrow.  Given her mildly elevated coronary calcium score her LDL goal should be less than 70.  Her last lipid profile performed 03/17/2020 revealed a total cholesterol of 208, LDL of 142 and HDL 41.      Lorretta Harp MD FACP,FACC,FAHA, [facility] 07/30/2020 10:08 AM

## 2020-07-30 NOTE — Assessment & Plan Note (Signed)
Small thoracic aortic aneurysm measuring 4 cm by CTA 08/15/2019.  This will be repeated in the next 1 to 2 months.  The CT scan did show atherosclerosis and at least 2 coronary arteries.

## 2020-07-30 NOTE — Patient Instructions (Signed)
Medication Instructions:  Your physician recommends that you continue on your current medications as directed. Please refer to the Current Medication list given to you today.  *If you need a refill on your cardiac medications before your next appointment, please call your pharmacy*   Lab Work: Your physician recommends that you have labs drawn today: BMET  If you have labs (blood work) drawn today and your tests are completely normal, you will receive your results only by: Marland Kitchen MyChart Message (if you have MyChart) OR . A paper copy in the mail If you have any lab test that is abnormal or we need to change your treatment, we will call you to review the results.   Testing/Procedures: Your physician has requested that you have an echocardiogram. Echocardiography is a painless test that uses sound waves to create images of your heart. It provides your doctor with information about the size and shape of your heart and how well your heart's chambers and valves are working. This procedure takes approximately one hour. There are no restrictions for this procedure. This procedure is done at [address]  Non-Cardiac CT Angiography (CTA), is a special type of CT scan that uses a computer to produce multi-dimensional views of major blood vessels throughout the body. In CT angiography, a contrast material is injected through an IV to help visualize the blood vessels.  This procedure is done at [address]    Follow-Up: At [facility], you and your health needs are our priority.  As part of our continuing mission to provide you with exceptional heart care, we have created designated Provider Care Teams.  These Care Teams include your primary Cardiologist (physician) and Advanced Practice Providers (APPs -  Physician Assistants and Nurse Practitioners) who all work together to provide you with the care you need, when you need it.  We recommend signing up for the patient  portal called "MyChart".  Sign up information is provided on this After Visit Summary.  MyChart is used to connect with patients for Virtual Visits (Telemedicine).  Patients are able to view lab/test results, encounter notes, upcoming appointments, etc.  Non-urgent messages can be sent to your provider as well.   To learn more about what you can do with MyChart, go to [email or web address].    Your next appointment:   6 month(s)  The format for your next appointment:   In Person  Provider:   Quay Burow, MD

## 2020-07-30 NOTE — Assessment & Plan Note (Signed)
History of hyperlipidemia recently started on Crestor by her PCP who is going to recheck this tomorrow.  Given her mildly elevated coronary calcium score her LDL goal should be less than 70.  Her last lipid profile performed 03/17/2020 revealed a total cholesterol of 208, LDL of 142 and HDL 41.

## 2020-07-31 DIAGNOSIS — M25561 Pain in right knee: Secondary | ICD-10-CM | POA: Diagnosis not present

## 2020-07-31 DIAGNOSIS — I712 Thoracic aortic aneurysm, without rupture: Secondary | ICD-10-CM | POA: Diagnosis not present

## 2020-07-31 DIAGNOSIS — R7309 Other abnormal glucose: Secondary | ICD-10-CM | POA: Diagnosis not present

## 2020-07-31 DIAGNOSIS — M1711 Unilateral primary osteoarthritis, right knee: Secondary | ICD-10-CM | POA: Diagnosis not present

## 2020-07-31 DIAGNOSIS — C9001 Multiple myeloma in remission: Secondary | ICD-10-CM | POA: Diagnosis not present

## 2020-07-31 DIAGNOSIS — M130 Polyarthritis, unspecified: Secondary | ICD-10-CM | POA: Diagnosis not present

## 2020-07-31 DIAGNOSIS — E785 Hyperlipidemia, unspecified: Secondary | ICD-10-CM | POA: Diagnosis not present

## 2020-07-31 DIAGNOSIS — I10 Essential (primary) hypertension: Secondary | ICD-10-CM | POA: Diagnosis not present

## 2020-07-31 DIAGNOSIS — E782 Mixed hyperlipidemia: Secondary | ICD-10-CM | POA: Diagnosis not present

## 2020-08-06 DIAGNOSIS — M1712 Unilateral primary osteoarthritis, left knee: Secondary | ICD-10-CM | POA: Diagnosis not present

## 2020-08-06 DIAGNOSIS — M25562 Pain in left knee: Secondary | ICD-10-CM | POA: Diagnosis not present

## 2020-08-07 DIAGNOSIS — M25561 Pain in right knee: Secondary | ICD-10-CM | POA: Diagnosis not present

## 2020-08-07 DIAGNOSIS — M1711 Unilateral primary osteoarthritis, right knee: Secondary | ICD-10-CM | POA: Diagnosis not present

## 2020-08-13 DIAGNOSIS — M25562 Pain in left knee: Secondary | ICD-10-CM | POA: Diagnosis not present

## 2020-08-13 DIAGNOSIS — M1712 Unilateral primary osteoarthritis, left knee: Secondary | ICD-10-CM | POA: Diagnosis not present

## 2020-08-14 DIAGNOSIS — M25561 Pain in right knee: Secondary | ICD-10-CM | POA: Diagnosis not present

## 2020-08-14 DIAGNOSIS — M1711 Unilateral primary osteoarthritis, right knee: Secondary | ICD-10-CM | POA: Diagnosis not present

## 2020-08-20 DIAGNOSIS — M25562 Pain in left knee: Secondary | ICD-10-CM | POA: Diagnosis not present

## 2020-08-20 DIAGNOSIS — M1712 Unilateral primary osteoarthritis, left knee: Secondary | ICD-10-CM | POA: Diagnosis not present

## 2020-08-21 ENCOUNTER — Other Ambulatory Visit: Payer: Self-pay | Admitting: Hematology and Oncology

## 2020-08-28 ENCOUNTER — Other Ambulatory Visit (HOSPITAL_COMMUNITY): Payer: Medicare PPO

## 2020-08-28 ENCOUNTER — Inpatient Hospital Stay: Admission: RE | Admit: 2020-08-28 | Payer: Medicare PPO | Source: Ambulatory Visit

## 2020-09-01 ENCOUNTER — Other Ambulatory Visit: Payer: Self-pay

## 2020-09-01 ENCOUNTER — Telehealth: Payer: Self-pay | Admitting: Cardiovascular Disease

## 2020-09-01 DIAGNOSIS — J399 Disease of upper respiratory tract, unspecified: Secondary | ICD-10-CM | POA: Diagnosis not present

## 2020-09-01 DIAGNOSIS — I712 Thoracic aortic aneurysm, without rupture, unspecified: Secondary | ICD-10-CM

## 2020-09-01 DIAGNOSIS — R059 Cough, unspecified: Secondary | ICD-10-CM | POA: Diagnosis not present

## 2020-09-01 DIAGNOSIS — C9001 Multiple myeloma in remission: Secondary | ICD-10-CM | POA: Diagnosis not present

## 2020-09-01 DIAGNOSIS — E782 Mixed hyperlipidemia: Secondary | ICD-10-CM

## 2020-09-01 DIAGNOSIS — I10 Essential (primary) hypertension: Secondary | ICD-10-CM | POA: Diagnosis not present

## 2020-09-01 NOTE — Telephone Encounter (Signed)
Spoke with patient regarding the need for new lab work (labs drawn 07/30/20 will be out of date range)---(pateint rescheduled her CTA chest/aorta from 08/28/20 to 09/11/20 at [place] [address], [address])  Patient to come in either   Monday 09/08/20 or Tuesday 09/09/20 for labs.  She voiced her understanding.

## 2020-09-03 ENCOUNTER — Ambulatory Visit
Admission: EM | Admit: 2020-09-03 | Discharge: 2020-09-03 | Disposition: A | Discharge status: Home / Self Care | Payer: Medicare PPO | Attending: Emergency Medicine | Admitting: Emergency Medicine

## 2020-09-03 ENCOUNTER — Other Ambulatory Visit: Payer: Self-pay

## 2020-09-03 DIAGNOSIS — H1013 Acute atopic conjunctivitis, bilateral: Secondary | ICD-10-CM | POA: Diagnosis not present

## 2020-09-03 MED ORDER — OLOPATADINE HCL 0.1 % OP SOLN: 1.0000 [drp] | Freq: Two times a day (BID) | OPHTHALMIC | 0 refills | Status: DC

## 2020-09-03 NOTE — ED Triage Notes (Signed)
Pt presents with bilateral eye redness with no complaints of itchiness or pain since yesterday.

## 2020-09-03 NOTE — Discharge Instructions (Addendum)
May try olopatadine/Pataday eyedrops twice daily to help with eye redness and any itching that may develop  Cool compresses Follow-up with not improving or worsening

## 2020-09-03 NOTE — ED Provider Notes (Signed)
[name]    CSN: [id number] Arrival date & time: 09/03/20  1446      History   Chief Complaint Chief Complaint  Patient presents with  . Eye Problem    HPI Carolyn Newman is a 70 y.o. female presenting today for evaluation of eye redness.  Denies any associated itchiness or pain.  Noticed eyes red yesterday.  Denies any drainage.  Has had some congestion.  Slightly improving with Coricidin.  Denies any vision changes.  HPI  Past Medical History:  Diagnosis Date  . Alopecia   . Anemia   . Anosmia   . Back pain   . Bilateral knee pain   . Cancer ([place])    myeloma  . Chewing difficulty   . Constipation   . Elevated serum creatinine   . Hyperlipidemia   . Hypertension   . Joint pain   . Leukopenia   . Multiple myeloma ([place])   . Neuropathy    Right and left feet   . Obesity   . SOB (shortness of breath)   . Thoracic aortic aneurysm without rupture [facility])     Patient Active Problem List   Diagnosis Date Noted  . Other fatigue 07/25/2020  . Insulin resistance 04/13/2020  . Visceral obesity 04/13/2020  . Metabolic syndrome 37/90/2409  . Preventive measure 01/25/2020  . Leukopenia due to antineoplastic chemotherapy ([place]) 01/11/2019  . Other constipation 01/11/2019  . Elevated serum creatinine 09/18/2018  . Hyperlipidemia 06/23/2018  . Thoracic aortic aneurysm without rupture ([place]) 05/19/2018  . Anosmia 11/15/2017  . Class 3 severe obesity with serious comorbidity and body mass index (BMI) of 40.0 to 44.9 in adult ([place]) 08/18/2017  . Hypotension due to drugs 04/15/2017  . Hyperglycemia, drug-induced 03/25/2017  . Sinus congestion 03/25/2017  . S/P autologous bone marrow transplantation ([place]) 02/01/2017  . Peripheral neuropathy due to chemotherapy ([place]) 07/13/2016  . Physical debility 07/13/2016  . Allergic rhinitis 07/02/2016  . Rash due to allergy 07/02/2016  . Anemia due to antineoplastic chemotherapy 06/23/2016  . Financial difficulty  05/22/2016  . Pancytopenia, acquired ([place]) 04/19/2016  . Multiple myeloma not having achieved remission ([place]) 04/05/2016  . Multiple myeloma in remission ([place]) 04/04/2016  . Normocytic anemia 03/09/2016    Past Surgical History:  Procedure Laterality Date  . CATARACT EXTRACTION    . EYE SURGERY    . INJECTION KNEE    . LIMBAL STEM CELL TRANSPLANT      OB History    Gravida  0   Para  0   Term  0   Preterm  0   AB  0   Living  0     SAB  0   IAB  0   Ectopic  0   Multiple  0   Live Births  0            Home Medications    Prior to Admission medications   Medication Sig Start Date End Date Taking? Authorizing Provider  olopatadine (PATANOL) 0.1 % ophthalmic solution Place 1 drop into both eyes 2 (two) times daily. 09/03/20  Yes Deuntae Kocsis C, PA-C  acetaminophen (TYLENOL) 500 MG tablet Take 500 mg by mouth every 6 (six) hours as needed.    [provider]  acyclovir (ZOVIRAX) 400 MG tablet Take 1 tablet (400 mg total) by mouth daily. 07/25/20   Heath Lark, MD  ASPIRIN 81 PO Take 81 mg by mouth daily.    [provider]  Calcium Carbonate-Vitamin D (CALCIUM PLUS VITAMIN D PO) Take 1 tablet daily by mouth. 02/16/17   [provider]  fluticasone (FLONASE) 50 MCG/ACT nasal spray Place into both nostrils daily as needed for allergies or rhinitis.    [provider]  gabapentin (NEURONTIN) 300 MG capsule Take 2 capsules (600 mg total) by mouth 2 (two) times daily. 07/25/20   Heath Lark, MD  losartan-hydrochlorothiazide (HYZAAR) 100-25 MG tablet Take 1 tablet by mouth daily. Patient taking differently: Take 0.5 tablets by mouth daily. TAKES 1/2 TABLET DAILY 04/26/16   Heath Lark, MD  Magnesium Hydroxide (DULCOLAX PO) Take by mouth as needed.    [provider]  Multiple Vitamin (MULTIVITAMIN) tablet Take 1 tablet by mouth daily.    [provider]  Polyethylene Glycol 3350 (MIRALAX PO) Take by mouth as  needed.    [provider]  POMALYST 2 MG capsule TAKE 1 CAPSULE BY MOUTH ONCE DAILY FOR 21 DAYS ON AND 7 DAYS OFF 08/21/20   Heath Lark, MD  rosuvastatin (CRESTOR) 10 MG tablet Take 10 mg by mouth as directed. One tablet before bedtime.    [provider]    Family History Family History  Problem Relation Age of Onset  . Cancer Mother        lymphoma  . Diabetes Mother   . Hypertension Mother   . Hyperlipidemia Mother   . Depression Mother   . Obesity Mother   . Cancer Father        prostate ca  . Heart disease Father   . Colon cancer Maternal Grandmother   . Diabetes Maternal Grandfather     Social History Social History   Tobacco Use  . Smoking status: Never Smoker  . Smokeless tobacco: Never Used  Vaping Use  . Vaping Use: Never used  Substance Use Topics  . Alcohol use: No  . Drug use: No     Allergies   Revlimid [lenalidomide]   Review of Systems Review of Systems  Constitutional: Negative for activity change, appetite change, chills, fatigue and fever.  HENT: Negative for congestion, ear pain, rhinorrhea, sinus pressure, sore throat and trouble swallowing.   Eyes: Positive for redness. Negative for photophobia, pain, discharge, itching and visual disturbance.  Respiratory: Negative for cough, chest tightness and shortness of breath.   Cardiovascular: Negative for chest pain.  Gastrointestinal: Negative for abdominal pain, diarrhea, nausea and vomiting.  Musculoskeletal: Negative for myalgias.  Skin: Negative for rash.  Neurological: Negative for dizziness, light-headedness and headaches.     Physical Exam Triage Vital Signs ED Triage Vitals  Enc Vitals Group     BP 09/03/20 1547 124/81     Pulse Rate 09/03/20 1547 (!) 55     Resp 09/03/20 1547 16     Temp 09/03/20 1547 (!) 97.4 F (36.3 C)     Temp Source 09/03/20 1547 Oral     SpO2 09/03/20 1547 97 %     Weight --      Height --      Head Circumference --      Peak Flow --       Pain Score 09/03/20 1545 0     Pain Loc --      Pain Edu? --      Excl. in [place]? --    No data found.  Updated Vital Signs BP 124/81 (BP Location: Left Arm)   Pulse (!) 55   Temp (!) 97.4 F (36.3 C) (Oral)  Resp 16   SpO2 97%   Visual Acuity Right Eye Distance:   Left Eye Distance:   Bilateral Distance:    Right Eye Near:   Left Eye Near:    Bilateral Near:     Physical Exam Vitals and nursing note reviewed.  Constitutional:      Appearance: She is well-developed.     Comments: No acute distress  HENT:     Head: Normocephalic and atraumatic.     Nose: Nose normal.  Eyes:     Conjunctiva/sclera: Conjunctivae normal.     Comments: Bilateral eyes with very mild conjunctival erythema, no discharge, anterior chamber clear, no photophobia with exam  Cardiovascular:     Rate and Rhythm: Normal rate.  Pulmonary:     Effort: Pulmonary effort is normal. No respiratory distress.  Abdominal:     General: There is no distension.  Musculoskeletal:        General: Normal range of motion.     Cervical back: Neck supple.  Skin:    General: Skin is warm and dry.  Neurological:     Mental Status: She is alert and oriented to person, place, and time.      UC Treatments / Results  Labs (all labs ordered are listed, but only abnormal results are displayed) Labs Reviewed - No data to display  EKG   Radiology No results found.  Procedures Procedures (including critical care time)  Medications Ordered in UC Medications - No data to display  Initial Impression / Assessment and Plan / UC Course  I have reviewed the triage vital signs and the nursing notes.  Pertinent labs & imaging results that were available during my care of the patient were reviewed by me and considered in my medical decision making (see chart for details).     Eye exam most consistent with allergic conjunctivitis versus viral conjunctivitis, recommending olopatadine.  Discussed strict  return precautions. Patient verbalized understanding and is agreeable with plan.  Final Clinical Impressions(s) / UC Diagnoses   Final diagnoses:  Allergic conjunctivitis of both eyes     Discharge Instructions     May try olopatadine/Pataday eyedrops twice daily to help with eye redness and any itching that may develop  Cool compresses Follow-up with not improving or worsening    ED Prescriptions    Medication Sig Dispense Auth. Provider   olopatadine (PATANOL) 0.1 % ophthalmic solution Place 1 drop into both eyes 2 (two) times daily. 5 mL Toben Acuna, [place] C, PA-C     PDMP not reviewed this encounter.   Janith Lima, [place] 09/03/20 1629

## 2020-09-07 ENCOUNTER — Encounter: Payer: Self-pay | Admitting: Hematology and Oncology

## 2020-09-08 ENCOUNTER — Other Ambulatory Visit: Payer: Self-pay

## 2020-09-08 DIAGNOSIS — I712 Thoracic aortic aneurysm, without rupture, unspecified: Secondary | ICD-10-CM

## 2020-09-08 LAB — BASIC METABOLIC PANEL
BUN/Creatinine Ratio: 21 (ref 12–28)
BUN: 20 mg/dL (ref 8–27)
CO2: 26 mmol/L (ref 20–29)
Calcium: 9.5 mg/dL (ref 8.7–10.3)
Chloride: 99 mmol/L (ref 96–106)
Creatinine, Ser: 0.94 mg/dL (ref 0.57–1.00)
Glucose: 92 mg/dL (ref 65–99)
Potassium: 4.6 mmol/L (ref 3.5–5.2)
Sodium: 138 mmol/L (ref 134–144)
eGFR: 66 mL/min/{1.73_m2} (ref >59)

## 2020-09-11 ENCOUNTER — Ambulatory Visit (INDEPENDENT_AMBULATORY_CARE_PROVIDER_SITE_OTHER)
Admission: RE | Admit: 2020-09-11 | Discharge: 2020-09-11 | Disposition: A | Discharge status: Home / Self Care | Payer: Medicare PPO | Source: Ambulatory Visit | Attending: Cardiovascular Disease | Admitting: Cardiovascular Disease

## 2020-09-11 ENCOUNTER — Other Ambulatory Visit: Payer: Self-pay

## 2020-09-11 DIAGNOSIS — I712 Thoracic aortic aneurysm, without rupture, unspecified: Secondary | ICD-10-CM

## 2020-09-11 MED ORDER — IOHEXOL 350 MG/ML SOLN
100.0000 mL | Freq: Once | INTRAVENOUS | Status: AC | PRN
  Administered 2020-09-11: 100 mL via INTRAVENOUS

## 2020-09-24 ENCOUNTER — Other Ambulatory Visit: Payer: Self-pay | Admitting: Hematology and Oncology

## 2020-10-02 ENCOUNTER — Other Ambulatory Visit: Payer: Self-pay | Admitting: Hematology and Oncology

## 2020-10-02 ENCOUNTER — Encounter: Payer: Self-pay | Admitting: Hematology and Oncology

## 2020-10-02 NOTE — Telephone Encounter (Signed)
Pls see msg

## 2020-10-08 ENCOUNTER — Other Ambulatory Visit: Payer: Self-pay

## 2020-10-08 ENCOUNTER — Ambulatory Visit (HOSPITAL_COMMUNITY): Payer: Medicare PPO | Attending: Cardiology

## 2020-10-08 DIAGNOSIS — I712 Thoracic aortic aneurysm, without rupture, unspecified: Secondary | ICD-10-CM

## 2020-10-08 DIAGNOSIS — R0602 Shortness of breath: Secondary | ICD-10-CM | POA: Insufficient documentation

## 2020-10-08 DIAGNOSIS — E782 Mixed hyperlipidemia: Secondary | ICD-10-CM | POA: Diagnosis not present

## 2020-10-08 LAB — ECHOCARDIOGRAM COMPLETE
Area-P 1/2: 2.31 cm2
S' Lateral: 3.1 cm

## 2020-10-08 MED ORDER — PERFLUTREN LIPID MICROSPHERE
1.0000 mL | INTRAVENOUS | Status: AC | PRN
  Administered 2020-10-08: 2 mL via INTRAVENOUS

## 2020-10-10 DIAGNOSIS — C9000 Multiple myeloma not having achieved remission: Secondary | ICD-10-CM | POA: Diagnosis not present

## 2020-10-10 DIAGNOSIS — C9001 Multiple myeloma in remission: Secondary | ICD-10-CM | POA: Diagnosis not present

## 2020-10-10 DIAGNOSIS — Z9484 Stem cells transplant status: Secondary | ICD-10-CM | POA: Diagnosis not present

## 2020-10-17 ENCOUNTER — Other Ambulatory Visit: Payer: Self-pay

## 2020-10-17 MED ORDER — POMALIDOMIDE 2 MG PO CAPS: ORAL_CAPSULE | ORAL | 1 refills | Status: DC

## 2020-10-20 ENCOUNTER — Inpatient Hospital Stay: Payer: Medicare PPO | Attending: Hematology and Oncology

## 2020-10-20 ENCOUNTER — Other Ambulatory Visit: Payer: Self-pay

## 2020-10-20 DIAGNOSIS — C9001 Multiple myeloma in remission: Secondary | ICD-10-CM | POA: Diagnosis not present

## 2020-10-20 LAB — CBC WITH DIFFERENTIAL/PLATELET
Abs Immature Granulocytes: 0.02 10*3/uL (ref 0.00–0.07)
Basophils Absolute: 0.1 10*3/uL (ref 0.0–0.1)
Basophils Relative: 3 %
Eosinophils Absolute: 0.2 10*3/uL (ref 0.0–0.5)
Eosinophils Relative: 6 %
HCT: 39.4 % (ref 36.0–46.0)
Hemoglobin: 12.6 g/dL (ref 12.0–15.0)
Immature Granulocytes: 1 %
Lymphocytes Relative: 28 %
Lymphs Abs: 1.1 10*3/uL (ref 0.7–4.0)
MCH: 28.7 pg (ref 26.0–34.0)
MCHC: 32 g/dL (ref 30.0–36.0)
MCV: 89.7 fL (ref 80.0–100.0)
Monocytes Absolute: 0.6 10*3/uL (ref 0.1–1.0)
Monocytes Relative: 14 %
Neutro Abs: 2 10*3/uL (ref 1.7–7.7)
Neutrophils Relative %: 48 %
Platelets: 166 10*3/uL (ref 150–400)
RBC: 4.39 MIL/uL (ref 3.87–5.11)
RDW: 15.9 % — ABNORMAL HIGH (ref 11.5–15.5)
WBC: 4 10*3/uL (ref 4.0–10.5)
nRBC: 0 % (ref 0.0–0.2)

## 2020-10-20 LAB — COMPREHENSIVE METABOLIC PANEL
ALT: 11 U/L (ref 0–44)
AST: 15 U/L (ref 15–41)
Albumin: 3.5 g/dL (ref 3.5–5.0)
Alkaline Phosphatase: 71 U/L (ref 38–126)
Anion gap: 10 (ref 5–15)
BUN: 19 mg/dL (ref 8–23)
CO2: 29 mmol/L (ref 22–32)
Calcium: 9.6 mg/dL (ref 8.9–10.3)
Chloride: 103 mmol/L (ref 98–111)
Creatinine, Ser: 1.01 mg/dL — ABNORMAL HIGH (ref 0.44–1.00)
GFR, Estimated: >60 mL/min (ref >60)
Glucose, Bld: 50 mg/dL — ABNORMAL LOW (ref 70–99)
Potassium: 3.7 mmol/L (ref 3.5–5.1)
Sodium: 142 mmol/L (ref 135–145)
Total Bilirubin: 1 mg/dL (ref 0.3–1.2)
Total Protein: 7.2 g/dL (ref 6.5–8.1)

## 2020-10-21 LAB — KAPPA/LAMBDA LIGHT CHAINS
Kappa free light chain: 41.9 mg/L — ABNORMAL HIGH (ref 3.3–19.4)
Kappa, lambda light chain ratio: 1.52 (ref 0.26–1.65)
Lambda free light chains: 27.6 mg/L — ABNORMAL HIGH (ref 5.7–26.3)

## 2020-10-23 LAB — MULTIPLE MYELOMA PANEL, SERUM
Albumin SerPl Elph-Mcnc: 3.6 g/dL (ref 2.9–4.4)
Albumin/Glob SerPl: 1.3 (ref 0.7–1.7)
Alpha 1: 0.2 g/dL (ref 0.0–0.4)
Alpha2 Glob SerPl Elph-Mcnc: 0.8 g/dL (ref 0.4–1.0)
B-Globulin SerPl Elph-Mcnc: 0.9 g/dL (ref 0.7–1.3)
Gamma Glob SerPl Elph-Mcnc: 1.1 g/dL (ref 0.4–1.8)
Globulin, Total: 3 g/dL (ref 2.2–3.9)
IgA: 218 mg/dL (ref 87–352)
IgG (Immunoglobin G), Serum: 1127 mg/dL (ref 586–1602)
IgM (Immunoglobulin M), Srm: 27 mg/dL (ref 26–217)
Total Protein ELP: 6.6 g/dL (ref 6.0–8.5)

## 2020-10-24 ENCOUNTER — Inpatient Hospital Stay (HOSPITAL_BASED_OUTPATIENT_CLINIC_OR_DEPARTMENT_OTHER): Payer: Medicare PPO | Admitting: Hematology and Oncology

## 2020-10-24 ENCOUNTER — Other Ambulatory Visit: Payer: Self-pay

## 2020-10-24 ENCOUNTER — Encounter: Payer: Self-pay | Admitting: Hematology and Oncology

## 2020-10-24 ENCOUNTER — Telehealth: Payer: Self-pay

## 2020-10-24 DIAGNOSIS — R7989 Other specified abnormal findings of blood chemistry: Secondary | ICD-10-CM

## 2020-10-24 DIAGNOSIS — Z6841 Body Mass Index (BMI) 40.0 and over, adult: Secondary | ICD-10-CM | POA: Diagnosis not present

## 2020-10-24 DIAGNOSIS — C9001 Multiple myeloma in remission: Secondary | ICD-10-CM | POA: Diagnosis not present

## 2020-10-24 MED ORDER — GABAPENTIN 300 MG PO CAPS: 300.0000 mg | ORAL_CAPSULE | Freq: Every day | ORAL | 11 refills | Status: DC

## 2020-10-24 NOTE — Progress Notes (Signed)
HEMATOLOGY-ONCOLOGY ELECTRONIC VISIT PROGRESS NOTE  Patient Care Team: Lucianne Lei, MD as PCP - General (Family Medicine) Jodi Marble, MD as Consulting Physician (Otolaryngology) Heath Lark, MD as Consulting Physician (Hematology and Oncology)  I connected with the patient via telephone visit to review test results ASSESSMENT & PLAN:  Multiple myeloma in remission [facility]) She tolerated pomalidomide well No recent infection Recent myeloma showed complete response.  She will continue treatment as directed She will continue aspirin for DVT prophylaxis Her recent bone density scan is satisfactory; we have discontinued Zometa from previous visit She will continue calcium with vitamin D and I also recommend acyclovir for antiviral prophylaxis  Elevated serum creatinine She has mild intermittent elevated serum creatinine Observe closely Continue risk factor modification and recommend her to increase oral fluids as much as possible  Class 3 severe obesity with serious comorbidity and body mass index (BMI) of 40.0 to 44.9 in adult [facility]) She is highly motivated She is doing weight watchers program and exercising through [employer] program She have lost some weight I encouraged the patient to continue her best effort  No orders of the defined types were placed in this encounter.   INTERVAL HISTORY: Please see below for problem oriented charting. She returns for further follow-up She is doing well No recent infection She has completed myeloma follow-up at [facility] recently No new bone pain She has lost some weight She has no new side effects from treatment  SUMMARY OF ONCOLOGIC HISTORY: Oncology History  Multiple myeloma in remission ([place])  03/22/2016 Bone Marrow Biopsy   Bone Marrow Biopsy: Plasma cells 17% by Aspirate and 30% by CD 138 stain. Plasma cell neoplasm, Kappa Restricted Normal cytogenetics, FISH positive for +11 and +14 and +7    04/13/2016 Imaging    Skeletal survey showed lucencies within the calvarium worrisome for myeloma. No definite abnormal lytic or blastic lesions are observed elsewhere.    04/19/2016 - 09/10/2016 Chemotherapy   The patient consented to treatment with Revlimid, dexamethasone and Velcade    09/14/2016 Bone Marrow Biopsy   In summary, there is a normal cellular marrow with trilineage hematopoiesis.  A mild plasmacytosis is present, but there is no definitive evidence of residual multiple myeloma    10/12/2016 - 10/12/2016 Chemotherapy   She received melphalan as conditioning chemo    10/13/2016 Bone Marrow Transplant   She received autologous stem cell transplant at [facility]    01/24/2017 Bone Marrow Biopsy   BONE MARROW: -          Normocellular marrow for age (50%) with trilineage hematopoiesis. -     No morphologic or immunohistochemical evidence of involvement by plasma cell neoplasm (see comment)  PERIPHERAL BLOOD: -          Unremarkable (see CBC data)  COMMENT: The bone marrow is normocellular for age and shows adequate trilineage hematopoiesis without significant (<10%) dysplastic changes present. Blasts are not increased. Plasma cells represent about 2% of total cells on aspirate smears. Appropriately controlled immunohistochemical stains are performed on the core biopsy. CD138 highlights small interstitial plasma cells comprising approximately 2% of total cells. These plasma cells appear polytypic as demonstrated by kappa and lambda in-situ hybridization.    01/25/2017 PET scan   No FDG avid osseous lesions or masses are identified.    02/18/2017 - 04/01/2017 Chemotherapy   She received weekly Velcade, Pomalyst and Dexamethasone    04/15/2017 -  Chemotherapy   The patient had Pomalyst only    04/20/2017  Bone Marrow Biopsy   Bone Marrow (BM) and Peripheral Blood (PB) FINAL PATHOLOGIC DIAGNOSIS BONE MARROW: Normocellular bone marrow (40%) with normal numbers of megakaryocytes, erythroid  hyperplasia and no increase in plasma cells (1%).      REVIEW OF SYSTEMS:   Constitutional: Denies fevers, chills or abnormal weight loss Eyes: Denies blurriness of vision Ears, nose, mouth, throat, and face: Denies mucositis or sore throat Respiratory: Denies cough, dyspnea or wheezes Cardiovascular: Denies palpitation, chest discomfort Gastrointestinal:  Denies nausea, heartburn or change in bowel habits Skin: Denies abnormal skin rashes Lymphatics: Denies new lymphadenopathy or easy bruising Neurological:Denies numbness, tingling or new weaknesses Behavioral/Psych: Mood is stable, no new changes  Extremities: No lower extremity edema All other systems were reviewed with the patient and are negative.  I have reviewed the past medical history, past surgical history, social history and family history with the patient and they are unchanged from previous note.  ALLERGIES:  is allergic to revlimid [lenalidomide].  MEDICATIONS:  Current Outpatient Medications  Medication Sig Dispense Refill   acetaminophen (TYLENOL) 500 MG tablet Take 500 mg by mouth every 6 (six) hours as needed.     acyclovir (ZOVIRAX) 400 MG tablet Take 1 tablet (400 mg total) by mouth daily. 90 tablet 6   ASPIRIN 81 PO Take 81 mg by mouth daily.     Calcium Carbonate-Vitamin D (CALCIUM PLUS VITAMIN D PO) Take 1 tablet daily by mouth.     fluticasone (FLONASE) 50 MCG/ACT nasal spray Place into both nostrils daily as needed for allergies or rhinitis.     gabapentin (NEURONTIN) 300 MG capsule Take 1 capsule (300 mg total) by mouth daily. 360 capsule 11   losartan-hydrochlorothiazide (HYZAAR) 100-25 MG tablet Take 1 tablet by mouth daily. (Patient taking differently: Take 0.5 tablets by mouth daily. TAKES 1/2 TABLET DAILY) 90 tablet 3   Magnesium Hydroxide (DULCOLAX PO) Take by mouth as needed.     Multiple Vitamin (MULTIVITAMIN) tablet Take 1 tablet by mouth daily.     olopatadine (PATANOL) 0.1 % ophthalmic solution  Place 1 drop into both eyes 2 (two) times daily. 5 mL 0   Polyethylene Glycol 3350 (MIRALAX PO) Take by mouth as needed.     pomalidomide (POMALYST) 2 MG capsule TAKE 1 CAPSULE BY MOUTH ONCE DAILY FOR 21 DAYS ON AND 7 DAYS OFF 21 capsule 1   rosuvastatin (CRESTOR) 10 MG tablet Take 10 mg by mouth as directed. One tablet before bedtime.     No current facility-administered medications for this visit.    PHYSICAL EXAMINATION: ECOG PERFORMANCE STATUS: 1 - Symptomatic but completely ambulatory  LABORATORY DATA:  I have reviewed the data as listed CMP Latest Ref Rng & Units 10/20/2020 09/08/2020 07/30/2020  Glucose 70 - 99 mg/dL 50(L) 92 93  BUN 8 - 23 mg/dL 19 20 17   Creatinine 0.44 - 1.00 mg/dL 1.01(H) 0.94 1.00  Sodium 135 - 145 mmol/L 142 138 138  Potassium 3.5 - 5.1 mmol/L 3.7 4.6 4.2  Chloride 98 - 111 mmol/L 103 99 97  CO2 22 - 32 mmol/L 29 26 25   Calcium 8.9 - 10.3 mg/dL 9.6 9.5 9.5  Total Protein 6.5 - 8.1 g/dL 7.2 - -  Total Bilirubin 0.3 - 1.2 mg/dL 1.0 - -  Alkaline Phos 38 - 126 U/L 71 - -  AST 15 - 41 U/L 15 - -  ALT 0 - 44 U/L 11 - -    Lab Results  Component Value Date   WBC  4.0 10/20/2020   HGB 12.6 10/20/2020   HCT 39.4 10/20/2020   MCV 89.7 10/20/2020   PLT 166 10/20/2020   NEUTROABS 2.0 10/20/2020     RADIOGRAPHIC STUDIES: I have personally reviewed the radiological images as listed and agreed with the findings in the report. ECHOCARDIOGRAM COMPLETE  Result Date: 10/08/2020    ECHOCARDIOGRAM REPORT   Patient Name:   JORDYNN MARCELLA Date of Exam: 10/08/2020 Medical Rec #:  [id number]       Height:       68.0 in Accession #:    [id number]      Weight:       277.0 lb Date of Birth:  10/15/1950        BSA:          2.347 m Patient Age:    22 years        BP:           172/78 mmHg Patient Gender: F               HR:           51 bpm. Exam Location:  [place] Procedure: 2D Echo, Cardiac Doppler and Color Doppler Indications:    R06.02 SOB  History:        Patient  has prior history of Echocardiogram examinations, most                 recent 06/30/2018. Thoracic aneurysm without rupture; Risk                 Factors:HLD.  Sonographer:    Marygrace Drought RCS Referring Phys: [place]  1. Left ventricular ejection fraction, by estimation, is 60 to 65%. The left ventricle has normal function. The left ventricle has no regional wall motion abnormalities. Left ventricular diastolic parameters are consistent with Grade I diastolic dysfunction (impaired relaxation).  2. Right ventricular systolic function is normal. The right ventricular size is normal.  3. The mitral valve is normal in structure. No evidence of mitral valve regurgitation. No evidence of mitral stenosis.  4. The aortic valve is normal in structure. Aortic valve regurgitation is not visualized. No aortic stenosis is present.  5. There is borderline dilatation of the aortic root, measuring 38 mm.  6. The inferior vena cava is normal in size with greater than 50% respiratory variability, suggesting right atrial pressure of 3 mmHg. FINDINGS  Left Ventricle: Left ventricular ejection fraction, by estimation, is 60 to 65%. The left ventricle has normal function. The left ventricle has no regional wall motion abnormalities. The left ventricular internal cavity size was normal in size. There is  no left ventricular hypertrophy. Left ventricular diastolic parameters are consistent with Grade I diastolic dysfunction (impaired relaxation). Right Ventricle: The right ventricular size is normal. No increase in right ventricular wall thickness. Right ventricular systolic function is normal. Left Atrium: Left atrial size was normal in size. Right Atrium: Right atrial size was normal in size. Pericardium: There is no evidence of pericardial effusion. Mitral Valve: The mitral valve is normal in structure. No evidence of mitral valve regurgitation. No evidence of mitral valve stenosis. Tricuspid Valve: The  tricuspid valve is normal in structure. Tricuspid valve regurgitation is not demonstrated. No evidence of tricuspid stenosis. Aortic Valve: The aortic valve is normal in structure. Aortic valve regurgitation is not visualized. No aortic stenosis is present. Pulmonic Valve: The pulmonic valve was normal in structure. Pulmonic valve regurgitation is not visualized.  No evidence of pulmonic stenosis. Aorta: The aortic root is normal in size and structure. There is borderline dilatation of the aortic root, measuring 38 mm. Venous: The inferior vena cava is normal in size with greater than 50% respiratory variability, suggesting right atrial pressure of 3 mmHg. IAS/Shunts: No atrial level shunt detected by color flow Doppler.  LEFT VENTRICLE PLAX 2D LVIDd:         4.60 cm  Diastology LVIDs:         3.10 cm  LV e' medial:    7.40 cm/s LV PW:         1.10 cm  LV E/e' medial:  7.1 LV IVS:        0.90 cm  LV e' lateral:   10.30 cm/s LVOT diam:     2.10 cm  LV E/e' lateral: 5.1 LV SV:         70 LV SV Index:   30 LVOT Area:     3.46 cm  RIGHT VENTRICLE RV Basal diam:  3.50 cm RV S prime:     13.40 cm/s TAPSE (M-mode): 2.1 cm LEFT ATRIUM             Index       RIGHT ATRIUM           Index LA diam:        3.00 cm 1.28 cm/m  RA Area:     13.40 cm LA Vol (A2C):   35.4 ml 15.08 ml/m RA Volume:   33.20 ml  14.14 ml/m LA Vol (A4C):   66.5 ml 28.33 ml/m LA Biplane Vol: 49.2 ml 20.96 ml/m  AORTIC VALVE LVOT Vmax:   86.80 cm/s LVOT Vmean:  56.500 cm/s LVOT VTI:    0.202 m  AORTA Ao Root diam: 3.80 cm Ao Asc diam:  3.70 cm MITRAL VALVE MV Area (PHT):             SHUNTS MV Decel Time:             Systemic VTI:  0.20 m MV E velocity: 52.70 cm/s  Systemic Diam: 2.10 cm MV A velocity: 69.80 cm/s MV E/A ratio:  0.76 Candee Furbish MD Electronically signed by Candee Furbish MD Signature Date/Time: 10/08/2020/12:46:10 PM    Final     I discussed the assessment and treatment plan with the patient. The patient was provided an opportunity to  ask questions and all were answered. The patient agreed with the plan and demonstrated an understanding of the instructions. The patient was advised to call back or seek an in-person evaluation if the symptoms worsen or if the condition fails to improve as anticipated.    I spent 20 minutes for the appointment reviewing test results, discuss management and coordination of care.  Heath Lark, MD 10/24/2020 2:29 PM

## 2020-10-24 NOTE — Assessment & Plan Note (Signed)
She tolerated pomalidomide well No recent infection Recent myeloma showed complete response.  She will continue treatment as directed She will continue aspirin for DVT prophylaxis Her recent bone density scan is satisfactory; we have discontinued Zometa from previous visit She will continue calcium with vitamin D and I also recommend acyclovir for antiviral prophylaxis

## 2020-10-24 NOTE — Assessment & Plan Note (Signed)
She has mild intermittent elevated serum creatinine Observe closely Continue risk factor modification and recommend her to increase oral fluids as much as possible

## 2020-10-24 NOTE — Telephone Encounter (Signed)
Called and moved appt to 2:20 pm today. She is aware of appt time.

## 2020-10-24 NOTE — Assessment & Plan Note (Signed)
She is highly motivated She is doing weight watchers program and exercising through [employer] program She have lost some weight I encouraged the patient to continue her best effort

## 2020-10-27 ENCOUNTER — Telehealth: Payer: Self-pay | Admitting: Hematology and Oncology

## 2020-10-27 ENCOUNTER — Telehealth: Payer: Medicare PPO | Admitting: Hematology and Oncology

## 2020-10-27 NOTE — Telephone Encounter (Signed)
Scheduled per 6/9 sch msg. Called and spoke with pt, confirmed 9/9 and 9/16 appts

## 2020-10-31 DIAGNOSIS — I10 Essential (primary) hypertension: Secondary | ICD-10-CM | POA: Diagnosis not present

## 2020-10-31 DIAGNOSIS — E785 Hyperlipidemia, unspecified: Secondary | ICD-10-CM | POA: Diagnosis not present

## 2020-11-18 ENCOUNTER — Other Ambulatory Visit: Payer: Self-pay | Admitting: Hematology and Oncology

## 2020-11-18 DIAGNOSIS — Z800 Family history of malignant neoplasm of digestive organs: Secondary | ICD-10-CM | POA: Diagnosis not present

## 2020-11-18 DIAGNOSIS — R140 Abdominal distension (gaseous): Secondary | ICD-10-CM | POA: Diagnosis not present

## 2020-11-18 DIAGNOSIS — R194 Change in bowel habit: Secondary | ICD-10-CM | POA: Diagnosis not present

## 2020-11-21 DIAGNOSIS — M25562 Pain in left knee: Secondary | ICD-10-CM | POA: Diagnosis not present

## 2020-11-21 DIAGNOSIS — M170 Bilateral primary osteoarthritis of knee: Secondary | ICD-10-CM | POA: Diagnosis not present

## 2020-11-21 DIAGNOSIS — M25561 Pain in right knee: Secondary | ICD-10-CM | POA: Diagnosis not present

## 2020-12-08 ENCOUNTER — Other Ambulatory Visit: Payer: Self-pay

## 2020-12-08 MED ORDER — POMALIDOMIDE 2 MG PO CAPS: ORAL_CAPSULE | ORAL | 1 refills | Status: DC

## 2021-01-08 ENCOUNTER — Other Ambulatory Visit: Payer: Self-pay | Admitting: Hematology and Oncology

## 2021-01-08 NOTE — Telephone Encounter (Signed)
Pls refill electronically °

## 2021-01-21 ENCOUNTER — Other Ambulatory Visit: Payer: Self-pay

## 2021-01-21 ENCOUNTER — Ambulatory Visit: Payer: Medicare PPO | Admitting: Cardiovascular Disease

## 2021-01-21 ENCOUNTER — Encounter: Payer: Self-pay | Admitting: Cardiovascular Disease

## 2021-01-21 VITALS — BP 110/80 | HR 56 | Resp 20 | Ht 69.0 in | Wt 270.2 lb

## 2021-01-21 DIAGNOSIS — I712 Thoracic aortic aneurysm, without rupture, unspecified: Secondary | ICD-10-CM

## 2021-01-21 DIAGNOSIS — I952 Hypotension due to drugs: Secondary | ICD-10-CM | POA: Diagnosis not present

## 2021-01-21 DIAGNOSIS — E782 Mixed hyperlipidemia: Secondary | ICD-10-CM | POA: Diagnosis not present

## 2021-01-21 NOTE — Assessment & Plan Note (Signed)
History of small thoracic aortic aneurysm measuring between 38 and 40 mm by 2D echo and CTA performed in April and May of this year.  We will repeat a chest CTA in May of next year to follow this.

## 2021-01-21 NOTE — Progress Notes (Signed)
01/21/2021 Carolyn Newman   [birth year]  [id number]  Primary Physician Lucianne Lei, MD Primary Cardiologist: Lorretta Harp MD Lupe Carney, [place]  HPI:  Carolyn Newman is a 70 y.o.  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  She was referred by Dr. Alvy Bimler for cardiovascular evaluation because of a recently documented small thoracic aortic aneurysm on chest CT performed 05/18/2018.  I last saw her in the office 07/30/2020. Her risk factors include mild untreated hyperlipidemia.  She does not smoke.  She is not diabetic.  She is never had a heart attack or stroke.  There is no family history for heart disease.  She denies chest pain or shortness of breath.  She does have multiple myeloma diagnosed in 2017 and had a stem cell transplant and is now in remission.  Because of an upper respiratory tract infection and coughing she was seen in the emergency room on 05/18/2018 and had a chest CT that showed a small thoracic aortic aneurysm measuring 4 cm x 4 cm.   She apparently is cancer free according to her oncologist.  She has noticed some fatigue and dyspnea on exertion but denies chest pain.  Her last CTA performed 08/15/2019 revealed a stable thoracic aortic aneurysm measuring 40 mm.  Coronary calcium score performed 08/14/2019 was 83 with calcium primarily in the LAD and RCA.  She was started on a statin drug by her PCP because of her elevated LDL and coronary calcification.  Since I saw her 6 months ago she continues to do well.  She denies chest pain or shortness of breath.  Chest CTA performed in the evaluation of her small thoracic aortic aneurysm 09/12/2020 revealed this to be 40 mm in maximum caliber.   Current Meds  Medication Sig   acetaminophen (TYLENOL) 500 MG tablet Take 500 mg by mouth every 6 (six) hours as needed.   acyclovir (ZOVIRAX) 400 MG tablet Take 1 tablet (400 mg total) by  mouth daily.   ASPIRIN 81 PO Take 81 mg by mouth daily.   Calcium Carbonate-Vitamin D (CALCIUM PLUS VITAMIN D PO) Take 1 tablet daily by mouth.   fluticasone (FLONASE) 50 MCG/ACT nasal spray Place into both nostrils daily as needed for allergies or rhinitis.   gabapentin (NEURONTIN) 300 MG capsule Take 1 capsule (300 mg total) by mouth daily.   losartan-hydrochlorothiazide (HYZAAR) 100-25 MG tablet Take 1 tablet by mouth daily. (Patient taking differently: Take 0.5 tablets by mouth daily. TAKES 1/2 TABLET DAILY)   Magnesium Hydroxide (DULCOLAX PO) Take by mouth as needed.   Multiple Vitamin (MULTIVITAMIN) tablet Take 1 tablet by mouth daily.   Polyethylene Glycol 3350 (MIRALAX PO) Take by mouth as needed.   POMALYST 2 MG capsule TAKE 1 CAPSULE BY MOUTH ONCE DAILY FOR 21 DAYS ON AND 7 DAYS OFF   rosuvastatin (CRESTOR) 10 MG tablet Take 10 mg by mouth as directed. One tablet before bedtime.     Allergies  Allergen Reactions   Revlimid [Lenalidomide] Rash    Social History   Socioeconomic History   Marital status: Single    Spouse name: Not on file   Number of children: Not on file   Years of education: Not on file   Highest education level: Not on file  Occupational History   Occupation: Herbalist  Tobacco Use   Smoking status: Never   Smokeless tobacco: Never  Vaping  Use   Vaping Use: Never used  Substance and Sexual Activity   Alcohol use: No   Drug use: No   Sexual activity: Not on file  Other Topics Concern   Not on file  Social History Narrative   Right handed    2 cups of tea every other day    Lives at home by herself.    Social Determinants of Health   Financial Resource Strain: Not on file  Food Insecurity: Not on file  Transportation Needs: Not on file  Physical Activity: Not on file  Stress: Not on file  Social Connections: Not on file  Intimate Partner Violence: Not on file     Review of Systems: General: negative for chills, fever, night  sweats or weight changes.  Cardiovascular: negative for chest pain, dyspnea on exertion, edema, orthopnea, palpitations, paroxysmal nocturnal dyspnea or shortness of breath Dermatological: negative for rash Respiratory: negative for cough or wheezing Urologic: negative for hematuria Abdominal: negative for nausea, vomiting, diarrhea, bright red blood per rectum, melena, or hematemesis Neurologic: negative for visual changes, syncope, or dizziness All other systems reviewed and are otherwise negative except as noted above.    Blood pressure 110/80, pulse (!) 56, resp. rate 20, height _0  (1.753 m), weight 270 lb 3.2 oz (122.6 kg), SpO2 97 %.  General appearance: alert and no distress Neck: no adenopathy, no carotid bruit, no JVD, supple, symmetrical, trachea midline, and thyroid not enlarged, symmetric, no tenderness/mass/nodules Lungs: clear to auscultation bilaterally Heart: regular rate and rhythm, S1, S2 normal, no murmur, click, rub or gallop Extremities: extremities normal, atraumatic, no cyanosis or edema Pulses: 2+ and symmetric Skin: Skin color, texture, turgor normal. No rashes or lesions Neurologic: Grossly normal  EKG sinus bradycardia 56 with nonspecific ST and T wave changes.  I personally reviewed this EKG.  ASSESSMENT AND PLAN:   Thoracic aortic aneurysm without rupture ([place]) History of small thoracic aortic aneurysm measuring between 38 and 40 mm by 2D echo and CTA performed in April and May of this year.  We will repeat a chest CTA in May of next year to follow this.  Hyperlipidemia History of mild hyperlipidemia recently started on statin therapy.  She did have a coronary calcium score of 83 with calcium principally in the LAD and RCA performed 08/14/2019 although she is asymptomatic.  LDL target based on this is less than 70.  I suspect she should have her rosuvastatin increased from 10 to 20 mg a day by her PCP who follows this.     Lorretta Harp MD  FACP,FACC,FAHA, [facility] 01/21/2021 10:42 AM

## 2021-01-21 NOTE — Patient Instructions (Addendum)
Medication Instructions:  NO CHANGES *If you need a refill on your cardiac medications before your next appointment, please call your pharmacy*   Lab Work: NONE If you have labs (blood work) drawn today and your tests are completely normal, you will receive your results only by: [place] (if you have MyChart) OR A paper copy in the mail If you have any lab test that is abnormal or we need to change your treatment, we will call you to review the results.   Testing/Procedures:   Your cardiac CT will be scheduled at one of the below locations:  [facility] [place], [place] [address] [phone]     If scheduled at [facility], please arrive at the [facility] main entrance (entrance A) of [facility] 30 minutes prior to test start time. Proceed to the [facility] Radiology Department (first floor) to check-in and test prep.  If scheduled at [facility], please arrive 15 mins early for check-in and test prep.  Please follow these instructions carefully (unless otherwise directed):  Hold all erectile dysfunction medications at least 3 days (72 hrs) prior to test.  On the Night Before the Test: Be sure to Drink plenty of water. Do not consume any caffeinated/decaffeinated beverages or chocolate 12 hours prior to your test. Do not take any antihistamines 12 hours prior to your test. On the Day of the Test: Drink plenty of water until 1 hour prior to the test. Do not eat any food 4 hours prior to the test. You may take your regular medications prior to the test.  Take metoprolol (Lopressor) two hours prior to test. HOLD Furosemide/Hydrochlorothiazide morning of the test. FEMALES- please wear underwire-free bra if available, avoid dresses & tight clothing   *For Clinical Staff only. Please instruct patient the following:* Heart Rate Medication Recommendations for Cardiac CT  Resting  HR < 50 bpm  No medication  Resting HR 50-60 bpm and BP >110/50 mmHG   Consider Metoprolol tartrate 25 mg PO 90-120 min prior to scan  Resting HR 60-65 bpm and BP >110/50 mmHG  Metoprolol tartrate 50 mg PO 90-120 minutes prior to scan   Resting HR > 65 bpm and BP >110/50 mmHG  Metoprolol tartrate 100 mg PO 90-120 minutes prior to scan  Consider Ivabradine 10-15 mg PO or a calcium channel blocker for resting HR >60 bpm and contraindication to metoprolol tartrate  Consider Ivabradine 10-15 mg PO in combination with metoprolol tartrate for HR >80 bpm         After the Test: Drink plenty of water. After receiving IV contrast, you may experience a mild flushed feeling. This is normal. On occasion, you may experience a mild rash up to 24 hours after the test. This is not dangerous. If this occurs, you can take Benadryl 25 mg and increase your fluid intake. If you experience trouble breathing, this can be serious. If it is severe call 911 IMMEDIATELY. If it is mild, please call our office. If you take any of these medications: Glipizide/Metformin, Avandament, Glucavance, please do not take 48 hours after completing test unless otherwise instructed.  Please allow 2-4 weeks for scheduling of routine cardiac CTs. Some insurance companies require a pre-authorization which may delay scheduling of this test.   For non-scheduling related questions, please contact the cardiac imaging nurse navigator should you have any questions/concerns: Marchia Bond, Cardiac Imaging Nurse Navigator Gordy Clement, Cardiac Imaging Nurse Navigator  [facility] Direct Office Dial: [phone]   For scheduling needs, including cancellations and rescheduling, please call [place], [phone].    Follow-Up: At [facility], you and your health needs are our priority.  As part of our continuing mission to provide you with exceptional heart care, we have created designated Provider Care  Teams.  These Care Teams include your primary Cardiologist (physician) and Advanced Practice Providers (APPs -  Physician Assistants and Nurse Practitioners) who all work together to provide you with the care you need, when you need it.  We recommend signing up for the patient portal called "MyChart".  Sign up information is provided on this After Visit Summary.  MyChart is used to connect with patients for Virtual Visits (Telemedicine).  Patients are able to view lab/test results, encounter notes, upcoming appointments, etc.  Non-urgent messages can be sent to your provider as well.   To learn more about what you can do with MyChart, go to [email or web address].    Your next appointment:   YEAR   The format for your next appointment:   In Person  Provider:      Other Instructions NONE

## 2021-01-21 NOTE — Assessment & Plan Note (Signed)
History of mild hyperlipidemia recently started on statin therapy.  She did have a coronary calcium score of 83 with calcium principally in the LAD and RCA performed 08/14/2019 although she is asymptomatic.  LDL target based on this is less than 70.  I suspect she should have her rosuvastatin increased from 10 to 20 mg a day by her PCP who follows this.

## 2021-01-22 ENCOUNTER — Encounter: Payer: Self-pay | Admitting: Hematology and Oncology

## 2021-01-22 ENCOUNTER — Other Ambulatory Visit (HOSPITAL_COMMUNITY): Payer: Self-pay | Admitting: Cardiovascular Disease

## 2021-01-22 ENCOUNTER — Other Ambulatory Visit: Payer: Self-pay | Admitting: Cardiovascular Disease

## 2021-01-22 DIAGNOSIS — I712 Thoracic aortic aneurysm, without rupture, unspecified: Secondary | ICD-10-CM

## 2021-01-23 ENCOUNTER — Inpatient Hospital Stay: Payer: Medicare PPO | Attending: Hematology and Oncology

## 2021-01-23 ENCOUNTER — Encounter: Payer: Self-pay | Admitting: Hematology and Oncology

## 2021-01-23 ENCOUNTER — Other Ambulatory Visit: Payer: Self-pay

## 2021-01-23 DIAGNOSIS — C9001 Multiple myeloma in remission: Secondary | ICD-10-CM | POA: Diagnosis not present

## 2021-01-23 LAB — CBC WITH DIFFERENTIAL/PLATELET
Abs Immature Granulocytes: 0.01 10*3/uL (ref 0.00–0.07)
Basophils Absolute: 0.1 10*3/uL (ref 0.0–0.1)
Basophils Relative: 3 %
Eosinophils Absolute: 0.2 10*3/uL (ref 0.0–0.5)
Eosinophils Relative: 5 %
HCT: 40.6 % (ref 36.0–46.0)
Hemoglobin: 13.1 g/dL (ref 12.0–15.0)
Immature Granulocytes: 0 %
Lymphocytes Relative: 32 %
Lymphs Abs: 1.3 10*3/uL (ref 0.7–4.0)
MCH: 28.9 pg (ref 26.0–34.0)
MCHC: 32.3 g/dL (ref 30.0–36.0)
MCV: 89.4 fL (ref 80.0–100.0)
Monocytes Absolute: 0.4 10*3/uL (ref 0.1–1.0)
Monocytes Relative: 9 %
Neutro Abs: 2 10*3/uL (ref 1.7–7.7)
Neutrophils Relative %: 51 %
Platelets: 223 10*3/uL (ref 150–400)
RBC: 4.54 MIL/uL (ref 3.87–5.11)
RDW: 14.9 % (ref 11.5–15.5)
WBC: 4 10*3/uL (ref 4.0–10.5)
nRBC: 0 % (ref 0.0–0.2)

## 2021-01-23 LAB — COMPREHENSIVE METABOLIC PANEL
ALT: 11 U/L (ref 0–44)
AST: 16 U/L (ref 15–41)
Albumin: 3.6 g/dL (ref 3.5–5.0)
Alkaline Phosphatase: 83 U/L (ref 38–126)
Anion gap: 10 (ref 5–15)
BUN: 18 mg/dL (ref 8–23)
CO2: 26 mmol/L (ref 22–32)
Calcium: 9.7 mg/dL (ref 8.9–10.3)
Chloride: 104 mmol/L (ref 98–111)
Creatinine, Ser: 0.98 mg/dL (ref 0.44–1.00)
GFR, Estimated: >60 mL/min (ref >60)
Glucose, Bld: 89 mg/dL (ref 70–99)
Potassium: 3.9 mmol/L (ref 3.5–5.1)
Sodium: 140 mmol/L (ref 135–145)
Total Bilirubin: 0.7 mg/dL (ref 0.3–1.2)
Total Protein: 7 g/dL (ref 6.5–8.1)

## 2021-01-26 LAB — KAPPA/LAMBDA LIGHT CHAINS
Kappa free light chain: 29.6 mg/L — ABNORMAL HIGH (ref 3.3–19.4)
Kappa, lambda light chain ratio: 1.34 (ref 0.26–1.65)
Lambda free light chains: 22.1 mg/L (ref 5.7–26.3)

## 2021-01-27 LAB — MULTIPLE MYELOMA PANEL, SERUM
Albumin SerPl Elph-Mcnc: 3.5 g/dL (ref 2.9–4.4)
Albumin/Glob SerPl: 1.3 (ref 0.7–1.7)
Alpha 1: 0.2 g/dL (ref 0.0–0.4)
Alpha2 Glob SerPl Elph-Mcnc: 0.9 g/dL (ref 0.4–1.0)
B-Globulin SerPl Elph-Mcnc: 0.9 g/dL (ref 0.7–1.3)
Gamma Glob SerPl Elph-Mcnc: 0.9 g/dL (ref 0.4–1.8)
Globulin, Total: 2.9 g/dL (ref 2.2–3.9)
IgA: 184 mg/dL (ref 87–352)
IgG (Immunoglobin G), Serum: 1009 mg/dL (ref 586–1602)
IgM (Immunoglobulin M), Srm: 22 mg/dL — ABNORMAL LOW (ref 26–217)
Total Protein ELP: 6.4 g/dL (ref 6.0–8.5)

## 2021-01-30 ENCOUNTER — Telehealth (HOSPITAL_BASED_OUTPATIENT_CLINIC_OR_DEPARTMENT_OTHER): Payer: Medicare PPO | Admitting: Hematology and Oncology

## 2021-01-30 ENCOUNTER — Encounter: Payer: Self-pay | Admitting: Hematology and Oncology

## 2021-01-30 DIAGNOSIS — C9001 Multiple myeloma in remission: Secondary | ICD-10-CM

## 2021-01-30 DIAGNOSIS — E66813 Obesity, class 3: Secondary | ICD-10-CM

## 2021-01-30 DIAGNOSIS — Z299 Encounter for prophylactic measures, unspecified: Secondary | ICD-10-CM

## 2021-01-30 DIAGNOSIS — Z6841 Body Mass Index (BMI) 40.0 and over, adult: Secondary | ICD-10-CM

## 2021-01-30 NOTE — Assessment & Plan Note (Signed)
She tolerated pomalidomide well No recent infection Recent myeloma showed complete response.  She will continue treatment as directed She will continue aspirin for DVT prophylaxis Her recent bone density scan is satisfactory; we have discontinued Zometa from previous visit She will continue calcium with vitamin D and I also recommend acyclovir for antiviral prophylaxis

## 2021-01-30 NOTE — Assessment & Plan Note (Signed)
She is highly motivated She has lost some weight I encouraged the patient to continue her best effort

## 2021-01-30 NOTE — Assessment & Plan Note (Signed)
She has received her recent influenza vaccination Overall, the status of her health is good Her blood pressure is coming down and she has appointment to see her primary care doctor soon to discuss blood pressure management I am hopeful with her weight loss effort, she can stop her antihypertensives in the future

## 2021-01-30 NOTE — Progress Notes (Signed)
HEMATOLOGY-ONCOLOGY ELECTRONIC VISIT PROGRESS NOTE  Patient Care Team: Lucianne Lei, MD as PCP - General (Family Medicine) Jodi Marble, MD as Consulting Physician (Otolaryngology) Heath Lark, MD as Consulting Physician (Hematology and Oncology)  I connected with by [facility] video conference and verified that I am speaking with the correct person using two identifiers.  I discussed the limitations, risks, security and privacy concerns of performing an evaluation and management service by [facility] and the availability of in person appointments.  I also discussed with the patient that there may be a patient responsible charge related to this service. The patient expressed understanding and agreed to proceed.   ASSESSMENT & PLAN:  Multiple myeloma in remission ([place]) She tolerated pomalidomide well No recent infection Recent myeloma showed complete response.  She will continue treatment as directed She will continue aspirin for DVT prophylaxis Her recent bone density scan is satisfactory; we have discontinued Zometa from previous visit She will continue calcium with vitamin D and I also recommend acyclovir for antiviral prophylaxis  Class 3 severe obesity with serious comorbidity and body mass index (BMI) of 40.0 to 44.9 in adult [facility]) She is highly motivated She has lost some weight I encouraged the patient to continue her best effort  Preventive measure She has received her recent influenza vaccination Overall, the status of her health is good Her blood pressure is coming down and she has appointment to see her primary care doctor soon to discuss blood pressure management I am hopeful with her weight loss effort, she can stop her antihypertensives in the future  No orders of the defined types were placed in this encounter.   INTERVAL HISTORY: Please see below for problem oriented charting. The purpose of today's virtual visit is to discuss recent test results and her myeloma  treatment She is compliant taking Pomalyst as directed No recent infection, fever or chills She is working hard to try to lose weight.  She cut back some carbohydrate intake, exercise more and drink more liquids Her altered taste sensation due to anosmia is affecting her appetite overall She denies new bone pain  SUMMARY OF ONCOLOGIC HISTORY: Oncology History  Multiple myeloma in remission ([place])  03/22/2016 Bone Marrow Biopsy   Bone Marrow Biopsy: Plasma cells 17% by Aspirate and 30% by CD 138 stain. Plasma cell neoplasm, Kappa Restricted Normal cytogenetics, FISH positive for +11 and +14 and +7   04/13/2016 Imaging   Skeletal survey showed lucencies within the calvarium worrisome for myeloma. No definite abnormal lytic or blastic lesions are observed elsewhere.   04/19/2016 - 09/10/2016 Chemotherapy   The patient consented to treatment with Revlimid, dexamethasone and Velcade   09/14/2016 Bone Marrow Biopsy   In summary, there is a normal cellular marrow with trilineage hematopoiesis.  A mild plasmacytosis is present, but there is no definitive evidence of residual multiple myeloma   10/12/2016 - 10/12/2016 Chemotherapy   She received melphalan as conditioning chemo   10/13/2016 Bone Marrow Transplant   She received autologous stem cell transplant at [facility]   01/24/2017 Bone Marrow Biopsy   BONE MARROW: -          Normocellular marrow for age (50%) with trilineage hematopoiesis. -     No morphologic or immunohistochemical evidence of involvement by plasma cell neoplasm (see comment)  PERIPHERAL BLOOD: -          Unremarkable (see CBC data)  COMMENT: The bone marrow is normocellular for age and shows adequate trilineage hematopoiesis without significant (<10%) dysplastic  changes present. Blasts are not increased. Plasma cells represent about 2% of total cells on aspirate smears. Appropriately controlled immunohistochemical stains are performed on the core biopsy. CD138 highlights  small interstitial plasma cells comprising approximately 2% of total cells. These plasma cells appear polytypic as demonstrated by kappa and lambda in-situ hybridization.   01/25/2017 PET scan   No FDG avid osseous lesions or masses are identified.   02/18/2017 - 04/01/2017 Chemotherapy   She received weekly Velcade, Pomalyst and Dexamethasone   04/15/2017 -  Chemotherapy   The patient had Pomalyst only   04/20/2017 Bone Marrow Biopsy   Bone Marrow (BM) and Peripheral Blood (PB) FINAL PATHOLOGIC DIAGNOSIS BONE MARROW: Normocellular bone marrow (40%) with normal numbers of megakaryocytes, erythroid hyperplasia and no increase in plasma cells (1%).     REVIEW OF SYSTEMS:   Constitutional: Denies fevers, chills or abnormal weight loss Eyes: Denies blurriness of vision Ears, nose, mouth, throat, and face: Denies mucositis or sore throat Respiratory: Denies cough, dyspnea or wheezes Cardiovascular: Denies palpitation, chest discomfort Gastrointestinal:  Denies nausea, heartburn or change in bowel habits Skin: Denies abnormal skin rashes Lymphatics: Denies new lymphadenopathy or easy bruising Neurological:Denies numbness, tingling or new weaknesses Behavioral/Psych: Mood is stable, no new changes  Extremities: No lower extremity edema All other systems were reviewed with the patient and are negative.  I have reviewed the past medical history, past surgical history, social history and family history with the patient and they are unchanged from previous note.  ALLERGIES:  is allergic to revlimid [lenalidomide].  MEDICATIONS:  Current Outpatient Medications  Medication Sig Dispense Refill   docusate sodium (COLACE) 100 MG capsule Take 200 mg by mouth daily.     acetaminophen (TYLENOL) 500 MG tablet Take 500 mg by mouth every 6 (six) hours as needed.     acyclovir (ZOVIRAX) 400 MG tablet Take 1 tablet (400 mg total) by mouth daily. 90 tablet 6   ASPIRIN 81 PO Take 81 mg by mouth daily.      Calcium Carbonate-Vitamin D (CALCIUM PLUS VITAMIN D PO) Take 1 tablet daily by mouth.     fluticasone (FLONASE) 50 MCG/ACT nasal spray Place into both nostrils daily as needed for allergies or rhinitis.     gabapentin (NEURONTIN) 300 MG capsule Take 1 capsule (300 mg total) by mouth daily. 360 capsule 11   losartan-hydrochlorothiazide (HYZAAR) 100-25 MG tablet Take 1 tablet by mouth daily. (Patient taking differently: Take 0.5 tablets by mouth daily. TAKES 1/2 TABLET DAILY) 90 tablet 3   Multiple Vitamin (MULTIVITAMIN) tablet Take 1 tablet by mouth daily.     Polyethylene Glycol 3350 (MIRALAX PO) Take by mouth as needed.     POMALYST 2 MG capsule TAKE 1 CAPSULE BY MOUTH ONCE DAILY FOR 21 DAYS ON AND 7 DAYS OFF 21 capsule 1   rosuvastatin (CRESTOR) 10 MG tablet Take 10 mg by mouth as directed. One tablet before bedtime.     No current facility-administered medications for this visit.    PHYSICAL EXAMINATION: ECOG PERFORMANCE STATUS: 0 - Asymptomatic  LABORATORY DATA:  I have reviewed the data as listed CMP Latest Ref Rng & Units 01/23/2021 10/20/2020 09/08/2020  Glucose 70 - 99 mg/dL 89 50(L) 92  BUN 8 - 23 mg/dL 18 19 20   Creatinine 0.44 - 1.00 mg/dL 0.98 1.01(H) 0.94  Sodium 135 - 145 mmol/L 140 142 138  Potassium 3.5 - 5.1 mmol/L 3.9 3.7 4.6  Chloride 98 - 111 mmol/L 104 103 99  CO2  22 - 32 mmol/L 26 29 26   Calcium 8.9 - 10.3 mg/dL 9.7 9.6 9.5  Total Protein 6.5 - 8.1 g/dL 7.0 7.2 -  Total Bilirubin 0.3 - 1.2 mg/dL 0.7 1.0 -  Alkaline Phos 38 - 126 U/L 83 71 -  AST 15 - 41 U/L 16 15 -  ALT 0 - 44 U/L 11 11 -    Lab Results  Component Value Date   WBC 4.0 01/23/2021   HGB 13.1 01/23/2021   HCT 40.6 01/23/2021   MCV 89.4 01/23/2021   PLT 223 01/23/2021   NEUTROABS 2.0 01/23/2021     I discussed the assessment and treatment plan with the patient. The patient was provided an opportunity to ask questions and all were answered. The patient agreed with the plan and demonstrated  an understanding of the instructions. The patient was advised to call back or seek an in-person evaluation if the symptoms worsen or if the condition fails to improve as anticipated.    I spent 20 minutes for the appointment reviewing test results, discuss management and coordination of care.  Heath Lark, MD 01/30/2021 10:13 AM

## 2021-02-03 DIAGNOSIS — C9001 Multiple myeloma in remission: Secondary | ICD-10-CM | POA: Diagnosis not present

## 2021-02-03 DIAGNOSIS — Z9484 Stem cells transplant status: Secondary | ICD-10-CM | POA: Diagnosis not present

## 2021-02-03 DIAGNOSIS — F325 Major depressive disorder, single episode, in full remission: Secondary | ICD-10-CM | POA: Diagnosis not present

## 2021-02-03 DIAGNOSIS — E785 Hyperlipidemia, unspecified: Secondary | ICD-10-CM | POA: Diagnosis not present

## 2021-02-03 DIAGNOSIS — Z79899 Other long term (current) drug therapy: Secondary | ICD-10-CM | POA: Diagnosis not present

## 2021-02-03 DIAGNOSIS — J45909 Unspecified asthma, uncomplicated: Secondary | ICD-10-CM | POA: Diagnosis not present

## 2021-02-03 DIAGNOSIS — I10 Essential (primary) hypertension: Secondary | ICD-10-CM | POA: Diagnosis not present

## 2021-02-03 DIAGNOSIS — B009 Herpesviral infection, unspecified: Secondary | ICD-10-CM | POA: Diagnosis not present

## 2021-02-05 ENCOUNTER — Other Ambulatory Visit: Payer: Self-pay | Admitting: Hematology and Oncology

## 2021-02-20 DIAGNOSIS — I10 Essential (primary) hypertension: Secondary | ICD-10-CM | POA: Diagnosis not present

## 2021-02-20 DIAGNOSIS — R7309 Other abnormal glucose: Secondary | ICD-10-CM | POA: Diagnosis not present

## 2021-02-20 DIAGNOSIS — M130 Polyarthritis, unspecified: Secondary | ICD-10-CM | POA: Diagnosis not present

## 2021-02-20 DIAGNOSIS — E785 Hyperlipidemia, unspecified: Secondary | ICD-10-CM | POA: Diagnosis not present

## 2021-02-26 DIAGNOSIS — M25562 Pain in left knee: Secondary | ICD-10-CM | POA: Diagnosis not present

## 2021-02-26 DIAGNOSIS — M170 Bilateral primary osteoarthritis of knee: Secondary | ICD-10-CM | POA: Diagnosis not present

## 2021-02-26 DIAGNOSIS — M1712 Unilateral primary osteoarthritis, left knee: Secondary | ICD-10-CM | POA: Diagnosis not present

## 2021-02-26 DIAGNOSIS — M1711 Unilateral primary osteoarthritis, right knee: Secondary | ICD-10-CM | POA: Diagnosis not present

## 2021-02-26 DIAGNOSIS — M25561 Pain in right knee: Secondary | ICD-10-CM | POA: Diagnosis not present

## 2021-02-27 DIAGNOSIS — R0981 Nasal congestion: Secondary | ICD-10-CM | POA: Diagnosis not present

## 2021-02-27 DIAGNOSIS — E7800 Pure hypercholesterolemia, unspecified: Secondary | ICD-10-CM | POA: Diagnosis not present

## 2021-02-27 DIAGNOSIS — I10 Essential (primary) hypertension: Secondary | ICD-10-CM | POA: Diagnosis not present

## 2021-02-28 ENCOUNTER — Ambulatory Visit (HOSPITAL_COMMUNITY)
Admission: EM | Admit: 2021-02-28 | Discharge: 2021-02-28 | Disposition: A | Discharge status: Home / Self Care | Payer: Medicare PPO | Attending: Medical Oncology | Admitting: Medical Oncology

## 2021-02-28 ENCOUNTER — Other Ambulatory Visit: Payer: Self-pay

## 2021-02-28 ENCOUNTER — Encounter (HOSPITAL_COMMUNITY): Payer: Self-pay | Admitting: Emergency Medicine

## 2021-02-28 DIAGNOSIS — J069 Acute upper respiratory infection, unspecified: Secondary | ICD-10-CM

## 2021-02-28 DIAGNOSIS — Z8709 Personal history of other diseases of the respiratory system: Secondary | ICD-10-CM

## 2021-02-28 MED ORDER — HYDROCOD POLST-CPM POLST ER 10-8 MG/5ML PO SUER: 5.0000 mL | Freq: Two times a day (BID) | ORAL | 0 refills | Status: AC | PRN

## 2021-02-28 NOTE — ED Provider Notes (Addendum)
[place]    CSN: [id number] Arrival date & time: 02/28/21  1445      History   Chief Complaint Chief Complaint  Patient presents with   Cough    HPI Carolyn Newman is a 70 y.o. female.   HPI  Cough: Patient reports that she gets bronchitis from time to time.  She states that recently she started having some nasal congestion and was seen by her PCP office.  She was given some nasal sprays which has helped that but now the cold has gone down into her chest.  She states that this is typical for her but she uses Tussionex as this is according to patient the only medication that works for her cough to prevent bronchitis and pneumonia.  She brings in an old bottle to show me and states that she does really well with this medication without any negative side effects.  She states that she did not have a cough yesterday at her PCP office so she did not ask for refill and her PCP is closed today.  She is about to go out of town and is hoping to get a refill.  She denies any fever, shortness of breath or chest pain. Past Medical History:  Diagnosis Date   Alopecia    Anemia    Anosmia    Back pain    Bilateral knee pain    Cancer (HCC)    myeloma   Chewing difficulty    Constipation    Elevated serum creatinine    Hyperlipidemia    Hypertension    Joint pain    Leukopenia    Multiple myeloma (HCC)    Neuropathy    Right and left feet    Obesity    SOB (shortness of breath)    Thoracic aortic aneurysm without rupture     Patient Active Problem List   Diagnosis Date Noted   Other fatigue 07/25/2020   Insulin resistance 04/13/2020   Visceral obesity 69/48/5462   Metabolic syndrome 70/35/0093   Preventive measure 01/25/2020   Leukopenia due to antineoplastic chemotherapy ([place]) 01/11/2019   Other constipation 01/11/2019   Hyperlipidemia 06/23/2018   Thoracic aortic aneurysm without rupture 05/19/2018   Anosmia 11/15/2017   Class 3 severe obesity with serious  comorbidity and body mass index (BMI) of 40.0 to 44.9 in adult ([place]) 08/18/2017   Hypotension due to drugs 04/15/2017   Sinus congestion 03/25/2017   S/P autologous bone marrow transplantation ([place]) 02/01/2017   Peripheral neuropathy due to chemotherapy ([place]) 07/13/2016   Physical debility 07/13/2016   Allergic rhinitis 07/02/2016   Rash due to allergy 07/02/2016   Financial difficulty 05/22/2016   Pancytopenia, acquired ([place]) 04/19/2016   Multiple myeloma not having achieved remission ([place]) 04/05/2016   Multiple myeloma in remission ([place]) 04/04/2016   Normocytic anemia 03/09/2016    Past Surgical History:  Procedure Laterality Date   CATARACT EXTRACTION     EYE SURGERY     INJECTION KNEE     LIMBAL STEM CELL TRANSPLANT      OB History     Gravida  0   Para  0   Term  0   Preterm  0   AB  0   Living  0      SAB  0   IAB  0   Ectopic  0   Multiple  0   Live Births  0  Home Medications    Prior to Admission medications   Medication Sig Start Date End Date Taking? Authorizing Provider  acetaminophen (TYLENOL) 500 MG tablet Take 500 mg by mouth every 6 (six) hours as needed.    [provider]  acyclovir (ZOVIRAX) 400 MG tablet Take 1 tablet (400 mg total) by mouth daily. 07/25/20   Heath Lark, MD  ASPIRIN 81 PO Take 81 mg by mouth daily.    [provider]  Calcium Carbonate-Vitamin D (CALCIUM PLUS VITAMIN D PO) Take 1 tablet daily by mouth. 02/16/17   [provider]  docusate sodium (COLACE) 100 MG capsule Take 200 mg by mouth daily.    [provider]  fluticasone (FLONASE) 50 MCG/ACT nasal spray Place into both nostrils daily as needed for allergies or rhinitis.    [provider]  gabapentin (NEURONTIN) 300 MG capsule Take 1 capsule (300 mg total) by mouth daily. 10/24/20   Heath Lark, MD  losartan-hydrochlorothiazide (HYZAAR) 100-25 MG tablet Take 1 tablet by mouth daily. Patient taking  differently: Take 0.5 tablets by mouth daily. TAKES 1/2 TABLET DAILY 04/26/16   Heath Lark, MD  Multiple Vitamin (MULTIVITAMIN) tablet Take 1 tablet by mouth daily.    [provider]  Polyethylene Glycol 3350 (MIRALAX PO) Take by mouth as needed.    [provider]  POMALYST 2 MG capsule TAKE 1 CAPSULE BY MOUTH ONCE DAILY FOR 21 DAYS ON AND 7 DAYS OFF 02/05/21   Heath Lark, MD  rosuvastatin (CRESTOR) 10 MG tablet Take 10 mg by mouth as directed. One tablet before bedtime.    [provider]    Family History Family History  Problem Relation Age of Onset   Cancer Mother        lymphoma   Diabetes Mother    Hypertension Mother    Hyperlipidemia Mother    Depression Mother    Obesity Mother    Cancer Father        prostate ca   Heart disease Father    Colon cancer Maternal Grandmother    Diabetes Maternal Grandfather     Social History Social History   Tobacco Use   Smoking status: Never   Smokeless tobacco: Never  Vaping Use   Vaping Use: Never used  Substance Use Topics   Alcohol use: No   Drug use: No     Allergies   Revlimid [lenalidomide]   Review of Systems Review of Systems  As stated above in HPI Physical Exam Triage Vital Signs ED Triage Vitals  Enc Vitals Group     BP 02/28/21 1602 121/76     Pulse Rate 02/28/21 1602 74     Resp 02/28/21 1602 19     Temp 02/28/21 1602 99.3 F (37.4 C)     Temp Source 02/28/21 1602 Oral     SpO2 02/28/21 1602 93 %     Weight --      Height --      Head Circumference --      Peak Flow --      Pain Score 02/28/21 1601 0     Pain Loc --      Pain Edu? --      Excl. in [place]? --    No data found.  Updated Vital Signs BP 121/76 (BP Location: Right Arm)   Pulse 74   Temp 99.3 F (37.4 C) (Oral)   Resp 19   SpO2 93%   Physical Exam Vitals and  nursing note reviewed.  Constitutional:      General: She is not in acute distress.    Appearance: Normal appearance. She is not  ill-appearing, toxic-appearing or diaphoretic.  HENT:     Head: Normocephalic and atraumatic.     Right Ear: Tympanic membrane normal.     Left Ear: Tympanic membrane normal.     Nose: Rhinorrhea present.     Mouth/Throat:     Mouth: Mucous membranes are moist.     Pharynx: Oropharynx is clear. No oropharyngeal exudate or posterior oropharyngeal erythema.  Eyes:     Extraocular Movements: Extraocular movements intact.     Pupils: Pupils are equal, round, and reactive to light.  Cardiovascular:     Rate and Rhythm: Normal rate and regular rhythm.     Heart sounds: Normal heart sounds.  Pulmonary:     Effort: Pulmonary effort is normal.     Breath sounds: Normal breath sounds.  Musculoskeletal:     Cervical back: Normal range of motion and neck supple.  Lymphadenopathy:     Cervical: No cervical adenopathy.  Skin:    General: Skin is warm.  Neurological:     Mental Status: She is alert and oriented to person, place, and time.     UC Treatments / Results  Labs (all labs ordered are listed, but only abnormal results are displayed) Labs Reviewed - No data to display  EKG   Radiology No results found.  Procedures Procedures (including critical care time)  Medications Ordered in UC Medications - No data to display  Initial Impression / Assessment and Plan / UC Course  I have reviewed the triage vital signs and the nursing notes.  Pertinent labs & imaging results that were available during my care of the patient were reviewed by me and considered in my medical decision making (see chart for details).     New.  Likely viral in nature.  Reviewed PMPD which does show that she gets this prescription about twice per year.  She states that she does well with it without any negative side effects and she declines any other medications such as tessalon, prednisone.  I am going to send in a very small prescription of this medication for her to use as needed to help with cough and  prevent bronchitis or pneumonia secondary to bronchitis.  We reviewed common potential side effects, precautions and warnings.Also reviewed red flag signs and symptoms. Follow up PRN.  Final Clinical Impressions(s) / UC Diagnoses   Final diagnoses:  None   Discharge Instructions   None    ED Prescriptions   None    PDMP not reviewed this encounter.   Hughie Closs, PA-C 02/28/21 1615    Hughie Closs, PA-C 02/28/21 1617

## 2021-02-28 NOTE — ED Triage Notes (Signed)
Pt c/o cough since yesterday. Reports that her PCP hasnt refilled her cough medications that is normally on and getting on plane tomorrow.

## 2021-03-04 ENCOUNTER — Other Ambulatory Visit: Payer: Self-pay | Admitting: Hematology and Oncology

## 2021-03-04 NOTE — Telephone Encounter (Signed)
Pls refill electronically °

## 2021-03-19 DIAGNOSIS — M25561 Pain in right knee: Secondary | ICD-10-CM | POA: Diagnosis not present

## 2021-03-19 DIAGNOSIS — M1711 Unilateral primary osteoarthritis, right knee: Secondary | ICD-10-CM | POA: Diagnosis not present

## 2021-03-25 DIAGNOSIS — M25562 Pain in left knee: Secondary | ICD-10-CM | POA: Diagnosis not present

## 2021-03-25 DIAGNOSIS — M1712 Unilateral primary osteoarthritis, left knee: Secondary | ICD-10-CM | POA: Diagnosis not present

## 2021-03-26 DIAGNOSIS — M25561 Pain in right knee: Secondary | ICD-10-CM | POA: Diagnosis not present

## 2021-03-26 DIAGNOSIS — M1711 Unilateral primary osteoarthritis, right knee: Secondary | ICD-10-CM | POA: Diagnosis not present

## 2021-04-03 ENCOUNTER — Other Ambulatory Visit: Payer: Self-pay | Admitting: Hematology and Oncology

## 2021-04-06 ENCOUNTER — Encounter: Payer: Self-pay | Admitting: Hematology and Oncology

## 2021-04-06 DIAGNOSIS — M25562 Pain in left knee: Secondary | ICD-10-CM | POA: Diagnosis not present

## 2021-04-06 DIAGNOSIS — M1712 Unilateral primary osteoarthritis, left knee: Secondary | ICD-10-CM | POA: Diagnosis not present

## 2021-04-06 NOTE — Telephone Encounter (Signed)
Ok thanks 

## 2021-04-06 NOTE — Telephone Encounter (Signed)
Can you refill this electronically/

## 2021-04-07 DIAGNOSIS — M25561 Pain in right knee: Secondary | ICD-10-CM | POA: Diagnosis not present

## 2021-04-07 DIAGNOSIS — M1711 Unilateral primary osteoarthritis, right knee: Secondary | ICD-10-CM | POA: Diagnosis not present

## 2021-04-15 DIAGNOSIS — M1712 Unilateral primary osteoarthritis, left knee: Secondary | ICD-10-CM | POA: Diagnosis not present

## 2021-04-15 DIAGNOSIS — M25562 Pain in left knee: Secondary | ICD-10-CM | POA: Diagnosis not present

## 2021-04-23 DIAGNOSIS — M1711 Unilateral primary osteoarthritis, right knee: Secondary | ICD-10-CM | POA: Diagnosis not present

## 2021-04-23 DIAGNOSIS — M25561 Pain in right knee: Secondary | ICD-10-CM | POA: Diagnosis not present

## 2021-04-27 ENCOUNTER — Other Ambulatory Visit: Payer: Self-pay

## 2021-04-27 ENCOUNTER — Other Ambulatory Visit: Payer: Medicare PPO

## 2021-04-27 ENCOUNTER — Inpatient Hospital Stay: Payer: Medicare PPO | Attending: Hematology and Oncology

## 2021-04-27 DIAGNOSIS — C9001 Multiple myeloma in remission: Secondary | ICD-10-CM | POA: Diagnosis not present

## 2021-04-27 LAB — CBC WITH DIFFERENTIAL/PLATELET
Abs Immature Granulocytes: 0.04 10*3/uL (ref 0.00–0.07)
Basophils Absolute: 0.1 10*3/uL (ref 0.0–0.1)
Basophils Relative: 2 %
Eosinophils Absolute: 0.2 10*3/uL (ref 0.0–0.5)
Eosinophils Relative: 3 %
HCT: 39.2 % (ref 36.0–46.0)
Hemoglobin: 12.7 g/dL (ref 12.0–15.0)
Immature Granulocytes: 1 %
Lymphocytes Relative: 19 %
Lymphs Abs: 0.9 10*3/uL (ref 0.7–4.0)
MCH: 29.4 pg (ref 26.0–34.0)
MCHC: 32.4 g/dL (ref 30.0–36.0)
MCV: 90.7 fL (ref 80.0–100.0)
Monocytes Absolute: 0.3 10*3/uL (ref 0.1–1.0)
Monocytes Relative: 6 %
Neutro Abs: 3.2 10*3/uL (ref 1.7–7.7)
Neutrophils Relative %: 69 %
Platelets: 227 10*3/uL (ref 150–400)
RBC: 4.32 MIL/uL (ref 3.87–5.11)
RDW: 16 % — ABNORMAL HIGH (ref 11.5–15.5)
WBC: 4.7 10*3/uL (ref 4.0–10.5)
nRBC: 0 % (ref 0.0–0.2)

## 2021-04-27 LAB — COMPREHENSIVE METABOLIC PANEL
ALT: 12 U/L (ref 0–44)
AST: 20 U/L (ref 15–41)
Albumin: 3.6 g/dL (ref 3.5–5.0)
Alkaline Phosphatase: 81 U/L (ref 38–126)
Anion gap: 9 (ref 5–15)
BUN: 18 mg/dL (ref 8–23)
CO2: 28 mmol/L (ref 22–32)
Calcium: 9.3 mg/dL (ref 8.9–10.3)
Chloride: 105 mmol/L (ref 98–111)
Creatinine, Ser: 1.07 mg/dL — ABNORMAL HIGH (ref 0.44–1.00)
GFR, Estimated: 56 mL/min — ABNORMAL LOW (ref >60)
Glucose, Bld: 80 mg/dL (ref 70–99)
Potassium: 4 mmol/L (ref 3.5–5.1)
Sodium: 142 mmol/L (ref 135–145)
Total Bilirubin: 0.6 mg/dL (ref 0.3–1.2)
Total Protein: 7.3 g/dL (ref 6.5–8.1)

## 2021-04-28 LAB — KAPPA/LAMBDA LIGHT CHAINS
Kappa free light chain: 30.8 mg/L — ABNORMAL HIGH (ref 3.3–19.4)
Kappa, lambda light chain ratio: 1.38 (ref 0.26–1.65)
Lambda free light chains: 22.4 mg/L (ref 5.7–26.3)

## 2021-04-29 DIAGNOSIS — M1712 Unilateral primary osteoarthritis, left knee: Secondary | ICD-10-CM | POA: Diagnosis not present

## 2021-04-29 DIAGNOSIS — M25562 Pain in left knee: Secondary | ICD-10-CM | POA: Diagnosis not present

## 2021-04-30 ENCOUNTER — Encounter: Payer: Self-pay | Admitting: Hematology and Oncology

## 2021-04-30 LAB — MULTIPLE MYELOMA PANEL, SERUM
Albumin SerPl Elph-Mcnc: 3.3 g/dL (ref 2.9–4.4)
Albumin/Glob SerPl: 1.1 (ref 0.7–1.7)
Alpha 1: 0.2 g/dL (ref 0.0–0.4)
Alpha2 Glob SerPl Elph-Mcnc: 0.9 g/dL (ref 0.4–1.0)
B-Globulin SerPl Elph-Mcnc: 1 g/dL (ref 0.7–1.3)
Gamma Glob SerPl Elph-Mcnc: 1 g/dL (ref 0.4–1.8)
Globulin, Total: 3.1 g/dL (ref 2.2–3.9)
IgA: 225 mg/dL (ref 87–352)
IgG (Immunoglobin G), Serum: 966 mg/dL (ref 586–1602)
IgM (Immunoglobulin M), Srm: 27 mg/dL (ref 26–217)
Total Protein ELP: 6.4 g/dL (ref 6.0–8.5)

## 2021-05-01 ENCOUNTER — Telehealth (HOSPITAL_BASED_OUTPATIENT_CLINIC_OR_DEPARTMENT_OTHER): Payer: Medicare PPO | Admitting: Hematology and Oncology

## 2021-05-01 ENCOUNTER — Encounter: Payer: Self-pay | Admitting: Hematology and Oncology

## 2021-05-01 DIAGNOSIS — C9001 Multiple myeloma in remission: Secondary | ICD-10-CM

## 2021-05-01 DIAGNOSIS — Z6839 Body mass index (BMI) 39.0-39.9, adult: Secondary | ICD-10-CM

## 2021-05-01 NOTE — Assessment & Plan Note (Signed)
She tolerated pomalidomide well No recent infection Recent myeloma showed complete response.  She will continue treatment as directed She will continue aspirin for DVT prophylaxis Her recent bone density scan is satisfactory; we have discontinued Zometa from previous visit She will continue calcium with vitamin D and I also recommend acyclovir for antiviral prophylaxis

## 2021-05-01 NOTE — Assessment & Plan Note (Signed)
She is doing excellent work with weight loss through dietary modification and exercise Her body mass index has improved from class III obesity to class II She will continue her effort and I encouraged the patient

## 2021-05-01 NOTE — Progress Notes (Signed)
HEMATOLOGY-ONCOLOGY ELECTRONIC VISIT PROGRESS NOTE  Patient Care Team: Lucianne Lei, MD as PCP - General (Family Medicine) Jodi Marble, MD as Consulting Physician (Otolaryngology) Heath Lark, MD as Consulting Physician (Hematology and Oncology)  I connected with the patient via telephone conference and verified that I am speaking with the correct person using two identifiers. The patient's location is at home and I am providing care from the [facility] I discussed the limitations, risks, security and privacy concerns of performing an evaluation and management service by e-visits and the availability of in person appointments.  I also discussed with the patient that there may be a patient responsible charge related to this service. The patient expressed understanding and agreed to proceed.   ASSESSMENT & PLAN:  Multiple myeloma in remission ([place]) She tolerated pomalidomide well No recent infection Recent myeloma showed complete response.  She will continue treatment as directed She will continue aspirin for DVT prophylaxis Her recent bone density scan is satisfactory; we have discontinued Zometa from previous visit She will continue calcium with vitamin D and I also recommend acyclovir for antiviral prophylaxis  Class 2 obesity due to excess calories with body mass index (BMI) of 39.0 to 39.9 in adult She is doing excellent work with weight loss through dietary modification and exercise Her body mass index has improved from class III obesity to class II She will continue her effort and I encouraged the patient  No orders of the defined types were placed in this encounter.   INTERVAL HISTORY: Please see below for problem oriented charting. The purpose of today's discussion is to review recent test results She is taking single agent Revlimid as maintenance treatment for multiple myeloma She is doing very well No recent infection She is able to lose more weight since last  time I saw her  SUMMARY OF ONCOLOGIC HISTORY: Oncology History  Multiple myeloma in remission ([place])  03/22/2016 Bone Marrow Biopsy   Bone Marrow Biopsy: Plasma cells 17% by Aspirate and 30% by CD 138 stain. Plasma cell neoplasm, Kappa Restricted Normal cytogenetics, FISH positive for +11 and +14 and +7   04/13/2016 Imaging   Skeletal survey showed lucencies within the calvarium worrisome for myeloma. No definite abnormal lytic or blastic lesions are observed elsewhere.   04/19/2016 - 09/10/2016 Chemotherapy   The patient consented to treatment with Revlimid, dexamethasone and Velcade   09/14/2016 Bone Marrow Biopsy   In summary, there is a normal cellular marrow with trilineage hematopoiesis.  A mild plasmacytosis is present, but there is no definitive evidence of residual multiple myeloma   10/12/2016 - 10/12/2016 Chemotherapy   She received melphalan as conditioning chemo   10/13/2016 Bone Marrow Transplant   She received autologous stem cell transplant at [facility]   01/24/2017 Bone Marrow Biopsy   BONE MARROW: -          Normocellular marrow for age (50%) with trilineage hematopoiesis. -     No morphologic or immunohistochemical evidence of involvement by plasma cell neoplasm (see comment)  PERIPHERAL BLOOD: -          Unremarkable (see CBC data)  COMMENT: The bone marrow is normocellular for age and shows adequate trilineage hematopoiesis without significant (<10%) dysplastic changes present. Blasts are not increased. Plasma cells represent about 2% of total cells on aspirate smears. Appropriately controlled immunohistochemical stains are performed on the core biopsy. CD138 highlights small interstitial plasma cells comprising approximately 2% of total cells. These plasma cells appear polytypic as demonstrated by  kappa and lambda in-situ hybridization.   01/25/2017 PET scan   No FDG avid osseous lesions or masses are identified.   02/18/2017 - 04/01/2017 Chemotherapy   She  received weekly Velcade, Pomalyst and Dexamethasone   04/15/2017 -  Chemotherapy   The patient had Pomalyst only   04/20/2017 Bone Marrow Biopsy   Bone Marrow (BM) and Peripheral Blood (PB) FINAL PATHOLOGIC DIAGNOSIS BONE MARROW: Normocellular bone marrow (40%) with normal numbers of megakaryocytes, erythroid hyperplasia and no increase in plasma cells (1%).     REVIEW OF SYSTEMS:   Constitutional: Denies fevers, chills or abnormal weight loss Eyes: Denies blurriness of vision Ears, nose, mouth, throat, and face: Denies mucositis or sore throat Respiratory: Denies cough, dyspnea or wheezes Cardiovascular: Denies palpitation, chest discomfort Gastrointestinal:  Denies nausea, heartburn or change in bowel habits Skin: Denies abnormal skin rashes Lymphatics: Denies new lymphadenopathy or easy bruising Neurological:Denies numbness, tingling or new weaknesses Behavioral/Psych: Mood is stable, no new changes  Extremities: No lower extremity edema All other systems were reviewed with the patient and are negative.  I have reviewed the past medical history, past surgical history, social history and family history with the patient and they are unchanged from previous note.  ALLERGIES:  is allergic to revlimid [lenalidomide].  MEDICATIONS:  Current Outpatient Medications  Medication Sig Dispense Refill   acetaminophen (TYLENOL) 500 MG tablet Take 500 mg by mouth every 6 (six) hours as needed.     acyclovir (ZOVIRAX) 400 MG tablet Take 1 tablet (400 mg total) by mouth daily. 90 tablet 6   ASPIRIN 81 PO Take 81 mg by mouth daily.     Calcium Carbonate-Vitamin D (CALCIUM PLUS VITAMIN D PO) Take 1 tablet daily by mouth.     docusate sodium (COLACE) 100 MG capsule Take 200 mg by mouth daily.     fluticasone (FLONASE) 50 MCG/ACT nasal spray Place into both nostrils daily as needed for allergies or rhinitis.     gabapentin (NEURONTIN) 300 MG capsule Take 1 capsule (300 mg total) by mouth daily.  360 capsule 11   losartan-hydrochlorothiazide (HYZAAR) 100-25 MG tablet Take 1 tablet by mouth daily. (Patient taking differently: Take 0.5 tablets by mouth daily. TAKES 1/2 TABLET DAILY) 90 tablet 3   Multiple Vitamin (MULTIVITAMIN) tablet Take 1 tablet by mouth daily.     Polyethylene Glycol 3350 (MIRALAX PO) Take by mouth as needed.     POMALYST 2 MG capsule TAKE 1 CAPSULE BY MOUTH ONCE DAILY FOR 21 DAYS ON AND 7 DAYS OFF 21 capsule 1   rosuvastatin (CRESTOR) 10 MG tablet Take 10 mg by mouth as directed. One tablet before bedtime.     No current facility-administered medications for this visit.    PHYSICAL EXAMINATION: ECOG PERFORMANCE STATUS: 0 - Asymptomatic  LABORATORY DATA:  I have reviewed the data as listed CMP Latest Ref Rng & Units 04/27/2021 01/23/2021 10/20/2020  Glucose 70 - 99 mg/dL 80 89 50(L)  BUN 8 - 23 mg/dL _0 Creatinine 0.44 - 1.00 mg/dL 1.07(H) 0.98 1.01(H)  Sodium 135 - 145 mmol/L 142 140 142  Potassium 3.5 - 5.1 mmol/L 4.0 3.9 3.7  Chloride 98 - 111 mmol/L 105 104 103  CO2 22 - 32 mmol/L _1 Calcium 8.9 - 10.3 mg/dL 9.3 9.7 9.6  Total Protein 6.5 - 8.1 g/dL 7.3 7.0 7.2  Total Bilirubin 0.3 - 1.2 mg/dL 0.6 0.7 1.0  Alkaline Phos 38 - 126 U/L 81 83 71  AST 15 - 41 U/L _0 ALT 0 - 44 U/L _1 Lab Results  Component Value Date   WBC 4.7 04/27/2021   HGB 12.7 04/27/2021   HCT 39.2 04/27/2021   MCV 90.7 04/27/2021   PLT 227 04/27/2021   NEUTROABS 3.2 04/27/2021    I discussed the assessment and treatment plan with the patient. The patient was provided an opportunity to ask questions and all were answered. The patient agreed with the plan and demonstrated an understanding of the instructions. The patient was advised to call back or seek an in-person evaluation if the symptoms worsen or if the condition fails to improve as anticipated.    I spent 20 minutes for the appointment reviewing test results, discuss management and coordination  of care.  Heath Lark, MD 05/01/2021 5:43 PM

## 2021-05-04 ENCOUNTER — Telehealth: Payer: Medicare PPO | Admitting: Hematology and Oncology

## 2021-05-05 ENCOUNTER — Other Ambulatory Visit: Payer: Self-pay | Admitting: Hematology and Oncology

## 2021-05-05 DIAGNOSIS — Z1231 Encounter for screening mammogram for malignant neoplasm of breast: Secondary | ICD-10-CM | POA: Diagnosis not present

## 2021-05-05 NOTE — Telephone Encounter (Signed)
Pls refill electronically °

## 2021-05-14 DIAGNOSIS — H04123 Dry eye syndrome of bilateral lacrimal glands: Secondary | ICD-10-CM | POA: Diagnosis not present

## 2021-05-28 ENCOUNTER — Other Ambulatory Visit: Payer: Self-pay | Admitting: Hematology and Oncology

## 2021-06-04 IMAGING — CR DG SHOULDER 2+V*R*
3 series · 3 of 3 positions shown · non-contrast
Comparison: None.

CLINICAL DATA: Chronic bilateral shoulder pain without known
injury.

EXAM:
RIGHT SHOULDER - 2+ VIEW

[w shoulder grashey right]
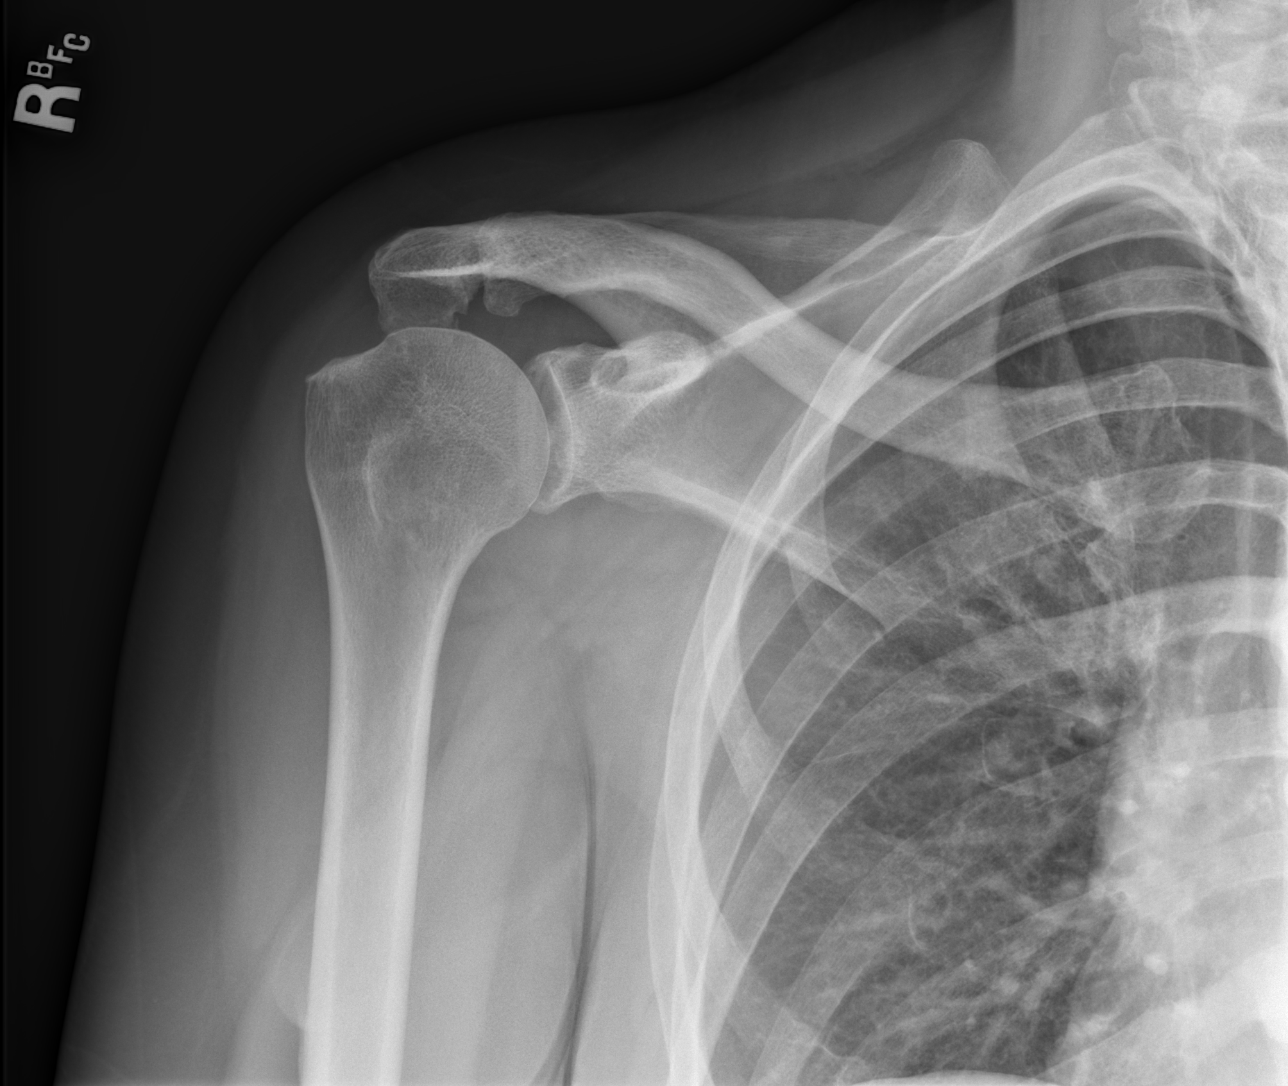

[w shoulder y-view right]
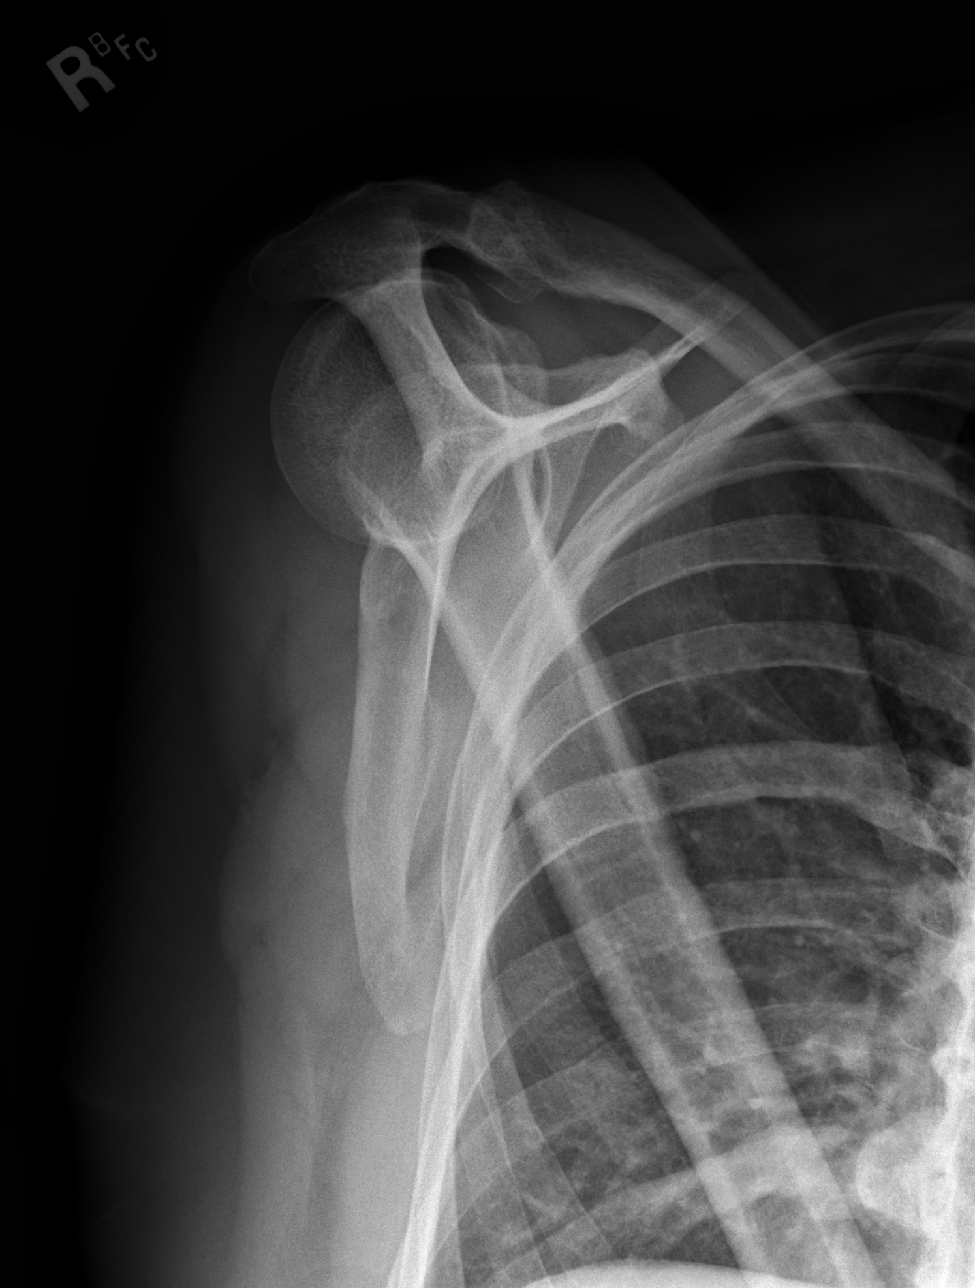

[x shoulder axillary right]
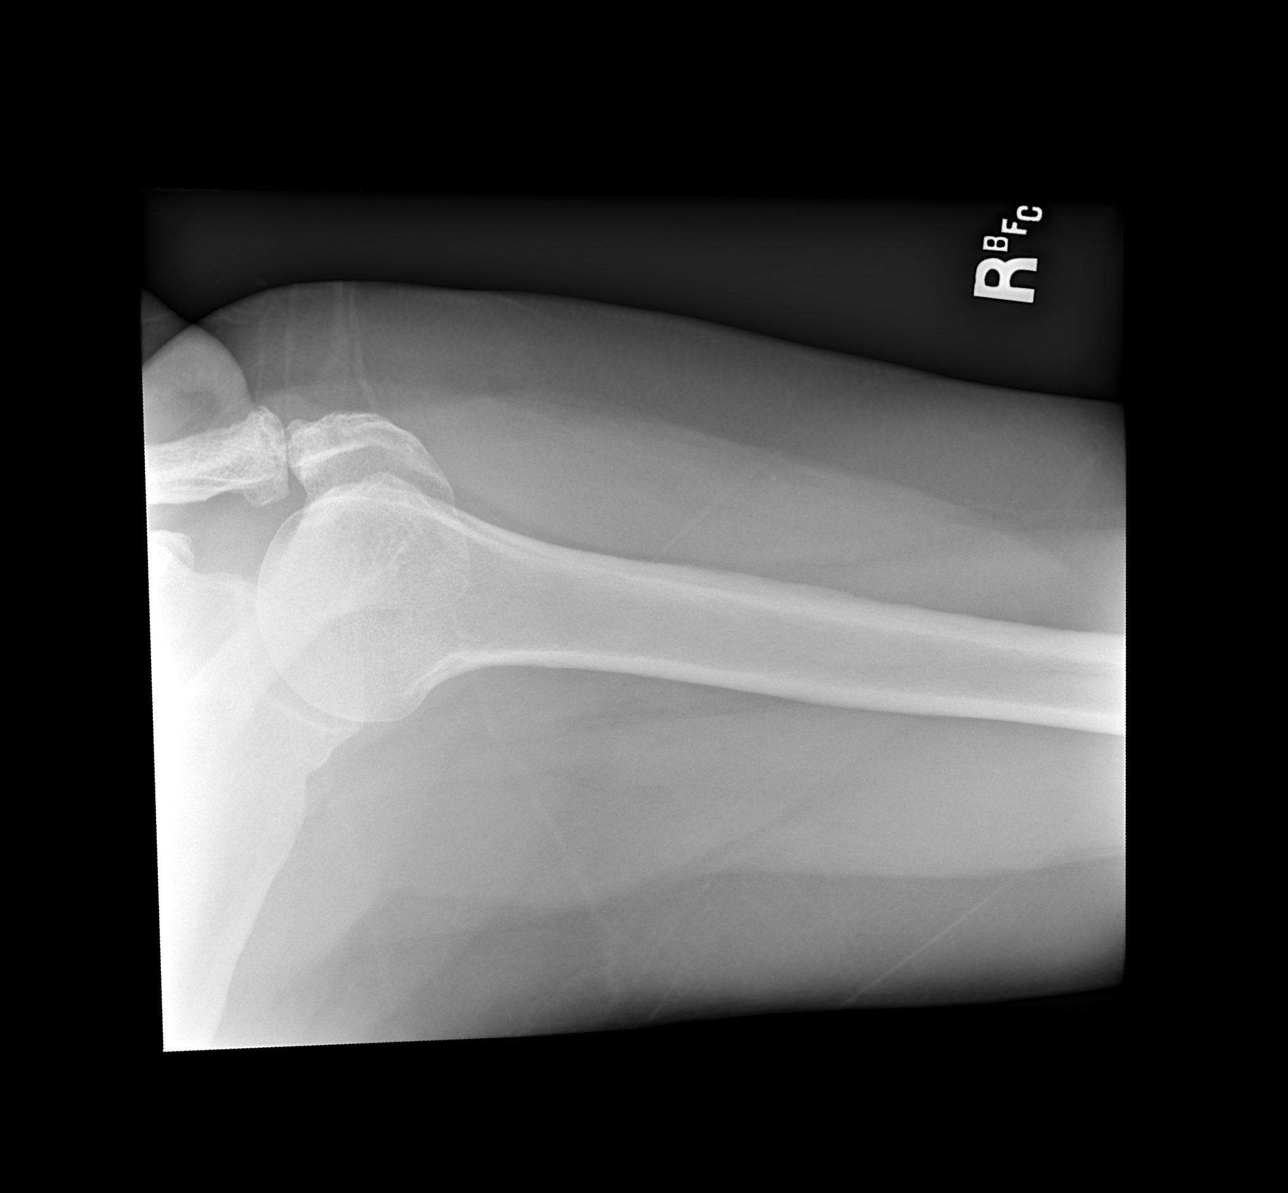

[3 of 3 positions shown; findings below may reference images not displayed]

FINDINGS: There is no evidence of fracture or dislocation. There is no
evidence of arthropathy or other focal bone abnormality. Soft
tissues are unremarkable.
IMPRESSION: Negative.

## 2021-06-05 ENCOUNTER — Ambulatory Visit
Admission: RE | Admit: 2021-06-05 | Discharge: 2021-06-05 | Disposition: A | Discharge status: Home / Self Care | Payer: Medicare PPO | Source: Ambulatory Visit | Attending: Family Medicine | Admitting: Family Medicine

## 2021-06-05 ENCOUNTER — Other Ambulatory Visit: Payer: Self-pay | Admitting: Family Medicine

## 2021-06-05 ENCOUNTER — Other Ambulatory Visit: Payer: Self-pay

## 2021-06-05 DIAGNOSIS — R059 Cough, unspecified: Secondary | ICD-10-CM

## 2021-06-05 DIAGNOSIS — I10 Essential (primary) hypertension: Secondary | ICD-10-CM | POA: Diagnosis not present

## 2021-06-05 DIAGNOSIS — J209 Acute bronchitis, unspecified: Secondary | ICD-10-CM | POA: Diagnosis not present

## 2021-06-05 DIAGNOSIS — C9001 Multiple myeloma in remission: Secondary | ICD-10-CM | POA: Diagnosis not present

## 2021-06-16 DIAGNOSIS — E669 Obesity, unspecified: Secondary | ICD-10-CM | POA: Diagnosis not present

## 2021-06-16 DIAGNOSIS — E781 Pure hyperglyceridemia: Secondary | ICD-10-CM | POA: Diagnosis not present

## 2021-06-16 DIAGNOSIS — E1169 Type 2 diabetes mellitus with other specified complication: Secondary | ICD-10-CM | POA: Diagnosis not present

## 2021-06-16 DIAGNOSIS — I10 Essential (primary) hypertension: Secondary | ICD-10-CM | POA: Diagnosis not present

## 2021-06-16 DIAGNOSIS — C9001 Multiple myeloma in remission: Secondary | ICD-10-CM | POA: Diagnosis not present

## 2021-06-16 DIAGNOSIS — E785 Hyperlipidemia, unspecified: Secondary | ICD-10-CM | POA: Diagnosis not present

## 2021-06-16 DIAGNOSIS — R0981 Nasal congestion: Secondary | ICD-10-CM | POA: Diagnosis not present

## 2021-06-16 DIAGNOSIS — Z1339 Encounter for screening examination for other mental health and behavioral disorders: Secondary | ICD-10-CM | POA: Diagnosis not present

## 2021-06-16 DIAGNOSIS — R7309 Other abnormal glucose: Secondary | ICD-10-CM | POA: Diagnosis not present

## 2021-06-16 DIAGNOSIS — Z136 Encounter for screening for cardiovascular disorders: Secondary | ICD-10-CM | POA: Diagnosis not present

## 2021-06-16 DIAGNOSIS — J069 Acute upper respiratory infection, unspecified: Secondary | ICD-10-CM | POA: Diagnosis not present

## 2021-06-16 DIAGNOSIS — Z6838 Body mass index (BMI) 38.0-38.9, adult: Secondary | ICD-10-CM | POA: Diagnosis not present

## 2021-06-26 DIAGNOSIS — I10 Essential (primary) hypertension: Secondary | ICD-10-CM | POA: Diagnosis not present

## 2021-06-26 DIAGNOSIS — C9000 Multiple myeloma not having achieved remission: Secondary | ICD-10-CM | POA: Diagnosis not present

## 2021-06-26 DIAGNOSIS — E785 Hyperlipidemia, unspecified: Secondary | ICD-10-CM | POA: Diagnosis not present

## 2021-06-26 DIAGNOSIS — G8929 Other chronic pain: Secondary | ICD-10-CM | POA: Diagnosis not present

## 2021-06-26 DIAGNOSIS — K5900 Constipation, unspecified: Secondary | ICD-10-CM | POA: Diagnosis not present

## 2021-06-26 DIAGNOSIS — Z6839 Body mass index (BMI) 39.0-39.9, adult: Secondary | ICD-10-CM | POA: Diagnosis not present

## 2021-06-26 DIAGNOSIS — F325 Major depressive disorder, single episode, in full remission: Secondary | ICD-10-CM | POA: Diagnosis not present

## 2021-06-26 DIAGNOSIS — M1990 Unspecified osteoarthritis, unspecified site: Secondary | ICD-10-CM | POA: Diagnosis not present

## 2021-07-03 ENCOUNTER — Other Ambulatory Visit: Payer: Self-pay

## 2021-07-03 MED ORDER — POMALIDOMIDE 2 MG PO CAPS: ORAL_CAPSULE | ORAL | 0 refills | Status: DC

## 2021-07-04 ENCOUNTER — Other Ambulatory Visit: Payer: Self-pay | Admitting: Hematology and Oncology

## 2021-07-06 ENCOUNTER — Encounter: Payer: Self-pay | Admitting: Hematology and Oncology

## 2021-07-06 ENCOUNTER — Encounter: Payer: Self-pay | Admitting: Cardiovascular Disease

## 2021-07-20 ENCOUNTER — Other Ambulatory Visit: Payer: Self-pay

## 2021-07-20 MED ORDER — POMALIDOMIDE 2 MG PO CAPS: ORAL_CAPSULE | ORAL | 0 refills | Status: DC

## 2021-07-24 ENCOUNTER — Other Ambulatory Visit: Payer: Self-pay

## 2021-07-24 ENCOUNTER — Inpatient Hospital Stay: Payer: Medicare PPO | Attending: Hematology and Oncology

## 2021-07-24 ENCOUNTER — Inpatient Hospital Stay: Payer: Medicare PPO

## 2021-07-24 DIAGNOSIS — C9001 Multiple myeloma in remission: Secondary | ICD-10-CM | POA: Insufficient documentation

## 2021-07-24 LAB — CBC WITH DIFFERENTIAL/PLATELET
Abs Immature Granulocytes: 0.02 10*3/uL (ref 0.00–0.07)
Basophils Absolute: 0.1 10*3/uL (ref 0.0–0.1)
Basophils Relative: 3 %
Eosinophils Absolute: 0.2 10*3/uL (ref 0.0–0.5)
Eosinophils Relative: 5 %
HCT: 40 % (ref 36.0–46.0)
Hemoglobin: 12.9 g/dL (ref 12.0–15.0)
Immature Granulocytes: 1 %
Lymphocytes Relative: 30 %
Lymphs Abs: 1.1 10*3/uL (ref 0.7–4.0)
MCH: 29.3 pg (ref 26.0–34.0)
MCHC: 32.3 g/dL (ref 30.0–36.0)
MCV: 90.9 fL (ref 80.0–100.0)
Monocytes Absolute: 0.3 10*3/uL (ref 0.1–1.0)
Monocytes Relative: 9 %
Neutro Abs: 1.9 10*3/uL (ref 1.7–7.7)
Neutrophils Relative %: 52 %
Platelets: 239 10*3/uL (ref 150–400)
RBC: 4.4 MIL/uL (ref 3.87–5.11)
RDW: 15.8 % — ABNORMAL HIGH (ref 11.5–15.5)
WBC: 3.5 10*3/uL — ABNORMAL LOW (ref 4.0–10.5)
nRBC: 0 % (ref 0.0–0.2)

## 2021-07-24 LAB — COMPREHENSIVE METABOLIC PANEL
ALT: 16 U/L (ref 0–44)
AST: 19 U/L (ref 15–41)
Albumin: 3.9 g/dL (ref 3.5–5.0)
Alkaline Phosphatase: 73 U/L (ref 38–126)
Anion gap: 3 — ABNORMAL LOW (ref 5–15)
BUN: 20 mg/dL (ref 8–23)
CO2: 33 mmol/L — ABNORMAL HIGH (ref 22–32)
Calcium: 9.3 mg/dL (ref 8.9–10.3)
Chloride: 103 mmol/L (ref 98–111)
Creatinine, Ser: 1.24 mg/dL — ABNORMAL HIGH (ref 0.44–1.00)
GFR, Estimated: 47 mL/min — ABNORMAL LOW (ref >60)
Glucose, Bld: 78 mg/dL (ref 70–99)
Potassium: 4 mmol/L (ref 3.5–5.1)
Sodium: 139 mmol/L (ref 135–145)
Total Bilirubin: 0.5 mg/dL (ref 0.3–1.2)
Total Protein: 6.6 g/dL (ref 6.5–8.1)

## 2021-07-27 LAB — KAPPA/LAMBDA LIGHT CHAINS
Kappa free light chain: 32.7 mg/L — ABNORMAL HIGH (ref 3.3–19.4)
Kappa, lambda light chain ratio: 1.26 (ref 0.26–1.65)
Lambda free light chains: 25.9 mg/L (ref 5.7–26.3)

## 2021-07-27 LAB — MULTIPLE MYELOMA PANEL, SERUM
Albumin SerPl Elph-Mcnc: 3.4 g/dL (ref 2.9–4.4)
Albumin/Glob SerPl: 1.2 (ref 0.7–1.7)
Alpha 1: 0.2 g/dL (ref 0.0–0.4)
Alpha2 Glob SerPl Elph-Mcnc: 0.8 g/dL (ref 0.4–1.0)
B-Globulin SerPl Elph-Mcnc: 0.9 g/dL (ref 0.7–1.3)
Gamma Glob SerPl Elph-Mcnc: 0.9 g/dL (ref 0.4–1.8)
Globulin, Total: 2.9 g/dL (ref 2.2–3.9)
IgA: 167 mg/dL (ref 87–352)
IgG (Immunoglobin G), Serum: 918 mg/dL (ref 586–1602)
IgM (Immunoglobulin M), Srm: 20 mg/dL — ABNORMAL LOW (ref 26–217)
Total Protein ELP: 6.3 g/dL (ref 6.0–8.5)

## 2021-07-29 ENCOUNTER — Other Ambulatory Visit: Payer: Self-pay | Admitting: Hematology and Oncology

## 2021-07-31 ENCOUNTER — Encounter: Payer: Self-pay | Admitting: Hematology and Oncology

## 2021-07-31 ENCOUNTER — Inpatient Hospital Stay: Payer: Medicare PPO | Admitting: Hematology and Oncology

## 2021-07-31 ENCOUNTER — Other Ambulatory Visit: Payer: Self-pay

## 2021-07-31 DIAGNOSIS — T451X5A Adverse effect of antineoplastic and immunosuppressive drugs, initial encounter: Secondary | ICD-10-CM

## 2021-07-31 DIAGNOSIS — R7989 Other specified abnormal findings of blood chemistry: Secondary | ICD-10-CM

## 2021-07-31 DIAGNOSIS — D701 Agranulocytosis secondary to cancer chemotherapy: Secondary | ICD-10-CM | POA: Diagnosis not present

## 2021-07-31 DIAGNOSIS — C9001 Multiple myeloma in remission: Secondary | ICD-10-CM

## 2021-07-31 NOTE — Assessment & Plan Note (Signed)
This is likely due to recent treatment. The patient denies recent history of fevers, cough, chills, diarrhea or dysuria. She is asymptomatic from the leukopenia. I will observe for now.    

## 2021-07-31 NOTE — Progress Notes (Signed)
? ?HEMATOLOGY-ONCOLOGY ELECTRONIC VISIT PROGRESS NOTE ? ?Patient Care Team: ?Lucianne Lei, MD as PCP - General (Family Medicine) ?Jodi Marble, MD as Consulting Physician (Otolaryngology) ?Heath Lark, MD as Consulting Physician (Hematology and Oncology) ? ?I connected with the patient via telephone conference and verified that I am speaking with the correct person using two identifiers. The patient's location is at home and I am providing care from the [facility] ?I discussed the limitations, risks, security and privacy concerns of performing an evaluation and management service by e-visits and the availability of in person appointments.  ?I also discussed with the patient that there may be a patient responsible charge related to this service. The patient expressed understanding and agreed to proceed.  ? ?ASSESSMENT & PLAN:  ?Multiple myeloma in remission ([place]) ?She tolerated pomalidomide well ?No recent infection ?Recent myeloma showed complete response.  She will continue treatment as directed ?She has been on Pomalyst for close to 5 years ?We discussed the risk and benefits of discontinuation ?She will continue aspirin for DVT prophylaxis ?Her recent bone density scan is satisfactory; we have discontinued Zometa from previous visit ?She will continue calcium with vitamin D and I also recommend acyclovir for antiviral prophylaxis ? ?Leukopenia due to antineoplastic chemotherapy ([place]) ?This is likely due to recent treatment. The patient denies recent history of fevers, cough, chills, diarrhea or dysuria. She is asymptomatic from the leukopenia. I will observe for now.  ? ? ?Elevated serum creatinine ?Her recent elevated serum creatinine could be due to dehydration ?This is not from multiple myeloma ?She is reassured ?We discussed importance of adequate hydration before next blood draw ? ?No orders of the defined types were placed in this encounter. ? ? ?INTERVAL HISTORY: ?Please see below for problem  oriented charting. ?The purpose of today's discussion is to review recent test results ?The patient had myeloma, on single agent Pomalyst ?She is doing very well ?No new side effects of treatment ?No recent infection ? ?SUMMARY OF ONCOLOGIC HISTORY: ?Oncology History  ?Multiple myeloma in remission ([place])  ?03/22/2016 Bone Marrow Biopsy  ? Bone Marrow Biopsy: Plasma cells 17% by Aspirate and 30% by CD 138 stain. Plasma cell neoplasm, Kappa Restricted ?Normal cytogenetics, FISH positive for +11 and +14 and +7 ?  ?04/13/2016 Imaging  ? Skeletal survey showed lucencies within the calvarium worrisome for myeloma. No definite abnormal lytic or blastic lesions are observed ?elsewhere. ?  ?04/15/2016 Cancer Staging  ? Staging form: Multiple Myeloma, AJCC 6th Edition ?- Clinical stage from 04/15/2016: Stage IIA - Signed by Heath Lark, MD on 07/31/2021 ?Staged by: Managing physician ?Diagnostic confirmation: Positive histology ?Specimen type: Biopsy / Limited Resection ?Histopathologic type: Neoplasm, malignant ? ?  ?04/19/2016 - 09/10/2016 Chemotherapy  ? The patient consented to treatment with Revlimid, dexamethasone and Velcade ?  ?09/14/2016 Bone Marrow Biopsy  ? In summary, there is a normal cellular marrow with trilineage hematopoiesis.  A mild plasmacytosis is present, but there is no definitive evidence of residual multiple myeloma ?  ?10/12/2016 - 10/12/2016 Chemotherapy  ? She received melphalan as conditioning chemo ?  ?10/13/2016 Bone Marrow Transplant  ? She received autologous stem cell transplant at [facility] ?  ?01/24/2017 Bone Marrow Biopsy  ? BONE MARROW: ?-          Normocellular marrow for age (50%) with trilineage hematopoiesis. ?-     No morphologic or immunohistochemical evidence of involvement by plasma cell neoplasm (see comment) ? ?PERIPHERAL BLOOD: ?-  Unremarkable (see CBC data) ? ?COMMENT: ?The bone marrow is normocellular for age and shows adequate trilineage hematopoiesis without significant  (<10%) dysplastic changes present. Blasts are not increased. Plasma cells represent about 2% of total cells on aspirate smears. Appropriately controlled immunohistochemical stains are performed on the core biopsy. CD138 highlights small interstitial plasma cells comprising approximately 2% of total cells. These plasma cells ?appear polytypic as demonstrated by kappa and lambda in-situ hybridization. ?  ?01/25/2017 PET scan  ? No FDG avid osseous lesions or masses are identified. ?  ?02/18/2017 - 04/01/2017 Chemotherapy  ? She received weekly Velcade, Pomalyst and Dexamethasone ?  ?04/15/2017 -  Chemotherapy  ? The patient had Pomalyst only ?  ?04/20/2017 Bone Marrow Biopsy  ? Bone Marrow (BM) and Peripheral Blood (PB) ?FINAL PATHOLOGIC DIAGNOSIS ?BONE MARROW: Normocellular bone marrow (40%) with normal numbers of megakaryocytes, erythroid hyperplasia and no increase in plasma cells (1%). ?  ? ? ?REVIEW OF SYSTEMS:   ?Constitutional: Denies fevers, chills or abnormal weight loss ?Eyes: Denies blurriness of vision ?Ears, nose, mouth, throat, and face: Denies mucositis or sore throat ?Respiratory: Denies cough, dyspnea or wheezes ?Cardiovascular: Denies palpitation, chest discomfort ?Gastrointestinal:  Denies nausea, heartburn or change in bowel habits ?Skin: Denies abnormal skin rashes ?Lymphatics: Denies new lymphadenopathy or easy bruising ?Neurological:Denies numbness, tingling or new weaknesses ?Behavioral/Psych: Mood is stable, no new changes  ?Extremities: No lower extremity edema ?All other systems were reviewed with the patient and are negative. ? ?I have reviewed the past medical history, past surgical history, social history and family history with the patient and they are unchanged from previous note. ? ?ALLERGIES:  is allergic to revlimid [lenalidomide]. ? ?MEDICATIONS:  ?Current Outpatient Medications  ?Medication Sig Dispense Refill  ? acetaminophen (TYLENOL) 500 MG tablet Take 500 mg by mouth every 6 (six)  hours as needed.    ? acyclovir (ZOVIRAX) 400 MG tablet Take 1 tablet by mouth once daily 90 tablet 0  ? ASPIRIN 81 PO Take 81 mg by mouth daily.    ? Calcium Carbonate-Vitamin D (CALCIUM PLUS VITAMIN D PO) Take 1 tablet daily by mouth.    ? docusate sodium (COLACE) 100 MG capsule Take 200 mg by mouth daily.    ? fluticasone (FLONASE) 50 MCG/ACT nasal spray Place into both nostrils daily as needed for allergies or rhinitis.    ? losartan-hydrochlorothiazide (HYZAAR) 100-25 MG tablet Take 1 tablet by mouth daily. (Patient taking differently: Take 0.5 tablets by mouth daily. TAKES 1/2 TABLET DAILY) 90 tablet 3  ? Multiple Vitamin (MULTIVITAMIN) tablet Take 1 tablet by mouth daily.    ? Polyethylene Glycol 3350 (MIRALAX PO) Take by mouth as needed.    ? pomalidomide (POMALYST) 2 MG capsule TAKE 1 CAPSULE BY MOUTH ONCE DAILY FOR 21 DAYS ON AND 7 DAYS OFF 21 capsule 0  ? rosuvastatin (CRESTOR) 10 MG tablet Take 10 mg by mouth as directed. One tablet before bedtime.    ? ?No current facility-administered medications for this visit.  ? ? ?PHYSICAL EXAMINATION: ?ECOG PERFORMANCE STATUS: 0 - Asymptomatic ? ?LABORATORY DATA:  ?I have reviewed the data as listed ?CMP Latest Ref Rng & Units 07/24/2021 04/27/2021 01/23/2021  ?Glucose 70 - 99 mg/dL 78 80 89  ?BUN 8 - 23 mg/dL _0 ?Creatinine 0.44 - 1.00 mg/dL 1.24(H) 1.07(H) 0.98  ?Sodium 135 - 145 mmol/L 139 142 140  ?Potassium 3.5 - 5.1 mmol/L 4.0 4.0 3.9  ?Chloride 98 - 111 mmol/L 103 105 104  ?  CO2 22 - 32 mmol/L 33(H) 28 26  ?Calcium 8.9 - 10.3 mg/dL 9.3 9.3 9.7  ?Total Protein 6.5 - 8.1 g/dL 6.6 7.3 7.0  ?Total Bilirubin 0.3 - 1.2 mg/dL 0.5 0.6 0.7  ?Alkaline Phos 38 - 126 U/L 73 81 83  ?AST 15 - 41 U/L _0 ?ALT 0 - 44 U/L _1 ? ? ?Lab Results  ?Component Value Date  ? WBC 3.5 (L) 07/24/2021  ? HGB 12.9 07/24/2021  ? HCT 40.0 07/24/2021  ? MCV 90.9 07/24/2021  ? PLT 239 07/24/2021  ? NEUTROABS 1.9 07/24/2021  ? ? ? ?I discussed the assessment and treatment  plan with the patient. The patient was provided an opportunity to ask questions and all were answered. The patient agreed with the plan and demonstrated an understanding of the instructions. The patient was advi

## 2021-07-31 NOTE — Assessment & Plan Note (Signed)
Her recent elevated serum creatinine could be due to dehydration ?This is not from multiple myeloma ?She is reassured ?We discussed importance of adequate hydration before next blood draw ?

## 2021-07-31 NOTE — Assessment & Plan Note (Signed)
She tolerated pomalidomide well ?No recent infection ?Recent myeloma showed complete response.  She will continue treatment as directed ?She has been on Pomalyst for close to 5 years ?We discussed the risk and benefits of discontinuation ?She will continue aspirin for DVT prophylaxis ?Her recent bone density scan is satisfactory; we have discontinued Zometa from previous visit ?She will continue calcium with vitamin D and I also recommend acyclovir for antiviral prophylaxis ?

## 2021-08-03 ENCOUNTER — Telehealth: Payer: Self-pay | Admitting: Hematology and Oncology

## 2021-08-03 NOTE — Telephone Encounter (Signed)
Scheduled appointment per 03/17 los. Patient aware.  ? ?

## 2021-08-13 ENCOUNTER — Other Ambulatory Visit: Payer: Self-pay

## 2021-08-13 MED ORDER — POMALIDOMIDE 2 MG PO CAPS: ORAL_CAPSULE | ORAL | 0 refills | Status: DC

## 2021-08-24 ENCOUNTER — Encounter: Payer: Self-pay | Admitting: Hematology and Oncology

## 2021-08-25 ENCOUNTER — Other Ambulatory Visit: Payer: Self-pay | Admitting: Hematology and Oncology

## 2021-09-28 ENCOUNTER — Other Ambulatory Visit: Payer: Self-pay | Admitting: *Deleted

## 2021-09-28 ENCOUNTER — Other Ambulatory Visit: Payer: Self-pay

## 2021-09-28 DIAGNOSIS — I712 Thoracic aortic aneurysm, without rupture, unspecified: Secondary | ICD-10-CM

## 2021-09-28 DIAGNOSIS — Z01812 Encounter for preprocedural laboratory examination: Secondary | ICD-10-CM

## 2021-09-28 LAB — BASIC METABOLIC PANEL
BUN/Creatinine Ratio: 21 (ref 12–28)
BUN: 23 mg/dL (ref 8–27)
CO2: 27 mmol/L (ref 20–29)
Calcium: 9.6 mg/dL (ref 8.7–10.3)
Chloride: 97 mmol/L (ref 96–106)
Creatinine, Ser: 1.07 mg/dL — ABNORMAL HIGH (ref 0.57–1.00)
Glucose: 89 mg/dL (ref 70–99)
Potassium: 4.4 mmol/L (ref 3.5–5.2)
Sodium: 136 mmol/L (ref 134–144)
eGFR: 56 mL/min/{1.73_m2} — ABNORMAL LOW (ref >59)

## 2021-10-04 ENCOUNTER — Encounter: Payer: Self-pay | Admitting: Hematology and Oncology

## 2021-10-05 ENCOUNTER — Ambulatory Visit (HOSPITAL_COMMUNITY): Payer: Medicare PPO

## 2021-10-05 ENCOUNTER — Ambulatory Visit (HOSPITAL_COMMUNITY)
Admission: RE | Admit: 2021-10-05 | Discharge: 2021-10-05 | Disposition: A | Discharge status: Home / Self Care | Payer: Medicare PPO | Source: Ambulatory Visit | Attending: Cardiovascular Disease | Admitting: Cardiovascular Disease

## 2021-10-05 DIAGNOSIS — I7121 Aneurysm of the ascending aorta, without rupture: Secondary | ICD-10-CM | POA: Diagnosis not present

## 2021-10-05 DIAGNOSIS — I712 Thoracic aortic aneurysm, without rupture, unspecified: Secondary | ICD-10-CM | POA: Diagnosis not present

## 2021-10-05 MED ORDER — IOHEXOL 350 MG/ML SOLN
100.0000 mL | Freq: Once | INTRAVENOUS | Status: AC | PRN
  Administered 2021-10-05: 70 mL via INTRAVENOUS

## 2021-10-06 DIAGNOSIS — E785 Hyperlipidemia, unspecified: Secondary | ICD-10-CM | POA: Diagnosis not present

## 2021-10-06 DIAGNOSIS — E6609 Other obesity due to excess calories: Secondary | ICD-10-CM | POA: Diagnosis not present

## 2021-10-06 DIAGNOSIS — R739 Hyperglycemia, unspecified: Secondary | ICD-10-CM | POA: Diagnosis not present

## 2021-10-06 DIAGNOSIS — Z6839 Body mass index (BMI) 39.0-39.9, adult: Secondary | ICD-10-CM | POA: Diagnosis not present

## 2021-10-06 DIAGNOSIS — I10 Essential (primary) hypertension: Secondary | ICD-10-CM | POA: Diagnosis not present

## 2021-10-06 DIAGNOSIS — C9001 Multiple myeloma in remission: Secondary | ICD-10-CM | POA: Diagnosis not present

## 2021-10-06 DIAGNOSIS — J301 Allergic rhinitis due to pollen: Secondary | ICD-10-CM | POA: Diagnosis not present

## 2021-10-06 DIAGNOSIS — Z0000 Encounter for general adult medical examination without abnormal findings: Secondary | ICD-10-CM | POA: Diagnosis not present

## 2021-10-06 DIAGNOSIS — I712 Thoracic aortic aneurysm, without rupture, unspecified: Secondary | ICD-10-CM | POA: Diagnosis not present

## 2021-10-09 DIAGNOSIS — G620 Drug-induced polyneuropathy: Secondary | ICD-10-CM | POA: Diagnosis not present

## 2021-10-09 DIAGNOSIS — Z9484 Stem cells transplant status: Secondary | ICD-10-CM | POA: Diagnosis not present

## 2021-10-09 DIAGNOSIS — R438 Other disturbances of smell and taste: Secondary | ICD-10-CM | POA: Diagnosis not present

## 2021-10-09 DIAGNOSIS — I712 Thoracic aortic aneurysm, without rupture, unspecified: Secondary | ICD-10-CM | POA: Diagnosis not present

## 2021-10-09 DIAGNOSIS — C9001 Multiple myeloma in remission: Secondary | ICD-10-CM | POA: Diagnosis not present

## 2021-10-09 DIAGNOSIS — R430 Anosmia: Secondary | ICD-10-CM | POA: Diagnosis not present

## 2021-10-14 DIAGNOSIS — M130 Polyarthritis, unspecified: Secondary | ICD-10-CM | POA: Diagnosis not present

## 2021-10-14 DIAGNOSIS — E6609 Other obesity due to excess calories: Secondary | ICD-10-CM | POA: Diagnosis not present

## 2021-10-14 DIAGNOSIS — M6530 Trigger finger, unspecified finger: Secondary | ICD-10-CM | POA: Diagnosis not present

## 2021-10-14 DIAGNOSIS — I10 Essential (primary) hypertension: Secondary | ICD-10-CM | POA: Diagnosis not present

## 2021-10-14 DIAGNOSIS — C9001 Multiple myeloma in remission: Secondary | ICD-10-CM | POA: Diagnosis not present

## 2021-10-14 DIAGNOSIS — E785 Hyperlipidemia, unspecified: Secondary | ICD-10-CM | POA: Diagnosis not present

## 2021-10-14 DIAGNOSIS — E782 Mixed hyperlipidemia: Secondary | ICD-10-CM | POA: Diagnosis not present

## 2021-10-15 DIAGNOSIS — M65312 Trigger thumb, left thumb: Secondary | ICD-10-CM | POA: Diagnosis not present

## 2021-10-20 ENCOUNTER — Encounter: Payer: Self-pay | Admitting: Hematology and Oncology

## 2021-10-20 DIAGNOSIS — Z0389 Encounter for observation for other suspected diseases and conditions ruled out: Secondary | ICD-10-CM | POA: Diagnosis not present

## 2021-10-23 ENCOUNTER — Other Ambulatory Visit: Payer: Self-pay

## 2021-10-23 ENCOUNTER — Inpatient Hospital Stay: Payer: Medicare PPO | Attending: Hematology and Oncology

## 2021-10-23 DIAGNOSIS — E6609 Other obesity due to excess calories: Secondary | ICD-10-CM | POA: Diagnosis not present

## 2021-10-23 DIAGNOSIS — C9001 Multiple myeloma in remission: Secondary | ICD-10-CM

## 2021-10-23 DIAGNOSIS — Z6839 Body mass index (BMI) 39.0-39.9, adult: Secondary | ICD-10-CM | POA: Insufficient documentation

## 2021-10-23 DIAGNOSIS — Z79899 Other long term (current) drug therapy: Secondary | ICD-10-CM | POA: Insufficient documentation

## 2021-10-23 DIAGNOSIS — R634 Abnormal weight loss: Secondary | ICD-10-CM | POA: Insufficient documentation

## 2021-10-23 DIAGNOSIS — Z9484 Stem cells transplant status: Secondary | ICD-10-CM | POA: Insufficient documentation

## 2021-10-23 LAB — COMPREHENSIVE METABOLIC PANEL WITH GFR
ALT: 11 U/L (ref 0–44)
AST: 19 U/L (ref 15–41)
Albumin: 4.1 g/dL (ref 3.5–5.0)
Alkaline Phosphatase: 73 U/L (ref 38–126)
Anion gap: 7 (ref 5–15)
BUN: 23 mg/dL (ref 8–23)
CO2: 29 mmol/L (ref 22–32)
Calcium: 9.8 mg/dL (ref 8.9–10.3)
Chloride: 102 mmol/L (ref 98–111)
Creatinine, Ser: 1.07 mg/dL — ABNORMAL HIGH (ref 0.44–1.00)
GFR, Estimated: 56 mL/min — ABNORMAL LOW (ref >60)
Glucose, Bld: 97 mg/dL (ref 70–99)
Potassium: 3.9 mmol/L (ref 3.5–5.1)
Sodium: 138 mmol/L (ref 135–145)
Total Bilirubin: 0.7 mg/dL (ref 0.3–1.2)
Total Protein: 7.2 g/dL (ref 6.5–8.1)

## 2021-10-23 LAB — CBC WITH DIFFERENTIAL/PLATELET
Abs Immature Granulocytes: 0.02 10*3/uL (ref 0.00–0.07)
Basophils Absolute: 0 10*3/uL (ref 0.0–0.1)
Basophils Relative: 0 %
Eosinophils Absolute: 0 10*3/uL (ref 0.0–0.5)
Eosinophils Relative: 1 %
HCT: 39.5 % (ref 36.0–46.0)
Hemoglobin: 13 g/dL (ref 12.0–15.0)
Immature Granulocytes: 0 %
Lymphocytes Relative: 18 %
Lymphs Abs: 0.9 10*3/uL (ref 0.7–4.0)
MCH: 29.3 pg (ref 26.0–34.0)
MCHC: 32.9 g/dL (ref 30.0–36.0)
MCV: 89.2 fL (ref 80.0–100.0)
Monocytes Absolute: 0.4 10*3/uL (ref 0.1–1.0)
Monocytes Relative: 8 %
Neutro Abs: 3.6 10*3/uL (ref 1.7–7.7)
Neutrophils Relative %: 73 %
Platelets: 172 10*3/uL (ref 150–400)
RBC: 4.43 MIL/uL (ref 3.87–5.11)
RDW: 15.2 % (ref 11.5–15.5)
WBC: 5 10*3/uL (ref 4.0–10.5)
nRBC: 0 % (ref 0.0–0.2)

## 2021-10-26 ENCOUNTER — Other Ambulatory Visit: Payer: Self-pay | Admitting: Hematology and Oncology

## 2021-10-26 LAB — KAPPA/LAMBDA LIGHT CHAINS
Kappa free light chain: 20.3 mg/L — ABNORMAL HIGH (ref 3.3–19.4)
Kappa, lambda light chain ratio: 1.6 (ref 0.26–1.65)
Lambda free light chains: 12.7 mg/L (ref 5.7–26.3)

## 2021-10-27 LAB — MULTIPLE MYELOMA PANEL, SERUM
Albumin SerPl Elph-Mcnc: 3.7 g/dL (ref 2.9–4.4)
Albumin/Glob SerPl: 1.2 (ref 0.7–1.7)
Alpha 1: 0.2 g/dL (ref 0.0–0.4)
Alpha2 Glob SerPl Elph-Mcnc: 0.9 g/dL (ref 0.4–1.0)
B-Globulin SerPl Elph-Mcnc: 1.1 g/dL (ref 0.7–1.3)
Gamma Glob SerPl Elph-Mcnc: 0.9 g/dL (ref 0.4–1.8)
Globulin, Total: 3.1 g/dL (ref 2.2–3.9)
IgA: 167 mg/dL (ref 87–352)
IgG (Immunoglobin G), Serum: 1114 mg/dL (ref 586–1602)
IgM (Immunoglobulin M), Srm: 36 mg/dL (ref 26–217)
Total Protein ELP: 6.8 g/dL (ref 6.0–8.5)

## 2021-10-29 ENCOUNTER — Other Ambulatory Visit: Payer: Self-pay | Admitting: Hematology and Oncology

## 2021-10-29 ENCOUNTER — Encounter: Payer: Self-pay | Admitting: Hematology and Oncology

## 2021-10-30 ENCOUNTER — Inpatient Hospital Stay: Payer: Medicare PPO | Admitting: Hematology and Oncology

## 2021-10-30 ENCOUNTER — Encounter: Payer: Self-pay | Admitting: Hematology and Oncology

## 2021-10-30 DIAGNOSIS — C9001 Multiple myeloma in remission: Secondary | ICD-10-CM

## 2021-10-30 DIAGNOSIS — R634 Abnormal weight loss: Secondary | ICD-10-CM | POA: Diagnosis not present

## 2021-10-30 DIAGNOSIS — Z6839 Body mass index (BMI) 39.0-39.9, adult: Secondary | ICD-10-CM

## 2021-10-30 DIAGNOSIS — Z79899 Other long term (current) drug therapy: Secondary | ICD-10-CM | POA: Diagnosis not present

## 2021-10-30 DIAGNOSIS — Z9484 Stem cells transplant status: Secondary | ICD-10-CM | POA: Diagnosis not present

## 2021-10-30 DIAGNOSIS — E6609 Other obesity due to excess calories: Secondary | ICD-10-CM | POA: Diagnosis not present

## 2021-10-30 NOTE — Assessment & Plan Note (Signed)
The patient is motivated and will continue her weight loss journey I encouraged her and congratulated her effort

## 2021-10-30 NOTE — Progress Notes (Signed)
HEMATOLOGY-ONCOLOGY ELECTRONIC VISIT PROGRESS NOTE  Patient Care Team: Lucianne Lei, MD as PCP - General (Family Medicine) Jodi Marble, MD as Consulting Physician (Otolaryngology) Heath Lark, MD as Consulting Physician (Hematology and Oncology)  I connected with the patient via telephone conference and verified that I am speaking with the correct person using two identifiers. The patient's location is at home and I am providing care from the [facility] I discussed the limitations, risks, security and privacy concerns of performing an evaluation and management service by e-visits and the availability of in person appointments.  I also discussed with the patient that there may be a patient responsible charge related to this service. The patient expressed understanding and agreed to proceed.   ASSESSMENT & PLAN:  Multiple myeloma in remission [facility]) She discontinued taking Pomalyst mid April Recent myeloma showed complete response.   Her recent bone density scan is satisfactory; we have discontinued Zometa from previous visit She will continue calcium with vitamin D She may discontinue acyclovir We will continue follow-up every 3 months for the first 2 years after discontinuation of Pomalyst  Class 2 obesity due to excess calories with body mass index (BMI) of 39.0 to 39.9 in adult The patient is motivated and will continue her weight loss journey I encouraged her and congratulated her effort  No orders of the defined types were placed in this encounter.   INTERVAL HISTORY: Please see below for problem oriented charting. The purpose of today's discussion is follow-up on history of multiple myeloma She is doing well No recent infection no bone pain She is motivated to continue her weight loss journey  SUMMARY OF ONCOLOGIC HISTORY: Oncology History  Multiple myeloma in remission ([place])  03/22/2016 Bone Marrow Biopsy   Bone Marrow Biopsy: Plasma cells 17% by Aspirate and 30%  by CD 138 stain. Plasma cell neoplasm, Kappa Restricted Normal cytogenetics, FISH positive for +11 and +14 and +7   04/13/2016 Imaging   Skeletal survey showed lucencies within the calvarium worrisome for myeloma. No definite abnormal lytic or blastic lesions are observed elsewhere.   04/15/2016 Cancer Staging   Staging form: Multiple Myeloma, AJCC 6th Edition - Clinical stage from 04/15/2016: Stage IIA - Signed by Heath Lark, MD on 07/31/2021 Staged by: Managing physician Diagnostic confirmation: Positive histology Specimen type: Biopsy / Limited Resection Histopathologic type: Neoplasm, malignant   04/19/2016 - 09/10/2016 Chemotherapy   The patient consented to treatment with Revlimid, dexamethasone and Velcade   09/14/2016 Bone Marrow Biopsy   In summary, there is a normal cellular marrow with trilineage hematopoiesis.  A mild plasmacytosis is present, but there is no definitive evidence of residual multiple myeloma   10/12/2016 - 10/12/2016 Chemotherapy   She received melphalan as conditioning chemo   10/13/2016 Bone Marrow Transplant   She received [facility] stem [facility]   01/24/2017 Bone Marrow Biopsy   BONE MARROW: -          Normocellular marrow for age (50%) with trilineage hematopoiesis. -     No morphologic or immunohistochemical evidence of involvement by plasma cell neoplasm (see comment)  PERIPHERAL BLOOD: -          Unremarkable (see CBC data)  COMMENT: The bone marrow is normocellular for age and shows adequate trilineage hematopoiesis without significant (<10%) dysplastic changes present. Blasts are not increased. Plasma cells represent about 2% of total cells on aspirate smears. Appropriately controlled immunohistochemical stains are performed on the core biopsy. CD138 highlights small interstitial plasma  cells comprising approximately 2% of total cells. These plasma cells appear polytypic as demonstrated by kappa and lambda in-situ  hybridization.   01/25/2017 PET scan   No FDG avid osseous lesions or masses are identified.   02/18/2017 - 04/01/2017 Chemotherapy   She received weekly Velcade, Pomalyst and Dexamethasone   04/15/2017 - 08/28/2021 Chemotherapy   The patient had Pomalyst only   04/20/2017 Bone Marrow Biopsy   Bone Marrow (BM) and Peripheral Blood (PB) FINAL PATHOLOGIC DIAGNOSIS BONE MARROW: Normocellular bone marrow (40%) with normal numbers of megakaryocytes, erythroid hyperplasia and no increase in plasma cells (1%).   10/20/2021 Imaging   Bone density is normal     REVIEW OF SYSTEMS:   Constitutional: Denies fevers, chills or abnormal weight loss Eyes: Denies blurriness of vision Ears, nose, mouth, throat, and face: Denies mucositis or sore throat Respiratory: Denies cough, dyspnea or wheezes Cardiovascular: Denies palpitation, chest discomfort Gastrointestinal:  Denies nausea, heartburn or change in bowel habits Skin: Denies abnormal skin rashes Lymphatics: Denies new lymphadenopathy or easy bruising Neurological:Denies numbness, tingling or new weaknesses Behavioral/Psych: Mood is stable, no new changes  Extremities: No lower extremity edema All other systems were reviewed with the patient and are negative.  I have reviewed the past medical history, past surgical history, social history and family history with the patient and they are unchanged from previous note.  ALLERGIES:  is allergic to revlimid [lenalidomide].  MEDICATIONS:  Current Outpatient Medications  Medication Sig Dispense Refill   acetaminophen (TYLENOL) 500 MG tablet Take 500 mg by mouth every 6 (six) hours as needed.     ASPIRIN 81 PO Take 81 mg by mouth daily.     Calcium Carbonate-Vitamin D (CALCIUM PLUS VITAMIN D PO) Take 1 tablet daily by mouth.     docusate sodium (COLACE) 100 MG capsule Take 200 mg by mouth daily.     fluticasone (FLONASE) 50 MCG/ACT nasal spray Place into both nostrils daily as needed for allergies  or rhinitis.     losartan-hydrochlorothiazide (HYZAAR) 100-25 MG tablet Take 1 tablet by mouth daily. (Patient taking differently: Take 0.5 tablets by mouth daily. TAKES 1/2 TABLET DAILY) 90 tablet 3   montelukast (SINGULAIR) 10 MG tablet      Multiple Vitamin (MULTIVITAMIN) tablet Take 1 tablet by mouth daily.     Polyethylene Glycol 3350 (MIRALAX PO) Take by mouth as needed.     rosuvastatin (CRESTOR) 10 MG tablet Take 10 mg by mouth as directed. One tablet before bedtime.     No current facility-administered medications for this visit.    PHYSICAL EXAMINATION: ECOG PERFORMANCE STATUS: 0 - Asymptomatic  LABORATORY DATA:  I have reviewed the data as listed    Latest Ref Rng & Units 10/23/2021    9:26 AM 09/28/2021    9:34 AM 07/24/2021   11:51 AM  CMP  Glucose 70 - 99 mg/dL 97  89  78   BUN 8 - 23 mg/dL _0 Creatinine 0.44 - 1.00 mg/dL 1.07  1.07  1.24   Sodium 135 - 145 mmol/L 138  136  139   Potassium 3.5 - 5.1 mmol/L 3.9  4.4  4.0   Chloride 98 - 111 mmol/L 102  97  103   CO2 22 - 32 mmol/L 29  27  33   Calcium 8.9 - 10.3 mg/dL 9.8  9.6  9.3   Total Protein 6.5 - 8.1 g/dL 7.2   6.6   Total Bilirubin 0.3 -  1.2 mg/dL 0.7   0.5   Alkaline Phos 38 - 126 U/L 73   73   AST 15 - 41 U/L 19   19   ALT 0 - 44 U/L 11   16     Lab Results  Component Value Date   WBC 5.0 10/23/2021   HGB 13.0 10/23/2021   HCT 39.5 10/23/2021   MCV 89.2 10/23/2021   PLT 172 10/23/2021   NEUTROABS 3.6 10/23/2021   I discussed the assessment and treatment plan with the patient. The patient was provided an opportunity to ask questions and all were answered. The patient agreed with the plan and demonstrated an understanding of the instructions. The patient was advised to call back or seek an in-person evaluation if the symptoms worsen or if the condition fails to improve as anticipated.    I spent 20 minutes for the appointment reviewing test results, discuss management and coordination of  care.  Heath Lark, MD 10/30/2021 2:10 PM

## 2021-10-30 NOTE — Assessment & Plan Note (Signed)
She discontinued taking Pomalyst mid April Recent myeloma showed complete response.   Her recent bone density scan is satisfactory; we have discontinued Zometa from previous visit She will continue calcium with vitamin D She may discontinue acyclovir We will continue follow-up every 3 months for the first 2 years after discontinuation of Pomalyst

## 2021-11-03 DIAGNOSIS — M65331 Trigger finger, right middle finger: Secondary | ICD-10-CM | POA: Diagnosis not present

## 2021-11-03 DIAGNOSIS — M65341 Trigger finger, right ring finger: Secondary | ICD-10-CM | POA: Diagnosis not present

## 2021-11-03 DIAGNOSIS — M79642 Pain in left hand: Secondary | ICD-10-CM | POA: Diagnosis not present

## 2021-11-03 DIAGNOSIS — M65342 Trigger finger, left ring finger: Secondary | ICD-10-CM | POA: Diagnosis not present

## 2021-11-03 DIAGNOSIS — M79641 Pain in right hand: Secondary | ICD-10-CM | POA: Diagnosis not present

## 2021-11-12 DIAGNOSIS — M25561 Pain in right knee: Secondary | ICD-10-CM | POA: Diagnosis not present

## 2021-11-12 DIAGNOSIS — M1711 Unilateral primary osteoarthritis, right knee: Secondary | ICD-10-CM | POA: Diagnosis not present

## 2021-11-12 DIAGNOSIS — M170 Bilateral primary osteoarthritis of knee: Secondary | ICD-10-CM | POA: Diagnosis not present

## 2021-11-12 DIAGNOSIS — M25562 Pain in left knee: Secondary | ICD-10-CM | POA: Diagnosis not present

## 2021-11-19 DIAGNOSIS — M25562 Pain in left knee: Secondary | ICD-10-CM | POA: Diagnosis not present

## 2021-11-19 DIAGNOSIS — M1712 Unilateral primary osteoarthritis, left knee: Secondary | ICD-10-CM | POA: Diagnosis not present

## 2021-11-20 DIAGNOSIS — M1711 Unilateral primary osteoarthritis, right knee: Secondary | ICD-10-CM | POA: Diagnosis not present

## 2021-11-20 DIAGNOSIS — M25561 Pain in right knee: Secondary | ICD-10-CM | POA: Diagnosis not present

## 2021-11-25 DIAGNOSIS — M25562 Pain in left knee: Secondary | ICD-10-CM | POA: Diagnosis not present

## 2021-11-25 DIAGNOSIS — M1712 Unilateral primary osteoarthritis, left knee: Secondary | ICD-10-CM | POA: Diagnosis not present

## 2021-11-26 DIAGNOSIS — M1711 Unilateral primary osteoarthritis, right knee: Secondary | ICD-10-CM | POA: Diagnosis not present

## 2021-11-26 DIAGNOSIS — M25561 Pain in right knee: Secondary | ICD-10-CM | POA: Diagnosis not present

## 2021-12-01 DIAGNOSIS — M65341 Trigger finger, right ring finger: Secondary | ICD-10-CM | POA: Diagnosis not present

## 2021-12-01 DIAGNOSIS — M79642 Pain in left hand: Secondary | ICD-10-CM | POA: Diagnosis not present

## 2021-12-01 DIAGNOSIS — M25562 Pain in left knee: Secondary | ICD-10-CM | POA: Diagnosis not present

## 2021-12-01 DIAGNOSIS — M65342 Trigger finger, left ring finger: Secondary | ICD-10-CM | POA: Diagnosis not present

## 2021-12-01 DIAGNOSIS — M1712 Unilateral primary osteoarthritis, left knee: Secondary | ICD-10-CM | POA: Diagnosis not present

## 2021-12-01 DIAGNOSIS — M65331 Trigger finger, right middle finger: Secondary | ICD-10-CM | POA: Diagnosis not present

## 2021-12-01 DIAGNOSIS — M79641 Pain in right hand: Secondary | ICD-10-CM | POA: Diagnosis not present

## 2021-12-03 DIAGNOSIS — M25561 Pain in right knee: Secondary | ICD-10-CM | POA: Diagnosis not present

## 2021-12-03 DIAGNOSIS — M1711 Unilateral primary osteoarthritis, right knee: Secondary | ICD-10-CM | POA: Diagnosis not present

## 2021-12-13 ENCOUNTER — Encounter: Payer: Self-pay | Admitting: Hematology and Oncology

## 2021-12-15 DIAGNOSIS — M1712 Unilateral primary osteoarthritis, left knee: Secondary | ICD-10-CM | POA: Diagnosis not present

## 2021-12-15 DIAGNOSIS — M25562 Pain in left knee: Secondary | ICD-10-CM | POA: Diagnosis not present

## 2021-12-23 ENCOUNTER — Encounter (INDEPENDENT_AMBULATORY_CARE_PROVIDER_SITE_OTHER): Payer: Self-pay

## 2021-12-28 DIAGNOSIS — Z0289 Encounter for other administrative examinations: Secondary | ICD-10-CM

## 2022-01-05 ENCOUNTER — Encounter: Payer: Self-pay | Admitting: Hematology and Oncology

## 2022-01-07 ENCOUNTER — Encounter (INDEPENDENT_AMBULATORY_CARE_PROVIDER_SITE_OTHER): Payer: Self-pay | Admitting: Bariatrics

## 2022-01-07 ENCOUNTER — Ambulatory Visit (INDEPENDENT_AMBULATORY_CARE_PROVIDER_SITE_OTHER): Payer: Medicare PPO | Admitting: Bariatrics

## 2022-01-07 VITALS — BP 138/82 | HR 57 | Temp 97.6°F | Ht 68.0 in | Wt 268.0 lb

## 2022-01-07 DIAGNOSIS — K5909 Other constipation: Secondary | ICD-10-CM | POA: Diagnosis not present

## 2022-01-07 DIAGNOSIS — E785 Hyperlipidemia, unspecified: Secondary | ICD-10-CM | POA: Diagnosis not present

## 2022-01-07 DIAGNOSIS — Z6841 Body Mass Index (BMI) 40.0 and over, adult: Secondary | ICD-10-CM | POA: Insufficient documentation

## 2022-01-07 DIAGNOSIS — Z1331 Encounter for screening for depression: Secondary | ICD-10-CM

## 2022-01-07 DIAGNOSIS — E559 Vitamin D deficiency, unspecified: Secondary | ICD-10-CM | POA: Diagnosis not present

## 2022-01-07 DIAGNOSIS — I10 Essential (primary) hypertension: Secondary | ICD-10-CM | POA: Insufficient documentation

## 2022-01-07 DIAGNOSIS — R5383 Other fatigue: Secondary | ICD-10-CM

## 2022-01-07 DIAGNOSIS — R0602 Shortness of breath: Secondary | ICD-10-CM | POA: Insufficient documentation

## 2022-01-07 DIAGNOSIS — E8881 Metabolic syndrome: Secondary | ICD-10-CM | POA: Diagnosis not present

## 2022-01-07 DIAGNOSIS — E669 Obesity, unspecified: Secondary | ICD-10-CM | POA: Diagnosis not present

## 2022-01-09 LAB — LIPID PANEL
Chol/HDL Ratio: 2.2 ratio (ref 0.0–4.4)
Cholesterol, Total: 159 mg/dL (ref 100–199)
HDL: 72 mg/dL (ref >39)
LDL Chol Calc (NIH): 75 mg/dL (ref 0–99)
Triglycerides: 59 mg/dL (ref 0–149)
VLDL Cholesterol Cal: 12 mg/dL (ref 5–40)

## 2022-01-09 LAB — COMPREHENSIVE METABOLIC PANEL
ALT: 17 IU/L (ref 0–32)
AST: 26 IU/L (ref 0–40)
Albumin/Globulin Ratio: 1.6 (ref 1.2–2.2)
Albumin: 4.1 g/dL (ref 3.9–4.9)
Alkaline Phosphatase: 86 IU/L (ref 44–121)
BUN/Creatinine Ratio: 16 (ref 12–28)
BUN: 15 mg/dL (ref 8–27)
Bilirubin Total: 0.9 mg/dL (ref 0.0–1.2)
CO2: 24 mmol/L (ref 20–29)
Calcium: 9.1 mg/dL (ref 8.7–10.3)
Chloride: 103 mmol/L (ref 96–106)
Creatinine, Ser: 0.91 mg/dL (ref 0.57–1.00)
Globulin, Total: 2.5 g/dL (ref 1.5–4.5)
Glucose: 89 mg/dL (ref 70–99)
Potassium: 4.8 mmol/L (ref 3.5–5.2)
Sodium: 143 mmol/L (ref 134–144)
Total Protein: 6.6 g/dL (ref 6.0–8.5)
eGFR: 68 mL/min/{1.73_m2} (ref >59)

## 2022-01-09 LAB — TSH+T4F+T3FREE
Free T4: 1.24 ng/dL (ref 0.82–1.77)
T3, Free: 2.6 pg/mL (ref 2.0–4.4)
TSH: 1.48 u[IU]/mL (ref 0.450–4.500)

## 2022-01-09 LAB — INSULIN, RANDOM: INSULIN: 6.7 u[IU]/mL (ref 2.6–24.9)

## 2022-01-09 LAB — HEMOGLOBIN A1C
Est. average glucose Bld gHb Est-mCnc: 123 mg/dL
Hgb A1c MFr Bld: 5.9 % — ABNORMAL HIGH (ref 4.8–5.6)

## 2022-01-09 LAB — VITAMIN D 25 HYDROXY (VIT D DEFICIENCY, FRACTURES): Vit D, 25-Hydroxy: 75.5 ng/mL (ref 30.0–100.0)

## 2022-01-11 ENCOUNTER — Encounter: Payer: Self-pay | Admitting: Hematology and Oncology

## 2022-01-11 ENCOUNTER — Encounter (INDEPENDENT_AMBULATORY_CARE_PROVIDER_SITE_OTHER): Payer: Self-pay | Admitting: Bariatrics

## 2022-01-12 ENCOUNTER — Other Ambulatory Visit: Payer: Self-pay | Admitting: Hematology and Oncology

## 2022-01-12 DIAGNOSIS — C9001 Multiple myeloma in remission: Secondary | ICD-10-CM

## 2022-01-12 NOTE — Progress Notes (Unsigned)
Chief Complaint:   OBESITY Carolyn Newman (MR# [id number]) is a 71 y.o. female who presents for evaluation and treatment of obesity and related comorbidities. Current BMI is Body mass index is 40.75 kg/m. Carolyn Newman has been struggling with her weight for many years and has been unsuccessful in either losing weight, maintaining weight loss, or reaching her healthy weight goal.  Carolyn Newman seldomly likes to cook due to loss of taste and smell.  She states that she craves sweets and salty snacks.  She has worked with Dr. Juleen China.  Carolyn Newman is currently in the action stage of change and ready to dedicate time achieving and maintaining a healthier weight. Carolyn Newman is interested in becoming our patient and working on intensive lifestyle modifications including (but not limited to) diet and exercise for weight loss.  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  Depression Screen Carolyn Newman's Food and Mood (modified PHQ-9) score was 4.     01/07/2022    8:57 AM  Depression screen PHQ 2/9  Decreased Interest 1  Down, Depressed, Hopeless 0  PHQ - 2 Score 1  Altered sleeping 0  Tired, decreased energy 1  Change in appetite 1  Feeling bad or failure about yourself  0  Trouble concentrating 1  Moving slowly or fidgety/restless 0  Suicidal thoughts 0  PHQ-9 Score 4  Difficult doing work/chores Not difficult at all   Subjective:   1. Other fatigue Carolyn Newman admits to daytime somnolence and denies waking up still tired. Patient has a history of symptoms of daytime fatigue. Carolyn Newman generally gets 6 or 8 hours of sleep per night, and states that she has generally restful sleep. Snoring is present.  Apneic episodes are not present. Epworth Sleepiness Score is 6.   2. SOB (shortness of breath) on exertion Carolyn Newman notes increasing shortness of breath with exercising and seems to be worsening over time with weight gain. She notes getting out of breath sooner with activity than she used to. This has not gotten worse recently. Carolyn Newman denies shortness of breath at rest or orthopnea.  3. Other constipation Carolyn Newman notes chronic symptoms of constipation, and she is taking Colace.  4. Vitamin D deficiency Carolyn Newman is currently taking vitamin D supplementation.  5. Essential hypertension Carolyn Newman is taking Hyzaar, and her blood pressure is controlled.  6. Hyperlipidemia, unspecified hyperlipidemia type Carolyn Newman is taking Crestor, and she denies myalgias.  7. Metabolic syndrome Carolyn Newman is not on medications currently.  She has been cutting back on her carbohydrates.  Assessment/Plan:   1. Other fatigue Carolyn Newman does feel that her weight is causing her energy to be lower than it should be. Fatigue may be related to obesity, depression or many other causes. Labs will be ordered, and in the meanwhile, Carolyn Newman will focus on self care including making healthy food choices, increasing physical activity and focusing on stress reduction.  - TSH+T4F+T3Free - Comprehensive metabolic panel  2. SOB (shortness of breath) on exertion Carolyn Newman does feel that she gets out of breath more easily that she used to when she exercises. Carolyn Newman's shortness of breath appears to be obesity related and exercise induced. She has agreed to work on weight loss and gradually increase exercise to treat her exercise  induced shortness of breath. Will continue to monitor closely.  - TSH+T4F+T3Free - Comprehensive metabolic panel  3. Other constipation Carolyn Newman will continue Colace, and she will work on increasing her water intake.  4. Vitamin D deficiency We will check labs today, and Carolyn Newman will continue her vitamin D  supplementation.  - VITAMIN D 25 Hydroxy (Vit-D Deficiency, Fractures)  5. Essential hypertension Carolyn Newman will continue her Hyzaar as directed.  6. Hyperlipidemia, unspecified hyperlipidemia type We will check labs today, and Carolyn Newman will continue her medications as directed.  - Lipid panel  7. Metabolic syndrome We will check labs today, and we will follow-up at Carolyn Newman's next visit.  - Insulin, random - Hemoglobin A1c  8. Depression screening Carolyn Newman had a negative depression screening. Depression is commonly associated with obesity and often results in emotional eating behaviors. We will monitor this closely and work on CBT to help improve the non-hunger eating patterns. Referral to Psychology may be required if no improvement is seen as she continues in our clinic.  9. Obesity, Current BMI 40.8 Carolyn Newman is currently in the action stage of change and her goal is to continue with weight loss efforts. I recommend Carolyn Newman begin the structured treatment plan as follows:  She has agreed to the Category 2 Plan.  Exercise goals: Exercise bike.  Behavioral modification strategies: increasing lean protein intake, decreasing simple carbohydrates, increasing vegetables, increasing water intake, decreasing eating out, no skipping meals, meal planning and cooking strategies, and keeping healthy foods in the home.  She was informed of the importance of frequent follow-up visits to maximize her success with intensive lifestyle modifications for her multiple health conditions. She was informed we would discuss her lab results at her next visit unless there is a critical issue that needs to be addressed sooner. Carolyn Newman agreed to keep her next visit at the agreed upon time to discuss these results.  Objective:   Blood pressure 138/82, pulse (!) 57, temperature 97.6 F (36.4 C), height '5\' 8"'$  (1.727 m), weight 268 lb (121.6 kg), SpO2 99 %. Body mass index is 40.75 kg/m.  EKG: Normal sinus rhythm,  rate (unable to obtain).  Indirect Calorimeter completed today shows a VO2 of 220 and a REE of 1512.  Her calculated basal metabolic rate is 2774 thus her basal metabolic rate is worse than expected.  General: Cooperative, alert, well developed, in no acute distress. HEENT: Conjunctivae and lids unremarkable. Cardiovascular: Regular rhythm.  Lungs: Normal work of breathing. Neurologic: No focal deficits.   Lab Results  Component Value Date   CREATININE 0.91 01/07/2022   BUN 15 01/07/2022   NA 143 01/07/2022   K 4.8 01/07/2022   CL 103 01/07/2022   CO2 24 01/07/2022   Lab Results  Component Value Date   ALT 17 01/07/2022   AST 26 01/07/2022   ALKPHOS 86 01/07/2022   BILITOT 0.9 01/07/2022   Lab Results  Component Value Date   HGBA1C 5.9 (H) 01/07/2022   HGBA1C 5.6 11/27/2019   Lab Results  Component Value Date   INSULIN 6.7 01/07/2022   INSULIN 9.2 11/27/2019   Lab Results  Component Value Date   TSH 1.480 01/07/2022   Lab Results  Component Value Date   CHOL 159 01/07/2022   HDL 72 01/07/2022   LDLCALC 75 01/07/2022   TRIG 59 01/07/2022   CHOLHDL 2.2 01/07/2022   Lab Results  Component Value Date   WBC 5.0 10/23/2021   HGB 13.0 10/23/2021   HCT 39.5 10/23/2021  MCV 89.2 10/23/2021   PLT 172 10/23/2021   Lab Results  Component Value Date   IRON 108 11/27/2019   TIBC 250 11/27/2019   FERRITIN 99 11/27/2019   Attestation Statements:   Reviewed by clinician on day of visit: allergies, medications, problem list, medical history, surgical history, family history, social history, and previous encounter notes.   Wilhemena Durie, am acting as Location manager for [employer], DO.  I have reviewed the above documentation for accuracy and completeness, and I agree with the above. Jearld Lesch, DO

## 2022-01-14 ENCOUNTER — Encounter (INDEPENDENT_AMBULATORY_CARE_PROVIDER_SITE_OTHER): Payer: Self-pay | Admitting: Bariatrics

## 2022-01-15 ENCOUNTER — Inpatient Hospital Stay: Payer: Medicare PPO | Attending: Hematology and Oncology

## 2022-01-15 ENCOUNTER — Telehealth: Payer: Self-pay

## 2022-01-15 ENCOUNTER — Other Ambulatory Visit: Payer: Self-pay

## 2022-01-15 DIAGNOSIS — C9001 Multiple myeloma in remission: Secondary | ICD-10-CM | POA: Diagnosis not present

## 2022-01-15 DIAGNOSIS — R7989 Other specified abnormal findings of blood chemistry: Secondary | ICD-10-CM | POA: Diagnosis not present

## 2022-01-15 LAB — COMPREHENSIVE METABOLIC PANEL
ALT: 16 U/L (ref 0–44)
AST: 23 U/L (ref 15–41)
Albumin: 4.1 g/dL (ref 3.5–5.0)
Alkaline Phosphatase: 76 U/L (ref 38–126)
Anion gap: 4 — ABNORMAL LOW (ref 5–15)
BUN: 22 mg/dL (ref 8–23)
CO2: 32 mmol/L (ref 22–32)
Calcium: 9.7 mg/dL (ref 8.9–10.3)
Chloride: 105 mmol/L (ref 98–111)
Creatinine, Ser: 1.06 mg/dL — ABNORMAL HIGH (ref 0.44–1.00)
GFR, Estimated: 57 mL/min — ABNORMAL LOW (ref >60)
Glucose, Bld: 62 mg/dL — ABNORMAL LOW (ref 70–99)
Potassium: 3.8 mmol/L (ref 3.5–5.1)
Sodium: 141 mmol/L (ref 135–145)
Total Bilirubin: 0.7 mg/dL (ref 0.3–1.2)
Total Protein: 7.3 g/dL (ref 6.5–8.1)

## 2022-01-15 LAB — CBC WITH DIFFERENTIAL/PLATELET
Abs Immature Granulocytes: 0.01 10*3/uL (ref 0.00–0.07)
Basophils Absolute: 0 10*3/uL (ref 0.0–0.1)
Basophils Relative: 1 %
Eosinophils Absolute: 0.1 10*3/uL (ref 0.0–0.5)
Eosinophils Relative: 1 %
HCT: 39.2 % (ref 36.0–46.0)
Hemoglobin: 13 g/dL (ref 12.0–15.0)
Immature Granulocytes: 0 %
Lymphocytes Relative: 25 %
Lymphs Abs: 1.1 10*3/uL (ref 0.7–4.0)
MCH: 29.9 pg (ref 26.0–34.0)
MCHC: 33.2 g/dL (ref 30.0–36.0)
MCV: 90.1 fL (ref 80.0–100.0)
Monocytes Absolute: 0.3 10*3/uL (ref 0.1–1.0)
Monocytes Relative: 8 %
Neutro Abs: 2.8 10*3/uL (ref 1.7–7.7)
Neutrophils Relative %: 65 %
Platelets: 159 10*3/uL (ref 150–400)
RBC: 4.35 MIL/uL (ref 3.87–5.11)
RDW: 14.3 % (ref 11.5–15.5)
WBC: 4.3 10*3/uL (ref 4.0–10.5)
nRBC: 0 % (ref 0.0–0.2)

## 2022-01-15 NOTE — Telephone Encounter (Signed)
-----   Message from Heath Lark, MD sent at 01/15/2022  9:09 AM EDT ----- I can almost guarantee her MM labs wont be back by 9/7 Can you call and move her appt to 9/12? Either 8 am or 145

## 2022-01-15 NOTE — Telephone Encounter (Signed)
Called and reschedule appt to 9/12 at 1:45 pm.

## 2022-01-19 LAB — KAPPA/LAMBDA LIGHT CHAINS
Kappa free light chain: 18 mg/L (ref 3.3–19.4)
Kappa, lambda light chain ratio: 1.37 (ref 0.26–1.65)
Lambda free light chains: 13.1 mg/L (ref 5.7–26.3)

## 2022-01-20 ENCOUNTER — Encounter (INDEPENDENT_AMBULATORY_CARE_PROVIDER_SITE_OTHER): Payer: Self-pay | Admitting: Bariatrics

## 2022-01-20 ENCOUNTER — Ambulatory Visit (INDEPENDENT_AMBULATORY_CARE_PROVIDER_SITE_OTHER): Payer: Medicare PPO | Admitting: Bariatrics

## 2022-01-20 VITALS — BP 107/70 | HR 60 | Temp 98.1°F | Ht 68.0 in | Wt 266.0 lb

## 2022-01-20 DIAGNOSIS — N1831 Chronic kidney disease, stage 3a: Secondary | ICD-10-CM

## 2022-01-20 DIAGNOSIS — E669 Obesity, unspecified: Secondary | ICD-10-CM | POA: Diagnosis not present

## 2022-01-20 DIAGNOSIS — Z6841 Body Mass Index (BMI) 40.0 and over, adult: Secondary | ICD-10-CM

## 2022-01-20 DIAGNOSIS — R7303 Prediabetes: Secondary | ICD-10-CM | POA: Diagnosis not present

## 2022-01-20 DIAGNOSIS — I10 Essential (primary) hypertension: Secondary | ICD-10-CM | POA: Diagnosis not present

## 2022-01-21 ENCOUNTER — Ambulatory Visit (INDEPENDENT_AMBULATORY_CARE_PROVIDER_SITE_OTHER): Payer: Medicare PPO | Admitting: Bariatrics

## 2022-01-21 ENCOUNTER — Telehealth: Payer: Medicare PPO | Admitting: Hematology and Oncology

## 2022-01-21 LAB — MULTIPLE MYELOMA PANEL, SERUM
Albumin SerPl Elph-Mcnc: 3.2 g/dL (ref 2.9–4.4)
Albumin/Glob SerPl: 1.1 (ref 0.7–1.7)
Alpha 1: 0.2 g/dL (ref 0.0–0.4)
Alpha2 Glob SerPl Elph-Mcnc: 0.9 g/dL (ref 0.4–1.0)
B-Globulin SerPl Elph-Mcnc: 1.5 g/dL — ABNORMAL HIGH (ref 0.7–1.3)
Gamma Glob SerPl Elph-Mcnc: 0.6 g/dL (ref 0.4–1.8)
Globulin, Total: 3.2 g/dL (ref 2.2–3.9)
IgA: 151 mg/dL (ref 87–352)
IgG (Immunoglobin G), Serum: 1032 mg/dL (ref 586–1602)
IgM (Immunoglobulin M), Srm: 31 mg/dL (ref 26–217)
M Protein SerPl Elph-Mcnc: 0.4 g/dL — ABNORMAL HIGH
Total Protein ELP: 6.4 g/dL (ref 6.0–8.5)

## 2022-01-26 ENCOUNTER — Inpatient Hospital Stay: Payer: Medicare PPO | Admitting: Hematology and Oncology

## 2022-01-26 ENCOUNTER — Encounter: Payer: Self-pay | Admitting: Hematology and Oncology

## 2022-01-26 DIAGNOSIS — R7989 Other specified abnormal findings of blood chemistry: Secondary | ICD-10-CM | POA: Diagnosis not present

## 2022-01-26 DIAGNOSIS — C9001 Multiple myeloma in remission: Secondary | ICD-10-CM | POA: Diagnosis not present

## 2022-01-26 NOTE — Assessment & Plan Note (Signed)
I have reviewed results of her recent myeloma panel M protein was detected It could be an early signs of disease although she is not symptomatic I recommend repeat serum protein electrophoresis and free light chain again first week of October If confirmed cancer recurrence, we will discuss further management and follow-up So far, she has no evidence of organ damage

## 2022-01-26 NOTE — Progress Notes (Signed)
HEMATOLOGY-ONCOLOGY ELECTRONIC VISIT PROGRESS NOTE  Patient Care Team: Lucianne Lei, MD as PCP - General (Family Medicine) Jodi Marble, MD as Consulting Physician (Otolaryngology) Heath Lark, MD as Consulting Physician (Hematology and Oncology)  I connected with the patient via telephone conference and verified that I am speaking with the correct person using two identifiers. The patient's location is at home and I am providing care from the [facility] I discussed the limitations, risks, security and privacy concerns of performing an evaluation and management service by e-visits and the availability of in person appointments.  I also discussed with the patient that there may be a patient responsible charge related to this service. The patient expressed understanding and agreed to proceed.   ASSESSMENT & PLAN:  Multiple myeloma in remission ([place]) I have reviewed results of her recent myeloma panel M protein was detected It could be an early signs of disease although she is not symptomatic I recommend repeat serum protein electrophoresis and free light chain again first week of October If confirmed cancer recurrence, we will discuss further management and follow-up So far, she has no evidence of organ damage  Elevated serum creatinine She has intermittent elevated serum creatinine I do not believe this is related to recurrent multiple myeloma  No orders of the defined types were placed in this encounter.   INTERVAL HISTORY: Please see below for problem oriented charting. The purpose of today's discussion is further follow-up and review of recent test results She is currently on close surveillance for history of myeloma She denies recent infection We discussed recent test results She is attempting to lose weight  SUMMARY OF ONCOLOGIC HISTORY: Oncology History  Multiple myeloma in remission ([place])  03/22/2016 Bone Marrow Biopsy   Bone Marrow Biopsy: Plasma cells 17% by  Aspirate and 30% by CD 138 stain. Plasma cell neoplasm, Kappa Restricted Normal cytogenetics, FISH positive for +11 and +14 and +7   04/13/2016 Imaging   Skeletal survey showed lucencies within the calvarium worrisome for myeloma. No definite abnormal lytic or blastic lesions are observed elsewhere.   04/15/2016 Cancer Staging   Staging form: Multiple Myeloma, AJCC 6th Edition - Clinical stage from 04/15/2016: Stage IIA - Signed by Heath Lark, MD on 07/31/2021 Staged by: Managing physician Diagnostic confirmation: Positive histology Specimen type: Biopsy / Limited Resection Histopathologic type: Neoplasm, malignant   04/19/2016 - 09/10/2016 Chemotherapy   The patient consented to treatment with Revlimid, dexamethasone and Velcade   09/14/2016 Bone Marrow Biopsy   In summary, there is a normal cellular marrow with trilineage hematopoiesis.  A mild plasmacytosis is present, but there is no definitive evidence of residual multiple myeloma   10/12/2016 - 10/12/2016 Chemotherapy   She received melphalan as conditioning chemo   10/13/2016 Bone Marrow Transplant   She received autologous stem cell transplant at [facility]   01/24/2017 Bone Marrow Biopsy   BONE MARROW: -          Normocellular marrow for age (50%) with trilineage hematopoiesis. -     No morphologic or immunohistochemical evidence of involvement by plasma cell neoplasm (see comment)  PERIPHERAL BLOOD: -          Unremarkable (see CBC data)  COMMENT: The bone marrow is normocellular for age and shows adequate trilineage hematopoiesis without significant (<10%) dysplastic changes present. Blasts are not increased. Plasma cells represent about 2% of total cells on aspirate smears. Appropriately controlled immunohistochemical stains are performed on the core biopsy. CD138 highlights small interstitial plasma cells  comprising approximately 2% of total cells. These plasma cells appear polytypic as demonstrated by kappa and lambda  in-situ hybridization.   01/25/2017 PET scan   No FDG avid osseous lesions or masses are identified.   02/18/2017 - 04/01/2017 Chemotherapy   She received weekly Velcade, Pomalyst and Dexamethasone   04/15/2017 - 08/28/2021 Chemotherapy   The patient had Pomalyst only   04/20/2017 Bone Marrow Biopsy   Bone Marrow (BM) and Peripheral Blood (PB) FINAL PATHOLOGIC DIAGNOSIS BONE MARROW: Normocellular bone marrow (40%) with normal numbers of megakaryocytes, erythroid hyperplasia and no increase in plasma cells (1%).   10/20/2021 Imaging   Bone density is normal     REVIEW OF SYSTEMS:   Constitutional: Denies fevers, chills or abnormal weight loss Eyes: Denies blurriness of vision Ears, nose, mouth, throat, and face: Denies mucositis or sore throat Respiratory: Denies cough, dyspnea or wheezes Cardiovascular: Denies palpitation, chest discomfort Gastrointestinal:  Denies nausea, heartburn or change in bowel habits Skin: Denies abnormal skin rashes Lymphatics: Denies new lymphadenopathy or easy bruising Neurological:Denies numbness, tingling or new weaknesses Behavioral/Psych: Mood is stable, no new changes  Extremities: No lower extremity edema All other systems were reviewed with the patient and are negative.  I have reviewed the past medical history, past surgical history, social history and family history with the patient and they are unchanged from previous note.  ALLERGIES:  is allergic to revlimid [lenalidomide].  MEDICATIONS:  Current Outpatient Medications  Medication Sig Dispense Refill   acetaminophen (TYLENOL) 500 MG tablet Take 500 mg by mouth every 6 (six) hours as needed.     ASPIRIN 81 PO Take 81 mg by mouth daily.     Calcium Carbonate-Vitamin D (CALCIUM PLUS VITAMIN D PO) Take 1 tablet daily by mouth.     docusate sodium (COLACE) 100 MG capsule Take 200 mg by mouth daily.     fluticasone (FLONASE) 50 MCG/ACT nasal spray Place into both nostrils daily as needed for  allergies or rhinitis.     losartan-hydrochlorothiazide (HYZAAR) 100-25 MG tablet Take 1 tablet by mouth daily. (Patient taking differently: Take 0.5 tablets by mouth daily. TAKES 1/2 TABLET DAILY) 90 tablet 3   montelukast (SINGULAIR) 10 MG tablet      Multiple Vitamin (MULTIVITAMIN) tablet Take 1 tablet by mouth daily.     Polyethylene Glycol 3350 (MIRALAX PO) Take by mouth as needed.     rosuvastatin (CRESTOR) 10 MG tablet Take 10 mg by mouth as directed. One tablet before bedtime.     No current facility-administered medications for this visit.    PHYSICAL EXAMINATION: ECOG PERFORMANCE STATUS: 0 - Asymptomatic  LABORATORY DATA:  I have reviewed the data as listed    Latest Ref Rng & Units 01/15/2022    8:37 AM 01/07/2022   10:59 AM 10/23/2021    9:26 AM  CMP  Glucose 70 - 99 mg/dL 62  89  97   BUN 8 - 23 mg/dL 22  15  23    Creatinine 0.44 - 1.00 mg/dL 1.06  0.91  1.07   Sodium 135 - 145 mmol/L 141  143  138   Potassium 3.5 - 5.1 mmol/L 3.8  4.8  3.9   Chloride 98 - 111 mmol/L 105  103  102   CO2 22 - 32 mmol/L 32  24  29   Calcium 8.9 - 10.3 mg/dL 9.7  9.1  9.8   Total Protein 6.5 - 8.1 g/dL 7.3  6.6  7.2   Total Bilirubin 0.3 -  1.2 mg/dL 0.7  0.9  0.7   Alkaline Phos 38 - 126 U/L 76  86  73   AST 15 - 41 U/L 23  26  19    ALT 0 - 44 U/L 16  17  11      Lab Results  Component Value Date   WBC 4.3 01/15/2022   HGB 13.0 01/15/2022   HCT 39.2 01/15/2022   MCV 90.1 01/15/2022   PLT 159 01/15/2022   NEUTROABS 2.8 01/15/2022     I discussed the assessment and treatment plan with the patient. The patient was provided an opportunity to ask questions and all were answered. The patient agreed with the plan and demonstrated an understanding of the instructions. The patient was advised to call back or seek an in-person evaluation if the symptoms worsen or if the condition fails to improve as anticipated.    I spent 20 minutes for the appointment reviewing test results, discuss  management and coordination of care.  Heath Lark, MD 01/26/2022 1:48 PM

## 2022-01-26 NOTE — Assessment & Plan Note (Signed)
She has intermittent elevated serum creatinine I do not believe this is related to recurrent multiple myeloma

## 2022-01-28 NOTE — Progress Notes (Unsigned)
Chief Complaint:   OBESITY Carolyn Newman is here to discuss her progress with her obesity treatment plan along with follow-up of her obesity related diagnoses. Carolyn Newman is on the Category 2 Plan and states she is following her eating plan approximately 30% of the time. Carolyn Newman states she is walking for 15-20 minutes 3 times per week.  Today's visit was #: 2 Starting weight: 268 lbs Starting date: 01/07/2022 Today's weight: 268 lbs Today's date: 01/20/2022 Total lbs lost to date: 2 Total lbs lost since last in-office visit: 2  Interim History: Carolyn Newman is down 2 pounds since her first visit.  It has been a struggle with her traveling.  She is drinking more water.  Subjective:   1. Prediabetes Carolyn Newman's recent HgbA1c was 5.9 and insulin level 6.7.  2. Essential hypertension Carolyn Newman's blood pressure is controlled.  She is taking Hyzaar currently.  3. Chronic kidney disease, stage 3a (HCC) Carolyn Newman's last GFR was 57.  Assessment/Plan:   1. Prediabetes Carolyn Newman will continue to work on her weight loss and exercise.  Eating out sheet was given. Handouts on insulin resistance and prediabetes were given to the patient today.  2. Essential hypertension Carolyn Newman will continue her medications as directed.  3. Chronic kidney disease, stage 3a (HCC) Carolyn Newman will continue to follow-up with her PCP to monitor.  4. Obesity, Current BMI 40.5 Carolyn Newman is currently in the action stage of change. As such, her goal is to continue with weight loss efforts. She has agreed to the Category 2 Plan.   Meal planning and intentional eating were discussed.  Reviewed labs with the patient from 01/03/2022, CMP, CBC, A1c, insulin, and thyroid panel.  Exercise goals: "Stretch class" and will go to work out rooms.  Behavioral modification strategies: increasing lean protein intake, decreasing simple carbohydrates, increasing vegetables, increasing water intake, decreasing eating out, no skipping meals, meal  planning and cooking strategies, keeping healthy foods in the home, dealing with family or coworker sabotage, travel eating strategies, holiday eating strategies , and celebration eating strategies.  Carolyn Newman has agreed to follow-up with our clinic in 2 to 3 weeks. She was informed of the importance of frequent follow-up visits to maximize her success with intensive lifestyle modifications for her multiple health conditions.   Objective:   Blood pressure 107/70, pulse 60, temperature 98.1 F (36.7 C), height '5\' 8"'$  (1.727 m), weight 266 lb (120.7 kg), SpO2 100 %. Body mass index is 40.45 kg/m.  General: Cooperative, alert, well developed, in no acute distress. HEENT: Conjunctivae and lids unremarkable. Cardiovascular: Regular rhythm.  Lungs: Normal work of breathing. Neurologic: No focal deficits.   Lab Results  Component Value Date   CREATININE 1.06 (H) 01/15/2022   BUN 22 01/15/2022   NA 141 01/15/2022   K 3.8 01/15/2022   CL 105 01/15/2022   CO2 32 01/15/2022   Lab Results  Component Value Date   ALT 16 01/15/2022   AST 23 01/15/2022   ALKPHOS 76 01/15/2022   BILITOT 0.7 01/15/2022   Lab Results  Component Value Date   HGBA1C 5.9 (H) 01/07/2022   HGBA1C 5.6 11/27/2019   Lab Results  Component Value Date   INSULIN 6.7 01/07/2022   INSULIN 9.2 11/27/2019   Lab Results  Component Value Date   TSH 1.480 01/07/2022   Lab Results  Component Value Date   CHOL 159 01/07/2022   HDL 72 01/07/2022   LDLCALC 75 01/07/2022   TRIG 59 01/07/2022   CHOLHDL 2.2 01/07/2022  Lab Results  Component Value Date   VD25OH 75.5 01/07/2022   VD25OH 89.78 07/18/2020   VD25OH 68.6 11/27/2019   Lab Results  Component Value Date   WBC 4.3 01/15/2022   HGB 13.0 01/15/2022   HCT 39.2 01/15/2022   MCV 90.1 01/15/2022   PLT 159 01/15/2022   Lab Results  Component Value Date   IRON 108 11/27/2019   TIBC 250 11/27/2019   FERRITIN 99 11/27/2019   Attestation Statements:    Reviewed by clinician on day of visit: allergies, medications, problem list, medical history, surgical history, family history, social history, and previous encounter notes.    Wilhemena Durie, am acting as Location manager for [employer], DO.  I have reviewed the above documentation for accuracy and completeness, and I agree with the above. Jearld Lesch, DO

## 2022-02-01 ENCOUNTER — Encounter (INDEPENDENT_AMBULATORY_CARE_PROVIDER_SITE_OTHER): Payer: Self-pay | Admitting: Bariatrics

## 2022-02-02 ENCOUNTER — Ambulatory Visit (INDEPENDENT_AMBULATORY_CARE_PROVIDER_SITE_OTHER): Payer: Medicare PPO | Admitting: Adult Health

## 2022-02-02 ENCOUNTER — Encounter (INDEPENDENT_AMBULATORY_CARE_PROVIDER_SITE_OTHER): Payer: Self-pay | Admitting: Adult Health

## 2022-02-02 VITALS — BP 122/82 | HR 62 | Temp 98.4°F | Ht 68.0 in | Wt 266.0 lb

## 2022-02-02 DIAGNOSIS — C9001 Multiple myeloma in remission: Secondary | ICD-10-CM | POA: Diagnosis not present

## 2022-02-02 DIAGNOSIS — E669 Obesity, unspecified: Secondary | ICD-10-CM | POA: Diagnosis not present

## 2022-02-02 DIAGNOSIS — Z6841 Body Mass Index (BMI) 40.0 and over, adult: Secondary | ICD-10-CM | POA: Diagnosis not present

## 2022-02-02 DIAGNOSIS — R7303 Prediabetes: Secondary | ICD-10-CM

## 2022-02-03 ENCOUNTER — Encounter: Payer: Self-pay | Admitting: Hematology and Oncology

## 2022-02-03 NOTE — Progress Notes (Unsigned)
Chief Complaint:   OBESITY Carolyn Newman is here to discuss her progress with her obesity treatment plan along with follow-up of her obesity related diagnoses. Carolyn Newman is on the Category 2 Plan and states she is following her eating plan approximately 80% of the time. Carolyn Newman states she is walking, riding her bike and stretching 20-30 minutes 3 times per week.  Today's visit was #: 3 Starting weight: 268 lbs Starting date: 01/07/2022 Today's weight: 266 lbs Today's date: 02/02/2022 Total lbs lost to date: 2 lbs Total lbs lost since last in-office visit: 0  Interim History: Per Jolayne, she has been plateaued in the 260's in the last year.  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  Of note:  she lost 90% of sense of taste and smell about 3 years, but not related to Covid-19 infection.   Subjective:   1. Multiple myeloma in remission ([place]) 2017 Multiple myeloma diagnosis.  2018 stem cell transplant.  01/26/2022 oncology ***  2. Prediabetes Maternal side, strong history of diabetes.  01/07/2022, A1c 5.9, Insulin level 6.7 ***  Assessment/Plan:   1. Multiple myeloma in remission ([place]) Follow up with oncology as directed.    2. Prediabetes Repeat labs in 3 months.   3. Obesity, Current BMI 40.5 Tanajah is currently in the action stage of change. As such, her goal is to continue with weight loss efforts. She has agreed to the Category 2 Plan.   Exercise goals:  as is.   Behavioral modification strategies: increasing lean protein intake, decreasing simple carbohydrates, meal planning and cooking strategies, keeping healthy foods in the home, and planning for success.  Carolyn Newman has agreed to follow-up with our clinic in 2-3 weeks. She was informed of the importance of frequent follow-up visits to maximize her success with intensive lifestyle modifications for her multiple health conditions.   Objective:   Blood pressure 122/82, pulse  62, temperature 98.4 F (36.9 C), height _0  (1.727 m), weight 266 lb (120.7 kg), SpO2 97 %. Body mass index is 40.45 kg/m.  General: Cooperative, alert, well developed, in no acute distress. HEENT: Conjunctivae and lids unremarkable. Cardiovascular: Regular rhythm.  Lungs: Normal work of breathing. Neurologic: No focal deficits.   Lab Results  Component Value Date   CREATININE 1.06 (H) 01/15/2022   BUN 22 01/15/2022   NA 141 01/15/2022   K 3.8 01/15/2022   CL 105 01/15/2022   CO2 32 01/15/2022   Lab Results  Component Value Date   ALT 16 01/15/2022   AST 23 01/15/2022   ALKPHOS 76 01/15/2022   BILITOT 0.7 01/15/2022   Lab Results  Component Value Date   HGBA1C 5.9 (H) 01/07/2022   HGBA1C 5.6 11/27/2019   Lab Results  Component Value Date   INSULIN 6.7 01/07/2022   INSULIN 9.2 11/27/2019   Lab Results  Component Value Date   TSH 1.480 01/07/2022   Lab Results  Component Value Date   CHOL 159 01/07/2022   HDL 72 01/07/2022   LDLCALC 75 01/07/2022   TRIG 59 01/07/2022   CHOLHDL 2.2 01/07/2022   Lab Results  Component Value Date   VD25OH 75.5 01/07/2022   VD25OH 89.78 07/18/2020   VD25OH 68.6 11/27/2019   Lab Results  Component Value Date   WBC 4.3 01/15/2022   HGB 13.0 01/15/2022   HCT 39.2 01/15/2022   MCV 90.1 01/15/2022   PLT 159 01/15/2022   Lab Results  Component Value Date  IRON 108 11/27/2019   TIBC 250 11/27/2019   FERRITIN 99 11/27/2019    Obesity Behavioral Intervention:   Approximately 15 minutes were spent on the discussion below.  ASK: We discussed the diagnosis of obesity with Neoma Laming today and Esraa agreed to give [place] permission to discuss obesity behavioral modification therapy today.  ASSESS: Sherrelle has the diagnosis of obesity and her BMI today is ***. [place] {ACTION; IS/IS DIX:[id number]} in the action stage of change.   ADVISE: Dallis was educated on the multiple health risks of obesity as well as the benefit of  weight loss to improve her health. She was advised of the need for long term treatment and the importance of lifestyle modifications to improve her current health and to decrease her risk of future health problems.  AGREE: Multiple dietary modification options and treatment options were discussed and Florine agreed to follow the recommendations documented in the above note.  ARRANGE: Zaina was educated on the importance of frequent visits to treat obesity as outlined per CMS and USPSTF guidelines and agreed to schedule her next follow up appointment today.  Attestation Statements:   Reviewed by clinician on day of visit: allergies, medications, problem list, medical history, surgical history, family history, social history, and previous encounter notes.  I, Davy Pique, RMA, am acting as Location manager for Mina Marble, NP.  I have reviewed the above documentation for accuracy and completeness, and I agree with the above. -  ***

## 2022-02-05 ENCOUNTER — Telehealth: Payer: Self-pay | Admitting: Cardiovascular Disease

## 2022-02-05 ENCOUNTER — Telehealth: Payer: Self-pay | Admitting: Hematology and Oncology

## 2022-02-05 ENCOUNTER — Encounter: Payer: Self-pay | Admitting: Cardiovascular Disease

## 2022-02-05 NOTE — Telephone Encounter (Signed)
Returned call to patient who states that she would like to participate in [employer] but states she needs a letter from Dr. Kennon Holter office stating that she is cleared from a cardiac standpoint to participate. Patient would like to know if this is possible. Advised patient I would forward message over to Dr. Gwenlyn Found for him to review and advise. Patient verbalized understanding.   Patient also states that Dr. Gwenlyn Found was not on her careteam on MyChart and she couldn't message in. Advised patient that I have added Dr. Gwenlyn Found to her care team as her cardiologist.

## 2022-02-05 NOTE — Telephone Encounter (Signed)
Rescheduled appointment per room/resource. Patient is aware of the changes made to her upcoming appointment. 

## 2022-02-05 NOTE — Telephone Encounter (Signed)
Patient states she is unable to send Dr. Gwenlyn Found messages on MyChart and she has already spoken with tech support--was advised to contact our office regarding this matter. She is interested in [employer] and would like to have Dr. Gwenlyn Found complete a clearance letter for her.

## 2022-02-18 ENCOUNTER — Inpatient Hospital Stay: Payer: Medicare PPO | Attending: Hematology and Oncology

## 2022-02-18 DIAGNOSIS — C9001 Multiple myeloma in remission: Secondary | ICD-10-CM | POA: Insufficient documentation

## 2022-02-18 DIAGNOSIS — Z79899 Other long term (current) drug therapy: Secondary | ICD-10-CM | POA: Insufficient documentation

## 2022-02-18 DIAGNOSIS — R7989 Other specified abnormal findings of blood chemistry: Secondary | ICD-10-CM | POA: Diagnosis not present

## 2022-02-18 DIAGNOSIS — N1831 Chronic kidney disease, stage 3a: Secondary | ICD-10-CM | POA: Insufficient documentation

## 2022-02-18 LAB — CBC WITH DIFFERENTIAL/PLATELET
Abs Immature Granulocytes: 0.02 10*3/uL (ref 0.00–0.07)
Basophils Absolute: 0 10*3/uL (ref 0.0–0.1)
Basophils Relative: 1 %
Eosinophils Absolute: 0 10*3/uL (ref 0.0–0.5)
Eosinophils Relative: 1 %
HCT: 39 % (ref 36.0–46.0)
Hemoglobin: 12.8 g/dL (ref 12.0–15.0)
Immature Granulocytes: 0 %
Lymphocytes Relative: 23 %
Lymphs Abs: 1.4 10*3/uL (ref 0.7–4.0)
MCH: 29.8 pg (ref 26.0–34.0)
MCHC: 32.8 g/dL (ref 30.0–36.0)
MCV: 90.7 fL (ref 80.0–100.0)
Monocytes Absolute: 0.4 10*3/uL (ref 0.1–1.0)
Monocytes Relative: 6 %
Neutro Abs: 4.2 10*3/uL (ref 1.7–7.7)
Neutrophils Relative %: 69 %
Platelets: 171 10*3/uL (ref 150–400)
RBC: 4.3 MIL/uL (ref 3.87–5.11)
RDW: 14.1 % (ref 11.5–15.5)
WBC: 6.1 10*3/uL (ref 4.0–10.5)
nRBC: 0 % (ref 0.0–0.2)

## 2022-02-18 LAB — COMPREHENSIVE METABOLIC PANEL
ALT: 12 U/L (ref 0–44)
AST: 20 U/L (ref 15–41)
Albumin: 3.9 g/dL (ref 3.5–5.0)
Alkaline Phosphatase: 66 U/L (ref 38–126)
Anion gap: 4 — ABNORMAL LOW (ref 5–15)
BUN: 20 mg/dL (ref 8–23)
CO2: 31 mmol/L (ref 22–32)
Calcium: 9.2 mg/dL (ref 8.9–10.3)
Chloride: 103 mmol/L (ref 98–111)
Creatinine, Ser: 1.02 mg/dL — ABNORMAL HIGH (ref 0.44–1.00)
GFR, Estimated: 59 mL/min — ABNORMAL LOW (ref >60)
Glucose, Bld: 98 mg/dL (ref 70–99)
Potassium: 4.1 mmol/L (ref 3.5–5.1)
Sodium: 138 mmol/L (ref 135–145)
Total Bilirubin: 0.8 mg/dL (ref 0.3–1.2)
Total Protein: 7.1 g/dL (ref 6.5–8.1)

## 2022-02-19 LAB — KAPPA/LAMBDA LIGHT CHAINS
Kappa free light chain: 16.5 mg/L (ref 3.3–19.4)
Kappa, lambda light chain ratio: 1.7 — ABNORMAL HIGH (ref 0.26–1.65)
Lambda free light chains: 9.7 mg/L (ref 5.7–26.3)

## 2022-02-19 NOTE — Telephone Encounter (Signed)
Per Dr. Gwenlyn Found ok to clear pt at low risk from a cardiac standpoint to participate in exercise at [facility]. Letter made and sent to pt via mychart. Pt verbalizes understanding.

## 2022-02-23 ENCOUNTER — Ambulatory Visit (INDEPENDENT_AMBULATORY_CARE_PROVIDER_SITE_OTHER): Payer: Medicare PPO | Admitting: Adult Health

## 2022-02-24 LAB — MULTIPLE MYELOMA PANEL, SERUM
Albumin SerPl Elph-Mcnc: 3.4 g/dL (ref 2.9–4.4)
Albumin/Glob SerPl: 1.2 (ref 0.7–1.7)
Alpha 1: 0.2 g/dL (ref 0.0–0.4)
Alpha2 Glob SerPl Elph-Mcnc: 0.9 g/dL (ref 0.4–1.0)
B-Globulin SerPl Elph-Mcnc: 1 g/dL (ref 0.7–1.3)
Gamma Glob SerPl Elph-Mcnc: 0.8 g/dL (ref 0.4–1.8)
Globulin, Total: 3 g/dL (ref 2.2–3.9)
IgA: 147 mg/dL (ref 87–352)
IgG (Immunoglobin G), Serum: 980 mg/dL (ref 586–1602)
IgM (Immunoglobulin M), Srm: 30 mg/dL (ref 26–217)
Total Protein ELP: 6.4 g/dL (ref 6.0–8.5)

## 2022-02-26 ENCOUNTER — Telehealth: Payer: Medicare PPO | Admitting: Hematology and Oncology

## 2022-02-26 ENCOUNTER — Encounter: Payer: Self-pay | Admitting: Hematology and Oncology

## 2022-02-26 ENCOUNTER — Inpatient Hospital Stay: Payer: Medicare PPO | Admitting: Hematology and Oncology

## 2022-02-26 DIAGNOSIS — N1831 Chronic kidney disease, stage 3a: Secondary | ICD-10-CM

## 2022-02-26 DIAGNOSIS — C9001 Multiple myeloma in remission: Secondary | ICD-10-CM

## 2022-02-26 NOTE — Assessment & Plan Note (Signed)
She has intermittent elevated serum creatinine I recommend hydration prior to future blood work

## 2022-02-26 NOTE — Progress Notes (Signed)
HEMATOLOGY-ONCOLOGY ELECTRONIC VISIT PROGRESS NOTE  Patient Care Team: Lucianne Lei, MD as PCP - General (Family Medicine) Lorretta Harp, MD as PCP - Cardiology (Cardiology) Jodi Marble, MD as Consulting Physician (Otolaryngology) Heath Lark, MD as Consulting Physician (Hematology and Oncology)  I connected with the patient via telephone conference and verified that I am speaking with the correct person using two identifiers. The patient's location is at home and I am providing care from the [facility] I discussed the limitations, risks, security and privacy concerns of performing an evaluation and management service by e-visits and the availability of in person appointments.  I also discussed with the patient that there may be a patient responsible charge related to this service. The patient expressed understanding and agreed to proceed.   ASSESSMENT & PLAN:  Multiple myeloma in remission [facility]) I have reviewed results of her recent myeloma panel Her M spike was detected back in September but recent repeat blood work came back undetectable again I suspect there might be a fluke with her recent blood work I plan to switch her follow-up back to every 3 months and she is in agreement  Chronic kidney disease, stage 3a ([place]) She has intermittent elevated serum creatinine I recommend hydration prior to future blood work  No orders of the defined types were placed in this encounter.   INTERVAL HISTORY: Please see below for problem oriented charting. The purpose of today's discussion is to review test results She is doing well She has no symptoms of myeloma relapse  SUMMARY OF ONCOLOGIC HISTORY: Oncology History  Multiple myeloma in remission ([place])  03/22/2016 Bone Marrow Biopsy   Bone Marrow Biopsy: Plasma cells 17% by Aspirate and 30% by CD 138 stain. Plasma cell neoplasm, Kappa Restricted Normal cytogenetics, FISH positive for +11 and +14 and +7   04/13/2016 Imaging    Skeletal survey showed lucencies within the calvarium worrisome for myeloma. No definite abnormal lytic or blastic lesions are observed elsewhere.   04/15/2016 Cancer Staging   Staging form: Multiple Myeloma, AJCC 6th Edition - Clinical stage from 04/15/2016: Stage IIA - Signed by Heath Lark, MD on 07/31/2021 Staged by: Managing physician Diagnostic confirmation: Positive histology Specimen type: Biopsy / Limited Resection Histopathologic type: Neoplasm, malignant   04/19/2016 - 09/10/2016 Chemotherapy   The patient consented to treatment with Revlimid, dexamethasone and Velcade   09/14/2016 Bone Marrow Biopsy   In summary, there is a normal cellular marrow with trilineage hematopoiesis.  A mild plasmacytosis is present, but there is no definitive evidence of residual multiple myeloma   10/12/2016 - 10/12/2016 Chemotherapy   She received melphalan as conditioning chemo   10/13/2016 Bone Marrow Transplant   She received autologous stem cell [facility] at [facility]   01/24/2017 Bone Marrow Biopsy   BONE MARROW: -          Normocellular marrow for age (50%) with trilineage hematopoiesis. -     No morphologic or immunohistochemical evidence of involvement by plasma cell neoplasm (see comment)  PERIPHERAL BLOOD: -          Unremarkable (see CBC data)  COMMENT: The bone marrow is normocellular for age and shows adequate trilineage hematopoiesis without significant (<10%) dysplastic changes present. Blasts are not increased. Plasma cells represent about 2% of total cells on aspirate smears. Appropriately controlled immunohistochemical stains are performed on the core biopsy. CD138 highlights small interstitial plasma cells comprising approximately 2% of total cells. These plasma cells appear polytypic as demonstrated by kappa and  lambda in-situ hybridization.   01/25/2017 PET scan   No FDG avid osseous lesions or masses are identified.   02/18/2017 - 04/01/2017 Chemotherapy   She received  weekly Velcade, Pomalyst and Dexamethasone   04/15/2017 - 08/28/2021 Chemotherapy   The patient had Pomalyst only   04/20/2017 Bone Marrow Biopsy   Bone Marrow (BM) and Peripheral Blood (PB) FINAL PATHOLOGIC DIAGNOSIS BONE MARROW: Normocellular bone marrow (40%) with normal numbers of megakaryocytes, erythroid hyperplasia and no increase in plasma cells (1%).   10/20/2021 Imaging   Bone density is normal     REVIEW OF SYSTEMS:   Constitutional: Denies fevers, chills or abnormal weight loss Eyes: Denies blurriness of vision Ears, nose, mouth, throat, and face: Denies mucositis or sore throat Respiratory: Denies cough, dyspnea or wheezes Cardiovascular: Denies palpitation, chest discomfort Gastrointestinal:  Denies nausea, heartburn or change in bowel habits Skin: Denies abnormal skin rashes Lymphatics: Denies new lymphadenopathy or easy bruising Neurological:Denies numbness, tingling or new weaknesses Behavioral/Psych: Mood is stable, no new changes  Extremities: No lower extremity edema All other systems were reviewed with the patient and are negative.  I have reviewed the past medical history, past surgical history, social history and family history with the patient and they are unchanged from previous note.  ALLERGIES:  is allergic to revlimid [lenalidomide].  MEDICATIONS:  Current Outpatient Medications  Medication Sig Dispense Refill   acetaminophen (TYLENOL) 500 MG tablet Take 500 mg by mouth every 6 (six) hours as needed.     ASPIRIN 81 PO Take 81 mg by mouth daily.     Calcium Carbonate-Vitamin D (CALCIUM PLUS VITAMIN D PO) Take 1 tablet daily by mouth.     docusate sodium (COLACE) 100 MG capsule Take 200 mg by mouth daily.     fluticasone (FLONASE) 50 MCG/ACT nasal spray Place into both nostrils daily as needed for allergies or rhinitis.     losartan-hydrochlorothiazide (HYZAAR) 100-25 MG tablet Take 1 tablet by mouth daily. (Patient taking differently: Take 0.5 tablets  by mouth daily. TAKES 1/2 TABLET DAILY) 90 tablet 3   montelukast (SINGULAIR) 10 MG tablet      Multiple Vitamin (MULTIVITAMIN) tablet Take 1 tablet by mouth daily.     Polyethylene Glycol 3350 (MIRALAX PO) Take by mouth as needed.     rosuvastatin (CRESTOR) 10 MG tablet Take 10 mg by mouth as directed. One tablet before bedtime.     No current facility-administered medications for this visit.    PHYSICAL EXAMINATION: ECOG PERFORMANCE STATUS: 0 - Asymptomatic  LABORATORY DATA:  I have reviewed the data as listed    Latest Ref Rng & Units 02/18/2022    8:11 AM 01/15/2022    8:37 AM 01/07/2022   10:59 AM  CMP  Glucose 70 - 99 mg/dL 98  62  89   BUN 8 - 23 mg/dL 20  22  15   Creatinine 0.44 - 1.00 mg/dL 1.02  1.06  0.91   Sodium 135 - 145 mmol/L 138  141  143   Potassium 3.5 - 5.1 mmol/L 4.1  3.8  4.8   Chloride 98 - 111 mmol/L 103  105  103   CO2 22 - 32 mmol/L 31  32  24   Calcium 8.9 - 10.3 mg/dL 9.2  9.7  9.1   Total Protein 6.5 - 8.1 g/dL 7.1  7.3  6.6   Total Bilirubin 0.3 - 1.2 mg/dL 0.8  0.7  0.9   Alkaline Phos 38 - 126 U/L   66  76  86   AST 15 - 41 U/L _0 ALT 0 - 44 U/L _1 Lab Results  Component Value Date   WBC 6.1 02/18/2022   HGB 12.8 02/18/2022   HCT 39.0 02/18/2022   MCV 90.7 02/18/2022   PLT 171 02/18/2022   NEUTROABS 4.2 02/18/2022     I discussed the assessment and treatment plan with the patient. The patient was provided an opportunity to ask questions and all were answered. The patient agreed with the plan and demonstrated an understanding of the instructions. The patient was advised to call back or seek an in-person evaluation if the symptoms worsen or if the condition fails to improve as anticipated.    I spent 20 minutes for the appointment reviewing test results, discuss management and coordination of care.  Heath Lark, MD 02/26/2022 11:16 AM

## 2022-02-26 NOTE — Assessment & Plan Note (Signed)
I have reviewed results of her recent myeloma panel Her M spike was detected back in September but recent repeat blood work came back undetectable again I suspect there might be a fluke with her recent blood work I plan to switch her follow-up back to every 3 months and she is in agreement

## 2022-03-01 ENCOUNTER — Ambulatory Visit (INDEPENDENT_AMBULATORY_CARE_PROVIDER_SITE_OTHER): Payer: Medicare PPO | Admitting: Adult Health

## 2022-03-01 ENCOUNTER — Encounter (INDEPENDENT_AMBULATORY_CARE_PROVIDER_SITE_OTHER): Payer: Self-pay | Admitting: Adult Health

## 2022-03-01 VITALS — BP 105/75 | HR 78 | Temp 97.9°F | Ht 68.0 in | Wt 265.0 lb

## 2022-03-01 DIAGNOSIS — R7989 Other specified abnormal findings of blood chemistry: Secondary | ICD-10-CM | POA: Diagnosis not present

## 2022-03-01 DIAGNOSIS — R7303 Prediabetes: Secondary | ICD-10-CM | POA: Diagnosis not present

## 2022-03-01 DIAGNOSIS — N1831 Chronic kidney disease, stage 3a: Secondary | ICD-10-CM

## 2022-03-01 DIAGNOSIS — Z6841 Body Mass Index (BMI) 40.0 and over, adult: Secondary | ICD-10-CM | POA: Diagnosis not present

## 2022-03-01 DIAGNOSIS — E669 Obesity, unspecified: Secondary | ICD-10-CM | POA: Diagnosis not present

## 2022-03-02 DIAGNOSIS — H00022 Hordeolum internum right lower eyelid: Secondary | ICD-10-CM | POA: Diagnosis not present

## 2022-03-02 DIAGNOSIS — H43393 Other vitreous opacities, bilateral: Secondary | ICD-10-CM | POA: Diagnosis not present

## 2022-03-08 NOTE — Progress Notes (Signed)
Chief Complaint:   OBESITY Carolyn Newman is here to discuss her progress with her obesity treatment plan along with follow-up of her obesity related diagnoses. Smt. is on the Category 2 Plan and states she is following her eating plan approximately 70% of the time. Carolyn Newman states she is gym 60+ minutes 2 times per week.  Today's visit was #: 4 Starting weight: 268 lbs Starting date: 01/07/2022 Today's weight: 265 lbs Today's date: 03/01/2022 Total lbs lost to date: 3 lbs Total lbs lost since last in-office visit: 1 lb  Interim History:  Saturday-Monday, different city everyday, school visits.(Day trips).   She is "Teacher-Trainer-Coach".   Her busy season, August through October.    She recently joined [employer].  She will travel to [facility] on November 5-10, [place] trip.    Subjective:   1. Prediabetes Lab Results  Component Value Date   HGBA1C 5.9 (H) 01/07/2022   HGBA1C 5.6 11/27/2019    She has never been on any blood glucose lowering medications.  Her mother had T2DM.   2. Elevated serum creatinine Epic review reveal creatinine levels fluctuate 0.91-1.24.   She denies known family history of renal disease.   Blood pressure is excellent at office visit.  She is on losartan/HCTZ 100/25 mg daily.   Assessment/Plan:   1. Prediabetes Decrease sugar and carbohydrate intake, increase protein intake.   2. Elevated serum creatinine Avoid NSAID's.   3. Obesity, Current BMI 40.4 Handout:  Holiday recipe guide.   [name] is currently in the action stage of change. As such, her goal is to continue with weight loss efforts. She has agreed to keeping a food journal and adhering to recommended goals of 1200 calories and 85 protein daily.   Exercise goals:  as is.   Behavioral modification strategies: increasing lean protein intake, decreasing simple carbohydrates, meal planning and cooking strategies, keeping healthy foods in the home, and planning for  success.  Carolyn Newman has agreed to follow-up with our clinic in 3 weeks. She was informed of the importance of frequent follow-up visits to maximize her success with intensive lifestyle modifications for her multiple health conditions.   Objective:   Blood pressure 105/75, pulse 78, temperature 97.9 F (36.6 C), height '5\' 8"'$  (1.727 m), weight 265 lb (120.2 kg), SpO2 100 %. Body mass index is 40.29 kg/m.  General: Cooperative, alert, well developed, in no acute distress. HEENT: Conjunctivae and lids unremarkable. Cardiovascular: Regular rhythm.  Lungs: Normal work of breathing. Neurologic: No focal deficits.   Lab Results  Component Value Date   CREATININE 1.02 (H) 02/18/2022   BUN 20 02/18/2022   NA 138 02/18/2022   K 4.1 02/18/2022   CL 103 02/18/2022   CO2 31 02/18/2022   Lab Results  Component Value Date   ALT 12 02/18/2022   AST 20 02/18/2022   ALKPHOS 66 02/18/2022   BILITOT 0.8 02/18/2022   Lab Results  Component Value Date   HGBA1C 5.9 (H) 01/07/2022   HGBA1C 5.6 11/27/2019   Lab Results  Component Value Date   INSULIN 6.7 01/07/2022   INSULIN 9.2 11/27/2019   Lab Results  Component Value Date   TSH 1.480 01/07/2022   Lab Results  Component Value Date   CHOL 159 01/07/2022   HDL 72 01/07/2022   LDLCALC 75 01/07/2022   TRIG 59 01/07/2022   CHOLHDL 2.2 01/07/2022   Lab Results  Component Value Date   VD25OH 75.5 01/07/2022   VD25OH 89.78 07/18/2020  VD25OH 68.6 11/27/2019   Lab Results  Component Value Date   WBC 6.1 02/18/2022   HGB 12.8 02/18/2022   HCT 39.0 02/18/2022   MCV 90.7 02/18/2022   PLT 171 02/18/2022   Lab Results  Component Value Date   IRON 108 11/27/2019   TIBC 250 11/27/2019   FERRITIN 99 11/27/2019   Attestation Statements:   Reviewed by clinician on day of visit: allergies, medications, problem list, medical history, surgical history, family history, social history, and previous encounter notes.  I, Davy Pique,  RMA, am acting as Location manager for Mina Marble, NP.  I have reviewed the above documentation for accuracy and completeness, and I agree with the above. -  Leisl Spurrier d. Tymarion Everard, NP-C

## 2022-03-30 ENCOUNTER — Ambulatory Visit
Admission: EM | Admit: 2022-03-30 | Discharge: 2022-03-30 | Disposition: A | Discharge status: Home / Self Care | Payer: Medicare PPO | Attending: Internal Medicine | Admitting: Internal Medicine

## 2022-03-30 ENCOUNTER — Ambulatory Visit (INDEPENDENT_AMBULATORY_CARE_PROVIDER_SITE_OTHER): Payer: Medicare PPO | Admitting: Family Medicine

## 2022-03-30 ENCOUNTER — Ambulatory Visit (INDEPENDENT_AMBULATORY_CARE_PROVIDER_SITE_OTHER): Payer: Medicare PPO

## 2022-03-30 DIAGNOSIS — Z79899 Other long term (current) drug therapy: Secondary | ICD-10-CM | POA: Insufficient documentation

## 2022-03-30 DIAGNOSIS — J069 Acute upper respiratory infection, unspecified: Secondary | ICD-10-CM | POA: Diagnosis not present

## 2022-03-30 DIAGNOSIS — Z1152 Encounter for screening for COVID-19: Secondary | ICD-10-CM | POA: Insufficient documentation

## 2022-03-30 DIAGNOSIS — B370 Candidal stomatitis: Secondary | ICD-10-CM | POA: Insufficient documentation

## 2022-03-30 DIAGNOSIS — R059 Cough, unspecified: Secondary | ICD-10-CM

## 2022-03-30 DIAGNOSIS — B379 Candidiasis, unspecified: Secondary | ICD-10-CM | POA: Insufficient documentation

## 2022-03-30 DIAGNOSIS — R079 Chest pain, unspecified: Secondary | ICD-10-CM | POA: Diagnosis not present

## 2022-03-30 LAB — RESP PANEL BY RT-PCR (RSV, FLU A&B, COVID)  RVPGX2
Influenza A by PCR: NEGATIVE
Influenza B by PCR: NEGATIVE
Resp Syncytial Virus by PCR: NEGATIVE
SARS Coronavirus 2 by RT PCR: NEGATIVE

## 2022-03-30 MED ORDER — NYSTATIN 100000 UNIT/ML MT SUSP: 500000.0000 [IU] | Freq: Four times a day (QID) | OROMUCOSAL | 0 refills | Status: DC

## 2022-03-30 MED ORDER — ALBUTEROL SULFATE HFA 108 (90 BASE) MCG/ACT IN AERS: 1.0000 | INHALATION_SPRAY | Freq: Four times a day (QID) | RESPIRATORY_TRACT | 0 refills | Status: DC | PRN

## 2022-03-30 MED ORDER — BENZONATATE 100 MG PO CAPS: 100.0000 mg | ORAL_CAPSULE | Freq: Three times a day (TID) | ORAL | 0 refills | Status: DC | PRN

## 2022-03-30 NOTE — Discharge Instructions (Signed)
Your chest x-ray and EKG were normal.  I have prescribed you several different medications including a mouthwash to help alleviate possible thrush.  Please follow-up if any symptoms persist or worsen.  COVID-19, flu, RSV test is pending.  We will call if it is positive.

## 2022-03-30 NOTE — ED Triage Notes (Signed)
[facility] to [facility] with [facility] of cough, cheast heaviness and tongue coated. Pt reports symptoms since Saturday, pt has pmh of multiple myeloma. Has been taking otc htn approved cold medications.

## 2022-03-30 NOTE — ED Provider Notes (Signed)
[name]    CSN: [id number] Arrival date & time: 03/30/22  1349      History   Chief Complaint Chief Complaint  Patient presents with   Cough    HPI Carolyn Newman is a 71 y.o. female.   Patient presents with several different chief complaints today.  Patient reports that she has had nasal congestion, cough, feeling of "chest heaviness" that started about 4 days ago.  Patient denies any known sick contacts or fevers at home.  Patient denies chest pain or shortness of breath but states that just feels "heavy" on her chest.  Denies sore throat, ear pain, nausea, vomiting, diarrhea, abdominal pain.  Patient has taken over-the-counter Coricidin with minimal improvement in symptoms.  Patient denies formal diagnosis of asthma or COPD.  Patient also reporting a white coating on her tongue that started about 2 days ago.  She denies pain or itchiness associated.  Denies any recent antibiotic or steroid therapy.  States that she went to her dentist a few days ago but they did not find any abnormalities.  Denies history of thrush.   Cough   Past Medical History:  Diagnosis Date   Alopecia    Anemia    Anosmia    Back pain    Bilateral knee pain    Cancer (HCC)    myeloma   Chewing difficulty    Constipation    Elevated serum creatinine    Hyperlipidemia    Hypertension    Joint pain    Leukopenia    Multiple myeloma (HCC)    Neuropathy    Right and left feet    Obesity    SOB (shortness of breath)    Thoracic aortic aneurysm without rupture [facility])     Patient Active Problem List   Diagnosis Date Noted   Chronic kidney disease, stage 3a ([place]) 01/20/2022   SOB (shortness of breath) on exertion 01/07/2022   Vitamin D deficiency 01/07/2022   Essential hypertension 01/07/2022   Class 3 severe obesity with serious comorbidity and body mass index (BMI) of 40.0 to 44.9 in adult [facility]) 01/07/2022   Other fatigue 07/25/2020   Prediabetes 04/13/2020   Visceral  obesity 67/54/4920   Metabolic syndrome 02/20/1218   Preventive measure 01/25/2020   Leukopenia due to antineoplastic chemotherapy ([place]) 01/11/2019   Other constipation 01/11/2019   Hyperlipidemia 06/23/2018   Thoracic aortic aneurysm without rupture ([place]) 05/19/2018   Anosmia 11/15/2017   Class 2 obesity due to excess calories with body mass index (BMI) of 39.0 to 39.9 in adult 08/18/2017   Hypotension due to drugs 04/15/2017   Sinus congestion 03/25/2017   S/P autologous bone marrow transplantation ([place]) 02/01/2017   Peripheral neuropathy due to chemotherapy ([place]) 07/13/2016   Physical debility 07/13/2016   Allergic rhinitis 07/02/2016   Rash due to allergy 07/02/2016   Elevated serum creatinine 05/22/2016   Financial difficulty 05/22/2016   Pancytopenia, acquired ([place]) 04/19/2016   Multiple myeloma in remission ([place]) 04/04/2016   Normocytic anemia 03/09/2016    Past Surgical History:  Procedure Laterality Date   CATARACT EXTRACTION     EYE SURGERY     INJECTION KNEE     LIMBAL STEM CELL TRANSPLANT      OB History     Gravida  0   Para  0   Term  0   Preterm  0   AB  0   Living  0      SAB  0  IAB  0   Ectopic  0   Multiple  0   Live Births  0            Home Medications    Prior to Admission medications   Medication Sig Start Date End Date Taking? Authorizing Provider  albuterol (VENTOLIN HFA) 108 (90 Base) MCG/ACT inhaler Inhale 1-2 puffs into the lungs every 6 (six) hours as needed for wheezing or shortness of breath. 03/30/22  Yes Tamsen Reist, Hildred Alamin E, FNP  benzonatate (TESSALON) 100 MG capsule Take 1 capsule (100 mg total) by mouth every 8 (eight) hours as needed for cough. 03/30/22  Yes Taylia Berber, Hildred Alamin E, FNP  nystatin (MYCOSTATIN) 100000 UNIT/ML suspension Take 5 mLs (500,000 Units total) by mouth 4 (four) times daily. 03/30/22  Yes Jaxiel Kines, Hildred Alamin E, FNP  acetaminophen (TYLENOL) 500 MG tablet Take 500 mg by mouth every 6 (six) hours as needed.     [provider]  ASPIRIN 81 PO Take 81 mg by mouth daily.    [provider]  Calcium Carbonate-Vitamin D (CALCIUM PLUS VITAMIN D PO) Take 1 tablet daily by mouth. 02/16/17   [provider]  docusate sodium (COLACE) 100 MG capsule Take 200 mg by mouth daily.    [provider]  fluticasone (FLONASE) 50 MCG/ACT nasal spray Place into both nostrils daily as needed for allergies or rhinitis.    [provider]  losartan-hydrochlorothiazide (HYZAAR) 100-25 MG tablet Take 1 tablet by mouth daily. Patient taking differently: Take 0.5 tablets by mouth daily. TAKES 1/2 TABLET DAILY 04/26/16   Heath Lark, MD  montelukast (SINGULAIR) 10 MG tablet  10/15/21   [provider]  Multiple Vitamin (MULTIVITAMIN) tablet Take 1 tablet by mouth daily.    [provider]  Polyethylene Glycol 3350 (MIRALAX PO) Take by mouth as needed.    [provider]  rosuvastatin (CRESTOR) 10 MG tablet Take 10 mg by mouth as directed. One tablet before bedtime.    [provider]    Family History Family History  Problem Relation Age of Onset   Cancer Mother        lymphoma   Diabetes Mother    Hypertension Mother    Hyperlipidemia Mother    Depression Mother    Obesity Mother    Heart disease Mother    Cancer Father        prostate ca   Heart disease Father    Colon cancer Maternal Grandmother    Diabetes Maternal Grandfather     Social History Social History   Tobacco Use   Smoking status: Never   Smokeless tobacco: Never  Vaping Use   Vaping Use: Never used  Substance Use Topics   Alcohol use: No   Drug use: No     Allergies   Revlimid [lenalidomide]   Review of Systems Review of Systems Per HPI  Physical Exam Triage Vital Signs ED Triage Vitals  Enc Vitals Group     BP 03/30/22 1432 (!) 144/85     Pulse Rate 03/30/22 1432 (!) 53     Resp 03/30/22 1432 16     Temp 03/30/22 1432 97.8 F (36.6 C)     Temp src  --      SpO2 03/30/22 1432 98 %     Weight --      Height --      Head Circumference --      Peak Flow --      Pain Score 03/30/22  1431 0     Pain Loc --      Pain Edu? --      Excl. in [place]? --    No data found.  Updated Vital Signs BP (!) 144/85   Pulse (!) 53   Temp 97.8 F (36.6 C)   Resp 16   SpO2 98%   Visual Acuity Right Eye Distance:   Left Eye Distance:   Bilateral Distance:    Right Eye Near:   Left Eye Near:    Bilateral Near:     Physical Exam Constitutional:      General: She is not in acute distress.    Appearance: Normal appearance. She is not toxic-appearing or diaphoretic.  HENT:     Head: Normocephalic and atraumatic.     Right Ear: Tympanic membrane and ear canal normal.     Left Ear: Tympanic membrane and ear canal normal.     Nose: Congestion present.     Mouth/Throat:     Mouth: Mucous membranes are moist.     Pharynx: No posterior oropharyngeal erythema.     Comments: White coating throughout tongue. Eyes:     Extraocular Movements: Extraocular movements intact.     Conjunctiva/sclera: Conjunctivae normal.     Pupils: Pupils are equal, round, and reactive to light.  Cardiovascular:     Rate and Rhythm: Normal rate and regular rhythm.     Pulses: Normal pulses.     Heart sounds: Normal heart sounds.  Pulmonary:     Effort: Pulmonary effort is normal. No respiratory distress.     Breath sounds: Normal breath sounds. No stridor. No wheezing, rhonchi or rales.  Abdominal:     General: Abdomen is flat. Bowel sounds are normal.     Palpations: Abdomen is soft.  Musculoskeletal:        General: Normal range of motion.     Cervical back: Normal range of motion.  Skin:    General: Skin is warm and dry.  Neurological:     General: No focal deficit present.     Mental Status: She is alert and oriented to person, place, and time. Mental status is at baseline.  Psychiatric:        Mood and Affect: Mood normal.        Behavior: Behavior  normal.      UC Treatments / Results  Labs (all labs ordered are listed, but only abnormal results are displayed) Labs Reviewed  RESP PANEL BY RT-PCR (RSV, FLU A&B, COVID)  [id number]    EKG   Radiology DG Chest 2 View  Result Date: 03/30/2022 CLINICAL DATA:  Cough, chest heaviness EXAM: CHEST - 2 VIEW COMPARISON:  06/05/2021, 10/05/2021 FINDINGS: Frontal and lateral views of the chest demonstrate an unremarkable cardiac silhouette. Stable enlargement and ectasia of the thoracic aorta compatible with known ascending thoracic aortic aneurysm. No acute airspace disease, effusion, or pneumothorax. Stable multilevel thoracic spondylosis and hypertrophic changes at the bilateral costovertebral junctions. IMPRESSION: 1. No acute airspace disease. 2. Stable enlargement and ectasia of the thoracic aorta consistent with known thoracic aortic aneurysm. Electronically Signed   By: Randa Ngo M.D.   On: 03/30/2022 15:15    Procedures Procedures (including critical care time)  Medications Ordered in UC Medications - No data to display  Initial Impression / Assessment and Plan / UC Course  I have reviewed the triage vital signs and the nursing notes.  Pertinent labs & imaging results that were available during my care  of the patient were reviewed by me and considered in my medical decision making (see chart for details).     Patient presents with symptoms likely from a viral upper respiratory infection. Differential includes bacterial pneumonia, sinusitis, allergic rhinitis, Covid 19, flu, RSV.  Patient is nontoxic appearing and not in need of emergent medical intervention. Viral testing pending. Patient reporting "chest heaviness" which is most likely due to inflammation from viral illness. EKG completed that was unremarkable for any acute etiology and unremarkable when compared to previous EKG.  Chest x-ray negative for any acute cardiopulmonary process. It did show stable aortic aneurysm.    Recommended symptom control with over the counter medications that are safe for patient. Patient sent prescriptions including an inhaler as well. Nystatin for suspicion of oral thrush.  Return if symptoms fail to improve in 1-2 weeks or you develop shortness of breath, chest pain, severe headache. Patient states understanding and is agreeable.  Discharged with PCP followup.  Final Clinical Impressions(s) / UC Diagnoses   Final diagnoses:  Thrush  Viral upper respiratory tract infection with cough     Discharge Instructions      Your chest x-ray and EKG were normal.  I have prescribed you several different medications including a mouthwash to help alleviate possible thrush.  Please follow-up if any symptoms persist or worsen.  COVID-19, flu, RSV test is pending.  We will call if it is positive.     ED Prescriptions     Medication Sig Dispense Auth. Provider   nystatin (MYCOSTATIN) 100000 UNIT/ML suspension Take 5 mLs (500,000 Units total) by mouth 4 (four) times daily. 60 mL Aubrey Voong, Hildred Alamin E, [place]   albuterol (VENTOLIN HFA) 108 (90 Base) MCG/ACT inhaler Inhale 1-2 puffs into the lungs every 6 (six) hours as needed for wheezing or shortness of breath. 1 each [place], [place] E, [employer]   benzonatate (TESSALON) 100 MG capsule Take 1 capsule (100 mg total) by mouth every 8 (eight) hours as needed for cough. 21 capsule Patterson, Michele Rockers, [place]      PDMP not reviewed this encounter.   Teodora Medici,  03/30/22 1537

## 2022-04-05 DIAGNOSIS — J069 Acute upper respiratory infection, unspecified: Secondary | ICD-10-CM | POA: Diagnosis not present

## 2022-04-05 DIAGNOSIS — I10 Essential (primary) hypertension: Secondary | ICD-10-CM | POA: Diagnosis not present

## 2022-04-05 DIAGNOSIS — J209 Acute bronchitis, unspecified: Secondary | ICD-10-CM | POA: Diagnosis not present

## 2022-04-05 DIAGNOSIS — E789 Disorder of lipoprotein metabolism, unspecified: Secondary | ICD-10-CM | POA: Diagnosis not present

## 2022-04-05 DIAGNOSIS — E6609 Other obesity due to excess calories: Secondary | ICD-10-CM | POA: Diagnosis not present

## 2022-04-05 DIAGNOSIS — E785 Hyperlipidemia, unspecified: Secondary | ICD-10-CM | POA: Diagnosis not present

## 2022-04-05 DIAGNOSIS — C9001 Multiple myeloma in remission: Secondary | ICD-10-CM | POA: Diagnosis not present

## 2022-04-05 DIAGNOSIS — E8881 Metabolic syndrome: Secondary | ICD-10-CM | POA: Diagnosis not present

## 2022-04-05 DIAGNOSIS — E1169 Type 2 diabetes mellitus with other specified complication: Secondary | ICD-10-CM | POA: Diagnosis not present

## 2022-04-05 DIAGNOSIS — E089 Diabetes mellitus due to underlying condition without complications: Secondary | ICD-10-CM | POA: Diagnosis not present

## 2022-04-06 ENCOUNTER — Ambulatory Visit (INDEPENDENT_AMBULATORY_CARE_PROVIDER_SITE_OTHER): Payer: Medicare PPO | Admitting: Family Medicine

## 2022-04-06 ENCOUNTER — Encounter (INDEPENDENT_AMBULATORY_CARE_PROVIDER_SITE_OTHER): Payer: Self-pay | Admitting: Family Medicine

## 2022-04-06 VITALS — BP 114/77 | HR 67 | Temp 97.8°F | Ht 68.0 in | Wt 263.0 lb

## 2022-04-06 DIAGNOSIS — Z6841 Body Mass Index (BMI) 40.0 and over, adult: Secondary | ICD-10-CM | POA: Diagnosis not present

## 2022-04-06 DIAGNOSIS — E669 Obesity, unspecified: Secondary | ICD-10-CM | POA: Diagnosis not present

## 2022-04-06 DIAGNOSIS — I10 Essential (primary) hypertension: Secondary | ICD-10-CM | POA: Diagnosis not present

## 2022-04-06 DIAGNOSIS — E7849 Other hyperlipidemia: Secondary | ICD-10-CM

## 2022-04-20 NOTE — Progress Notes (Signed)
Chief Complaint:   OBESITY Carolyn Newman is here to discuss her progress with her obesity treatment plan along with follow-up of her obesity related diagnoses. Carolyn Newman is on keeping a food journal and adhering to recommended goals of 1200 calories and 85 grams of protein and states she is following her eating plan approximately 70% of the time. Carolyn Newman states she is going to the gym and stretching 30-45 minutes 1-2 times per week.  Today's visit was #: 5 Starting weight: 268 lbs Starting date: 01/07/2022 Today's weight: 263 lbs Today's date: 04/06/2022 Total lbs lost to date: 5 lbs Total lbs lost since last in-office visit: 2  Interim History: Carolyn Newman went to [facility] since her last visit.  She was very conscious of eating habits and has had a upper respiratory infection since then.  She has been going to [facility] for 2 sessions a week.  Has not had much of an appetite.  She is going to family gatherings for Thanksgiving.  Subjective:   1. Essential hypertension Carolyn Newman's blood pressure control today. Denies chest pain, chest pressure and headache. Carolyn Newman is on Hyzaar.  2. Other hyperlipidemia Carolyn Newman is currently taking Crestor 10 mg daily.  Her last LDL was within normal limits.  Assessment/Plan:   1. Essential hypertension Will follow-up on blood pressure at next appointment without any changes in dose of medication.  2. Other hyperlipidemia Continue taking Crestor daily.  3. Obesity, Current BMI is 40.0 Carolyn Newman is currently in the action stage of change. As such, her goal is to continue with weight loss efforts. She has agreed to the Category 2 Plan and keeping a food journal and adhering to recommended goals of 1200 calories and 85 protein daily.   Exercise goals: All adults should avoid inactivity. Some physical activity is better than none, and adults who participate in any amount of physical activity gain some health benefits.  Behavioral modification strategies:  increasing lean protein intake, meal planning and cooking strategies, keeping healthy foods in the home, and planning for success.  Carolyn Newman has agreed to follow-up with our clinic in 4 weeks. She was informed of the importance of frequent follow-up visits to maximize her success with intensive lifestyle modifications for her multiple health conditions.   Objective:   Blood pressure 114/77, pulse 67, temperature 97.8 F (36.6 C), height '5\' 8"'$  (1.727 m), weight 263 lb (119.3 kg), SpO2 98 %. Body mass index is 39.99 kg/m.  General: Cooperative, alert, well developed, in no acute distress. HEENT: Conjunctivae and lids unremarkable. Cardiovascular: Regular rhythm.  Lungs: Normal work of breathing. Neurologic: No focal deficits.   Lab Results  Component Value Date   CREATININE 1.02 (H) 02/18/2022   BUN 20 02/18/2022   NA 138 02/18/2022   K 4.1 02/18/2022   CL 103 02/18/2022   CO2 31 02/18/2022   Lab Results  Component Value Date   ALT 12 02/18/2022   AST 20 02/18/2022   ALKPHOS 66 02/18/2022   BILITOT 0.8 02/18/2022   Lab Results  Component Value Date   HGBA1C 5.9 (H) 01/07/2022   HGBA1C 5.6 11/27/2019   Lab Results  Component Value Date   INSULIN 6.7 01/07/2022   INSULIN 9.2 11/27/2019   Lab Results  Component Value Date   TSH 1.480 01/07/2022   Lab Results  Component Value Date   CHOL 159 01/07/2022   HDL 72 01/07/2022   LDLCALC 75 01/07/2022   TRIG 59 01/07/2022   CHOLHDL 2.2 01/07/2022   Lab  Results  Component Value Date   VD25OH 75.5 01/07/2022   VD25OH 89.78 07/18/2020   VD25OH 68.6 11/27/2019   Lab Results  Component Value Date   WBC 6.1 02/18/2022   HGB 12.8 02/18/2022   HCT 39.0 02/18/2022   MCV 90.7 02/18/2022   PLT 171 02/18/2022   Lab Results  Component Value Date   IRON 108 11/27/2019   TIBC 250 11/27/2019   FERRITIN 99 11/27/2019   Attestation Statements:   Reviewed by clinician on day of visit: allergies, medications, problem list,  medical history, surgical history, family history, social history, and previous encounter notes.  I, Elnora Morrison, RMA am acting as transcriptionist for Coralie Common, MD.  I have reviewed the above documentation for accuracy and completeness, and I agree with the above. - Coralie Common, MD

## 2022-04-26 DIAGNOSIS — R7309 Other abnormal glucose: Secondary | ICD-10-CM | POA: Diagnosis not present

## 2022-04-26 DIAGNOSIS — I10 Essential (primary) hypertension: Secondary | ICD-10-CM | POA: Diagnosis not present

## 2022-04-26 DIAGNOSIS — R0981 Nasal congestion: Secondary | ICD-10-CM | POA: Diagnosis not present

## 2022-05-03 ENCOUNTER — Encounter (INDEPENDENT_AMBULATORY_CARE_PROVIDER_SITE_OTHER): Payer: Self-pay | Admitting: Family Medicine

## 2022-05-03 ENCOUNTER — Ambulatory Visit (INDEPENDENT_AMBULATORY_CARE_PROVIDER_SITE_OTHER): Payer: Medicare PPO | Admitting: Family Medicine

## 2022-05-03 NOTE — Telephone Encounter (Signed)
Please call pt to reschedule.

## 2022-05-06 DIAGNOSIS — Z1231 Encounter for screening mammogram for malignant neoplasm of breast: Secondary | ICD-10-CM | POA: Diagnosis not present

## 2022-05-07 NOTE — Telephone Encounter (Signed)
I have left 2 vm in regard.  I will also send a mychart message.

## 2022-05-25 ENCOUNTER — Encounter: Payer: Self-pay | Admitting: Hematology and Oncology

## 2022-05-25 ENCOUNTER — Encounter (INDEPENDENT_AMBULATORY_CARE_PROVIDER_SITE_OTHER): Payer: Self-pay | Admitting: Adult Health

## 2022-05-25 ENCOUNTER — Ambulatory Visit (INDEPENDENT_AMBULATORY_CARE_PROVIDER_SITE_OTHER): Payer: Medicare PPO | Admitting: Adult Health

## 2022-05-25 VITALS — BP 121/80 | HR 65 | Temp 97.6°F | Ht 68.0 in | Wt 268.0 lb

## 2022-05-25 DIAGNOSIS — J069 Acute upper respiratory infection, unspecified: Secondary | ICD-10-CM | POA: Diagnosis not present

## 2022-05-25 DIAGNOSIS — E669 Obesity, unspecified: Secondary | ICD-10-CM | POA: Diagnosis not present

## 2022-05-25 DIAGNOSIS — Z6841 Body Mass Index (BMI) 40.0 and over, adult: Secondary | ICD-10-CM | POA: Diagnosis not present

## 2022-05-25 DIAGNOSIS — E66813 Obesity, class 3: Secondary | ICD-10-CM

## 2022-05-26 DIAGNOSIS — M25561 Pain in right knee: Secondary | ICD-10-CM | POA: Diagnosis not present

## 2022-05-26 DIAGNOSIS — M25661 Stiffness of right knee, not elsewhere classified: Secondary | ICD-10-CM | POA: Diagnosis not present

## 2022-05-26 DIAGNOSIS — R262 Difficulty in walking, not elsewhere classified: Secondary | ICD-10-CM | POA: Diagnosis not present

## 2022-05-26 DIAGNOSIS — M1711 Unilateral primary osteoarthritis, right knee: Secondary | ICD-10-CM | POA: Diagnosis not present

## 2022-05-27 ENCOUNTER — Ambulatory Visit (INDEPENDENT_AMBULATORY_CARE_PROVIDER_SITE_OTHER): Payer: Medicare PPO | Admitting: Adult Health

## 2022-05-31 ENCOUNTER — Other Ambulatory Visit: Payer: Self-pay

## 2022-05-31 ENCOUNTER — Inpatient Hospital Stay: Payer: Medicare PPO | Attending: Hematology and Oncology

## 2022-05-31 DIAGNOSIS — C9001 Multiple myeloma in remission: Secondary | ICD-10-CM | POA: Diagnosis not present

## 2022-05-31 LAB — CBC WITH DIFFERENTIAL/PLATELET
Abs Immature Granulocytes: 0.02 10*3/uL (ref 0.00–0.07)
Basophils Absolute: 0 10*3/uL (ref 0.0–0.1)
Basophils Relative: 1 %
Eosinophils Absolute: 0.1 10*3/uL (ref 0.0–0.5)
Eosinophils Relative: 1 %
HCT: 41.6 % (ref 36.0–46.0)
Hemoglobin: 13.7 g/dL (ref 12.0–15.0)
Immature Granulocytes: 0 %
Lymphocytes Relative: 24 %
Lymphs Abs: 1.3 10*3/uL (ref 0.7–4.0)
MCH: 29.7 pg (ref 26.0–34.0)
MCHC: 32.9 g/dL (ref 30.0–36.0)
MCV: 90 fL (ref 80.0–100.0)
Monocytes Absolute: 0.4 10*3/uL (ref 0.1–1.0)
Monocytes Relative: 7 %
Neutro Abs: 3.7 10*3/uL (ref 1.7–7.7)
Neutrophils Relative %: 67 %
Platelets: 183 10*3/uL (ref 150–400)
RBC: 4.62 MIL/uL (ref 3.87–5.11)
RDW: 14.9 % (ref 11.5–15.5)
WBC: 5.4 10*3/uL (ref 4.0–10.5)
nRBC: 0 % (ref 0.0–0.2)

## 2022-05-31 LAB — COMPREHENSIVE METABOLIC PANEL
ALT: 15 U/L (ref 0–44)
AST: 21 U/L (ref 15–41)
Albumin: 3.9 g/dL (ref 3.5–5.0)
Alkaline Phosphatase: 88 U/L (ref 38–126)
Anion gap: 4 — ABNORMAL LOW (ref 5–15)
BUN: 20 mg/dL (ref 8–23)
CO2: 32 mmol/L (ref 22–32)
Calcium: 9.9 mg/dL (ref 8.9–10.3)
Chloride: 100 mmol/L (ref 98–111)
Creatinine, Ser: 0.95 mg/dL (ref 0.44–1.00)
GFR, Estimated: >60 mL/min (ref >60)
Glucose, Bld: 73 mg/dL (ref 70–99)
Potassium: 3.9 mmol/L (ref 3.5–5.1)
Sodium: 136 mmol/L (ref 135–145)
Total Bilirubin: 0.7 mg/dL (ref 0.3–1.2)
Total Protein: 7.1 g/dL (ref 6.5–8.1)

## 2022-05-31 NOTE — Progress Notes (Signed)
Chief Complaint:   OBESITY Carolyn Newman is here to discuss her progress with her obesity treatment plan along with follow-up of her obesity related diagnoses. Carolyn Newman is on the Category 2 Plan and states she is following her eating plan approximately 75% of the time. Carolyn Newman states she is not exercising.  Today's visit was #: 6 Starting weight: 82 LBS Starting date: 01/07/2022 Today's weight: 268 LBS Today's date: 05/25/2022 Total lbs lost to date: 0 Total lbs lost since last in-office visit: +5 LBS  Interim History:  03/30/22 ED visit for Viral URI 04/05/22 PCP visit- treated with prednisone 05/05/22 PCP- treated with pseudoephedrine, referred to ENT- OV scheduled on 07/05/22 She feels that she has gained weight due to acute sx's and prednisone therapy.  She is currently experiencing: HA Nasal congestion Bilateral tinnitus  Subjective:   1. Viral upper respiratory infection 03/30/22 ED Notes- Patient presents with several different chief complaints today.  Patient reports that she has had nasal congestion, cough, feeling of "chest heaviness" that started about 4 days ago.  Patient denies any known sick contacts or fevers at home.  Patient denies chest pain or shortness of breath but states that just feels "heavy" on her chest.  Denies sore throat, ear pain, nausea, vomiting, diarrhea, abdominal pain.  Patient has taken over-the-counter Coricidin with minimal improvement in symptoms.  Patient denies formal diagnosis of asthma or COPD.  Patient also reporting a white coating on her tongue that started about 2 days ago.  She denies pain or itchiness associated.  Denies any recent antibiotic or steroid therapy.  States that she went to her dentist a few days ago but they did not find any abnormalities.  Denies history of thrush.   DG Chest 2 View   Result Date: 03/30/2022 CLINICAL DATA:  Cough, chest heaviness EXAM: CHEST - 2 VIEW COMPARISON:  06/05/2021, 10/05/2021 FINDINGS: Frontal  and lateral views of the chest demonstrate an unremarkable cardiac silhouette. Stable enlargement and ectasia of the thoracic aorta compatible with known ascending thoracic aortic aneurysm. No acute airspace disease, effusion, or pneumothorax. Stable multilevel thoracic spondylosis and hypertrophic changes at the bilateral costovertebral junctions. IMPRESSION: 1. No acute airspace disease. 2. Stable enlargement and ectasia of the thoracic aorta consistent with known thoracic aortic aneurysm. Electronically Signed   By: Randa Ngo M.D.   On: 03/30/2022 15:15      Assessment/Plan:   1. Viral upper respiratory infection Increase fluids, increase citrus foods, follow-up with ENT as directed.  2. Obesity, current BMI 40.7 Docie is currently in the action stage of change. As such, her goal is to continue with weight loss efforts. She has agreed to the Category 2 Plan.   Exercise goals:  Increase activity as tolerated.  Behavioral modification strategies: increasing lean protein intake, decreasing simple carbohydrates, meal planning and cooking strategies, keeping healthy foods in the home, and planning for success.  Adrain has agreed to follow-up with our clinic in 2 weeks. She was informed of the importance of frequent follow-up visits to maximize her success with intensive lifestyle modifications for her multiple health conditions.   Objective:   Blood pressure 121/80, pulse 65, temperature 97.6 F (36.4 C), height '5\' 8"'$  (1.727 m), weight 268 lb (121.6 kg), SpO2 99 %. Body mass index is 40.75 kg/m.  General: Cooperative, alert, well developed, in no acute distress. HEENT: Conjunctivae and lids unremarkable. Cardiovascular: Regular rhythm.  Lungs: Normal work of breathing. Neurologic: No focal deficits.   Lab Results  Component  Value Date   CREATININE 1.02 (H) 02/18/2022   BUN 20 02/18/2022   NA 138 02/18/2022   K 4.1 02/18/2022   CL 103 02/18/2022   CO2 31 02/18/2022   Lab  Results  Component Value Date   ALT 12 02/18/2022   AST 20 02/18/2022   ALKPHOS 66 02/18/2022   BILITOT 0.8 02/18/2022   Lab Results  Component Value Date   HGBA1C 5.9 (H) 01/07/2022   HGBA1C 5.6 11/27/2019   Lab Results  Component Value Date   INSULIN 6.7 01/07/2022   INSULIN 9.2 11/27/2019   Lab Results  Component Value Date   TSH 1.480 01/07/2022   Lab Results  Component Value Date   CHOL 159 01/07/2022   HDL 72 01/07/2022   LDLCALC 75 01/07/2022   TRIG 59 01/07/2022   CHOLHDL 2.2 01/07/2022   Lab Results  Component Value Date   VD25OH 75.5 01/07/2022   VD25OH 89.78 07/18/2020   VD25OH 68.6 11/27/2019   Lab Results  Component Value Date   WBC 5.4 05/31/2022   HGB 13.7 05/31/2022   HCT 41.6 05/31/2022   MCV 90.0 05/31/2022   PLT 183 05/31/2022   Lab Results  Component Value Date   IRON 108 11/27/2019   TIBC 250 11/27/2019   FERRITIN 99 11/27/2019   Attestation Statements:   Reviewed by clinician on day of visit: allergies, medications, problem list, medical history, surgical history, family history, social history, and previous encounter notes.  I, Davy Pique, RMA, am acting as Location manager for Mina Marble, NP.  I have reviewed the above documentation for accuracy and completeness, and I agree with the above. -  Immanuel Fedak d. Bronsyn Shappell, NP-C

## 2022-06-01 LAB — KAPPA/LAMBDA LIGHT CHAINS
Kappa free light chain: 16.1 mg/L (ref 3.3–19.4)
Kappa, lambda light chain ratio: 1.45 (ref 0.26–1.65)
Lambda free light chains: 11.1 mg/L (ref 5.7–26.3)

## 2022-06-02 DIAGNOSIS — R262 Difficulty in walking, not elsewhere classified: Secondary | ICD-10-CM | POA: Diagnosis not present

## 2022-06-02 DIAGNOSIS — M25561 Pain in right knee: Secondary | ICD-10-CM | POA: Diagnosis not present

## 2022-06-02 DIAGNOSIS — M1711 Unilateral primary osteoarthritis, right knee: Secondary | ICD-10-CM | POA: Diagnosis not present

## 2022-06-02 DIAGNOSIS — M25661 Stiffness of right knee, not elsewhere classified: Secondary | ICD-10-CM | POA: Diagnosis not present

## 2022-06-04 LAB — MULTIPLE MYELOMA PANEL, SERUM
Albumin SerPl Elph-Mcnc: 3.5 g/dL (ref 2.9–4.4)
Albumin/Glob SerPl: 1.3 (ref 0.7–1.7)
Alpha 1: 0.2 g/dL (ref 0.0–0.4)
Alpha2 Glob SerPl Elph-Mcnc: 0.9 g/dL (ref 0.4–1.0)
B-Globulin SerPl Elph-Mcnc: 0.9 g/dL (ref 0.7–1.3)
Gamma Glob SerPl Elph-Mcnc: 0.8 g/dL (ref 0.4–1.8)
Globulin, Total: 2.9 g/dL (ref 2.2–3.9)
IgA: 127 mg/dL (ref 87–352)
IgG (Immunoglobin G), Serum: 949 mg/dL (ref 586–1602)
IgM (Immunoglobulin M), Srm: 47 mg/dL (ref 26–217)
Total Protein ELP: 6.4 g/dL (ref 6.0–8.5)

## 2022-06-05 DIAGNOSIS — H04123 Dry eye syndrome of bilateral lacrimal glands: Secondary | ICD-10-CM | POA: Diagnosis not present

## 2022-06-05 DIAGNOSIS — H43393 Other vitreous opacities, bilateral: Secondary | ICD-10-CM | POA: Diagnosis not present

## 2022-06-05 DIAGNOSIS — H524 Presbyopia: Secondary | ICD-10-CM | POA: Diagnosis not present

## 2022-06-10 ENCOUNTER — Inpatient Hospital Stay: Payer: Medicare PPO | Admitting: Hematology and Oncology

## 2022-06-10 ENCOUNTER — Encounter: Payer: Self-pay | Admitting: Hematology and Oncology

## 2022-06-10 DIAGNOSIS — M25562 Pain in left knee: Secondary | ICD-10-CM | POA: Diagnosis not present

## 2022-06-10 DIAGNOSIS — C9001 Multiple myeloma in remission: Secondary | ICD-10-CM | POA: Diagnosis not present

## 2022-06-10 DIAGNOSIS — M25561 Pain in right knee: Secondary | ICD-10-CM | POA: Diagnosis not present

## 2022-06-10 DIAGNOSIS — R262 Difficulty in walking, not elsewhere classified: Secondary | ICD-10-CM | POA: Diagnosis not present

## 2022-06-10 DIAGNOSIS — M170 Bilateral primary osteoarthritis of knee: Secondary | ICD-10-CM | POA: Diagnosis not present

## 2022-06-10 NOTE — Progress Notes (Signed)
HEMATOLOGY-ONCOLOGY ELECTRONIC VISIT PROGRESS NOTE  Patient Care Team: Lucianne Lei, MD as PCP - General (Family Medicine) Lorretta Harp, MD as PCP - Cardiology (Cardiology) Jodi Marble, MD as Consulting Physician (Otolaryngology) Heath Lark, MD as Consulting Physician (Hematology and Oncology)  I connected with the patient via telephone conference and verified that I am speaking with the correct person using two identifiers. The patient's location is at home and I am providing care from the [facility] I discussed the limitations, risks, security and privacy concerns of performing an evaluation and management service by e-visits and the availability of in person appointments.  I also discussed with the patient that there may be a patient responsible charge related to this service. The patient expressed understanding and agreed to proceed.   ASSESSMENT & PLAN:  No problem-specific Assessment & Plan notes found for this encounter.   No orders of the defined types were placed in this encounter.   INTERVAL HISTORY: Please see below for problem oriented charting. The purpose of today's discussion is to review test results The patient has completed treatment with Pomalyst, discontinue April 2023 She had no recent bone pain or recent infection No recent signs or symptoms to suggest cancer recurrence  SUMMARY OF ONCOLOGIC HISTORY: Oncology History  Multiple myeloma in remission ([place])  03/22/2016 Bone Marrow Biopsy   Bone Marrow Biopsy: Plasma cells 17% by Aspirate and 30% by CD 138 stain. Plasma cell neoplasm, Kappa Restricted Normal cytogenetics, FISH positive for +11 and +14 and +7   04/13/2016 Imaging   Skeletal survey showed lucencies within the calvarium worrisome for myeloma. No definite abnormal lytic or blastic lesions are observed elsewhere.   04/15/2016 Cancer Staging   Staging form: Multiple Myeloma, AJCC 6th Edition - Clinical stage from 04/15/2016: Stage IIA  - Signed by Heath Lark, MD on 07/31/2021 Staged by: Managing physician Diagnostic confirmation: Positive histology Specimen type: Biopsy / Limited Resection Histopathologic type: Neoplasm, malignant   04/19/2016 - 09/10/2016 Chemotherapy   The patient consented to treatment with Revlimid, dexamethasone and Velcade   09/14/2016 Bone Marrow Biopsy   In summary, there is a normal cellular marrow with trilineage hematopoiesis.  A mild plasmacytosis is present, but there is no definitive evidence of residual multiple myeloma   10/12/2016 - 10/12/2016 Chemotherapy   She received melphalan as conditioning chemo   10/13/2016 Bone Marrow Transplant   She received autologous stem cell transplant at [facility]   01/24/2017 Bone Marrow Biopsy   BONE MARROW: -          Normocellular marrow for age (50%) with trilineage hematopoiesis. -     No morphologic or immunohistochemical evidence of involvement by plasma cell neoplasm (see comment)  PERIPHERAL BLOOD: -          Unremarkable (see CBC data)  COMMENT: The bone marrow is normocellular for age and shows adequate trilineage hematopoiesis without significant (<10%) dysplastic changes present. Blasts are not increased. Plasma cells represent about 2% of total cells on aspirate smears. Appropriately controlled immunohistochemical stains are performed on the core biopsy. CD138 highlights small interstitial plasma cells comprising approximately 2% of total cells. These plasma cells appear polytypic as demonstrated by kappa and lambda in-situ hybridization.   01/25/2017 PET scan   No FDG avid osseous lesions or masses are identified.   02/18/2017 - 04/01/2017 Chemotherapy   She received weekly Velcade, Pomalyst and Dexamethasone   04/15/2017 - 08/28/2021 Chemotherapy   The patient had Pomalyst only   04/20/2017 Bone Marrow  Biopsy   Bone Marrow (BM) and Peripheral Blood (PB) FINAL PATHOLOGIC DIAGNOSIS BONE MARROW: Normocellular bone marrow (40%) with  normal numbers of megakaryocytes, erythroid hyperplasia and no increase in plasma cells (1%).   10/20/2021 Imaging   Bone density is normal     REVIEW OF SYSTEMS:   Constitutional: Denies fevers, chills or abnormal weight loss Eyes: Denies blurriness of vision Ears, nose, mouth, throat, and face: Denies mucositis or sore throat Respiratory: Denies cough, dyspnea or wheezes Cardiovascular: Denies palpitation, chest discomfort Gastrointestinal:  Denies nausea, heartburn or change in bowel habits Skin: Denies abnormal skin rashes Lymphatics: Denies new lymphadenopathy or easy bruising Neurological:Denies numbness, tingling or new weaknesses Behavioral/Psych: Mood is stable, no new changes  Extremities: No lower extremity edema All other systems were reviewed with the patient and are negative.  I have reviewed the past medical history, past surgical history, social history and family history with the patient and they are unchanged from previous note.  ALLERGIES:  is allergic to revlimid [lenalidomide].  MEDICATIONS:  Current Outpatient Medications  Medication Sig Dispense Refill   acetaminophen (TYLENOL) 500 MG tablet Take 500 mg by mouth every 6 (six) hours as needed.     albuterol (VENTOLIN HFA) 108 (90 Base) MCG/ACT inhaler Inhale 1-2 puffs into the lungs every 6 (six) hours as needed for wheezing or shortness of breath. 1 each 0   ASPIRIN 81 PO Take 81 mg by mouth daily.     Calcium Carbonate-Vitamin D (CALCIUM PLUS VITAMIN D PO) Take 1 tablet daily by mouth.     Chlorphen-PE-Acetaminophen (NOREL AD) 4-10-325 MG TABS Take by mouth.     docusate sodium (COLACE) 100 MG capsule Take 200 mg by mouth daily.     fluticasone (FLONASE) 50 MCG/ACT nasal spray Place into both nostrils daily as needed for allergies or rhinitis.     losartan-hydrochlorothiazide (HYZAAR) 100-25 MG tablet Take 1 tablet by mouth daily. (Patient taking differently: Take 0.5 tablets by mouth daily. TAKES 1/2 TABLET  DAILY) 90 tablet 3   montelukast (SINGULAIR) 10 MG tablet      Multiple Vitamin (MULTIVITAMIN) tablet Take 1 tablet by mouth daily.     Polyethylene Glycol 3350 (MIRALAX PO) Take by mouth as needed.     pseudoephedrine (SUDAFED) 120 MG 12 hr tablet Take 120 mg by mouth 2 (two) times daily.     rosuvastatin (CRESTOR) 10 MG tablet Take 10 mg by mouth as directed. One tablet before bedtime.     No current facility-administered medications for this visit.    PHYSICAL EXAMINATION: ECOG PERFORMANCE STATUS: 0 - Asymptomatic  LABORATORY DATA:  I have reviewed the data as listed    Latest Ref Rng & Units 05/31/2022   12:58 PM 02/18/2022    8:11 AM 01/15/2022    8:37 AM  CMP  Glucose 70 - 99 mg/dL 73  98  62   BUN 8 - 23 mg/dL '20  20  22   '$ Creatinine 0.44 - 1.00 mg/dL 0.95  1.02  1.06   Sodium 135 - 145 mmol/L 136  138  141   Potassium 3.5 - 5.1 mmol/L 3.9  4.1  3.8   Chloride 98 - 111 mmol/L 100  103  105   CO2 22 - 32 mmol/L 32  31  32   Calcium 8.9 - 10.3 mg/dL 9.9  9.2  9.7   Total Protein 6.5 - 8.1 g/dL 7.1  7.1  7.3   Total Bilirubin 0.3 - 1.2 mg/dL 0.7  0.8  0.7   Alkaline Phos 38 - 126 U/L 88  66  76   AST 15 - 41 U/L '21  20  23   '$ ALT 0 - 44 U/L '15  12  16     '$ Lab Results  Component Value Date   WBC 5.4 05/31/2022   HGB 13.7 05/31/2022   HCT 41.6 05/31/2022   MCV 90.0 05/31/2022   PLT 183 05/31/2022   NEUTROABS 3.7 05/31/2022    I discussed the assessment and treatment plan with the patient. The patient was provided an opportunity to ask questions and all were answered. The patient agreed with the plan and demonstrated an understanding of the instructions. The patient was advised to call back or seek an in-person evaluation if the symptoms worsen or if the condition fails to improve as anticipated.    I spent 20 minutes for the appointment reviewing test results, discuss management and coordination of care.  Heath Lark, MD 06/10/2022 3:30 PM

## 2022-06-16 ENCOUNTER — Ambulatory Visit (INDEPENDENT_AMBULATORY_CARE_PROVIDER_SITE_OTHER): Payer: Medicare PPO | Admitting: Adult Health

## 2022-06-16 ENCOUNTER — Encounter (INDEPENDENT_AMBULATORY_CARE_PROVIDER_SITE_OTHER): Payer: Self-pay | Admitting: Adult Health

## 2022-06-16 VITALS — BP 124/84 | HR 60 | Temp 97.7°F | Ht 68.0 in | Wt 272.0 lb

## 2022-06-16 DIAGNOSIS — R601 Generalized edema: Secondary | ICD-10-CM | POA: Diagnosis not present

## 2022-06-16 DIAGNOSIS — Z6841 Body Mass Index (BMI) 40.0 and over, adult: Secondary | ICD-10-CM | POA: Diagnosis not present

## 2022-06-16 DIAGNOSIS — E669 Obesity, unspecified: Secondary | ICD-10-CM

## 2022-06-17 DIAGNOSIS — R262 Difficulty in walking, not elsewhere classified: Secondary | ICD-10-CM | POA: Diagnosis not present

## 2022-06-17 DIAGNOSIS — M1712 Unilateral primary osteoarthritis, left knee: Secondary | ICD-10-CM | POA: Diagnosis not present

## 2022-06-17 DIAGNOSIS — M25562 Pain in left knee: Secondary | ICD-10-CM | POA: Diagnosis not present

## 2022-06-17 DIAGNOSIS — M25662 Stiffness of left knee, not elsewhere classified: Secondary | ICD-10-CM | POA: Diagnosis not present

## 2022-06-18 ENCOUNTER — Telehealth: Payer: Self-pay | Admitting: Hematology and Oncology

## 2022-06-18 NOTE — Telephone Encounter (Signed)
Scheduled appointments per 1/25 los. Patient is aware of the made appointments.

## 2022-06-24 DIAGNOSIS — R262 Difficulty in walking, not elsewhere classified: Secondary | ICD-10-CM | POA: Diagnosis not present

## 2022-06-24 DIAGNOSIS — M25561 Pain in right knee: Secondary | ICD-10-CM | POA: Diagnosis not present

## 2022-06-24 DIAGNOSIS — M25562 Pain in left knee: Secondary | ICD-10-CM | POA: Diagnosis not present

## 2022-06-24 DIAGNOSIS — M170 Bilateral primary osteoarthritis of knee: Secondary | ICD-10-CM | POA: Diagnosis not present

## 2022-06-30 NOTE — Progress Notes (Signed)
Chief Complaint:   OBESITY Carolyn Newman is here to discuss her progress with her obesity treatment plan along with follow-up of her obesity related diagnoses. Carolyn Newman is on the Category 2 Plan and states she is following her eating plan approximately 30% of the time. Carolyn Newman states she is very little due to sore knees.  Today's visit was #: 7 Starting weight: 258 LBS Starting date: 01/07/2022 Today's weight: 272 LBS Today's date: 06/16/2022 Total lbs lost to date: 0 Total lbs lost since last in-office visit: +4 LBS  Interim History:  She has f/u with Oncology 06/10/22- reviewed encounter notes and labs. Most notably Multiple Myeloma Panel- WNL  She denies any acute URI sx's at present.  She endorses increased generalized edema.  Subjective:   1. Generalized edema Patient's water weight was 4.4 LBS increased since last OV. Patient denies shortness of breath/dyspnea. She is on Hyzaar 100/51m- 1/2 tabQD  Assessment/Plan:   1. Generalized edema Increase water intake and decrease sodium intake. Elevated lower extremities when able. Appropriately sized compression socks.  2. Obesity, current BMI 41.4 Carolyn Newman currently in the action stage of change. As such, her goal is to continue with weight loss efforts. She has agreed to the Category 2 Plan.   Exercise goals:  Increase activity as tolerated.  Behavioral modification strategies: increasing lean protein intake, decreasing simple carbohydrates, increasing water intake, decreasing sodium intake, meal planning and cooking strategies, keeping healthy foods in the home, and planning for success.  Carolyn Newman agreed to follow-up with our clinic in 4 weeks. She was informed of the importance of frequent follow-up visits to maximize her success with intensive lifestyle modifications for her multiple health conditions.   Objective:   Blood pressure 124/84, pulse 60, temperature 97.7 F (36.5 C), height 5' 8"$  (1.727 m), weight 272  lb (123.4 kg), SpO2 99 %. Body mass index is 41.36 kg/m.  General: Cooperative, alert, well developed, in no acute distress. HEENT: Conjunctivae and lids unremarkable. Cardiovascular: Regular rhythm.  Lungs: Normal work of breathing. Neurologic: No focal deficits.   Lab Results  Component Value Date   CREATININE 0.95 05/31/2022   BUN 20 05/31/2022   NA 136 05/31/2022   K 3.9 05/31/2022   CL 100 05/31/2022   CO2 32 05/31/2022   Lab Results  Component Value Date   ALT 15 05/31/2022   AST 21 05/31/2022   ALKPHOS 88 05/31/2022   BILITOT 0.7 05/31/2022   Lab Results  Component Value Date   HGBA1C 5.9 (H) 01/07/2022   HGBA1C 5.6 11/27/2019   Lab Results  Component Value Date   INSULIN 6.7 01/07/2022   INSULIN 9.2 11/27/2019   Lab Results  Component Value Date   TSH 1.480 01/07/2022   Lab Results  Component Value Date   CHOL 159 01/07/2022   HDL 72 01/07/2022   LDLCALC 75 01/07/2022   TRIG 59 01/07/2022   CHOLHDL 2.2 01/07/2022   Lab Results  Component Value Date   VD25OH 75.5 01/07/2022   VD25OH 89.78 07/18/2020   VD25OH 68.6 11/27/2019   Lab Results  Component Value Date   WBC 5.4 05/31/2022   HGB 13.7 05/31/2022   HCT 41.6 05/31/2022   MCV 90.0 05/31/2022   PLT 183 05/31/2022   Lab Results  Component Value Date   IRON 108 11/27/2019   TIBC 250 11/27/2019   FERRITIN 99 11/27/2019    Attestation Statements:   Reviewed by clinician on day of visit: allergies, medications, problem list,  medical history, surgical history, family history, social history, and previous encounter notes.  I, Davy Pique, RMA, am acting as Location manager for Mina Marble, NP.  I have reviewed the above documentation for accuracy and completeness, and I agree with the above. -  Leandrew Keech d. Tarina Volk, NP-C

## 2022-07-01 DIAGNOSIS — R262 Difficulty in walking, not elsewhere classified: Secondary | ICD-10-CM | POA: Diagnosis not present

## 2022-07-01 DIAGNOSIS — M25561 Pain in right knee: Secondary | ICD-10-CM | POA: Diagnosis not present

## 2022-07-01 DIAGNOSIS — M170 Bilateral primary osteoarthritis of knee: Secondary | ICD-10-CM | POA: Diagnosis not present

## 2022-07-01 DIAGNOSIS — M25562 Pain in left knee: Secondary | ICD-10-CM | POA: Diagnosis not present

## 2022-07-05 DIAGNOSIS — H9313 Tinnitus, bilateral: Secondary | ICD-10-CM | POA: Diagnosis not present

## 2022-07-05 DIAGNOSIS — J329 Chronic sinusitis, unspecified: Secondary | ICD-10-CM | POA: Diagnosis not present

## 2022-07-05 DIAGNOSIS — H903 Sensorineural hearing loss, bilateral: Secondary | ICD-10-CM | POA: Diagnosis not present

## 2022-07-05 DIAGNOSIS — H9193 Unspecified hearing loss, bilateral: Secondary | ICD-10-CM | POA: Diagnosis not present

## 2022-07-08 ENCOUNTER — Ambulatory Visit (INDEPENDENT_AMBULATORY_CARE_PROVIDER_SITE_OTHER): Payer: Medicare PPO | Admitting: Adult Health

## 2022-07-08 ENCOUNTER — Encounter (INDEPENDENT_AMBULATORY_CARE_PROVIDER_SITE_OTHER): Payer: Self-pay | Admitting: Adult Health

## 2022-07-08 DIAGNOSIS — N1831 Chronic kidney disease, stage 3a: Secondary | ICD-10-CM

## 2022-07-08 DIAGNOSIS — J069 Acute upper respiratory infection, unspecified: Secondary | ICD-10-CM | POA: Diagnosis not present

## 2022-07-08 DIAGNOSIS — M25562 Pain in left knee: Secondary | ICD-10-CM | POA: Diagnosis not present

## 2022-07-08 DIAGNOSIS — E669 Obesity, unspecified: Secondary | ICD-10-CM

## 2022-07-08 DIAGNOSIS — Z6841 Body Mass Index (BMI) 40.0 and over, adult: Secondary | ICD-10-CM | POA: Diagnosis not present

## 2022-07-08 DIAGNOSIS — M1712 Unilateral primary osteoarthritis, left knee: Secondary | ICD-10-CM | POA: Diagnosis not present

## 2022-07-08 DIAGNOSIS — R262 Difficulty in walking, not elsewhere classified: Secondary | ICD-10-CM | POA: Diagnosis not present

## 2022-07-08 DIAGNOSIS — M25662 Stiffness of left knee, not elsewhere classified: Secondary | ICD-10-CM | POA: Diagnosis not present

## 2022-07-08 DIAGNOSIS — R7303 Prediabetes: Secondary | ICD-10-CM

## 2022-07-08 NOTE — Progress Notes (Signed)
Chief Complaint:   OBESITY Carolyn Newman is here to discuss her progress with her obesity treatment plan along with follow-up of her obesity related diagnoses. Carolyn Newman is on the Category 2 Plan and states she is following her eating plan approximately 70% of the time.  Carolyn Newman states she is remaining active as possible during ABX tx for recurrent sinusitis.   Today's visit was #: 8- RESTART TO PROGRAM Starting weight: 281 lbs Starting date: 11/27/19, THEN 01/07/22 Today's weight: 268 lbs Today's date: 07/08/2022 Total lbs lost to date: 13 Total lbs lost since last in-office visit: - 4 lbs  Interim History:  Reviewed Bioempedenc Results with pt: Muscle Mass + 3.8 lbs Adipose Mass - 8.6 lbs  She recently established with Dr. Almyra Deforest with [facility]  She is on day 4 of 14 day course of Doxycyline.  She reports "feeling much better".  She denies family hx of MEN 2 or MTC. She denies personal hx of pancreatitis.  Oncology recently ran labs-reviewed with pt- GREAT improvement in kidney fx!  Subjective:   1. Chronic kidney disease, stage 3a ([place]) Discussed labs- IMPROVED  Latest Reference Range & Units 05/31/22 12:58  GFR, Estimated >60 mL/min >60  Creat 0.95  2. Viral upper respiratory infection 06/1922 OV with ENT/Dr. Spainhour/ [facility]  Procedures: Procedure Note -flexible nasal Endoscopy  Risks/benefits and possible complications of this procedure were discussed in detail and the patient understood and agreed to proceed.  The nose was sprayed with oxymetazoline and 4% lidocaine. With the patient in the upright position, the flexible endscope was inserted into the nasal passage bilaterlly. Where visible, nasal secretions and mucosal crusting were removed with suction. The overall appearance of the nasal cavity and paranasal sinuses were noted and the findings are described below.  Findings: Cloudy mucus emanating from right ethmoidal/middle meatal region. Specimen  obtained for culture and sensitivity  The patient tolerated the procedure without difficulty and was discharged in stable condition.  Carolyn Eng. Spainhour, PA-C [facility]  Impression & Plans:  1) recurrent sinusitis 2) sensorineural hearing loss-bilateral 3) tinnitus-bilateral  Hearing protection discussed Masking techniques discussed Culture specimen is obtained as described above Start doxycycline 100 mg 1 p.o. twice daily Saline irrigations daily Return to clinic 3 to 4 weeks with CT scan of sinuses first   3. Prediabetes Lab Results  Component Value Date   HGBA1C 5.9 (H) 01/07/2022   HGBA1C 5.6 11/27/2019    She denies family hx of MEN 2 or MTC. She denies personal hx of pancreatitis.  Assessment/Plan:   1. Chronic kidney disease, stage 3a ([place]) Remain well hydrated Continue to avoid all nephrotoxic substances  2. Viral upper respiratory infection Remain well hydrated Complete course of ABX F/u Dr. Almyra Deforest as directed  3. Prediabetes Continue to limit sugar/CHO and increase protein. Call insurance and inquire what/is any anti- obesity medication is covered. Already discussed that Phentermine is NOT appropriate therapy for her.  4. Obesity, current BMI 40.7  [place] is currently in the action stage of change. As such, her goal is to continue with weight loss efforts. She has agreed to the Category 2 Plan.   Exercise goals: Older adults should follow the adult guidelines. When older adults cannot meet the adult guidelines, they should be as physically active as their abilities and conditions will allow.   Behavioral modification strategies: increasing lean protein intake, decreasing simple carbohydrates, increasing vegetables, increasing water intake, meal planning and cooking strategies, and planning for success.  Carolyn Newman has  agreed to follow-up with our clinic in 3 weeks. She was informed of the importance of frequent follow-up visits to maximize her success  with intensive lifestyle modifications for her multiple health conditions.   Fasting Labs at Next OV  Objective:   Blood pressure 110/76, pulse 68, temperature 98.3 F (36.8 C), height 5' 8"$  (1.727 m), weight 268 lb (121.6 kg), SpO2 98 %. Body mass index is 40.75 kg/m.  General: Cooperative, alert, well developed, in no acute distress. HEENT: Conjunctivae and lids unremarkable. Cardiovascular: Regular rhythm.  Lungs: Normal work of breathing. Neurologic: No focal deficits.   Lab Results  Component Value Date   CREATININE 0.95 05/31/2022   BUN 20 05/31/2022   NA 136 05/31/2022   K 3.9 05/31/2022   CL 100 05/31/2022   CO2 32 05/31/2022   Lab Results  Component Value Date   ALT 15 05/31/2022   AST 21 05/31/2022   ALKPHOS 88 05/31/2022   BILITOT 0.7 05/31/2022   Lab Results  Component Value Date   HGBA1C 5.9 (H) 01/07/2022   HGBA1C 5.6 11/27/2019   Lab Results  Component Value Date   INSULIN 6.7 01/07/2022   INSULIN 9.2 11/27/2019   Lab Results  Component Value Date   TSH 1.480 01/07/2022   Lab Results  Component Value Date   CHOL 159 01/07/2022   HDL 72 01/07/2022   LDLCALC 75 01/07/2022   TRIG 59 01/07/2022   CHOLHDL 2.2 01/07/2022   Lab Results  Component Value Date   VD25OH 75.5 01/07/2022   VD25OH 89.78 07/18/2020   VD25OH 68.6 11/27/2019   Lab Results  Component Value Date   WBC 5.4 05/31/2022   HGB 13.7 05/31/2022   HCT 41.6 05/31/2022   MCV 90.0 05/31/2022   PLT 183 05/31/2022   Lab Results  Component Value Date   IRON 108 11/27/2019   TIBC 250 11/27/2019   FERRITIN 99 11/27/2019   Attestation Statements:   Reviewed by clinician on day of visit: allergies, medications, problem list, medical history, surgical history, family history, social history, and previous encounter notes.  I have reviewed the above documentation for accuracy and completeness, and I agree with the above. -  Mendell Bontempo d. Vallerie Hentz, NP-C

## 2022-07-09 ENCOUNTER — Ambulatory Visit
Admission: EM | Admit: 2022-07-09 | Discharge: 2022-07-09 | Disposition: A | Discharge status: Home / Self Care | Payer: Medicare PPO | Attending: Internal Medicine | Admitting: Internal Medicine

## 2022-07-09 ENCOUNTER — Ambulatory Visit (INDEPENDENT_AMBULATORY_CARE_PROVIDER_SITE_OTHER): Payer: Medicare PPO

## 2022-07-09 DIAGNOSIS — R051 Acute cough: Secondary | ICD-10-CM

## 2022-07-09 DIAGNOSIS — J069 Acute upper respiratory infection, unspecified: Secondary | ICD-10-CM | POA: Diagnosis not present

## 2022-07-09 DIAGNOSIS — R059 Cough, unspecified: Secondary | ICD-10-CM

## 2022-07-09 MED ORDER — BENZONATATE 100 MG PO CAPS: 100.0000 mg | ORAL_CAPSULE | Freq: Three times a day (TID) | ORAL | 0 refills | Status: DC | PRN

## 2022-07-09 NOTE — ED Provider Notes (Signed)
[facility]    CSN: [id number] Arrival date & time: 07/09/22  1701      History   Chief Complaint Chief Complaint  Patient presents with   Cough    HPI Carolyn Newman is a 72 y.o. female.   Patient presents with cough and nasal congestion that has been present for over 1 week.  Patient was seen by ENT and treated with doxycycline.  Nasal culture was done by ENT that was positive for H. influenzae.  She states that she has been taking doxycycline with minimal improvement.  Cough has become productive.  She denies any known fevers at home.  Has been taking over-the-counter cough medication as well with minimal improvement.  Denies chest pain or shortness of breath.   Cough   Past Medical History:  Diagnosis Date   Alopecia    Anemia    Anosmia    Back pain    Bilateral knee pain    Cancer (HCC)    myeloma   Chewing difficulty    Constipation    Elevated serum creatinine    Hyperlipidemia    Hypertension    Joint pain    Leukopenia    Multiple myeloma (HCC)    Neuropathy    Right and left feet    Obesity    SOB (shortness of breath)    Thoracic aortic aneurysm without rupture [facility])     Patient Active Problem List   Diagnosis Date Noted   Chronic kidney disease, stage 3a ([place]) 01/20/2022   SOB (shortness of breath) on exertion 01/07/2022   Vitamin D deficiency 01/07/2022   Essential hypertension 01/07/2022   Class 3 severe obesity with serious comorbidity and body mass index (BMI) of 40.0 to 44.9 in adult [facility]) 01/07/2022   Other fatigue 07/25/2020   Prediabetes 04/13/2020   Visceral obesity 0000000   Metabolic syndrome 0000000   Preventive measure 01/25/2020   Leukopenia due to antineoplastic chemotherapy ([place]) 01/11/2019   Other constipation 01/11/2019   Hyperlipidemia 06/23/2018   Thoracic aortic aneurysm without rupture ([place]) 05/19/2018   Anosmia 11/15/2017   Class 2 obesity due to excess calories with body mass index (BMI) of 39.0  to 39.9 in adult 08/18/2017   Hypotension due to drugs 04/15/2017   Sinus congestion 03/25/2017   S/P autologous bone marrow transplantation ([place]) 02/01/2017   Peripheral neuropathy due to chemotherapy ([place]) 07/13/2016   Physical debility 07/13/2016   Allergic rhinitis 07/02/2016   Rash due to allergy 07/02/2016   Elevated serum creatinine 05/22/2016   Financial difficulty 05/22/2016   Pancytopenia, acquired ([place]) 04/19/2016   Multiple myeloma in remission ([place]) 04/04/2016   Normocytic anemia 03/09/2016    Past Surgical History:  Procedure Laterality Date   CATARACT EXTRACTION     EYE SURGERY     INJECTION KNEE     LIMBAL STEM CELL TRANSPLANT      OB History     Gravida  0   Para  0   Term  0   Preterm  0   AB  0   Living  0      SAB  0   IAB  0   Ectopic  0   Multiple  0   Live Births  0            Home Medications    Prior to Admission medications   Medication Sig Start Date End Date Taking? Authorizing Provider  benzonatate (TESSALON) 100 MG capsule Take 1  capsule (100 mg total) by mouth every 8 (eight) hours as needed for cough. 07/09/22  Yes Esparanza Krider, Hildred Alamin E, FNP  acetaminophen (TYLENOL) 500 MG tablet Take 500 mg by mouth every 6 (six) hours as needed.    [provider]  albuterol (VENTOLIN HFA) 108 (90 Base) MCG/ACT inhaler Inhale 1-2 puffs into the lungs every 6 (six) hours as needed for wheezing or shortness of breath. 03/30/22   Teodora Medici, FNP  ASPIRIN 81 PO Take 81 mg by mouth daily.    [provider]  Chlorphen-PE-Acetaminophen (NOREL AD) 4-10-325 MG TABS Take by mouth.    [provider]  docusate sodium (COLACE) 100 MG capsule Take 200 mg by mouth daily.    [provider]  doxycycline (VIBRAMYCIN) 100 MG capsule Take by mouth. 07/05/22 07/19/22  [provider]  fluticasone (FLONASE) 50 MCG/ACT nasal spray Place into both nostrils daily as needed for allergies or rhinitis.    [provider]  losartan-hydrochlorothiazide (HYZAAR) 100-25 MG tablet Take 1 tablet by mouth daily. Patient taking differently: Take 0.5 tablets by mouth daily. TAKES 1/2 TABLET DAILY 04/26/16   Heath Lark, MD  montelukast (SINGULAIR) 10 MG tablet  10/15/21   [provider]  Multiple Vitamin (MULTIVITAMIN) tablet Take 1 tablet by mouth daily.    [provider]  Polyethylene Glycol 3350 (MIRALAX PO) Take by mouth as needed.    [provider]  pseudoephedrine (SUDAFED) 120 MG 12 hr tablet Take 120 mg by mouth 2 (two) times daily.    [provider]  rosuvastatin (CRESTOR) 10 MG tablet Take 10 mg by mouth as directed. One tablet before bedtime.    [provider]    Family History Family History  Problem Relation Age of Onset   Cancer Mother        lymphoma   Diabetes Mother    Hypertension Mother    Hyperlipidemia Mother    Depression Mother    Obesity Mother    Heart disease Mother    Cancer Father        prostate ca   Heart disease Father    Colon cancer Maternal Grandmother    Diabetes Maternal Grandfather     Social History Social History   Tobacco Use   Smoking status: Never   Smokeless tobacco: Never  Vaping Use   Vaping Use: Never used  Substance Use Topics   Alcohol use: No   Drug use: No     Allergies   Revlimid [lenalidomide]   Review of Systems Review of Systems Per HPI  Physical Exam Triage Vital Signs ED Triage Vitals  Enc Vitals Group     BP 07/09/22 1754 (!) 146/88     Pulse Rate 07/09/22 1754 67     Resp 07/09/22 1754 18     Temp 07/09/22 1754 98.1 F (36.7 C)     Temp Source 07/09/22 1754 Oral     SpO2 07/09/22 1754 96 %     Weight --      Height --      Head Circumference --      Peak Flow --      Pain Score 07/09/22 1755 0     Pain Loc --      Pain Edu? --      Excl. in [place]? --    No data found.  Updated Vital Signs BP (!) 146/88 (BP Location: Left Arm)   Pulse 67   Temp 98.1  F  (36.7 C) (Oral)   Resp 18   SpO2 96%   Visual Acuity Right Eye Distance:   Left Eye Distance:   Bilateral Distance:    Right Eye Near:   Left Eye Near:    Bilateral Near:     Physical Exam Constitutional:      General: She is not in acute distress.    Appearance: Normal appearance. She is not toxic-appearing or diaphoretic.  HENT:     Head: Normocephalic and atraumatic.     Right Ear: Tympanic membrane and ear canal normal.     Left Ear: Tympanic membrane and ear canal normal.     Nose: Congestion present.     Mouth/Throat:     Mouth: Mucous membranes are moist.     Pharynx: No posterior oropharyngeal erythema.  Eyes:     Extraocular Movements: Extraocular movements intact.     Conjunctiva/sclera: Conjunctivae normal.     Pupils: Pupils are equal, round, and reactive to light.  Cardiovascular:     Rate and Rhythm: Normal rate and regular rhythm.     Pulses: Normal pulses.     Heart sounds: Normal heart sounds.  Pulmonary:     Effort: Pulmonary effort is normal. No respiratory distress.     Breath sounds: Normal breath sounds. No stridor. No wheezing, rhonchi or rales.  Abdominal:     General: Abdomen is flat. Bowel sounds are normal.     Palpations: Abdomen is soft.  Musculoskeletal:        General: Normal range of motion.     Cervical back: Normal range of motion.  Skin:    General: Skin is warm and dry.  Neurological:     General: No focal deficit present.     Mental Status: She is alert and oriented to person, place, and time. Mental status is at baseline.  Psychiatric:        Mood and Affect: Mood normal.        Behavior: Behavior normal.      UC Treatments / Results  Labs (all labs ordered are listed, but only abnormal results are displayed) Labs Reviewed - No data to display  EKG   Radiology DG Chest 2 View  Result Date: 07/09/2022 CLINICAL DATA:  Cough EXAM: CHEST - 2 VIEW COMPARISON:  03/30/2022, CT 10/05/2021 FINDINGS: Lungs are clear. No  pneumothorax or pleural effusion. Cardiac size within normal limits. Thoracic aortic ectasia and tortuosity is stable. Pulmonary vascularity is normal. No acute bone abnormality. IMPRESSION: No active cardiopulmonary disease. Electronically Signed   By: Fidela Salisbury M.D.   On: 07/09/2022 18:20    Procedures Procedures (including critical care time)  Medications Ordered in UC Medications - No data to display  Initial Impression / Assessment and Plan / UC Course  I have reviewed the triage vital signs and the nursing notes.  Pertinent labs & imaging results that were available during my care of the patient were reviewed by me and considered in my medical decision making (see chart for details).     Patient's nasal culture was positive for H. influenza.  The patient's doxycycline that she is currently on should help treat this.  She has a 14-day regimen of this and advised her to continue this and complete this.  Chest x-ray completed given new onset productive and persistent cough.  It was negative for any acute cardiopulmonary process.  It does show stable aortic abnormality which is noted on previous imaging.  Cough medication prescribed  for patient.  Advised follow-up if symptoms persist or worsen.  Patient verbalized understanding and was agreeable with plan. Final Clinical Impressions(s) / UC Diagnoses   Final diagnoses:  Acute upper respiratory infection  Acute cough     Discharge Instructions      Continue and complete antibiotic.  Cough medication has been prescribed.  Follow-up with ENT, PCP, or urgent care if symptoms persist or worsen.     ED Prescriptions     Medication Sig Dispense Auth. Provider   benzonatate (TESSALON) 100 MG capsule Take 1 capsule (100 mg total) by mouth every 8 (eight) hours as needed for cough. 21 capsule Upton, Michele Rockers, Anthony      PDMP not reviewed this encounter.   Teodora Medici, [place] 07/09/22 1850

## 2022-07-09 NOTE — Discharge Instructions (Signed)
Continue and complete antibiotic.  Cough medication has been prescribed.  Follow-up with ENT, PCP, or urgent care if symptoms persist or worsen.

## 2022-07-09 NOTE — ED Triage Notes (Signed)
Pt c/o cough and congestion x1wk. States seen and tx'd from her PCP on Monday. States the dry cough is worse. States taking her Rx meds and OTC meds with no relief.

## 2022-07-27 ENCOUNTER — Other Ambulatory Visit: Payer: Self-pay

## 2022-07-27 ENCOUNTER — Encounter: Payer: Self-pay | Admitting: Emergency Medicine

## 2022-07-27 ENCOUNTER — Ambulatory Visit
Admission: EM | Admit: 2022-07-27 | Discharge: 2022-07-27 | Disposition: A | Discharge status: Home / Self Care | Payer: Medicare PPO | Attending: Family Medicine | Admitting: Family Medicine

## 2022-07-27 DIAGNOSIS — R051 Acute cough: Secondary | ICD-10-CM | POA: Insufficient documentation

## 2022-07-27 DIAGNOSIS — J0191 Acute recurrent sinusitis, unspecified: Secondary | ICD-10-CM | POA: Diagnosis not present

## 2022-07-27 DIAGNOSIS — Z1152 Encounter for screening for COVID-19: Secondary | ICD-10-CM | POA: Diagnosis not present

## 2022-07-27 DIAGNOSIS — J4521 Mild intermittent asthma with (acute) exacerbation: Secondary | ICD-10-CM | POA: Insufficient documentation

## 2022-07-27 LAB — SARS CORONAVIRUS 2 (TAT 6-24 HRS): SARS Coronavirus 2: NEGATIVE

## 2022-07-27 MED ORDER — ALBUTEROL SULFATE HFA 108 (90 BASE) MCG/ACT IN AERS: 2.0000 | INHALATION_SPRAY | RESPIRATORY_TRACT | 0 refills | Status: DC | PRN

## 2022-07-27 MED ORDER — PREDNISONE 20 MG PO TABS: 40.0000 mg | ORAL_TABLET | Freq: Every day | ORAL | 0 refills | Status: AC

## 2022-07-27 MED ORDER — BENZONATATE 100 MG PO CAPS: 100.0000 mg | ORAL_CAPSULE | Freq: Three times a day (TID) | ORAL | 0 refills | Status: DC | PRN

## 2022-07-27 MED ORDER — CEFDINIR 300 MG PO CAPS: 600.0000 mg | ORAL_CAPSULE | Freq: Every day | ORAL | 0 refills | Status: AC

## 2022-07-27 NOTE — ED Provider Notes (Signed)
[name]    CSN: [id number] Arrival date & time: 07/27/22  1040      History   Chief Complaint Chief Complaint  Patient presents with   Cough    HPI Carolyn Newman is a 72 y.o. female.    Cough  Here for cough and congestion.  She has had waxing and waning congestion and cough since the fall 2023.  In February of this year her ENT had done a sinus culture and had found H. influenzae.  She was treated with doxycycline and when she was not improving quickly she was seen here.  Chest x-ray was clear at that time.  She did finally improve though she continued to have some nasal congestion and a mild cough after she had finished the 2-week course of doxycycline.    Then on March 9, she worsened with a lot of cough.  She is bringing up yellow and green and clear mucus.  Also when she does her sinus washes she is getting green mucus out.  She had a slight fever a couple of days ago.  No vomiting or diarrhea.  She has had wheezing on exam previously and has been prescribed an albuterol inhaler in the past  Past medical history is significant for multiple myeloma successfully treated a few years ago.  She does not have diabetes  Past Medical History:  Diagnosis Date   Alopecia    Anemia    Anosmia    Back pain    Bilateral knee pain    Cancer (HCC)    myeloma   Chewing difficulty    Constipation    Elevated serum creatinine    Hyperlipidemia    Hypertension    Joint pain    Leukopenia    Multiple myeloma (HCC)    Neuropathy    Right and left feet    Obesity    SOB (shortness of breath)    Thoracic aortic aneurysm without rupture [facility])     Patient Active Problem List   Diagnosis Date Noted   Chronic kidney disease, stage 3a ([place]) 01/20/2022   SOB (shortness of breath) on exertion 01/07/2022   Vitamin D deficiency 01/07/2022   Essential hypertension 01/07/2022   Class 3 severe obesity with serious comorbidity and body mass index (BMI) of 40.0 to  44.9 in adult [facility]) 01/07/2022   Other fatigue 07/25/2020   Prediabetes 04/13/2020   Visceral obesity 0000000   Metabolic syndrome 0000000   Preventive measure 01/25/2020   Leukopenia due to antineoplastic chemotherapy ([place]) 01/11/2019   Other constipation 01/11/2019   Hyperlipidemia 06/23/2018   Thoracic aortic aneurysm without rupture ([place]) 05/19/2018   Anosmia 11/15/2017   Class 2 obesity due to excess calories with body mass index (BMI) of 39.0 to 39.9 in adult 08/18/2017   Hypotension due to drugs 04/15/2017   Sinus congestion 03/25/2017   S/P autologous bone marrow transplantation ([place]) 02/01/2017   Peripheral neuropathy due to chemotherapy ([place]) 07/13/2016   Physical debility 07/13/2016   Allergic rhinitis 07/02/2016   Rash due to allergy 07/02/2016   Elevated serum creatinine 05/22/2016   Financial difficulty 05/22/2016   Pancytopenia, acquired ([place]) 04/19/2016   Multiple myeloma in remission ([place]) 04/04/2016   Normocytic anemia 03/09/2016    Past Surgical History:  Procedure Laterality Date   CATARACT EXTRACTION     EYE SURGERY     INJECTION KNEE     LIMBAL STEM CELL TRANSPLANT      OB History  Gravida  0   Para  0   Term  0   Preterm  0   AB  0   Living  0      SAB  0   IAB  0   Ectopic  0   Multiple  0   Live Births  0            Home Medications    Prior to Admission medications   Medication Sig Start Date End Date Taking? Authorizing Provider  albuterol (VENTOLIN HFA) 108 (90 Base) MCG/ACT inhaler Inhale 2 puffs into the lungs every 4 (four) hours as needed for wheezing or shortness of breath. 07/27/22  Yes Linnell Swords, Gwenlyn Perking, MD  benzonatate (TESSALON) 100 MG capsule Take 1 capsule (100 mg total) by mouth 3 (three) times daily as needed for cough. 07/27/22  Yes Barrett Henle, MD  cefdinir (OMNICEF) 300 MG capsule Take 2 capsules (600 mg total) by mouth daily for 10 days. 07/27/22 08/06/22 Yes Lumi Winslett, Gwenlyn Perking, MD   predniSONE (DELTASONE) 20 MG tablet Take 2 tablets (40 mg total) by mouth daily with breakfast for 5 days. 07/27/22 08/01/22 Yes Arieonna Medine, Gwenlyn Perking, MD  acetaminophen (TYLENOL) 500 MG tablet Take 500 mg by mouth every 6 (six) hours as needed.    [provider]  ASPIRIN 81 PO Take 81 mg by mouth daily.    [provider]  Chlorphen-PE-Acetaminophen (NOREL AD) 4-10-325 MG TABS Take by mouth.    [provider]  docusate sodium (COLACE) 100 MG capsule Take 200 mg by mouth daily.    [provider]  fluticasone (FLONASE) 50 MCG/ACT nasal spray Place into both nostrils daily as needed for allergies or rhinitis.    [provider]  losartan-hydrochlorothiazide (HYZAAR) 100-25 MG tablet Take 1 tablet by mouth daily. Patient taking differently: Take 0.5 tablets by mouth daily. TAKES 1/2 TABLET DAILY 04/26/16   Heath Lark, MD  montelukast (SINGULAIR) 10 MG tablet  10/15/21   [provider]  Multiple Vitamin (MULTIVITAMIN) tablet Take 1 tablet by mouth daily.    [provider]  Polyethylene Glycol 3350 (MIRALAX PO) Take by mouth as needed.    [provider]  pseudoephedrine (SUDAFED) 120 MG 12 hr tablet Take 120 mg by mouth 2 (two) times daily.    [provider]  rosuvastatin (CRESTOR) 10 MG tablet Take 10 mg by mouth as directed. One tablet before bedtime.    [provider]    Family History Family History  Problem Relation Age of Onset   Cancer Mother        lymphoma   Diabetes Mother    Hypertension Mother    Hyperlipidemia Mother    Depression Mother    Obesity Mother    Heart disease Mother    Cancer Father        prostate ca   Heart disease Father    Colon cancer Maternal Grandmother    Diabetes Maternal Grandfather     Social History Social History   Tobacco Use   Smoking status: Never   Smokeless tobacco: Never  Vaping Use   Vaping Use: Never used  Substance Use Topics   Alcohol use:  No   Drug use: No     Allergies   Revlimid [lenalidomide]   Review of Systems Review of Systems  Respiratory:  Positive for cough.      Physical Exam Triage Vital Signs ED Triage Vitals [07/27/22 1144]  Enc Vitals  Group     BP 106/62     Pulse Rate 98     Resp 18     Temp 99.2 F (37.3 C)     Temp Source Oral     SpO2 97 %     Weight      Height      Head Circumference      Peak Flow      Pain Score 0     Pain Loc      Pain Edu?      Excl. in [place]?    No data found.  Updated Vital Signs BP 106/62 (BP Location: Left Arm)   Pulse 98   Temp 99.2 F (37.3 C) (Oral)   Resp 18   SpO2 97%   Visual Acuity Right Eye Distance:   Left Eye Distance:   Bilateral Distance:    Right Eye Near:   Left Eye Near:    Bilateral Near:     Physical Exam Vitals reviewed.  Constitutional:      General: She is not in acute distress.    Appearance: She is not ill-appearing, toxic-appearing or diaphoretic.  HENT:     Right Ear: Tympanic membrane and ear canal normal.     Left Ear: Tympanic membrane and ear canal normal.     Nose: Congestion present.     Mouth/Throat:     Mouth: Mucous membranes are moist.     Comments: There is clear mucus draining Eyes:     Extraocular Movements: Extraocular movements intact.     Conjunctiva/sclera: Conjunctivae normal.     Pupils: Pupils are equal, round, and reactive to light.  Cardiovascular:     Rate and Rhythm: Normal rate and regular rhythm.     Heart sounds: No murmur heard. Pulmonary:     Effort: Pulmonary effort is normal. No respiratory distress.     Breath sounds: No stridor. No rhonchi or rales.     Comments: There are expiratory wheezes heard bilaterally, mostly in the lower lobes.  Air movement is good Musculoskeletal:     Cervical back: Neck supple.  Lymphadenopathy:     Cervical: No cervical adenopathy.  Skin:    Capillary Refill: Capillary refill takes less than 2 seconds.     Coloration: Skin is not jaundiced or  pale.  Neurological:     General: No focal deficit present.     Mental Status: She is alert and oriented to person, place, and time.  Psychiatric:        Behavior: Behavior normal.      UC Treatments / Results  Labs (all labs ordered are listed, but only abnormal results are displayed) Labs Reviewed  SARS CORONAVIRUS 2 (TAT 6-24 HRS)    EKG   Radiology No results found.  Procedures Procedures (including critical care time)  Medications Ordered in UC Medications - No data to display  Initial Impression / Assessment and Plan / UC Course  I have reviewed the triage vital signs and the nursing notes.  Pertinent labs & imaging results that were available during my care of the patient were reviewed by me and considered in my medical decision making (see chart for details).        Since she had symptoms linger from the last treatment for sinusitis, I am going to treat with a different class of antibiotic for possible acute sinusitis.  We discussed that since this could be a new viral illness, the COVID swab is done.  If positive, she is a candidate for Paxlovid.  Her last EGFR was greater than 60.  She can stop her Crestor while she takes the Paxlovid, if positive.  She has a CT sinus set up for next week. Final Clinical Impressions(s) / UC Diagnoses   Final diagnoses:  Acute recurrent sinusitis, unspecified location  Acute cough  Mild intermittent asthma with acute exacerbation     Discharge Instructions      Take cefdinir 300 mg--2 capsules together daily for 7 days  Take benzonatate 100 mg, 1 tab every 8 hours as needed for cough.  Take prednisone 20 mg--2 daily for 5 days  Albuterol inhaler--do 2 puffs every 4 hours as needed for shortness of breath or wheezing   You have been swabbed for COVID, and the test will result in the next 24 hours. Our staff will call you if positive. If the COVID test is positive, you should quarantine until you are fever free  for 24 hours and you are starting to feel better     ED Prescriptions     Medication Sig Dispense Auth. Provider   cefdinir (OMNICEF) 300 MG capsule Take 2 capsules (600 mg total) by mouth daily for 10 days. 20 capsule Barrett Henle, MD   benzonatate (TESSALON) 100 MG capsule Take 1 capsule (100 mg total) by mouth 3 (three) times daily as needed for cough. 21 capsule Barrett Henle, MD   albuterol (VENTOLIN HFA) 108 (90 Base) MCG/ACT inhaler Inhale 2 puffs into the lungs every 4 (four) hours as needed for wheezing or shortness of breath. 1 each Barrett Henle, MD   predniSONE (DELTASONE) 20 MG tablet Take 2 tablets (40 mg total) by mouth daily with breakfast for 5 days. 10 tablet Windy Carina Gwenlyn Perking, MD      PDMP not reviewed this encounter.   Barrett Henle, MD 07/27/22 [phone]

## 2022-07-27 NOTE — Discharge Instructions (Addendum)
Take cefdinir 300 mg--2 capsules together daily for 7 days  Take benzonatate 100 mg, 1 tab every 8 hours as needed for cough.  Take prednisone 20 mg--2 daily for 5 days  Albuterol inhaler--do 2 puffs every 4 hours as needed for shortness of breath or wheezing   You have been swabbed for COVID, and the test will result in the next 24 hours. Our staff will call you if positive. If the COVID test is positive, you should quarantine until you are fever free for 24 hours and you are starting to feel better

## 2022-07-27 NOTE — ED Triage Notes (Signed)
Pt here for cough and congestion x 4 days; pt sts seen here for similar last month

## 2022-07-29 ENCOUNTER — Ambulatory Visit (INDEPENDENT_AMBULATORY_CARE_PROVIDER_SITE_OTHER): Payer: Medicare PPO | Admitting: Adult Health

## 2022-08-02 DIAGNOSIS — J3489 Other specified disorders of nose and nasal sinuses: Secondary | ICD-10-CM | POA: Diagnosis not present

## 2022-08-02 DIAGNOSIS — J324 Chronic pansinusitis: Secondary | ICD-10-CM | POA: Diagnosis not present

## 2022-08-02 DIAGNOSIS — J329 Chronic sinusitis, unspecified: Secondary | ICD-10-CM | POA: Diagnosis not present

## 2022-08-17 ENCOUNTER — Encounter (INDEPENDENT_AMBULATORY_CARE_PROVIDER_SITE_OTHER): Payer: Self-pay | Admitting: Adult Health

## 2022-08-20 DIAGNOSIS — J329 Chronic sinusitis, unspecified: Secondary | ICD-10-CM | POA: Diagnosis not present

## 2022-08-20 DIAGNOSIS — J310 Chronic rhinitis: Secondary | ICD-10-CM | POA: Diagnosis not present

## 2022-08-30 ENCOUNTER — Inpatient Hospital Stay: Payer: Medicare PPO | Attending: Hematology and Oncology

## 2022-08-30 DIAGNOSIS — Z79899 Other long term (current) drug therapy: Secondary | ICD-10-CM | POA: Diagnosis not present

## 2022-08-30 DIAGNOSIS — C9001 Multiple myeloma in remission: Secondary | ICD-10-CM | POA: Insufficient documentation

## 2022-08-30 LAB — CBC WITH DIFFERENTIAL/PLATELET
Abs Immature Granulocytes: 0.02 10*3/uL (ref 0.00–0.07)
Basophils Absolute: 0.1 10*3/uL (ref 0.0–0.1)
Basophils Relative: 1 %
Eosinophils Absolute: 0.1 10*3/uL (ref 0.0–0.5)
Eosinophils Relative: 2 %
HCT: 40.7 % (ref 36.0–46.0)
Hemoglobin: 13.3 g/dL (ref 12.0–15.0)
Immature Granulocytes: 0 %
Lymphocytes Relative: 33 %
Lymphs Abs: 1.8 10*3/uL (ref 0.7–4.0)
MCH: 29.2 pg (ref 26.0–34.0)
MCHC: 32.7 g/dL (ref 30.0–36.0)
MCV: 89.3 fL (ref 80.0–100.0)
Monocytes Absolute: 0.4 10*3/uL (ref 0.1–1.0)
Monocytes Relative: 8 %
Neutro Abs: 3.1 10*3/uL (ref 1.7–7.7)
Neutrophils Relative %: 56 %
Platelets: 207 10*3/uL (ref 150–400)
RBC: 4.56 MIL/uL (ref 3.87–5.11)
RDW: 14.5 % (ref 11.5–15.5)
WBC: 5.5 10*3/uL (ref 4.0–10.5)
nRBC: 0 % (ref 0.0–0.2)

## 2022-08-30 LAB — COMPREHENSIVE METABOLIC PANEL
ALT: 13 U/L (ref 0–44)
AST: 23 U/L (ref 15–41)
Albumin: 4.1 g/dL (ref 3.5–5.0)
Alkaline Phosphatase: 72 U/L (ref 38–126)
Anion gap: 7 (ref 5–15)
BUN: 19 mg/dL (ref 8–23)
CO2: 28 mmol/L (ref 22–32)
Calcium: 9.8 mg/dL (ref 8.9–10.3)
Chloride: 102 mmol/L (ref 98–111)
Creatinine, Ser: 1.03 mg/dL — ABNORMAL HIGH (ref 0.44–1.00)
GFR, Estimated: 58 mL/min — ABNORMAL LOW (ref >60)
Glucose, Bld: 98 mg/dL (ref 70–99)
Potassium: 3.9 mmol/L (ref 3.5–5.1)
Sodium: 137 mmol/L (ref 135–145)
Total Bilirubin: 0.6 mg/dL (ref 0.3–1.2)
Total Protein: 7.5 g/dL (ref 6.5–8.1)

## 2022-08-31 LAB — KAPPA/LAMBDA LIGHT CHAINS
Kappa free light chain: 19.4 mg/L (ref 3.3–19.4)
Kappa, lambda light chain ratio: 0.61 (ref 0.26–1.65)
Lambda free light chains: 31.9 mg/L — ABNORMAL HIGH (ref 5.7–26.3)

## 2022-09-02 LAB — MULTIPLE MYELOMA PANEL, SERUM
Albumin SerPl Elph-Mcnc: 3.7 g/dL (ref 2.9–4.4)
Albumin/Glob SerPl: 1.2 (ref 0.7–1.7)
Alpha 1: 0.2 g/dL (ref 0.0–0.4)
Alpha2 Glob SerPl Elph-Mcnc: 0.9 g/dL (ref 0.4–1.0)
B-Globulin SerPl Elph-Mcnc: 0.9 g/dL (ref 0.7–1.3)
Gamma Glob SerPl Elph-Mcnc: 1 g/dL (ref 0.4–1.8)
Globulin, Total: 3.1 g/dL (ref 2.2–3.9)
IgA: 134 mg/dL (ref 87–352)
IgG (Immunoglobin G), Serum: 980 mg/dL (ref 586–1602)
IgM (Immunoglobulin M), Srm: 56 mg/dL (ref 26–217)
Total Protein ELP: 6.8 g/dL (ref 6.0–8.5)

## 2022-09-09 ENCOUNTER — Encounter: Payer: Self-pay | Admitting: Hematology and Oncology

## 2022-09-09 ENCOUNTER — Telehealth: Payer: Self-pay | Admitting: Hematology and Oncology

## 2022-09-09 ENCOUNTER — Inpatient Hospital Stay: Payer: Medicare PPO | Admitting: Hematology and Oncology

## 2022-09-09 DIAGNOSIS — C9001 Multiple myeloma in remission: Secondary | ICD-10-CM

## 2022-09-09 DIAGNOSIS — J3089 Other allergic rhinitis: Secondary | ICD-10-CM | POA: Diagnosis not present

## 2022-09-09 NOTE — Telephone Encounter (Signed)
Spoke with patient confirming upcoming appointments  

## 2022-09-09 NOTE — Progress Notes (Signed)
HEMATOLOGY-ONCOLOGY ELECTRONIC VISIT PROGRESS NOTE  Patient Care Team: Renaye Rakers, MD as PCP - General (Family Medicine) Runell Gess, MD as PCP - Cardiology (Cardiology) Flo Shanks, MD as Consulting Physician (Otolaryngology) Artis Delay, MD as Consulting Physician (Hematology and Oncology)  I connected with the patient via telephone conference and verified that I am speaking with the correct person using two identifiers. The patient's location is at home and I am providing care from the [facility] I discussed the limitations, risks, security and privacy concerns of performing an evaluation and management service by e-visits and the availability of in person appointments.  I also discussed with the patient that there may be a patient responsible charge related to this service. The patient expressed understanding and agreed to proceed.   ASSESSMENT & PLAN:  Multiple myeloma in remission [facility]) I have reviewed results of her recent myeloma panel Her M spike was detected back in September 2023 but recent repeat blood work came back undetectable again Recent elevated lambda light chain is not related to her myeloma and likely related to her recent infection Overall, she is doing remission I plan to repeat labs and follow-up again in 3 months  Allergic rhinitis She has frequent sinus infection and was recently evaluated by ENT extensively She is now improving after a course of antibiotics I will defer to her ENT specialist for management Since the patient is in remission and is on observation, her recurrent infection is not related  No orders of the defined types were placed in this encounter.   INTERVAL HISTORY: Please see below for problem oriented charting. The purpose of today's discussion is to review test results Last month, the patient have a tough time due to recurrent sinus infection, upper respiratory tract infection and cough Ultimately, she was evaluated  extensively by ENT and completed antibiotics She felt better now Denies bone pain  SUMMARY OF ONCOLOGIC HISTORY: Oncology History  Multiple myeloma in remission  03/22/2016 Bone Marrow Biopsy   Bone Marrow Biopsy: Plasma cells 17% by Aspirate and 30% by CD 138 stain. Plasma cell neoplasm, Kappa Restricted Normal cytogenetics, FISH positive for +11 and +14 and +7   04/13/2016 Imaging   Skeletal survey showed lucencies within the calvarium worrisome for myeloma. No definite abnormal lytic or blastic lesions are observed elsewhere.   04/15/2016 Cancer Staging   Staging form: Multiple Myeloma, AJCC 6th Edition - Clinical stage from 04/15/2016: Stage IIA - Signed by Artis Delay, MD on 07/31/2021 Staged by: Managing physician Diagnostic confirmation: Positive histology Specimen type: Biopsy / Limited Resection Histopathologic type: Neoplasm, malignant   04/19/2016 - 09/10/2016 Chemotherapy   The patient consented to treatment with Revlimid, dexamethasone and Velcade   09/14/2016 Bone Marrow Biopsy   In summary, there is a normal cellular marrow with trilineage hematopoiesis.  A mild plasmacytosis is present, but there is no definitive evidence of residual multiple myeloma   10/12/2016 - 10/12/2016 Chemotherapy   She received melphalan as conditioning chemo   10/13/2016 Bone Marrow Transplant   She received autologous stem cell transplant at [facility]   01/24/2017 Bone Marrow Biopsy   BONE MARROW: -          Normocellular marrow for age (50%) with trilineage hematopoiesis. -     No morphologic or immunohistochemical evidence of involvement by plasma cell neoplasm (see comment)  PERIPHERAL BLOOD: -          Unremarkable (see CBC data)  COMMENT: The bone marrow is normocellular for  age and shows adequate trilineage hematopoiesis without significant (<10%) dysplastic changes present. Blasts are not increased. Plasma cells represent about 2% of total cells on aspirate smears. Appropriately  controlled immunohistochemical stains are performed on the core biopsy. CD138 highlights small interstitial plasma cells comprising approximately 2% of total cells. These plasma cells appear polytypic as demonstrated by kappa and lambda in-situ hybridization.   01/25/2017 PET scan   No FDG avid osseous lesions or masses are identified.   02/18/2017 - 04/01/2017 Chemotherapy   She received weekly Velcade, Pomalyst and Dexamethasone   04/15/2017 - 08/28/2021 Chemotherapy   The patient had Pomalyst only   04/20/2017 Bone Marrow Biopsy   Bone Marrow (BM) and Peripheral Blood (PB) FINAL PATHOLOGIC DIAGNOSIS BONE MARROW: Normocellular bone marrow (40%) with normal numbers of megakaryocytes, erythroid hyperplasia and no increase in plasma cells (1%).   10/20/2021 Imaging   Bone density is normal     REVIEW OF SYSTEMS:   Constitutional: Denies fevers, chills or abnormal weight loss Eyes: Denies blurriness of vision Ears, nose, mouth, throat, and face: Denies mucositis or sore throat Respiratory: Denies cough, dyspnea or wheezes Cardiovascular: Denies palpitation, chest discomfort Gastrointestinal:  Denies nausea, heartburn or change in bowel habits Skin: Denies abnormal skin rashes Lymphatics: Denies new lymphadenopathy or easy bruising Neurological:Denies numbness, tingling or new weaknesses Behavioral/Psych: Mood is stable, no new changes  Extremities: No lower extremity edema All other systems were reviewed with the patient and are negative.  I have reviewed the past medical history, past surgical history, social history and family history with the patient and they are unchanged from previous note.  ALLERGIES:  is allergic to revlimid [lenalidomide].  MEDICATIONS:  Current Outpatient Medications  Medication Sig Dispense Refill   acetaminophen (TYLENOL) 500 MG tablet Take 500 mg by mouth every 6 (six) hours as needed.     albuterol (VENTOLIN HFA) 108 (90 Base) MCG/ACT inhaler Inhale 2  puffs into the lungs every 4 (four) hours as needed for wheezing or shortness of breath. 1 each 0   ASPIRIN 81 PO Take 81 mg by mouth daily.     benzonatate (TESSALON) 100 MG capsule Take 1 capsule (100 mg total) by mouth 3 (three) times daily as needed for cough. 21 capsule 0   Chlorphen-PE-Acetaminophen (NOREL AD) 4-10-325 MG TABS Take by mouth.     docusate sodium (COLACE) 100 MG capsule Take 200 mg by mouth daily.     fluticasone (FLONASE) 50 MCG/ACT nasal spray Place into both nostrils daily as needed for allergies or rhinitis.     losartan-hydrochlorothiazide (HYZAAR) 100-25 MG tablet Take 1 tablet by mouth daily. (Patient taking differently: Take 0.5 tablets by mouth daily. TAKES 1/2 TABLET DAILY) 90 tablet 3   montelukast (SINGULAIR) 10 MG tablet      Multiple Vitamin (MULTIVITAMIN) tablet Take 1 tablet by mouth daily.     Polyethylene Glycol 3350 (MIRALAX PO) Take by mouth as needed.     pseudoephedrine (SUDAFED) 120 MG 12 hr tablet Take 120 mg by mouth 2 (two) times daily.     rosuvastatin (CRESTOR) 10 MG tablet Take 10 mg by mouth as directed. One tablet before bedtime.     No current facility-administered medications for this visit.    PHYSICAL EXAMINATION: ECOG PERFORMANCE STATUS: 1 - Symptomatic but completely ambulatory  LABORATORY DATA:  I have reviewed the data as listed    Latest Ref Rng & Units 08/30/2022    2:10 PM 05/31/2022   12:58 PM 02/18/2022  8:11 AM  CMP  Glucose 70 - 99 mg/dL 98  73  98   BUN 8 - 23 mg/dL Creatinine 0.44 - 1.00 mg/dL 1.61  0.96  0.45   Sodium 135 - 145 mmol/L 137  136  138   Potassium 3.5 - 5.1 mmol/L 3.9  3.9  4.1   Chloride 98 - 111 mmol/L 102  100  103   CO2 22 - 32 mmol/L 28  32  31   Calcium 8.9 - 10.3 mg/dL 9.8  9.9  9.2   Total Protein 6.5 - 8.1 g/dL 7.5  7.1  7.1   Total Bilirubin 0.3 - 1.2 mg/dL 0.6  0.7  0.8   Alkaline Phos 38 - 126 U/L 72  88  66   AST 15 - 41 U/L ALT 0 - 44 U/L Lab Results  Component Value Date   WBC 5.5 08/30/2022   HGB 13.3 08/30/2022   HCT 40.7 08/30/2022   MCV 89.3 08/30/2022   PLT 207 08/30/2022   NEUTROABS 3.1 08/30/2022     I discussed the assessment and treatment plan with the patient. The patient was provided an opportunity to ask questions and all were answered. The patient agreed with the plan and demonstrated an understanding of the instructions. The patient was advised to call back or seek an in-person evaluation if the symptoms worsen or if the condition fails to improve as anticipated.    I spent 20 minutes for the appointment reviewing test results, discuss management and coordination of care.  Artis Delay, MD 09/09/2022 12:22 PM

## 2022-09-09 NOTE — Assessment & Plan Note (Signed)
I have reviewed results of her recent myeloma panel Her M spike was detected back in September 2023 but recent repeat blood work came back undetectable again Recent elevated lambda light chain is not related to her myeloma and likely related to her recent infection Overall, she is doing remission I plan to repeat labs and follow-up again in 3 months

## 2022-09-09 NOTE — Assessment & Plan Note (Signed)
She has frequent sinus infection and was recently evaluated by ENT extensively She is now improving after a course of antibiotics I will defer to her ENT specialist for management Since the patient is in remission and is on observation, her recurrent infection is not related

## 2022-09-14 ENCOUNTER — Encounter (INDEPENDENT_AMBULATORY_CARE_PROVIDER_SITE_OTHER): Payer: Self-pay | Admitting: Adult Health

## 2022-09-14 ENCOUNTER — Ambulatory Visit (INDEPENDENT_AMBULATORY_CARE_PROVIDER_SITE_OTHER): Payer: Medicare PPO | Admitting: Adult Health

## 2022-09-14 VITALS — BP 100/68 | HR 60 | Temp 97.7°F | Ht 68.0 in | Wt 267.0 lb

## 2022-09-14 DIAGNOSIS — I10 Essential (primary) hypertension: Secondary | ICD-10-CM | POA: Diagnosis not present

## 2022-09-14 DIAGNOSIS — E669 Obesity, unspecified: Secondary | ICD-10-CM

## 2022-09-14 DIAGNOSIS — R059 Cough, unspecified: Secondary | ICD-10-CM | POA: Diagnosis not present

## 2022-09-14 DIAGNOSIS — Z6841 Body Mass Index (BMI) 40.0 and over, adult: Secondary | ICD-10-CM | POA: Diagnosis not present

## 2022-09-14 NOTE — Progress Notes (Signed)
WEIGHT SUMMARY AND BIOMETRICS  Vitals Temp: 97.7 F (36.5 C) BP: 100/68 Pulse Rate: 60 SpO2: 100 %   Anthropometric Measurements Height: 5\' 8"  (1.727 m) Weight: 267 lb (121.1 kg) BMI (Calculated): 40.61 Weight at Last Visit: 268lb Weight Lost Since Last Visit: 1lb Weight Gained Since Last Visit: 0 Starting Weight: 268lb Total Weight Loss (lbs): 14 lb (6.35 kg)   Body Composition  Body Fat %: 49.9 % Fat Mass (lbs): 133.6 lbs Muscle Mass (lbs): 127.4 lbs Total Body Water (lbs): 88.8 lbs Visceral Fat Rating : 17   Other Clinical Data Fasting: no Labs: no Today's Visit #: 8 Starting Date: 01/07/22    Chief Complaint:   OBESITY Carolyn Newman is here to discuss her progress with her obesity treatment plan. She is on the the Category 2 Plan and states she is following her eating plan approximately 70 % of the time. She states she is exercising Stretch Zone 30 minutes 3-4 times per month.   Interim History:  Since last OV at [facility] on 07/08/2022: She has seen her ENT provider and her Oncologist. She reports completing last round of ABX for sinusitis and her energy levels are slowly returning.  Multiple Myeloma Panel stable and normalized- she will f/u with Oncology in 3 months   Subjective:   1. Cough in adult She reports persistent, non-productive cough recently with high suspicion of GERD. She eliminated chocolate, citrus juice, sweat tea, and late night eating- cough has all but resolved.  2. Essential hypertension BP soft, but stable. She denies sx's of hypotension. Her PCP/Dr. Parke Simmers provides Losartan/HCTZ 100/25mg  1/2 tab QD  Assessment/Plan:   1. Cough in adult Continue to avoid GERD triggers.  2. Essential hypertension Continue healthy eating and increasing activity levels. Continue Losartan/HCTZ 100/25mg  1/2 tab QD  3. Obesity, current BMI 40.7  Carolyn Newman is currently in the action stage of change. As such, her goal is to continue with weight loss  efforts. She has agreed to the Category 2 Plan.   Exercise goals: Older adults should follow the adult guidelines. When older adults cannot meet the adult guidelines, they should be as physically active as their abilities and conditions will allow.  Older adults should determine their level of effort for physical activity relative to their level of fitness.   Behavioral modification strategies: increasing lean protein intake, decreasing simple carbohydrates, increasing vegetables, increasing water intake, no skipping meals, meal planning and cooking strategies, better snacking choices, and planning for success.  Carolyn Newman has agreed to follow-up with our clinic in 4 weeks. She was informed of the importance of frequent follow-up visits to maximize her success with intensive lifestyle modifications for her multiple health conditions.   Check IC at next OC- pt aware to arrive fasting and 30 mins early  Objective:   Blood pressure 100/68, pulse 60, temperature 97.7 F (36.5 C), height 5\' 8"  (1.727 m), weight 267 lb (121.1 kg), SpO2 100 %. Body mass index is 40.6 kg/m.  General: Cooperative, alert, well developed, in no acute distress. HEENT: Conjunctivae and lids unremarkable. Cardiovascular: Regular rhythm.  Lungs: Normal work of breathing. Neurologic: No focal deficits.   Lab Results  Component Value Date   CREATININE 1.03 (H) 08/30/2022   BUN 19 08/30/2022   NA 137 08/30/2022   K 3.9 08/30/2022   CL 102 08/30/2022   CO2 28 08/30/2022   Lab Results  Component Value Date   ALT 13 08/30/2022   AST 23 08/30/2022   ALKPHOS 72  08/30/2022   BILITOT 0.6 08/30/2022   Lab Results  Component Value Date   HGBA1C 5.9 (H) 01/07/2022   HGBA1C 5.6 11/27/2019   Lab Results  Component Value Date   INSULIN 6.7 01/07/2022   INSULIN 9.2 11/27/2019   Lab Results  Component Value Date   TSH 1.480 01/07/2022   Lab Results  Component Value Date   CHOL 159 01/07/2022   HDL 72 01/07/2022    LDLCALC 75 01/07/2022   TRIG 59 01/07/2022   CHOLHDL 2.2 01/07/2022   Lab Results  Component Value Date   VD25OH 75.5 01/07/2022   VD25OH 89.78 07/18/2020   VD25OH 68.6 11/27/2019   Lab Results  Component Value Date   WBC 5.5 08/30/2022   HGB 13.3 08/30/2022   HCT 40.7 08/30/2022   MCV 89.3 08/30/2022   PLT 207 08/30/2022   Lab Results  Component Value Date   IRON 108 11/27/2019   TIBC 250 11/27/2019   FERRITIN 99 11/27/2019    Attestation Statements:   Reviewed by clinician on day of visit: allergies, medications, problem list, medical history, surgical history, family history, social history, and previous encounter notes.  Time spent on visit including pre-visit chart review and post-visit care and charting was 28 minutes.   I have reviewed the above documentation for accuracy and completeness, and I agree with the above. -  Nohelani Benning d. Yehoshua Vitelli, NP-C

## 2022-09-15 ENCOUNTER — Ambulatory Visit (INDEPENDENT_AMBULATORY_CARE_PROVIDER_SITE_OTHER): Payer: Medicare PPO | Admitting: Adult Health

## 2022-10-06 ENCOUNTER — Encounter: Payer: Self-pay | Admitting: Cardiovascular Disease

## 2022-10-14 ENCOUNTER — Encounter (INDEPENDENT_AMBULATORY_CARE_PROVIDER_SITE_OTHER): Payer: Self-pay | Admitting: Adult Health

## 2022-10-14 ENCOUNTER — Ambulatory Visit (INDEPENDENT_AMBULATORY_CARE_PROVIDER_SITE_OTHER): Payer: Medicare PPO | Admitting: Adult Health

## 2022-10-14 VITALS — BP 143/77 | HR 51 | Temp 97.8°F | Ht 68.0 in | Wt 272.0 lb

## 2022-10-14 DIAGNOSIS — Z6841 Body Mass Index (BMI) 40.0 and over, adult: Secondary | ICD-10-CM

## 2022-10-14 DIAGNOSIS — R0602 Shortness of breath: Secondary | ICD-10-CM

## 2022-10-14 DIAGNOSIS — E669 Obesity, unspecified: Secondary | ICD-10-CM

## 2022-10-14 DIAGNOSIS — N1831 Chronic kidney disease, stage 3a: Secondary | ICD-10-CM

## 2022-10-14 DIAGNOSIS — E559 Vitamin D deficiency, unspecified: Secondary | ICD-10-CM

## 2022-10-14 NOTE — Progress Notes (Signed)
WEIGHT SUMMARY AND BIOMETRICS  Vitals Temp: 97.8 F (36.6 C) BP: (!) 143/77 Pulse Rate: (!) 51 SpO2: 100 %   Anthropometric Measurements Height: 5\' 8"  (1.727 m) Weight: 272 lb (123.4 kg) BMI (Calculated): 41.37 Weight at Last Visit: 267lb Weight Lost Since Last Visit: 0 Weight Gained Since Last Visit: 5lb Starting Weight: 268lb Total Weight Loss (lbs): 9 lb (4.082 kg)   Body Composition  Body Fat %: 51 % Fat Mass (lbs): 138.8 lbs Muscle Mass (lbs): 126.6 lbs Total Body Water (lbs): 92 lbs Visceral Fat Rating : 18   Other Clinical Data RMR: 1555 Fasting: yes Labs: yes Today's Visit #: 9 Starting Date: 01/07/22    Chief Complaint:   OBESITY Carolyn Newman is here to discuss her progress with her obesity treatment plan. She is on the the Category 2 Plan and states she is following her eating plan approximately 40 % of the time. She states she is exercising Walking 20 minutes 3 times per week.   Interim History:  She recently travelled [place] for a week with a friend! They stayed at [facility]. Attended [employer], [employer] concert, and ate at [employer].  She won $600 at the slots!  Hunger/appetite-reports stable appetite.  Exercise-walking 20 mins at least 3 x week  Subjective:   1. SOB (shortness of breath) on exertion She endorses dyspnea with extreme exertion, denies CP  01/07/22 09:00  RMR 1512  Slower than expected   10/14/22  RMR 1555  Increased slightly, and still slower than expected.  2. Chronic kidney disease, stage 3a (HCC)  Latest Reference Range & Units 01/15/22 08:37 02/18/22 08:11 05/31/22 12:58 08/30/22 14:10  Creatinine 0.44 - 1.00 mg/dL 9.14 (H) 7.82 (H) 9.56 1.03 (H)  (H): Data is abnormally high  Latest Reference Range & Units 01/15/22 08:37 02/18/22 08:11 05/31/22 12:58 08/30/22 14:10  GFR, Estimated >60 mL/min 57 (L) 59 (L) >60 58 (L)  (L): Data is abnormally low She is on Losartan/HCTZ 100/25mg   QD- managed by PCP/Dr. Parke Simmers  3. Vitamin D deficiency  Latest Reference Range & Units 01/07/22 10:59  Vitamin D, 25-Hydroxy 30.0 - 100.0 ng/mL 75.5  She is on daily OTC Multivitamin   Assessment/Plan:   1. SOB (shortness of breath) on exertion Continue Cat 2 Meal Plan  2. Chronic kidney disease, stage 3a (HCC) Remain well hydrated Continue Losartan/HCTZ 100/25mg  QD Avoid Nephrotoxic substances  3. Vitamin D deficiency Continue daily OTC Multivitamin   4. Obesity, current BMI 41.37  [name] is currently in the action stage of change. As such, her goal is to continue with weight loss efforts. She has agreed to the Category 2 Plan.   Exercise goals: Older adults should follow the adult guidelines. When older adults cannot meet the adult guidelines, they should be as physically active as their abilities and conditions will allow.  Older adults should do exercises that maintain or improve balance if they are at risk of falling.  Older adults should determine their level of effort for physical activity relative to their level of fitness.   Behavioral modification strategies: increasing lean protein intake, decreasing simple carbohydrates, increasing vegetables, increasing water intake, no skipping meals, meal planning and cooking strategies, and planning for success.  [name] has agreed to follow-up with our clinic in 4 weeks. She was informed of the importance of frequent follow-up visits to maximize her success with intensive lifestyle modifications for her multiple health conditions.   Objective:   Blood pressure Marland Kitchen)  143/77, pulse (!) 51, temperature 97.8 F (36.6 C), height 5\' 8"  (1.727 m), weight 272 lb (123.4 kg), SpO2 100 %. Body mass index is 41.36 kg/m.  General: Cooperative, alert, well developed, in no acute distress. HEENT: Conjunctivae and lids unremarkable. Cardiovascular: Regular rhythm.  Lungs: Normal work of breathing. Neurologic: No focal deficits.   Lab Results   Component Value Date   CREATININE 1.03 (H) 08/30/2022   BUN 19 08/30/2022   NA 137 08/30/2022   K 3.9 08/30/2022   CL 102 08/30/2022   CO2 28 08/30/2022   Lab Results  Component Value Date   ALT 13 08/30/2022   AST 23 08/30/2022   ALKPHOS 72 08/30/2022   BILITOT 0.6 08/30/2022   Lab Results  Component Value Date   HGBA1C 5.9 (H) 01/07/2022   HGBA1C 5.6 11/27/2019   Lab Results  Component Value Date   INSULIN 6.7 01/07/2022   INSULIN 9.2 11/27/2019   Lab Results  Component Value Date   TSH 1.480 01/07/2022   Lab Results  Component Value Date   CHOL 159 01/07/2022   HDL 72 01/07/2022   LDLCALC 75 01/07/2022   TRIG 59 01/07/2022   CHOLHDL 2.2 01/07/2022   Lab Results  Component Value Date   VD25OH 75.5 01/07/2022   VD25OH 89.78 07/18/2020   VD25OH 68.6 11/27/2019   Lab Results  Component Value Date   WBC 5.5 08/30/2022   HGB 13.3 08/30/2022   HCT 40.7 08/30/2022   MCV 89.3 08/30/2022   PLT 207 08/30/2022   Lab Results  Component Value Date   IRON 108 11/27/2019   TIBC 250 11/27/2019   FERRITIN 99 11/27/2019    Attestation Statements:   Reviewed by clinician on day of visit: allergies, medications, problem list, medical history, surgical history, family history, social history, and previous encounter notes.  I have reviewed the above documentation for accuracy and completeness, and I agree with the above. -  Dalphine Cowie d. Antoine Fiallos, NP-C

## 2022-10-29 DIAGNOSIS — C9001 Multiple myeloma in remission: Secondary | ICD-10-CM | POA: Diagnosis not present

## 2022-10-29 DIAGNOSIS — Z9484 Stem cells transplant status: Secondary | ICD-10-CM | POA: Diagnosis not present

## 2022-11-06 DIAGNOSIS — J069 Acute upper respiratory infection, unspecified: Secondary | ICD-10-CM | POA: Diagnosis not present

## 2022-11-06 DIAGNOSIS — I10 Essential (primary) hypertension: Secondary | ICD-10-CM | POA: Diagnosis not present

## 2022-11-06 DIAGNOSIS — Z20822 Contact with and (suspected) exposure to covid-19: Secondary | ICD-10-CM | POA: Diagnosis not present

## 2022-11-12 DIAGNOSIS — I129 Hypertensive chronic kidney disease with stage 1 through stage 4 chronic kidney disease, or unspecified chronic kidney disease: Secondary | ICD-10-CM | POA: Diagnosis not present

## 2022-11-12 DIAGNOSIS — K5900 Constipation, unspecified: Secondary | ICD-10-CM | POA: Diagnosis not present

## 2022-11-12 DIAGNOSIS — J4520 Mild intermittent asthma, uncomplicated: Secondary | ICD-10-CM | POA: Diagnosis not present

## 2022-11-12 DIAGNOSIS — E1122 Type 2 diabetes mellitus with diabetic chronic kidney disease: Secondary | ICD-10-CM | POA: Diagnosis not present

## 2022-11-12 DIAGNOSIS — E8881 Metabolic syndrome: Secondary | ICD-10-CM | POA: Diagnosis not present

## 2022-11-12 DIAGNOSIS — I719 Aortic aneurysm of unspecified site, without rupture: Secondary | ICD-10-CM | POA: Diagnosis not present

## 2022-11-12 DIAGNOSIS — E785 Hyperlipidemia, unspecified: Secondary | ICD-10-CM | POA: Diagnosis not present

## 2022-11-12 DIAGNOSIS — M1990 Unspecified osteoarthritis, unspecified site: Secondary | ICD-10-CM | POA: Diagnosis not present

## 2022-11-22 ENCOUNTER — Ambulatory Visit (INDEPENDENT_AMBULATORY_CARE_PROVIDER_SITE_OTHER): Payer: Medicare PPO | Admitting: Adult Health

## 2022-11-22 ENCOUNTER — Encounter (INDEPENDENT_AMBULATORY_CARE_PROVIDER_SITE_OTHER): Payer: Self-pay | Admitting: Adult Health

## 2022-11-22 VITALS — BP 125/85 | HR 68 | Temp 98.0°F | Ht 68.0 in | Wt 274.0 lb

## 2022-11-22 DIAGNOSIS — C9001 Multiple myeloma in remission: Secondary | ICD-10-CM | POA: Diagnosis not present

## 2022-11-22 DIAGNOSIS — R0981 Nasal congestion: Secondary | ICD-10-CM | POA: Diagnosis not present

## 2022-11-22 DIAGNOSIS — R490 Dysphonia: Secondary | ICD-10-CM | POA: Diagnosis not present

## 2022-11-22 DIAGNOSIS — E559 Vitamin D deficiency, unspecified: Secondary | ICD-10-CM

## 2022-11-22 DIAGNOSIS — E669 Obesity, unspecified: Secondary | ICD-10-CM

## 2022-11-22 DIAGNOSIS — I10 Essential (primary) hypertension: Secondary | ICD-10-CM | POA: Diagnosis not present

## 2022-11-22 DIAGNOSIS — R7303 Prediabetes: Secondary | ICD-10-CM

## 2022-11-22 DIAGNOSIS — Z6841 Body Mass Index (BMI) 40.0 and over, adult: Secondary | ICD-10-CM | POA: Diagnosis not present

## 2022-11-22 DIAGNOSIS — E785 Hyperlipidemia, unspecified: Secondary | ICD-10-CM | POA: Diagnosis not present

## 2022-11-22 DIAGNOSIS — J301 Allergic rhinitis due to pollen: Secondary | ICD-10-CM | POA: Diagnosis not present

## 2022-11-22 DIAGNOSIS — E782 Mixed hyperlipidemia: Secondary | ICD-10-CM | POA: Diagnosis not present

## 2022-11-22 NOTE — Progress Notes (Signed)
WEIGHT SUMMARY AND BIOMETRICS  Vitals Temp: 98 F (36.7 C) BP: 125/85 Pulse Rate: 68 SpO2: 100 %   Anthropometric Measurements Height: 5\' 8"  (1.727 m) Weight: 274 lb (124.3 kg) BMI (Calculated): 41.67 Weight at Last Visit: 272lb Weight Lost Since Last Visit: 0 Weight Gained Since Last Visit: 2lb Starting Weight: 268lb Total Weight Loss (lbs): 7 lb (3.175 kg)   Body Composition  Body Fat %: 50.7 % Fat Mass (lbs): 139 lbs Muscle Mass (lbs): 128.2 lbs Total Body Water (lbs): 91.4 lbs Visceral Fat Rating : 18   Other Clinical Data Fasting: No Labs: No Today's Visit #: 10 Starting Date: 01/07/22    Chief Complaint:   OBESITY Carolyn Newman is here to discuss her progress with her obesity treatment plan. She is on the the Category 2 Plan and states she is following her eating plan approximately 40 % of the time. She states she is exercising Walking/Biking/Strength Training 45-60 minutes 3 times per week.   Interim History:  Carolyn Newman reports frequent travel and hotel stays the last several weeks.  Hunger/appetite-when she works late, she will often experience late night polyphagia.  Sleep- she endorses sound, quality sleep most nights.  Exercise-when able she will walk, bike, or strength train.  Subjective:   1. Hoarseness of voice Carolyn Newman has experienced recurrent pansinusitis Fall 2023 She reports another episode of "raspy" voice the last 72 hours. She reports remote hx of GERD. She denies tobacco/vape use. She is not currently on PPI or H2 blocker  2. Vitamin D deficiency  Latest Reference Range & Units 01/07/22 10:59  Vitamin D, 25-Hydroxy 30.0 - 100.0 ng/mL 75.5  Level at goal- she is on daily OTC Multivitamin   3. Prediabetes Lab Results  Component Value Date   HGBA1C 5.9 (H) 01/07/2022   HGBA1C 5.6 11/27/2019    She endorses late evening polyphagia when working into evening  Assessment/Plan:   1. Hoarseness of voice Avoid known GERD  triggers, f/u with Gastroenterologist  2. Vitamin D deficiency Continue daily OTC Multivitamin  3. Prediabetes Provided 100/200 snacl sheets  4. Obesity, current BMI 41.67  Carolyn Newman is currently in the action stage of change. As such, her goal is to continue with weight loss efforts. She has agreed to the Category 2 Plan.   Exercise goals: Older adults should follow the adult guidelines. When older adults cannot meet the adult guidelines, they should be as physically active as their abilities and conditions will allow.  Older adults should do exercises that maintain or improve balance if they are at risk of falling.  Older adults should determine their level of effort for physical activity relative to their level of fitness.   Behavioral modification strategies: increasing lean protein intake, decreasing simple carbohydrates, increasing vegetables, increasing water intake, no skipping meals, meal planning and cooking strategies, keeping healthy foods in the home, better snacking choices, emotional eating strategies, and planning for success.  Carolyn Newman has agreed to follow-up with our clinic in 4 weeks. She was informed of the importance of frequent follow-up visits to maximize her success with intensive lifestyle modifications for her multiple health conditions.   Objective:   Blood pressure 125/85, pulse 68, temperature 98 F (36.7 C), height 5\' 8"  (1.727 m), weight 274 lb (124.3 kg), SpO2 100 %. Body mass index is 41.66 kg/m.  General: Cooperative, alert, well developed, in no acute distress. HEENT: Conjunctivae and lids unremarkable. Cardiovascular: Regular rhythm.  Lungs: Normal work of breathing. Neurologic: No focal  deficits.   Lab Results  Component Value Date   CREATININE 1.03 (H) 08/30/2022   BUN 19 08/30/2022   NA 137 08/30/2022   K 3.9 08/30/2022   CL 102 08/30/2022   CO2 28 08/30/2022   Lab Results  Component Value Date   ALT 13 08/30/2022   AST 23 08/30/2022    ALKPHOS 72 08/30/2022   BILITOT 0.6 08/30/2022   Lab Results  Component Value Date   HGBA1C 5.9 (H) 01/07/2022   HGBA1C 5.6 11/27/2019   Lab Results  Component Value Date   INSULIN 6.7 01/07/2022   INSULIN 9.2 11/27/2019   Lab Results  Component Value Date   TSH 1.480 01/07/2022   Lab Results  Component Value Date   CHOL 159 01/07/2022   HDL 72 01/07/2022   LDLCALC 75 01/07/2022   TRIG 59 01/07/2022   CHOLHDL 2.2 01/07/2022   Lab Results  Component Value Date   VD25OH 75.5 01/07/2022   VD25OH 89.78 07/18/2020   VD25OH 68.6 11/27/2019   Lab Results  Component Value Date   WBC 5.5 08/30/2022   HGB 13.3 08/30/2022   HCT 40.7 08/30/2022   MCV 89.3 08/30/2022   PLT 207 08/30/2022   Lab Results  Component Value Date   IRON 108 11/27/2019   TIBC 250 11/27/2019   FERRITIN 99 11/27/2019    Attestation Statements:   Reviewed by clinician on day of visit: allergies, medications, problem list, medical history, surgical history, family history, social history, and previous encounter notes.  Time spent on visit including pre-visit chart review and post-visit care and charting was 29 minutes.   I have reviewed the above documentation for accuracy and completeness, and I agree with the above. -  Farooq Petrovich d. Orvel Cutsforth, NP-C

## 2022-11-26 ENCOUNTER — Other Ambulatory Visit: Payer: Self-pay

## 2022-11-26 ENCOUNTER — Inpatient Hospital Stay: Payer: Medicare PPO | Attending: Hematology and Oncology

## 2022-11-26 DIAGNOSIS — C9001 Multiple myeloma in remission: Secondary | ICD-10-CM | POA: Insufficient documentation

## 2022-11-26 LAB — CBC WITH DIFFERENTIAL/PLATELET
Abs Immature Granulocytes: 0.01 10*3/uL (ref 0.00–0.07)
Basophils Absolute: 0 10*3/uL (ref 0.0–0.1)
Basophils Relative: 1 %
Eosinophils Absolute: 0.1 10*3/uL (ref 0.0–0.5)
Eosinophils Relative: 2 %
HCT: 41 % (ref 36.0–46.0)
Hemoglobin: 13.2 g/dL (ref 12.0–15.0)
Immature Granulocytes: 0 %
Lymphocytes Relative: 35 %
Lymphs Abs: 1.5 10*3/uL (ref 0.7–4.0)
MCH: 28.9 pg (ref 26.0–34.0)
MCHC: 32.2 g/dL (ref 30.0–36.0)
MCV: 89.7 fL (ref 80.0–100.0)
Monocytes Absolute: 0.4 10*3/uL (ref 0.1–1.0)
Monocytes Relative: 10 %
Neutro Abs: 2.2 10*3/uL (ref 1.7–7.7)
Neutrophils Relative %: 52 %
Platelets: 173 10*3/uL (ref 150–400)
RBC: 4.57 MIL/uL (ref 3.87–5.11)
RDW: 14.3 % (ref 11.5–15.5)
WBC: 4.2 10*3/uL (ref 4.0–10.5)
nRBC: 0 % (ref 0.0–0.2)

## 2022-11-26 LAB — COMPREHENSIVE METABOLIC PANEL
ALT: 19 U/L (ref 0–44)
AST: 30 U/L (ref 15–41)
Albumin: 4.1 g/dL (ref 3.5–5.0)
Alkaline Phosphatase: 76 U/L (ref 38–126)
Anion gap: 6 (ref 5–15)
BUN: 26 mg/dL — ABNORMAL HIGH (ref 8–23)
CO2: 31 mmol/L (ref 22–32)
Calcium: 9.7 mg/dL (ref 8.9–10.3)
Chloride: 101 mmol/L (ref 98–111)
Creatinine, Ser: 1.05 mg/dL — ABNORMAL HIGH (ref 0.44–1.00)
GFR, Estimated: 57 mL/min — ABNORMAL LOW (ref >60)
Glucose, Bld: 86 mg/dL (ref 70–99)
Potassium: 4.1 mmol/L (ref 3.5–5.1)
Sodium: 138 mmol/L (ref 135–145)
Total Bilirubin: 0.9 mg/dL (ref 0.3–1.2)
Total Protein: 7.2 g/dL (ref 6.5–8.1)

## 2022-11-29 LAB — KAPPA/LAMBDA LIGHT CHAINS
Kappa free light chain: 17 mg/L (ref 3.3–19.4)
Kappa, lambda light chain ratio: 0.87 (ref 0.26–1.65)
Lambda free light chains: 19.5 mg/L (ref 5.7–26.3)

## 2022-12-01 LAB — MULTIPLE MYELOMA PANEL, SERUM
Albumin SerPl Elph-Mcnc: 3.3 g/dL (ref 2.9–4.4)
Albumin/Glob SerPl: 1.2 (ref 0.7–1.7)
Alpha 1: 0.2 g/dL (ref 0.0–0.4)
Alpha2 Glob SerPl Elph-Mcnc: 0.8 g/dL (ref 0.4–1.0)
B-Globulin SerPl Elph-Mcnc: 0.9 g/dL (ref 0.7–1.3)
Gamma Glob SerPl Elph-Mcnc: 0.9 g/dL (ref 0.4–1.8)
Globulin, Total: 2.9 g/dL (ref 2.2–3.9)
IgA: 129 mg/dL (ref 87–352)
IgG (Immunoglobin G), Serum: 995 mg/dL (ref 586–1602)
IgM (Immunoglobulin M), Srm: 46 mg/dL (ref 26–217)
Total Protein ELP: 6.2 g/dL (ref 6.0–8.5)

## 2022-12-07 ENCOUNTER — Encounter: Payer: Self-pay | Admitting: Hematology and Oncology

## 2022-12-07 ENCOUNTER — Telehealth: Payer: Self-pay | Admitting: Hematology and Oncology

## 2022-12-07 ENCOUNTER — Inpatient Hospital Stay: Payer: Medicare PPO | Admitting: Hematology and Oncology

## 2022-12-07 DIAGNOSIS — C9001 Multiple myeloma in remission: Secondary | ICD-10-CM

## 2022-12-07 DIAGNOSIS — J3089 Other allergic rhinitis: Secondary | ICD-10-CM | POA: Diagnosis not present

## 2022-12-07 NOTE — Telephone Encounter (Signed)
 Left patient a vm regarding upcoming appointment

## 2022-12-07 NOTE — Assessment & Plan Note (Signed)
I have reviewed results of her recent myeloma panel Her M spike was detected back in September 2023 but recent repeat blood work came back undetectable again Her last treatment was in April 2023 Overall, she is doing remission I plan to repeat labs and follow-up again in 3 months If she remain in remission after 2 years, I will space out her future appointment

## 2022-12-07 NOTE — Assessment & Plan Note (Signed)
She has chronic allergic rhinitis and recurrent upper respiratory tract infection Recommend ENT referral and she agrees

## 2022-12-07 NOTE — Progress Notes (Signed)
HEMATOLOGY-ONCOLOGY ELECTRONIC VISIT PROGRESS NOTE  Patient Care Team: Renaye Rakers, MD as PCP - General (Family Medicine) Runell Gess, MD as PCP - Cardiology (Cardiology) Flo Shanks, MD as Consulting Physician (Otolaryngology) Artis Delay, MD as Consulting Physician (Hematology and Oncology)  I connected with the patient via telephone conference and verified that I am speaking with the correct person using two identifiers. The patient's location is at home and I am providing care from the [facility] I discussed the limitations, risks, security and privacy concerns of performing an evaluation and management service by e-visits and the availability of in person appointments.  I also discussed with the patient that there may be a patient responsible charge related to this service. The patient expressed understanding and agreed to proceed.   ASSESSMENT & PLAN:  Multiple myeloma in remission [facility]) I have reviewed results of her recent myeloma panel Her M spike was detected back in September 2023 but recent repeat blood work came back undetectable again Her last treatment was in April 2023 Overall, she is doing remission I plan to repeat labs and follow-up again in 3 months If she remain in remission after 2 years, I will space out her future appointment  Allergic rhinitis She has chronic allergic rhinitis and recurrent upper respiratory tract infection Recommend ENT referral and she agrees  No orders of the defined types were placed in this encounter.   INTERVAL HISTORY: Please see below for problem oriented charting. The purpose of today's discussion is to review test results The patient has recurrent upper respiratory tract infection again She have chronic sinus drainage She denies new bone pain We review all test results today  SUMMARY OF ONCOLOGIC HISTORY: Oncology History  Multiple myeloma in remission (HCC)  03/22/2016 Bone Marrow Biopsy   Bone Marrow Biopsy:  Plasma cells 17% by Aspirate and 30% by CD 138 stain. Plasma cell neoplasm, Kappa Restricted Normal cytogenetics, FISH positive for +11 and +14 and +7   04/13/2016 Imaging   Skeletal survey showed lucencies within the calvarium worrisome for myeloma. No definite abnormal lytic or blastic lesions are observed elsewhere.   04/15/2016 Cancer Staging   Staging form: Multiple Myeloma, AJCC 6th Edition - Clinical stage from 04/15/2016: Stage IIA - Signed by Artis Delay, MD on 07/31/2021 Staged by: Managing physician Diagnostic confirmation: Positive histology Specimen type: Biopsy / Limited Resection Histopathologic type: Neoplasm, malignant   04/19/2016 - 09/10/2016 Chemotherapy   The patient consented to treatment with Revlimid, dexamethasone and Velcade   09/14/2016 Bone Marrow Biopsy   In summary, there is a normal cellular marrow with trilineage hematopoiesis.  A mild plasmacytosis is present, but there is no definitive evidence of residual multiple myeloma   10/12/2016 - 10/12/2016 Chemotherapy   She received melphalan as conditioning chemo   10/13/2016 Bone Marrow Transplant   She received autologous stem cell transplant at [facility]   01/24/2017 Bone Marrow Biopsy   BONE MARROW: -          Normocellular marrow for age (50%) with trilineage hematopoiesis. -     No morphologic or immunohistochemical evidence of involvement by plasma cell neoplasm (see comment)  PERIPHERAL BLOOD: -          Unremarkable (see CBC data)  COMMENT: The bone marrow is normocellular for age and shows adequate trilineage hematopoiesis without significant (<10%) dysplastic changes present. Blasts are not increased. Plasma cells represent about 2% of total cells on aspirate smears. Appropriately controlled immunohistochemical stains are performed on the  core biopsy. CD138 highlights small interstitial plasma cells comprising approximately 2% of total cells. These plasma cells appear polytypic as demonstrated by  kappa and lambda in-situ hybridization.   01/25/2017 PET scan   No FDG avid osseous lesions or masses are identified.   02/18/2017 - 04/01/2017 Chemotherapy   She received weekly Velcade, Pomalyst and Dexamethasone   04/15/2017 - 08/28/2021 Chemotherapy   The patient had Pomalyst only   04/20/2017 Bone Marrow Biopsy   Bone Marrow (BM) and Peripheral Blood (PB) FINAL PATHOLOGIC DIAGNOSIS BONE MARROW: Normocellular bone marrow (40%) with normal numbers of megakaryocytes, erythroid hyperplasia and no increase in plasma cells (1%).   10/20/2021 Imaging   Bone density is normal     REVIEW OF SYSTEMS:   Constitutional: Denies fevers, chills or abnormal weight loss Eyes: Denies blurriness of vision Ears, nose, mouth, throat, and face: Denies mucositis or sore throat Respiratory: Denies cough, dyspnea or wheezes Cardiovascular: Denies palpitation, chest discomfort Gastrointestinal:  Denies nausea, heartburn or change in bowel habits Skin: Denies abnormal skin rashes Lymphatics: Denies new lymphadenopathy or easy bruising Neurological:Denies numbness, tingling or new weaknesses Behavioral/Psych: Mood is stable, no new changes  Extremities: No lower extremity edema All other systems were reviewed with the patient and are negative.  I have reviewed the past medical history, past surgical history, social history and family history with the patient and they are unchanged from previous note.  ALLERGIES:  is allergic to revlimid [lenalidomide].  MEDICATIONS:  Current Outpatient Medications  Medication Sig Dispense Refill   acetaminophen (TYLENOL) 500 MG tablet Take 500 mg by mouth every 6 (six) hours as needed.     albuterol (VENTOLIN HFA) 108 (90 Base) MCG/ACT inhaler Inhale 2 puffs into the lungs every 4 (four) hours as needed for wheezing or shortness of breath. 1 each 0   ASPIRIN 81 PO Take 81 mg by mouth daily.     benzonatate (TESSALON) 100 MG capsule Take 1 capsule (100 mg total) by  mouth 3 (three) times daily as needed for cough. 21 capsule 0   Chlorphen-PE-Acetaminophen (NOREL AD) 4-10-325 MG TABS Take by mouth.     docusate sodium (COLACE) 100 MG capsule Take 200 mg by mouth daily.     fluticasone (FLONASE) 50 MCG/ACT nasal spray Place into both nostrils daily as needed for allergies or rhinitis.     losartan-hydrochlorothiazide (HYZAAR) 100-25 MG tablet Take 1 tablet by mouth daily. (Patient taking differently: Take 0.5 tablets by mouth daily. TAKES 1/2 TABLET DAILY) 90 tablet 3   montelukast (SINGULAIR) 10 MG tablet      Multiple Vitamin (MULTIVITAMIN) tablet Take 1 tablet by mouth daily.     Polyethylene Glycol 3350 (MIRALAX PO) Take by mouth as needed.     pseudoephedrine (SUDAFED) 120 MG 12 hr tablet Take 120 mg by mouth 2 (two) times daily.     rosuvastatin (CRESTOR) 10 MG tablet Take 10 mg by mouth as directed. One tablet before bedtime.     No current facility-administered medications for this visit.    PHYSICAL EXAMINATION: ECOG PERFORMANCE STATUS: 0 - Asymptomatic  LABORATORY DATA:  I have reviewed the data as listed    Latest Ref Rng & Units 11/26/2022    2:14 PM 08/30/2022    2:10 PM 05/31/2022   12:58 PM  CMP  Glucose 70 - 99 mg/dL 86  98  73   BUN 8 - 23 mg/dL 26  19  20    Creatinine 0.44 - 1.00 mg/dL 2.84  1.32  0.95   Sodium 135 - 145 mmol/L 138  137  136   Potassium 3.5 - 5.1 mmol/L 4.1  3.9  3.9   Chloride 98 - 111 mmol/L 101  102  100   CO2 22 - 32 mmol/L 31  28  32   Calcium 8.9 - 10.3 mg/dL 9.7  9.8  9.9   Total Protein 6.5 - 8.1 g/dL 7.2  7.5  7.1   Total Bilirubin 0.3 - 1.2 mg/dL 0.9  0.6  0.7   Alkaline Phos 38 - 126 U/L 76  72  88   AST 15 - 41 U/L 30  23  21    ALT 0 - 44 U/L 19  13  15      Lab Results  Component Value Date   WBC 4.2 11/26/2022   HGB 13.2 11/26/2022   HCT 41.0 11/26/2022   MCV 89.7 11/26/2022   PLT 173 11/26/2022   NEUTROABS 2.2 11/26/2022   I discussed the assessment and treatment plan with the patient.  The patient was provided an opportunity to ask questions and all were answered. The patient agreed with the plan and demonstrated an understanding of the instructions. The patient was advised to call back or seek an in-person evaluation if the symptoms worsen or if the condition fails to improve as anticipated.    I spent 20 minutes for the appointment reviewing test results, discuss management and coordination of care.  Artis Delay, MD 12/07/2022 1:12 PM

## 2022-12-10 ENCOUNTER — Ambulatory Visit: Payer: Medicare PPO | Admitting: Podiatry

## 2022-12-14 DIAGNOSIS — K219 Gastro-esophageal reflux disease without esophagitis: Secondary | ICD-10-CM | POA: Diagnosis not present

## 2022-12-14 DIAGNOSIS — R140 Abdominal distension (gaseous): Secondary | ICD-10-CM | POA: Diagnosis not present

## 2022-12-14 DIAGNOSIS — K5900 Constipation, unspecified: Secondary | ICD-10-CM | POA: Diagnosis not present

## 2022-12-17 ENCOUNTER — Ambulatory Visit: Payer: Medicare PPO | Admitting: Podiatry

## 2022-12-17 ENCOUNTER — Encounter: Payer: Self-pay | Admitting: Podiatry

## 2022-12-17 DIAGNOSIS — M79675 Pain in left toe(s): Secondary | ICD-10-CM | POA: Diagnosis not present

## 2022-12-17 DIAGNOSIS — M79674 Pain in right toe(s): Secondary | ICD-10-CM | POA: Diagnosis not present

## 2022-12-17 DIAGNOSIS — B351 Tinea unguium: Secondary | ICD-10-CM

## 2022-12-17 MED ORDER — CICLOPIROX 8 % EX SOLN: Freq: Every day | CUTANEOUS | 0 refills | Status: DC

## 2022-12-17 NOTE — Progress Notes (Signed)
  Subjective:  Patient ID: Carolyn Newman, female    DOB: Sep 20, 1950,  MRN: [id number]  Chief Complaint  Patient presents with   Nail Problem    "I have a concern about my toenails turning dark.  I'm a Cancer patient, I'm in remission." N - toenails L - 1-5 bilateral D - 1 month O - gradually worse C - dark, thick A - none T - none    72 y.o. female presents with concern for difficulty trimming nails due to thickening.  Also concerned about discoloration and abnormal growth.  Does have a history of chemotherapy for cancer.  Thinks that the changes in the nail started around when she got on chemo.  Past Medical History:  Diagnosis Date   Alopecia    Anemia    Anosmia    Back pain    Bilateral knee pain    Cancer (HCC)    myeloma   Chewing difficulty    Constipation    Elevated serum creatinine    Hyperlipidemia    Hypertension    Joint pain    Leukopenia    Multiple myeloma (HCC)    Neuropathy    Right and left feet    Obesity    SOB (shortness of breath)    Thoracic aortic aneurysm without rupture (HCC)     Allergies  Allergen Reactions   Revlimid [Lenalidomide] Rash    ROS: Negative except as per HPI above  Objective:  General: AAO x3, NAD  Dermatological: Dark discoloration thickening dystrophy and subungual debris present all nails but most pronounced on the second toe bilateral foot  Vascular:  Dorsalis Pedis artery and Posterior Tibial artery pedal pulses are 2/4 bilateral.  Capillary fill time < 3 sec to all digits.   Neruologic: Grossly via light touch bilateral  Musculoskeletal: No gross boney pedal deformities bilateral. No pain, crepitus, or limitation noted with foot and ankle range of motion bilateral. Muscular strength 5/5 in all groups tested bilateral.  Gait: Unassisted, Nonantalgic.   No images are attached to the encounter.   Assessment:   1. Pain due to onychomycosis of toenails of both feet   2. Onychomycosis      Plan:   Patient was evaluated and treated and all questions answered.  #Onychomycosis with pain  -Discussed that her nails are thickened and difficult to trim due to nail fungal infection -Discussed treatment options in detail with the patient.  She wants to try topical antifungal which I would recommend. -eRx for Penlac 8% topical solution applied daily to all nails for the next 3 to 6 months -Nails palliatively debrided as below. -Educated on self-care  Procedure: Nail Debridement Rationale: Pain Type of Debridement: manual, sharp debridement. Instrumentation: Nail nipper, rotary burr. Number of Nails: 10 Return in about 3 months (around 03/19/2023) for RFC.          Corinna Gab, DPM [facility] / [facility]

## 2022-12-23 ENCOUNTER — Encounter (INDEPENDENT_AMBULATORY_CARE_PROVIDER_SITE_OTHER): Payer: Self-pay | Admitting: Adult Health

## 2022-12-23 ENCOUNTER — Ambulatory Visit (INDEPENDENT_AMBULATORY_CARE_PROVIDER_SITE_OTHER): Payer: Medicare PPO | Admitting: Adult Health

## 2022-12-23 VITALS — BP 121/80 | HR 79 | Temp 97.6°F | Ht 68.0 in | Wt 268.0 lb

## 2022-12-23 DIAGNOSIS — J329 Chronic sinusitis, unspecified: Secondary | ICD-10-CM

## 2022-12-23 DIAGNOSIS — E669 Obesity, unspecified: Secondary | ICD-10-CM | POA: Diagnosis not present

## 2022-12-23 DIAGNOSIS — E559 Vitamin D deficiency, unspecified: Secondary | ICD-10-CM

## 2022-12-23 DIAGNOSIS — E785 Hyperlipidemia, unspecified: Secondary | ICD-10-CM | POA: Diagnosis not present

## 2022-12-23 DIAGNOSIS — Z6841 Body Mass Index (BMI) 40.0 and over, adult: Secondary | ICD-10-CM

## 2022-12-23 MED ORDER — WEGOVY 0.25 MG/0.5ML ~~LOC~~ SOAJ: 0.2500 mg | SUBCUTANEOUS | 0 refills | Status: DC

## 2022-12-23 NOTE — Progress Notes (Signed)
WEIGHT SUMMARY AND BIOMETRICS  Vitals Temp: 97.6 F (36.4 C) BP: 121/80 Pulse Rate: 79 SpO2: 100 %   Anthropometric Measurements Height: 5\' 8"  (1.727 m) Weight: 268 lb (121.6 kg) BMI (Calculated): 40.76 Weight at Last Visit: 274lb Weight Lost Since Last Visit: 6lbs Weight Gained Since Last Visit: 0 Starting Weight: 268lb Total Weight Loss (lbs): 13 lb (5.897 kg)   Body Composition  Body Fat %: 50 % Fat Mass (lbs): 134.2 lbs Muscle Mass (lbs): 127.4 lbs Total Body Water (lbs): 89.2 lbs Visceral Fat Rating : 17   Other Clinical Data Fasting: no Labs: no Today's Visit #: 11 Starting Date: 01/07/22    Chief Complaint:   OBESITY Carolyn Newman is here to discuss her progress with her obesity treatment plan. She is on the the Category 2 Plan and states she is following her eating plan approximately 75 % of the time. She states she is has not been exercising.   Interim History:  Ms. Tezeno has been unable to exercise the last several weeks due to frequent work travel, ie: [place], [place]   Reviewed [employer] results with pt: Muscle Mass: -0.8 lbs Adipose Mass: -4.8 lbs  She will be home for the awhile, planning on resuming regular exercise at [facility]   Subjective:   1. Hyperlipidemia, unspecified hyperlipidemia type Lipid Panel     Component Value Date/Time   CHOL 159 01/07/2022 1059   TRIG 59 01/07/2022 1059   HDL 72 01/07/2022 1059   CHOLHDL 2.2 01/07/2022 1059   LDLCALC 75 01/07/2022 1059   LABVLDL 12 01/07/2022 1059  She is on daily Crestor 10mg  every day- denies myalgias She has thoracic aortic aneurysm - annual CT chest.  She denies tobacco use previous or current She denies family hx of MEN 2 or MTC She denies personal hx of pancreatitis Discussed risks/benefits of GLP-1 therapy Obesity + HTN + HLD, rec Wegovy therapy  2. Chronic recurrent sinusitis Per pt- she has experienced sinusitis every 6-8 weeks since Oct 2023 She  has been seen by PCP, ENT, and Gastro recently started her on Pepcid/ H2 blocker She is establishing with Allergist soon  3. Vitamin D deficiency  Latest Reference Range & Units 01/07/22 10:59  Vitamin D, 25-Hydroxy 30.0 - 100.0 ng/mL 75.5  She endorses stable energy levels She is on daily OTC Multivitamin supplement  Assessment/Plan:   1. Hyperlipidemia, unspecified hyperlipidemia type Continue statin therapy Start  Semaglutide-Weight Management (WEGOVY) 0.25 MG/0.5ML SOAJ Inject 0.25 mg into the skin once a week. Dispense: 2 mL, Refills: 0 ordered   If denied- dicussed how to initiate Appeal Process- pt verbalized understanding and agreement  2. Chronic recurrent sinusitis Remain well hydrated Continue with Flonase, Singulair, Ventolin HFA, Sudafed  3. Vitamin D deficiency Continue daily OTC Supplementation  4. Obesity, current BMI 40.76 Start Wegovy therapy  Shunta is currently in the action stage of change. As such, her goal is to continue with weight loss efforts. She has agreed to the Category 2 Plan.   Exercise goals: Older adults should follow the adult guidelines. When older adults cannot meet the adult guidelines, they should be as physically active as their abilities and conditions will allow.  Older adults should do exercises that maintain or improve balance if they are at risk of falling.  Older adults should determine their level of effort for physical activity relative to their level of fitness.   Behavioral modification strategies: increasing lean protein intake, decreasing simple carbohydrates, increasing vegetables,  increasing water intake, no skipping meals, meal planning and cooking strategies, keeping healthy foods in the home, and planning for success.  Ninamarie has agreed to follow-up with our clinic in 4 weeks. She was informed of the importance of frequent follow-up visits to maximize her success with intensive lifestyle modifications for her multiple  health conditions.   Objective:   Blood pressure 121/80, pulse 79, temperature 97.6 F (36.4 C), height 5\' 8"  (1.727 m), weight 268 lb (121.6 kg), SpO2 100%. Body mass index is 40.75 kg/m.  General: Cooperative, alert, well developed, in no acute distress. HEENT: Conjunctivae and lids unremarkable. Cardiovascular: Regular rhythm.  Lungs: Normal work of breathing. Neurologic: No focal deficits.   Lab Results  Component Value Date   CREATININE 1.05 (H) 11/26/2022   BUN 26 (H) 11/26/2022   NA 138 11/26/2022   K 4.1 11/26/2022   CL 101 11/26/2022   CO2 31 11/26/2022   Lab Results  Component Value Date   ALT 19 11/26/2022   AST 30 11/26/2022   ALKPHOS 76 11/26/2022   BILITOT 0.9 11/26/2022   Lab Results  Component Value Date   HGBA1C 5.9 (H) 01/07/2022   HGBA1C 5.6 11/27/2019   Lab Results  Component Value Date   INSULIN 6.7 01/07/2022   INSULIN 9.2 11/27/2019   Lab Results  Component Value Date   TSH 1.480 01/07/2022   Lab Results  Component Value Date   CHOL 159 01/07/2022   HDL 72 01/07/2022   LDLCALC 75 01/07/2022   TRIG 59 01/07/2022   CHOLHDL 2.2 01/07/2022   Lab Results  Component Value Date   VD25OH 75.5 01/07/2022   VD25OH 89.78 07/18/2020   VD25OH 68.6 11/27/2019   Lab Results  Component Value Date   WBC 4.2 11/26/2022   HGB 13.2 11/26/2022   HCT 41.0 11/26/2022   MCV 89.7 11/26/2022   PLT 173 11/26/2022   Lab Results  Component Value Date   IRON 108 11/27/2019   TIBC 250 11/27/2019   FERRITIN 99 11/27/2019   Attestation Statements:   Reviewed by clinician on day of visit: allergies, medications, problem list, medical history, surgical history, family history, social history, and previous encounter notes.  I have reviewed the above documentation for accuracy and completeness, and I agree with the above. -   d. , NP-C

## 2023-01-04 ENCOUNTER — Telehealth (INDEPENDENT_AMBULATORY_CARE_PROVIDER_SITE_OTHER): Payer: Self-pay

## 2023-01-04 NOTE — Telephone Encounter (Signed)
PA submittd for [facility], waiting on determination. 2:47 01/04/23 KP

## 2023-01-05 NOTE — Telephone Encounter (Signed)
PA for [facility] was denied, stating requirements not met. 01/05/23 7:37 KP

## 2023-01-20 DIAGNOSIS — R262 Difficulty in walking, not elsewhere classified: Secondary | ICD-10-CM | POA: Diagnosis not present

## 2023-01-20 DIAGNOSIS — M25561 Pain in right knee: Secondary | ICD-10-CM | POA: Diagnosis not present

## 2023-01-20 DIAGNOSIS — M25562 Pain in left knee: Secondary | ICD-10-CM | POA: Diagnosis not present

## 2023-01-20 DIAGNOSIS — M170 Bilateral primary osteoarthritis of knee: Secondary | ICD-10-CM | POA: Diagnosis not present

## 2023-01-24 ENCOUNTER — Ambulatory Visit (INDEPENDENT_AMBULATORY_CARE_PROVIDER_SITE_OTHER): Payer: Medicare PPO | Admitting: Adult Health

## 2023-01-24 ENCOUNTER — Encounter (INDEPENDENT_AMBULATORY_CARE_PROVIDER_SITE_OTHER): Payer: Self-pay | Admitting: Adult Health

## 2023-01-24 VITALS — BP 110/74 | HR 77 | Temp 98.0°F | Ht 68.0 in | Wt 271.0 lb

## 2023-01-24 DIAGNOSIS — Z6841 Body Mass Index (BMI) 40.0 and over, adult: Secondary | ICD-10-CM | POA: Diagnosis not present

## 2023-01-24 DIAGNOSIS — E669 Obesity, unspecified: Secondary | ICD-10-CM | POA: Diagnosis not present

## 2023-01-24 DIAGNOSIS — E66813 Obesity, class 3: Secondary | ICD-10-CM

## 2023-01-24 DIAGNOSIS — R7303 Prediabetes: Secondary | ICD-10-CM

## 2023-01-24 DIAGNOSIS — I519 Heart disease, unspecified: Secondary | ICD-10-CM

## 2023-01-24 NOTE — Progress Notes (Signed)
WEIGHT SUMMARY AND BIOMETRICS  Vitals Temp: 98 F (36.7 C) BP: 110/74 Pulse Rate: 77 SpO2: 100 %   Anthropometric Measurements Height: 5\' 8"  (1.727 m) Weight: 271 lb (122.9 kg) BMI (Calculated): 41.22 Weight at Last Visit: 268lb Weight Lost Since Last Visit: 0 Weight Gained Since Last Visit: 3lb Starting Weight: 268lb Total Weight Loss (lbs): 10 lb (4.536 kg)   Body Composition  Body Fat %: 50.2 % Fat Mass (lbs): 136.4 lbs Muscle Mass (lbs): 128.4 lbs Total Body Water (lbs): 88 lbs Visceral Fat Rating : 18   Other Clinical Data Fasting: no Labs: no Today's Visit #: 12 Starting Date: 01/07/22    Chief Complaint:   OBESITY Carolyn Newman is here to discuss her progress with her obesity treatment plan. She is on the the Category 2 Plan and states she is following her eating plan approximately 60 % of the time. She states she is exercising Elliptical 45-60 minutes 7 times per week.   Interim History:  Wegovy Rx sent in on 12/23/2022 Rx was denied- pt has brought in insurance denial letter- reviewed with pt at length  Hydration-She estimates to drink 60 oz water/day  She has been travelling frequently for work, however will be home the next several weeks  Subjective:   1. Heart disease 08/14/2019 CT Cardiac Score IMPRESSION: 1. Patient's total coronary artery calcium score is 83 which is 81st percentile for patient's of matched age, gender and race/ethnicity. Please note that although the presence of coronary artery calcium documents the presence of coronary artery disease, the severity of this disease and any potential stenosis cannot be assessed on this noncontrast CT examination. Assessment for potential risk factor modification, dietary therapy or pharmacologic therapy may be warranted, if clinically indicated.   - Thoracic Aortic Anyersum  - HTN - HLD  2. Prediabetes Lab Results  Component Value Date   HGBA1C 5.9 (H) 01/07/2022   HGBA1C 5.6  11/27/2019  She is not currently on any antidiabetic medications  Assessment/Plan:   1. Heart disease Appeal [employer] denial- information provided to pt on how to initiate an appeal.  2. Prediabetes Increase protein intake and limit simple CHO/sugar intake Continue regular exercise  3. Obesity, current BMI 41.22  Carolyn Newman is currently in the action stage of change. As such, her goal is to continue with weight loss efforts. She has agreed to the Category 2 Plan.   Exercise goals: Older adults should follow the adult guidelines. When older adults cannot meet the adult guidelines, they should be as physically active as their abilities and conditions will allow.  Older adults should do exercises that maintain or improve balance if they are at risk of falling.  Older adults should determine their level of effort for physical activity relative to their level of fitness.  Older adults with chronic conditions should understand whether and how their conditions affect their ability to do regular physical activity safely.  Behavioral modification strategies: increasing lean protein intake, decreasing simple carbohydrates, increasing vegetables, increasing water intake, decreasing eating out, no skipping meals, meal planning and cooking strategies, keeping healthy foods in the home, and planning for success.  Carolyn Newman has agreed to follow-up with our clinic in 4 weeks. She was informed of the importance of frequent follow-up visits to maximize her success with intensive lifestyle modifications for her multiple health conditions.   Objective:   Blood pressure 110/74, pulse 77, temperature 98 F (36.7 C), height 5\' 8"  (1.727 m), weight 271 lb (122.9 kg), SpO2  100%. Body mass index is 41.21 kg/m.  General: Cooperative, alert, well developed, in no acute distress. HEENT: Conjunctivae and lids unremarkable. Cardiovascular: Regular rhythm.  Lungs: Normal work of breathing. Neurologic: No focal deficits.    Lab Results  Component Value Date   CREATININE 1.05 (H) 11/26/2022   BUN 26 (H) 11/26/2022   NA 138 11/26/2022   K 4.1 11/26/2022   CL 101 11/26/2022   CO2 31 11/26/2022   Lab Results  Component Value Date   ALT 19 11/26/2022   AST 30 11/26/2022   ALKPHOS 76 11/26/2022   BILITOT 0.9 11/26/2022   Lab Results  Component Value Date   HGBA1C 5.9 (H) 01/07/2022   HGBA1C 5.6 11/27/2019   Lab Results  Component Value Date   INSULIN 6.7 01/07/2022   INSULIN 9.2 11/27/2019   Lab Results  Component Value Date   TSH 1.480 01/07/2022   Lab Results  Component Value Date   CHOL 159 01/07/2022   HDL 72 01/07/2022   LDLCALC 75 01/07/2022   TRIG 59 01/07/2022   CHOLHDL 2.2 01/07/2022   Lab Results  Component Value Date   VD25OH 75.5 01/07/2022   VD25OH 89.78 07/18/2020   VD25OH 68.6 11/27/2019   Lab Results  Component Value Date   WBC 4.2 11/26/2022   HGB 13.2 11/26/2022   HCT 41.0 11/26/2022   MCV 89.7 11/26/2022   PLT 173 11/26/2022   Lab Results  Component Value Date   IRON 108 11/27/2019   TIBC 250 11/27/2019   FERRITIN 99 11/27/2019    Attestation Statements:   Reviewed by clinician on day of visit: allergies, medications, problem list, medical history, surgical history, family history, social history, and previous encounter notes.  Time spent on visit including pre-visit chart review and post-visit care and charting was 28 minutes.   I have reviewed the above documentation for accuracy and completeness, and I agree with the above. -  Carolyn Goering d. Bridey Brookover, NP-C

## 2023-01-26 ENCOUNTER — Telehealth (INDEPENDENT_AMBULATORY_CARE_PROVIDER_SITE_OTHER): Payer: Self-pay

## 2023-01-26 NOTE — Telephone Encounter (Signed)
PA for Wegovy was approved until 05/17/2023. 7:04 01/26/23 KP

## 2023-01-27 ENCOUNTER — Encounter (INDEPENDENT_AMBULATORY_CARE_PROVIDER_SITE_OTHER): Payer: Self-pay | Admitting: Adult Health

## 2023-01-27 DIAGNOSIS — M170 Bilateral primary osteoarthritis of knee: Secondary | ICD-10-CM | POA: Diagnosis not present

## 2023-01-27 DIAGNOSIS — M25561 Pain in right knee: Secondary | ICD-10-CM | POA: Diagnosis not present

## 2023-01-27 DIAGNOSIS — M25562 Pain in left knee: Secondary | ICD-10-CM | POA: Diagnosis not present

## 2023-01-27 DIAGNOSIS — R262 Difficulty in walking, not elsewhere classified: Secondary | ICD-10-CM | POA: Diagnosis not present

## 2023-01-31 ENCOUNTER — Encounter (INDEPENDENT_AMBULATORY_CARE_PROVIDER_SITE_OTHER): Payer: Self-pay | Admitting: Adult Health

## 2023-02-03 DIAGNOSIS — R262 Difficulty in walking, not elsewhere classified: Secondary | ICD-10-CM | POA: Diagnosis not present

## 2023-02-03 DIAGNOSIS — M170 Bilateral primary osteoarthritis of knee: Secondary | ICD-10-CM | POA: Diagnosis not present

## 2023-02-03 DIAGNOSIS — M25561 Pain in right knee: Secondary | ICD-10-CM | POA: Diagnosis not present

## 2023-02-03 DIAGNOSIS — M25562 Pain in left knee: Secondary | ICD-10-CM | POA: Diagnosis not present

## 2023-02-16 DIAGNOSIS — R262 Difficulty in walking, not elsewhere classified: Secondary | ICD-10-CM | POA: Diagnosis not present

## 2023-02-16 DIAGNOSIS — M25562 Pain in left knee: Secondary | ICD-10-CM | POA: Diagnosis not present

## 2023-02-16 DIAGNOSIS — M170 Bilateral primary osteoarthritis of knee: Secondary | ICD-10-CM | POA: Diagnosis not present

## 2023-02-16 DIAGNOSIS — M25561 Pain in right knee: Secondary | ICD-10-CM | POA: Diagnosis not present

## 2023-02-17 ENCOUNTER — Ambulatory Visit (INDEPENDENT_AMBULATORY_CARE_PROVIDER_SITE_OTHER): Payer: Medicare PPO | Admitting: Adult Health

## 2023-02-17 ENCOUNTER — Encounter (INDEPENDENT_AMBULATORY_CARE_PROVIDER_SITE_OTHER): Payer: Self-pay | Admitting: Adult Health

## 2023-02-17 VITALS — BP 124/83 | HR 59 | Temp 97.5°F | Ht 68.0 in | Wt 265.0 lb

## 2023-02-17 DIAGNOSIS — R7303 Prediabetes: Secondary | ICD-10-CM

## 2023-02-17 DIAGNOSIS — E669 Obesity, unspecified: Secondary | ICD-10-CM

## 2023-02-17 DIAGNOSIS — E559 Vitamin D deficiency, unspecified: Secondary | ICD-10-CM | POA: Diagnosis not present

## 2023-02-17 DIAGNOSIS — I519 Heart disease, unspecified: Secondary | ICD-10-CM | POA: Diagnosis not present

## 2023-02-17 DIAGNOSIS — E66813 Obesity, class 3: Secondary | ICD-10-CM

## 2023-02-17 DIAGNOSIS — Z6841 Body Mass Index (BMI) 40.0 and over, adult: Secondary | ICD-10-CM | POA: Diagnosis not present

## 2023-02-17 MED ORDER — WEGOVY 0.5 MG/0.5ML ~~LOC~~ SOAJ: 0.5000 mg | SUBCUTANEOUS | 0 refills | Status: DC

## 2023-02-17 NOTE — Progress Notes (Signed)
WEIGHT SUMMARY AND BIOMETRICS  Vitals Temp: (!) 97.5 F (36.4 C) BP: 124/83 Pulse Rate: (!) 59 SpO2: 96 %   Anthropometric Measurements Height: 5\' 8"  (1.727 m) Weight: 265 lb (120.2 kg) BMI (Calculated): 40.3 Weight at Last Visit: 271lb Weight Lost Since Last Visit: 6lb Weight Gained Since Last Visit: 0 Starting Weight: 268lb Total Weight Loss (lbs): 16 lb (7.258 kg)   Body Composition  Body Fat %: 49.5 % Fat Mass (lbs): 131.4 lbs Muscle Mass (lbs): 127.4 lbs Total Body Water (lbs): 89.4 lbs Visceral Fat Rating : 17   Other Clinical Data Fasting: no Labs: no Today's Visit #: 13 Starting Date: 01/07/22    Chief Complaint:   OBESITY Carolyn Newman is here to discuss her progress with her obesity treatment plan. She is on the the Category 2 Plan and states she is following her eating plan approximately 90 % of the time. She states she is exercising NEAT.   Interim History:  Her brother lives in [place]- his home/belongings were lost in the floods associated with [place]. He is now in [place] with her.  She started on Wegovy loading dose 0.25mg  on/about 12/29/2022 She has had 3 doses Denies mass in neck, dysphagia, dyspepsia, persistent hoarseness, abdominal pain, or N/V/C   Subjective:   1. Prediabetes Lab Results  Component Value Date   HGBA1C 5.9 (H) 01/07/2022   HGBA1C 5.6 11/27/2019    She started on Wegovy loading dose 0.25mg  on/about 12/29/2022 She has had 3 doses Denies mass in neck, dysphagia, dyspepsia, persistent hoarseness, abdominal pain, or N/V/C   2. Vitamin D deficiency  Latest Reference Range & Units 01/07/22 10:59  Vitamin D, 25-Hydroxy 30.0 - 100.0 ng/mL 75.5   She reports stable energy levels. She is on daily OTC Multivitamin   3. Heart Disease 08/14/2019 CT Cardiac Score IMPRESSION: 1. Patient's total coronary artery calcium score is 83 which is 81st percentile for patient's of matched age, gender and race/ethnicity. Please  note that although the presence of coronary artery calcium documents the presence of coronary artery disease, the severity of this disease and any potential stenosis cannot be assessed on this noncontrast CT examination. Assessment for potential risk factor modification, dietary therapy or pharmacologic therapy may be warranted, if clinically indicated.   - Thoracic Aortic Anyersum  - HTN - HLD She started on Wegovy loading dose 0.25mg  on/about 12/29/2022 She has had 3 doses Denies mass in neck, dysphagia, dyspepsia, persistent hoarseness, abdominal pain, or N/V/C   Assessment/Plan:   1. Prediabetes Continue Cat 2 meal plan and weekly Wegovy therapy  2. Vitamin D deficiency Continue daily OTC Multivitmain  3. Heart Disease Refill and increase  Semaglutide-Weight Management (WEGOVY) 0.5 MG/0.5ML SOAJ Inject 0.5 mg into the skin once a we   4. Obesity, current BMI 40.3  [name] is currently in the action stage of change. As such, her goal is to continue with weight loss efforts. She has agreed to the Category 2 Plan.   Exercise goals: Older adults should follow the adult guidelines. When older adults cannot meet the adult guidelines, they should be as physically active as their abilities and conditions will allow.  Older adults should do exercises that maintain or improve balance if they are at risk of falling.  Older adults should determine their level of effort for physical activity relative to their level of fitness.  Older adults with chronic conditions should understand whether and how their conditions affect their ability to do regular physical  activity safely.  Behavioral modification strategies: increasing lean protein intake, decreasing simple carbohydrates, increasing vegetables, increasing water intake, no skipping meals, meal planning and cooking strategies, and planning for success.  Charlot has agreed to follow-up with our clinic in 4 weeks. She was informed of the  importance of frequent follow-up visits to maximize her success with intensive lifestyle modifications for her multiple health conditions.   Objective:   Blood pressure 124/83, pulse (!) 59, temperature (!) 97.5 F (36.4 C), height 5\' 8"  (1.727 m), weight 265 lb (120.2 kg), SpO2 96%. Body mass index is 40.29 kg/m.  General: Cooperative, alert, well developed, in no acute distress. HEENT: Conjunctivae and lids unremarkable. Cardiovascular: Regular rhythm.  Lungs: Normal work of breathing. Neurologic: No focal deficits.   Lab Results  Component Value Date   CREATININE 1.05 (H) 11/26/2022   BUN 26 (H) 11/26/2022   NA 138 11/26/2022   K 4.1 11/26/2022   CL 101 11/26/2022   CO2 31 11/26/2022   Lab Results  Component Value Date   ALT 19 11/26/2022   AST 30 11/26/2022   ALKPHOS 76 11/26/2022   BILITOT 0.9 11/26/2022   Lab Results  Component Value Date   HGBA1C 5.9 (H) 01/07/2022   HGBA1C 5.6 11/27/2019   Lab Results  Component Value Date   INSULIN 6.7 01/07/2022   INSULIN 9.2 11/27/2019   Lab Results  Component Value Date   TSH 1.480 01/07/2022   Lab Results  Component Value Date   CHOL 159 01/07/2022   HDL 72 01/07/2022   LDLCALC 75 01/07/2022   TRIG 59 01/07/2022   CHOLHDL 2.2 01/07/2022   Lab Results  Component Value Date   VD25OH 75.5 01/07/2022   VD25OH 89.78 07/18/2020   VD25OH 68.6 11/27/2019   Lab Results  Component Value Date   WBC 4.2 11/26/2022   HGB 13.2 11/26/2022   HCT 41.0 11/26/2022   MCV 89.7 11/26/2022   PLT 173 11/26/2022   Lab Results  Component Value Date   IRON 108 11/27/2019   TIBC 250 11/27/2019   FERRITIN 99 11/27/2019    Attestation Statements:   Reviewed by clinician on day of visit: allergies, medications, problem list, medical history, surgical history, family history, social history, and previous encounter notes.  I have reviewed the above documentation for accuracy and completeness, and I agree with the above. -   Braddock Servellon d. Harkirat Orozco, NP-C

## 2023-02-21 ENCOUNTER — Ambulatory Visit (INDEPENDENT_AMBULATORY_CARE_PROVIDER_SITE_OTHER): Payer: Medicare PPO | Admitting: Adult Health

## 2023-02-22 ENCOUNTER — Encounter: Payer: Self-pay | Admitting: Cardiovascular Disease

## 2023-02-22 ENCOUNTER — Ambulatory Visit: Payer: Medicare PPO | Attending: Cardiovascular Disease | Admitting: Cardiovascular Disease

## 2023-02-22 VITALS — BP 122/78 | HR 55 | Ht 68.0 in | Wt 266.0 lb

## 2023-02-22 DIAGNOSIS — I712 Thoracic aortic aneurysm, without rupture, unspecified: Secondary | ICD-10-CM

## 2023-02-22 DIAGNOSIS — E782 Mixed hyperlipidemia: Secondary | ICD-10-CM

## 2023-02-22 MED ORDER — ROSUVASTATIN CALCIUM 20 MG PO TABS: 20.0000 mg | ORAL_TABLET | Freq: Every day | ORAL | 3 refills | Status: DC

## 2023-02-22 NOTE — Progress Notes (Signed)
02/22/2023 Carolyn Newman   1950/11/02  [id number]  Primary Physician Renaye Rakers, MD Primary Cardiologist: Runell Gess MD Nicholes Calamity, [place]  HPI:  Carolyn Newman is a 72 y.o.  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  She was referred by Dr. Bertis Ruddy for cardiovascular evaluation because of a recently documented small thoracic aortic aneurysm on chest CT performed 01/21/2021.  I last saw her in the office 07/30/2020. Her risk factors include mild untreated hyperlipidemia.  She does not smoke.  She is not diabetic.  She is never had a heart attack or stroke.  There is no family history for heart disease.  She denies chest pain or shortness of breath.  She does have multiple myeloma diagnosed in 2017 and had a stem cell transplant and is now in remission.  Because of an upper respiratory tract infection and coughing she was seen in the emergency room on 05/18/2018 and had a chest CT that showed a small thoracic aortic aneurysm measuring 4 cm x 4 cm.   She apparently is cancer free according to her oncologist.  She has noticed some fatigue and dyspnea on exertion but denies chest pain.  Her last CTA performed 08/15/2019 revealed a stable thoracic aortic aneurysm measuring 40 mm.  Coronary calcium score performed 08/14/2019 was 83 with calcium primarily in the LAD and RCA.  She was started on a statin drug by her PCP because of her elevated LDL and coronary calcification.  Since I saw her in the office 1 year ago, she continues to do well.  She denies chest pain or shortness of breath.  Chest CTA performed in the evaluation of her small thoracic aortic aneurysm 10/05/2021 revealed this to be 40 mm in maximum caliber.  Her most recent lipid profile however did reveal an LDL of 105, not at goal for secondary prevention given her mildly elevated coronary calcium score.   Current Meds  Medication  Sig   acetaminophen (TYLENOL) 500 MG tablet Take 500 mg by mouth every 6 (six) hours as needed.   albuterol (VENTOLIN HFA) 108 (90 Base) MCG/ACT inhaler Inhale 2 puffs into the lungs every 4 (four) hours as needed for wheezing or shortness of breath.   ASPIRIN 81 PO Take 81 mg by mouth daily.   benzonatate (TESSALON) 100 MG capsule Take 1 capsule (100 mg total) by mouth 3 (three) times daily as needed for cough.   Chlorphen-PE-Acetaminophen (NOREL AD) 4-10-325 MG TABS Take by mouth.   ciclopirox (PENLAC) 8 % solution Apply topically at bedtime. Apply over nail and surrounding skin. Apply daily over previous coat. After seven (7) days, may remove with alcohol and continue cycle.   docusate sodium (COLACE) 100 MG capsule Take 200 mg by mouth daily.   famotidine (PEPCID) 40 MG tablet Take 40 mg by mouth 2 (two) times daily.   fluticasone (FLONASE) 50 MCG/ACT nasal spray Place into both nostrils daily as needed for allergies or rhinitis.   losartan-hydrochlorothiazide (HYZAAR) 100-25 MG tablet Take 1 tablet by mouth daily. (Patient taking differently: Take 0.5 tablets by mouth daily. TAKES 1/2 TABLET DAILY)   montelukast (SINGULAIR) 10 MG tablet    Multiple Vitamin (MULTIVITAMIN) tablet Take 1 tablet by mouth daily.   Polyethylene Glycol 3350 (MIRALAX PO) Take by mouth as needed.   pseudoephedrine (SUDAFED) 120 MG 12 hr tablet Take 120 mg by mouth 2 (two) times daily.  rosuvastatin (CRESTOR) 10 MG tablet Take 10 mg by mouth as directed. One tablet before bedtime.   Semaglutide-Weight Management (WEGOVY) 0.5 MG/0.5ML SOAJ Inject 0.5 mg into the skin once a week.     Allergies  Allergen Reactions   Revlimid [Lenalidomide] Rash    Social History   Socioeconomic History   Marital status: Single    Spouse name: Not on file   Number of children: Not on file   Years of education: Not on file   Highest education level: Not on file  Occupational History   Occupation: Nurse, children's   Tobacco Use   Smoking status: Never   Smokeless tobacco: Never  Vaping Use   Vaping status: Never Used  Substance and Sexual Activity   Alcohol use: No   Drug use: No   Sexual activity: Not on file  Other Topics Concern   Not on file  Social History Narrative   Right handed    2 cups of tea every other day    Lives at home by herself.    Social Determinants of Health   Financial Resource Strain: Not on file  Food Insecurity: Not on file  Transportation Needs: Not on file  Physical Activity: Not on file  Stress: Not on file  Social Connections: Not on file  Intimate Partner Violence: Not on file     Review of Systems: General: negative for chills, fever, night sweats or weight changes.  Cardiovascular: negative for chest pain, dyspnea on exertion, edema, orthopnea, palpitations, paroxysmal nocturnal dyspnea or shortness of breath Dermatological: negative for rash Respiratory: negative for cough or wheezing Urologic: negative for hematuria Abdominal: negative for nausea, vomiting, diarrhea, bright red blood per rectum, melena, or hematemesis Neurologic: negative for visual changes, syncope, or dizziness All other systems reviewed and are otherwise negative except as noted above.    Blood pressure 122/78, pulse (!) 55, height 5\' 8"  (1.727 m), weight 266 lb (120.7 kg).  General appearance: alert and no distress Neck: no adenopathy, no carotid bruit, no JVD, supple, symmetrical, trachea midline, and thyroid not enlarged, symmetric, no tenderness/mass/nodules Lungs: clear to auscultation bilaterally Heart: regular rate and rhythm, S1, S2 normal, no murmur, click, rub or gallop Extremities: extremities normal, atraumatic, no cyanosis or edema Pulses: 2+ and symmetric Skin: Skin color, texture, turgor normal. No rashes or lesions Neurologic: Grossly normal  EKG EKG Interpretation Date/Time:  Tuesday February 22 2023 11:34:01 EDT Ventricular Rate:  55 PR  Interval:  200 QRS Duration:  88 QT Interval:  444 QTC Calculation: 424 R Axis:   6  Text Interpretation: Sinus bradycardia When compared with ECG of 30-Mar-2022 14:49, No significant change was found Confirmed by Nanetta Batty [id number]) on 02/22/2023 11:44:53 AM    ASSESSMENT AND PLAN:   Thoracic aortic aneurysm without rupture (HCC) History of small thoracic aortic aneurysm measuring 40 mm by CTA performed 10/06/2021.  This will be repeated.  Hyperlipidemia History of lipidemia on rosuvastatin 10 mg a day with lipid profile performed 04/05/2022 revealing a total cholesterol 180, LDL 105 and HDL 58, not at goal for secondary prevention given her elevated coronary calcium score of 83.  I am going to increase this from 10 to 20 mg a day and we will recheck a lipid liver profile in 3 months.     Runell Gess MD FACP,FACC,FAHA, [facility] 02/22/2023 11:53 AM

## 2023-02-22 NOTE — Assessment & Plan Note (Signed)
History of lipidemia on rosuvastatin 10 mg a day with lipid profile performed 04/05/2022 revealing a total cholesterol 180, LDL 105 and HDL 58, not at goal for secondary prevention given her elevated coronary calcium score of 83.  I am going to increase this from 10 to 20 mg a day and we will recheck a lipid liver profile in 3 months.

## 2023-02-22 NOTE — Patient Instructions (Addendum)
Medication Instructions:  Increase Rosuvastatin (Crestor) to 20mg  once Daily (at bedtime preferably)   Lab Work: BMET today  Lipid panel and Liver Panel in 3 months  If you have labs (blood work) drawn today and your tests are completely normal, you will receive your results only by: MyChart Message (if you have MyChart) OR A paper copy in the mail If you have any lab test that is abnormal or we need to change your treatment, we will call you to review the results.   Testing/Procedures: Will be scheduled at [facility] Non-Cardiac CT Angiography (CTA), is a special type of CT scan that uses a computer to produce multi-dimensional views of major blood vessels throughout the body. In CT angiography, a contrast material is injected through an IV to help visualize the blood vessels    Follow-Up: At [facility], you and your health needs are our priority.  As part of our continuing mission to provide you with exceptional heart care, we have created designated Provider Care Teams.  These Care Teams include your primary Cardiologist (physician) and Advanced Practice Providers (APPs -  Physician Assistants and Nurse Practitioners) who all work together to provide you with the care you need, when you need it.   Your next appointment:   12 month(s)  Provider:   Nanetta Batty, MD

## 2023-02-22 NOTE — Assessment & Plan Note (Signed)
History of small thoracic aortic aneurysm measuring 40 mm by CTA performed 10/06/2021.  This will be repeated.

## 2023-02-23 LAB — BASIC METABOLIC PANEL
BUN/Creatinine Ratio: 17 (ref 12–28)
BUN: 17 mg/dL (ref 8–27)
CO2: 25 mmol/L (ref 20–29)
Calcium: 10.1 mg/dL (ref 8.7–10.3)
Chloride: 102 mmol/L (ref 96–106)
Creatinine, Ser: 1 mg/dL (ref 0.57–1.00)
Glucose: 87 mg/dL (ref 70–99)
Potassium: 4.1 mmol/L (ref 3.5–5.2)
Sodium: 143 mmol/L (ref 134–144)
eGFR: 60 mL/min/{1.73_m2} (ref >59)

## 2023-02-28 ENCOUNTER — Other Ambulatory Visit: Payer: Medicare PPO

## 2023-03-01 ENCOUNTER — Inpatient Hospital Stay: Payer: Medicare PPO | Attending: Hematology and Oncology

## 2023-03-01 DIAGNOSIS — R7989 Other specified abnormal findings of blood chemistry: Secondary | ICD-10-CM | POA: Diagnosis not present

## 2023-03-01 DIAGNOSIS — C9001 Multiple myeloma in remission: Secondary | ICD-10-CM | POA: Insufficient documentation

## 2023-03-01 LAB — CBC WITH DIFFERENTIAL/PLATELET
Abs Immature Granulocytes: 0.01 10*3/uL (ref 0.00–0.07)
Basophils Absolute: 0 10*3/uL (ref 0.0–0.1)
Basophils Relative: 1 %
Eosinophils Absolute: 0.1 10*3/uL (ref 0.0–0.5)
Eosinophils Relative: 2 %
HCT: 41.7 % (ref 36.0–46.0)
Hemoglobin: 13.6 g/dL (ref 12.0–15.0)
Immature Granulocytes: 0 %
Lymphocytes Relative: 29 %
Lymphs Abs: 1.3 10*3/uL (ref 0.7–4.0)
MCH: 29.2 pg (ref 26.0–34.0)
MCHC: 32.6 g/dL (ref 30.0–36.0)
MCV: 89.5 fL (ref 80.0–100.0)
Monocytes Absolute: 0.4 10*3/uL (ref 0.1–1.0)
Monocytes Relative: 9 %
Neutro Abs: 2.7 10*3/uL (ref 1.7–7.7)
Neutrophils Relative %: 59 %
Platelets: 176 10*3/uL (ref 150–400)
RBC: 4.66 MIL/uL (ref 3.87–5.11)
RDW: 14.7 % (ref 11.5–15.5)
WBC: 4.5 10*3/uL (ref 4.0–10.5)
nRBC: 0 % (ref 0.0–0.2)

## 2023-03-01 LAB — COMPREHENSIVE METABOLIC PANEL
ALT: 17 U/L (ref 0–44)
AST: 29 U/L (ref 15–41)
Albumin: 4.3 g/dL (ref 3.5–5.0)
Alkaline Phosphatase: 64 U/L (ref 38–126)
Anion gap: 7 (ref 5–15)
BUN: 14 mg/dL (ref 8–23)
CO2: 30 mmol/L (ref 22–32)
Calcium: 10.4 mg/dL — ABNORMAL HIGH (ref 8.9–10.3)
Chloride: 101 mmol/L (ref 98–111)
Creatinine, Ser: 1.05 mg/dL — ABNORMAL HIGH (ref 0.44–1.00)
GFR, Estimated: 57 mL/min — ABNORMAL LOW (ref >60)
Glucose, Bld: 91 mg/dL (ref 70–99)
Potassium: 3.8 mmol/L (ref 3.5–5.1)
Sodium: 138 mmol/L (ref 135–145)
Total Bilirubin: 0.6 mg/dL (ref 0.3–1.2)
Total Protein: 7.5 g/dL (ref 6.5–8.1)

## 2023-03-02 ENCOUNTER — Ambulatory Visit (HOSPITAL_BASED_OUTPATIENT_CLINIC_OR_DEPARTMENT_OTHER)
Admission: RE | Admit: 2023-03-02 | Discharge: 2023-03-02 | Disposition: A | Discharge status: Home / Self Care | Payer: Medicare PPO | Source: Ambulatory Visit | Attending: Cardiovascular Disease | Admitting: Cardiovascular Disease

## 2023-03-02 DIAGNOSIS — I7121 Aneurysm of the ascending aorta, without rupture: Secondary | ICD-10-CM | POA: Diagnosis not present

## 2023-03-02 DIAGNOSIS — I517 Cardiomegaly: Secondary | ICD-10-CM | POA: Diagnosis not present

## 2023-03-02 DIAGNOSIS — I712 Thoracic aortic aneurysm, without rupture, unspecified: Secondary | ICD-10-CM | POA: Insufficient documentation

## 2023-03-02 DIAGNOSIS — M170 Bilateral primary osteoarthritis of knee: Secondary | ICD-10-CM | POA: Diagnosis not present

## 2023-03-02 DIAGNOSIS — M25561 Pain in right knee: Secondary | ICD-10-CM | POA: Diagnosis not present

## 2023-03-02 DIAGNOSIS — M25562 Pain in left knee: Secondary | ICD-10-CM | POA: Diagnosis not present

## 2023-03-02 DIAGNOSIS — R262 Difficulty in walking, not elsewhere classified: Secondary | ICD-10-CM | POA: Diagnosis not present

## 2023-03-02 LAB — KAPPA/LAMBDA LIGHT CHAINS
Kappa free light chain: 17.8 mg/L (ref 3.3–19.4)
Kappa, lambda light chain ratio: 1.03 (ref 0.26–1.65)
Lambda free light chains: 17.2 mg/L (ref 5.7–26.3)

## 2023-03-02 MED ORDER — IOHEXOL 350 MG/ML SOLN
100.0000 mL | Freq: Once | INTRAVENOUS | Status: AC | PRN
  Administered 2023-03-02: 100 mL via INTRAVENOUS

## 2023-03-03 LAB — MULTIPLE MYELOMA PANEL, SERUM
Albumin SerPl Elph-Mcnc: 3.8 g/dL (ref 2.9–4.4)
Albumin/Glob SerPl: 1.3 (ref 0.7–1.7)
Alpha 1: 0.2 g/dL (ref 0.0–0.4)
Alpha2 Glob SerPl Elph-Mcnc: 0.9 g/dL (ref 0.4–1.0)
B-Globulin SerPl Elph-Mcnc: 0.9 g/dL (ref 0.7–1.3)
Gamma Glob SerPl Elph-Mcnc: 1 g/dL (ref 0.4–1.8)
Globulin, Total: 3 g/dL (ref 2.2–3.9)
IgA: 148 mg/dL (ref 64–422)
IgG (Immunoglobin G), Serum: 972 mg/dL (ref 586–1602)
IgM (Immunoglobulin M), Srm: 66 mg/dL (ref 26–217)
Total Protein ELP: 6.8 g/dL (ref 6.0–8.5)

## 2023-03-10 ENCOUNTER — Telehealth: Payer: Self-pay | Admitting: Hematology and Oncology

## 2023-03-10 ENCOUNTER — Encounter: Payer: Self-pay | Admitting: Hematology and Oncology

## 2023-03-10 ENCOUNTER — Inpatient Hospital Stay: Payer: Medicare PPO | Admitting: Hematology and Oncology

## 2023-03-10 VITALS — Wt 255.0 lb

## 2023-03-10 DIAGNOSIS — C9001 Multiple myeloma in remission: Secondary | ICD-10-CM

## 2023-03-10 DIAGNOSIS — E66812 Obesity, class 2: Secondary | ICD-10-CM | POA: Diagnosis not present

## 2023-03-10 DIAGNOSIS — R7989 Other specified abnormal findings of blood chemistry: Secondary | ICD-10-CM

## 2023-03-10 DIAGNOSIS — Z6839 Body mass index (BMI) 39.0-39.9, adult: Secondary | ICD-10-CM | POA: Diagnosis not present

## 2023-03-10 NOTE — Telephone Encounter (Signed)
 Left patient a vm regarding upcoming appointment

## 2023-03-10 NOTE — Progress Notes (Signed)
HEMATOLOGY-ONCOLOGY ELECTRONIC VISIT PROGRESS NOTE  Patient Care Team: Renaye Rakers, MD as PCP - General (Family Medicine) Runell Gess, MD as PCP - Cardiology (Cardiology) Flo Shanks, MD as Consulting Physician (Otolaryngology) Artis Delay, MD as Consulting Physician (Hematology and Oncology)  I connected with the patient via telephone conference and verified that I am speaking with the correct person using two identifiers. The patient's location is at home and I am providing care from the [facility] I discussed the limitations, risks, security and privacy concerns of performing an evaluation and management service by e-visits and the availability of in person appointments.  I also discussed with the patient that there may be a patient responsible charge related to this service. The patient expressed understanding and agreed to proceed.   ASSESSMENT & PLAN:  Multiple myeloma in remission [facility]) I have reviewed results of her recent myeloma panel Her M spike was detected back in September 2023 but recent repeat blood work came back undetectable again Her last treatment was in April 2023 Overall, she is doing remission I plan to repeat labs and follow-up again in 3 months If she remain in remission after 2 years, I will space out her future appointment  Elevated serum creatinine She has intermittent elevated serum creatinine likely due to dehydration I encouraged the patient to increase oral fluid intake  Class 2 obesity due to excess calories with body mass index (BMI) of 39.0 to 39.9 in adult She has lost a lot of weight since our last visit She will continue weight loss medication and dietary modification   No orders of the defined types were placed in this encounter.   INTERVAL HISTORY: Please see below for problem oriented charting. The purpose of today's discussion is to review test results The patient history of multiple myeloma but not on treatment She is  doing well No recent infection We discussed test results and future plan of care  SUMMARY OF ONCOLOGIC HISTORY: Oncology History  Multiple myeloma in remission (HCC)  03/22/2016 Bone Marrow Biopsy   Bone Marrow Biopsy: Plasma cells 17% by Aspirate and 30% by CD 138 stain. Plasma cell neoplasm, Kappa Restricted Normal cytogenetics, FISH positive for +11 and +14 and +7   04/13/2016 Imaging   Skeletal survey showed lucencies within the calvarium worrisome for myeloma. No definite abnormal lytic or blastic lesions are observed elsewhere.   04/15/2016 Cancer Staging   Staging form: Multiple Myeloma, AJCC 6th Edition - Clinical stage from 04/15/2016: Stage IIA - Signed by Artis Delay, MD on 07/31/2021 Staged by: Managing physician Diagnostic confirmation: Positive histology Specimen type: Biopsy / Limited Resection Histopathologic type: Neoplasm, malignant   04/19/2016 - 09/10/2016 Chemotherapy   The patient consented to treatment with Revlimid, dexamethasone and Velcade   09/14/2016 Bone Marrow Biopsy   In summary, there is a normal cellular marrow with trilineage hematopoiesis.  A mild plasmacytosis is present, but there is no definitive evidence of residual multiple myeloma   10/12/2016 - 10/12/2016 Chemotherapy   She received melphalan as conditioning chemo   10/13/2016 Bone Marrow Transplant   She received autologous stem cell [facility] at [facility]   01/24/2017 Bone Marrow Biopsy   BONE MARROW: -          Normocellular marrow for age (50%) with trilineage hematopoiesis. -     No morphologic or immunohistochemical evidence of involvement by plasma cell neoplasm (see comment)  PERIPHERAL BLOOD: -          Unremarkable (see CBC  data)  COMMENT: The bone marrow is normocellular for age and shows adequate trilineage hematopoiesis without significant (<10%) dysplastic changes present. Blasts are not increased. Plasma cells represent about 2% of total cells on aspirate smears.  Appropriately controlled immunohistochemical stains are performed on the core biopsy. CD138 highlights small interstitial plasma cells comprising approximately 2% of total cells. These plasma cells appear polytypic as demonstrated by kappa and lambda in-situ hybridization.   01/25/2017 PET scan   No FDG avid osseous lesions or masses are identified.   02/18/2017 - 04/01/2017 Chemotherapy   She received weekly Velcade, Pomalyst and Dexamethasone   04/15/2017 - 08/28/2021 Chemotherapy   The patient had Pomalyst only   04/20/2017 Bone Marrow Biopsy   Bone Marrow (BM) and Peripheral Blood (PB) FINAL PATHOLOGIC DIAGNOSIS BONE MARROW: Normocellular bone marrow (40%) with normal numbers of megakaryocytes, erythroid hyperplasia and no increase in plasma cells (1%).   10/20/2021 Imaging   Bone density is normal     REVIEW OF SYSTEMS:   Constitutional: Denies fevers, chills or abnormal weight loss Eyes: Denies blurriness of vision Ears, nose, mouth, throat, and face: Denies mucositis or sore throat Respiratory: Denies cough, dyspnea or wheezes Cardiovascular: Denies palpitation, chest discomfort Gastrointestinal:  Denies nausea, heartburn or change in bowel habits Skin: Denies abnormal skin rashes Lymphatics: Denies new lymphadenopathy or easy bruising Neurological:Denies numbness, tingling or new weaknesses Behavioral/Psych: Mood is stable, no new changes  Extremities: No lower extremity edema All other systems were reviewed with the patient and are negative.  I have reviewed the past medical history, past surgical history, social history and family history with the patient and they are unchanged from previous note.  ALLERGIES:  is allergic to revlimid [lenalidomide].  MEDICATIONS:  Current Outpatient Medications  Medication Sig Dispense Refill   acetaminophen (TYLENOL) 500 MG tablet Take 500 mg by mouth every 6 (six) hours as needed.     albuterol (VENTOLIN HFA) 108 (90 Base) MCG/ACT  inhaler Inhale 2 puffs into the lungs every 4 (four) hours as needed for wheezing or shortness of breath. 1 each 0   ASPIRIN 81 PO Take 81 mg by mouth daily.     benzonatate (TESSALON) 100 MG capsule Take 1 capsule (100 mg total) by mouth 3 (three) times daily as needed for cough. 21 capsule 0   Chlorphen-PE-Acetaminophen (NOREL AD) 4-10-325 MG TABS Take by mouth.     ciclopirox (PENLAC) 8 % solution Apply topically at bedtime. Apply over nail and surrounding skin. Apply daily over previous coat. After seven (7) days, may remove with alcohol and continue cycle. 6.6 mL 0   docusate sodium (COLACE) 100 MG capsule Take 200 mg by mouth daily.     famotidine (PEPCID) 40 MG tablet Take 40 mg by mouth as needed.     fluticasone (FLONASE) 50 MCG/ACT nasal spray Place into both nostrils daily as needed for allergies or rhinitis.     losartan-hydrochlorothiazide (HYZAAR) 100-25 MG tablet Take 1 tablet by mouth daily. (Patient taking differently: Take 0.5 tablets by mouth daily. TAKES 1/2 TABLET DAILY) 90 tablet 3   montelukast (SINGULAIR) 10 MG tablet      Multiple Vitamin (MULTIVITAMIN) tablet Take 1 tablet by mouth daily.     Polyethylene Glycol 3350 (MIRALAX PO) Take by mouth as needed.     pseudoephedrine (SUDAFED) 120 MG 12 hr tablet Take 120 mg by mouth 2 (two) times daily.     rosuvastatin (CRESTOR) 20 MG tablet Take 1 tablet (20 mg total) by mouth  at bedtime. 90 tablet 3   Semaglutide-Weight Management (WEGOVY) 0.5 MG/0.5ML SOAJ Inject 0.5 mg into the skin once a week. 2 mL 0   No current facility-administered medications for this visit.    PHYSICAL EXAMINATION: ECOG PERFORMANCE STATUS: 0 - Asymptomatic  LABORATORY DATA:  I have reviewed the data as listed    Latest Ref Rng & Units 03/01/2023    2:27 PM 02/22/2023   12:25 PM 11/26/2022    2:14 PM  CMP  Glucose 70 - 99 mg/dL 91  87  86   BUN 8 - 23 mg/dL 14  17  26    Creatinine 0.44 - 1.00 mg/dL 1.19  1.47  8.29   Sodium 135 - 145 mmol/L  138  143  138   Potassium 3.5 - 5.1 mmol/L 3.8  4.1  4.1   Chloride 98 - 111 mmol/L 101  102  101   CO2 22 - 32 mmol/L 30  25  31    Calcium 8.9 - 10.3 mg/dL 56.2  13.0  9.7   Total Protein 6.5 - 8.1 g/dL 7.5   7.2   Total Bilirubin 0.3 - 1.2 mg/dL 0.6   0.9   Alkaline Phos 38 - 126 U/L 64   76   AST 15 - 41 U/L 29   30   ALT 0 - 44 U/L 17   19     Lab Results  Component Value Date   WBC 4.5 03/01/2023   HGB 13.6 03/01/2023   HCT 41.7 03/01/2023   MCV 89.5 03/01/2023   PLT 176 03/01/2023   NEUTROABS 2.7 03/01/2023     I discussed the assessment and treatment plan with the patient. The patient was provided an opportunity to ask questions and all were answered. The patient agreed with the plan and demonstrated an understanding of the instructions. The patient was advised to call back or seek an in-person evaluation if the symptoms worsen or if the condition fails to improve as anticipated.    I spent 20 minutes for the appointment reviewing test results, discuss management and coordination of care.  Artis Delay, MD 03/10/2023 12:11 PM

## 2023-03-10 NOTE — Assessment & Plan Note (Signed)
She has intermittent elevated serum creatinine likely due to dehydration I encouraged the patient to increase oral fluid intake

## 2023-03-10 NOTE — Assessment & Plan Note (Signed)
She has lost a lot of weight since our last visit She will continue weight loss medication and dietary modification

## 2023-03-10 NOTE — Assessment & Plan Note (Signed)
I have reviewed results of her recent myeloma panel Her M spike was detected back in September 2023 but recent repeat blood work came back undetectable again Her last treatment was in April 2023 Overall, she is doing remission I plan to repeat labs and follow-up again in 3 months If she remain in remission after 2 years, I will space out her future appointment

## 2023-03-17 ENCOUNTER — Ambulatory Visit (INDEPENDENT_AMBULATORY_CARE_PROVIDER_SITE_OTHER): Payer: Medicare PPO | Admitting: Adult Health

## 2023-03-17 ENCOUNTER — Other Ambulatory Visit (INDEPENDENT_AMBULATORY_CARE_PROVIDER_SITE_OTHER): Payer: Self-pay | Admitting: Adult Health

## 2023-03-17 ENCOUNTER — Ambulatory Visit: Payer: Medicare PPO | Admitting: Podiatry

## 2023-03-17 ENCOUNTER — Encounter (INDEPENDENT_AMBULATORY_CARE_PROVIDER_SITE_OTHER): Payer: Self-pay | Admitting: Adult Health

## 2023-03-17 VITALS — BP 103/68 | HR 57 | Temp 97.9°F | Ht 68.0 in | Wt 253.0 lb

## 2023-03-17 DIAGNOSIS — R7303 Prediabetes: Secondary | ICD-10-CM | POA: Diagnosis not present

## 2023-03-17 DIAGNOSIS — E669 Obesity, unspecified: Secondary | ICD-10-CM

## 2023-03-17 DIAGNOSIS — Z6841 Body Mass Index (BMI) 40.0 and over, adult: Secondary | ICD-10-CM

## 2023-03-17 DIAGNOSIS — I519 Heart disease, unspecified: Secondary | ICD-10-CM | POA: Diagnosis not present

## 2023-03-17 MED ORDER — WEGOVY 0.5 MG/0.5ML ~~LOC~~ SOAJ: 0.5000 mg | SUBCUTANEOUS | 0 refills | Status: DC

## 2023-03-17 NOTE — Progress Notes (Signed)
WEIGHT SUMMARY AND BIOMETRICS  No data recorded Anthropometric Measurements Height: 5\' 8"  (1.727 m) Weight at Last Visit: 271lb Starting Weight: 268lb   No data recorded Other Clinical Data Fasting: no Labs: no Today's Visit #: 14lb Starting Date: 01/07/22    Chief Complaint:   OBESITY Lilas is here to discuss her progress with her obesity treatment plan. She is on the the Category 2 Plan and states she is following her eating plan approximately 50 % of the time.  She states she is exercising NEAT Activities.   Interim History:  She has had 3 doses of Wegovy 0.5mg  once weekly injection. Denies mass in neck, dysphagia, dyspepsia, persistent hoarseness, abdominal pain, or N/V/C   Hunger/appetite-she reports stable appetite  Her brother has been with her for 4 weeks- since his home was destroyed with [facility]. He is wheelchair bound and requires almost total care. He will move to [facility] tomorrow.  Ms. Hickox will travel to [facility] for a long awaited vacation!  Subjective:   1. Prediabetes Lab Results  Component Value Date   HGBA1C 5.9 (H) 01/07/2022   HGBA1C 5.6 11/27/2019     2. Heart disease She started on Wegovy loading dose 0.25mg  on/about 12/29/2022 02/17/2023 Wegovy increased to 0.5mg  once weekly injection. She has had 3 doses. Denies mass in neck, dysphagia, dyspepsia, persistent hoarseness, abdominal pain, or N/V/C    Assessment/Plan:   1. Prediabetes Continue Cat 2 meal plan, regular exercise and weekly GLP- 1 therapy  2. Heart disease Refill  Semaglutide-Weight Management (WEGOVY) 0.5 MG/0.5ML SOAJ Inject 0.5 mg into the skin once a week. Dispense: 2 mL, Refills: 0 ordered    3. Obesity, current BMI 40.3  Semaglutide-Weight Management (WEGOVY) 0.5 MG/0.5ML SOAJ Inject 0.5 mg into the skin once a week. Dispense: 2 mL, Refills: 0 ordered   Renny is currently in the action stage of change. As such, her  goal is to continue with weight loss efforts. She has agreed to the Category 2 Plan.   Exercise goals: Older adults should follow the adult guidelines. When older adults cannot meet the adult guidelines, they should be as physically active as their abilities and conditions will allow.  Older adults should do exercises that maintain or improve balance if they are at risk of falling.  Older adults should determine their level of effort for physical activity relative to their level of fitness.  Older adults with chronic conditions should understand whether and how their conditions affect their ability to do regular physical activity safely.  Behavioral modification strategies: increasing lean protein intake, decreasing simple carbohydrates, increasing vegetables, increasing water intake, meal planning and cooking strategies, keeping healthy foods in the home, ways to avoid boredom eating, and planning for success.  Makelle has agreed to follow-up with our clinic in 4 weeks. She was informed of the importance of frequent follow-up visits to maximize her success with intensive lifestyle modifications for her multiple health conditions.   Objective:   Height 5\' 8"  (1.727 m). Body mass index is 38.77 kg/m.  General: Cooperative, alert, well developed, in no acute distress. HEENT: Conjunctivae and lids unremarkable. Cardiovascular: Regular rhythm.  Lungs: Normal work of breathing. Neurologic: No focal deficits.   Lab Results  Component Value Date   CREATININE 1.05 (H) 03/01/2023   BUN 14 03/01/2023   NA 138 03/01/2023   K 3.8 03/01/2023   CL 101 03/01/2023   CO2 30 03/01/2023   Lab Results  Component  Value Date   ALT 17 03/01/2023   AST 29 03/01/2023   ALKPHOS 64 03/01/2023   BILITOT 0.6 03/01/2023   Lab Results  Component Value Date   HGBA1C 5.9 (H) 01/07/2022   HGBA1C 5.6 11/27/2019   Lab Results  Component Value Date   INSULIN 6.7 01/07/2022   INSULIN 9.2 11/27/2019   Lab  Results  Component Value Date   TSH 1.480 01/07/2022   Lab Results  Component Value Date   CHOL 159 01/07/2022   HDL 72 01/07/2022   LDLCALC 75 01/07/2022   TRIG 59 01/07/2022   CHOLHDL 2.2 01/07/2022   Lab Results  Component Value Date   VD25OH 75.5 01/07/2022   VD25OH 89.78 07/18/2020   VD25OH 68.6 11/27/2019   Lab Results  Component Value Date   WBC 4.5 03/01/2023   HGB 13.6 03/01/2023   HCT 41.7 03/01/2023   MCV 89.5 03/01/2023   PLT 176 03/01/2023   Lab Results  Component Value Date   IRON 108 11/27/2019   TIBC 250 11/27/2019   FERRITIN 99 11/27/2019   Attestation Statements:   Reviewed by clinician on day of visit: allergies, medications, problem list, medical history, surgical history, family history, social history, and previous encounter notes.  I have reviewed the above documentation for accuracy and completeness, and I agree with the above. -  Shaquilla Kehres d. Shamila Lerch, NP-C

## 2023-03-23 ENCOUNTER — Encounter (INDEPENDENT_AMBULATORY_CARE_PROVIDER_SITE_OTHER): Payer: Self-pay | Admitting: Adult Health

## 2023-03-23 NOTE — Telephone Encounter (Signed)
Please advise 

## 2023-03-28 DIAGNOSIS — M170 Bilateral primary osteoarthritis of knee: Secondary | ICD-10-CM | POA: Diagnosis not present

## 2023-04-08 ENCOUNTER — Ambulatory Visit: Payer: Medicare PPO | Admitting: Podiatry

## 2023-04-08 DIAGNOSIS — M79675 Pain in left toe(s): Secondary | ICD-10-CM

## 2023-04-08 DIAGNOSIS — M79674 Pain in right toe(s): Secondary | ICD-10-CM | POA: Diagnosis not present

## 2023-04-08 DIAGNOSIS — B351 Tinea unguium: Secondary | ICD-10-CM

## 2023-04-08 NOTE — Progress Notes (Signed)
  Subjective:  Patient ID: Carolyn Newman, female    DOB: 03/17/1951,  MRN: [id number]  No chief complaint on file.   72 y.o. female presents with concern for difficulty trimming nails due to thickening.  Also concerned about discoloration and abnormal growth.  Does have a history of chemotherapy for cancer.  Thinks that the changes in the nail started around when she got on chemo.  Past Medical History:  Diagnosis Date   Alopecia    Anemia    Anosmia    Back pain    Bilateral knee pain    Cancer (HCC)    myeloma   Chewing difficulty    Constipation    Elevated serum creatinine    Hyperlipidemia    Hypertension    Joint pain    Leukopenia    Multiple myeloma (HCC)    Neuropathy    Right and left feet    Obesity    SOB (shortness of breath)    Thoracic aortic aneurysm without rupture (HCC)     Allergies  Allergen Reactions   Revlimid [Lenalidomide] Rash    ROS: Negative except as per HPI above  Objective:  General: AAO x3, NAD  Dermatological: Dark discoloration thickening dystrophy and subungual debris present all nails but most pronounced on the second toe bilateral foot  Vascular:  Dorsalis Pedis artery and Posterior Tibial artery pedal pulses are 2/4 bilateral.  Capillary fill time < 3 sec to all digits.   Neruologic: Grossly via light touch bilateral  Musculoskeletal: No gross boney pedal deformities bilateral. No pain, crepitus, or limitation noted with foot and ankle range of motion bilateral. Muscular strength 5/5 in all groups tested bilateral.  Gait: Unassisted, Nonantalgic.   No images are attached to the encounter.   Assessment:   1. Pain due to onychomycosis of toenails of both feet   2. Onychomycosis       Plan:  Patient was evaluated and treated and all questions answered.  #Onychomycosis with pain  -Discussed that her nails are thickened and difficult to trim due to nail fungal infection -Discussed treatment options in detail with the  patient.  She wants to try topical antifungal which I would recommend. -eRx for Penlac 8% topical solution applied daily to all nails for the next 3 to 6 months -Nails palliatively debrided as below. -Educated on self-care  Procedure: Nail Debridement Rationale: Pain Type of Debridement: manual, sharp debridement. Instrumentation: Nail nipper, rotary burr. Number of Nails: 10 Return in about 3 months (around 07/09/2023) for RFC.          Corinna Gab, DPM [facility]

## 2023-04-12 ENCOUNTER — Ambulatory Visit (INDEPENDENT_AMBULATORY_CARE_PROVIDER_SITE_OTHER): Payer: Medicare PPO | Admitting: Adult Health

## 2023-04-12 ENCOUNTER — Encounter (INDEPENDENT_AMBULATORY_CARE_PROVIDER_SITE_OTHER): Payer: Self-pay | Admitting: Adult Health

## 2023-04-12 VITALS — BP 111/70 | HR 68 | Temp 97.4°F | Ht 68.0 in | Wt 250.0 lb

## 2023-04-12 DIAGNOSIS — I519 Heart disease, unspecified: Secondary | ICD-10-CM

## 2023-04-12 DIAGNOSIS — E669 Obesity, unspecified: Secondary | ICD-10-CM | POA: Diagnosis not present

## 2023-04-12 DIAGNOSIS — Z6838 Body mass index (BMI) 38.0-38.9, adult: Secondary | ICD-10-CM | POA: Diagnosis not present

## 2023-04-12 DIAGNOSIS — E66813 Obesity, class 3: Secondary | ICD-10-CM

## 2023-04-12 DIAGNOSIS — R7303 Prediabetes: Secondary | ICD-10-CM | POA: Diagnosis not present

## 2023-04-12 DIAGNOSIS — E559 Vitamin D deficiency, unspecified: Secondary | ICD-10-CM

## 2023-04-12 MED ORDER — WEGOVY 0.5 MG/0.5ML ~~LOC~~ SOAJ: 0.5000 mg | SUBCUTANEOUS | 0 refills | Status: DC

## 2023-04-12 NOTE — Progress Notes (Signed)
WEIGHT SUMMARY AND BIOMETRICS  Vitals Temp: (!) 97.4 F (36.3 C) BP: 111/70 Pulse Rate: 68 SpO2: 98 %   Anthropometric Measurements Height: 5\' 8"  (1.727 m) Weight: 250 lb (113.4 kg) BMI (Calculated): 38.02 Weight at Last Visit: 253lb Weight Lost Since Last Visit: 3lb Weight Gained Since Last Visit: 0 Starting Weight: 268lb Total Weight Loss (lbs): 15 lb (6.804 kg)   Body Composition  Body Fat %: 47.9 % Fat Mass (lbs): 120.2 lbs Muscle Mass (lbs): 124 lbs Total Body Water (lbs): 83.6 lbs Visceral Fat Rating : 16   Other Clinical Data Fasting: no Labs: no Today's Visit #: 15 Starting Date: 01/07/22    Chief Complaint:   OBESITY Carolyn Newman is here to discuss her progress with her obesity treatment plan. She is on the the Category 2 Plan and states she is following her eating plan approximately 90  % of the time. She states she is exercising NEAT Exercises.   Interim History:  Current weight 250 lbs with corresponding BMI 38.1 Interval goal to loss down to 230 lbs with corresponding BMI 35.0  She is currently on Wegovy 0.25mg  once weekly injection. She has been on the loading dose since 12/29/2022  She injects GLP-1 on Saturdays  Reviewed Bioimpedance results with pt: Muscle Mass: +0.8 lb Adipose Mass: -3.6 lbs  Subjective:   1. Prediabetes Lab Results  Component Value Date   HGBA1C 5.9 (H) 01/07/2022   HGBA1C 5.6 11/27/2019    She is currently on Wegovy 0.25mg  once weekly injection. She has been on the loading dose since 12/29/2022  2. Heart disease She is currently on Wegovy 0.25mg  once weekly injection. She has been on the loading dose since 12/29/2022 She is agreeable to increase to lowest mx dose of Wegovy 0.5mg  once weekly injection  3. Vitamin D deficiency Multiple Vitamin (MULTIVITAMIN) tablet  daily She endorses stable energy levels  Assessment/Plan:   1. Prediabetes Continue Cat 2 Meal Plan and increase daily activity  2. Heart  disease Continue Cat 2 Meal Plan and increase daily activity Continue with Wegovy, increase to 0.5mg  strength  3. Vitamin D deficiency Check Labs in near future  4. Obesity, current BMI 38.02 Refill and Increase  Semaglutide-Weight Management (WEGOVY) 0.5 MG/0.5ML SOAJ Inject 0.5 mg into the skin once a week. Dispense: 2 mL, Refills: 0 ordered  Carolyn Newman is currently in the action stage of change. As such, her goal is to continue with weight loss efforts. She has agreed to the Category 2 Plan.   Exercise goals: Older adults should follow the adult guidelines. When older adults cannot meet the adult guidelines, they should be as physically active as their abilities and conditions will allow.  Older adults should do exercises that maintain or improve balance if they are at risk of falling.  Older adults should determine their level of effort for physical activity relative to their level of fitness.  Older adults with chronic conditions should understand whether and how their conditions affect their ability to do regular physical activity safely.  Behavioral modification strategies: increasing lean protein intake, decreasing simple carbohydrates, increasing vegetables, increasing water intake, no skipping meals, meal planning and cooking strategies, keeping healthy foods in the home, holiday eating strategies , and planning for success.  Carolyn Newman has agreed to follow-up with our clinic in 4 weeks. She was informed of the importance of frequent follow-up visits to maximize her success with intensive lifestyle modifications for her multiple health conditions.   Objective:  Blood pressure 111/70, pulse 68, temperature (!) 97.4 F (36.3 C), height 5\' 8"  (1.727 m), weight 250 lb (113.4 kg), SpO2 98%. Body mass index is 38.01 kg/m.  General: Cooperative, alert, well developed, in no acute distress. HEENT: Conjunctivae and lids unremarkable. Cardiovascular: Regular rhythm.  Lungs: Normal work of  breathing. Neurologic: No focal deficits.   Lab Results  Component Value Date   CREATININE 1.05 (H) 03/01/2023   BUN 14 03/01/2023   NA 138 03/01/2023   K 3.8 03/01/2023   CL 101 03/01/2023   CO2 30 03/01/2023   Lab Results  Component Value Date   ALT 17 03/01/2023   AST 29 03/01/2023   ALKPHOS 64 03/01/2023   BILITOT 0.6 03/01/2023   Lab Results  Component Value Date   HGBA1C 5.9 (H) 01/07/2022   HGBA1C 5.6 11/27/2019   Lab Results  Component Value Date   INSULIN 6.7 01/07/2022   INSULIN 9.2 11/27/2019   Lab Results  Component Value Date   TSH 1.480 01/07/2022   Lab Results  Component Value Date   CHOL 159 01/07/2022   HDL 72 01/07/2022   LDLCALC 75 01/07/2022   TRIG 59 01/07/2022   CHOLHDL 2.2 01/07/2022   Lab Results  Component Value Date   VD25OH 75.5 01/07/2022   VD25OH 89.78 07/18/2020   VD25OH 68.6 11/27/2019   Lab Results  Component Value Date   WBC 4.5 03/01/2023   HGB 13.6 03/01/2023   HCT 41.7 03/01/2023   MCV 89.5 03/01/2023   PLT 176 03/01/2023   Lab Results  Component Value Date   IRON 108 11/27/2019   TIBC 250 11/27/2019   FERRITIN 99 11/27/2019   Attestation Statements:   Reviewed by clinician on day of visit: allergies, medications, problem list, medical history, surgical history, family history, social history, and previous encounter notes.  I have reviewed the above documentation for accuracy and completeness, and I agree with the above. -  Carolyn Wehrli d. Reana Chacko, NP-C

## 2023-04-13 ENCOUNTER — Ambulatory Visit (INDEPENDENT_AMBULATORY_CARE_PROVIDER_SITE_OTHER): Payer: Medicare PPO | Admitting: Adult Health

## 2023-04-28 DIAGNOSIS — M170 Bilateral primary osteoarthritis of knee: Secondary | ICD-10-CM | POA: Diagnosis not present

## 2023-05-01 ENCOUNTER — Encounter: Payer: Self-pay | Admitting: Hematology and Oncology

## 2023-05-01 ENCOUNTER — Encounter: Payer: Self-pay | Admitting: Cardiovascular Disease

## 2023-05-01 ENCOUNTER — Encounter (INDEPENDENT_AMBULATORY_CARE_PROVIDER_SITE_OTHER): Payer: Self-pay | Admitting: Adult Health

## 2023-05-02 ENCOUNTER — Encounter (INDEPENDENT_AMBULATORY_CARE_PROVIDER_SITE_OTHER): Payer: Self-pay | Admitting: Adult Health

## 2023-05-06 DIAGNOSIS — M65331 Trigger finger, right middle finger: Secondary | ICD-10-CM | POA: Diagnosis not present

## 2023-05-06 DIAGNOSIS — M65341 Trigger finger, right ring finger: Secondary | ICD-10-CM | POA: Diagnosis not present

## 2023-05-09 DIAGNOSIS — Z1231 Encounter for screening mammogram for malignant neoplasm of breast: Secondary | ICD-10-CM | POA: Diagnosis not present

## 2023-05-16 ENCOUNTER — Ambulatory Visit (INDEPENDENT_AMBULATORY_CARE_PROVIDER_SITE_OTHER): Payer: Medicare PPO | Admitting: Adult Health

## 2023-05-16 ENCOUNTER — Encounter (INDEPENDENT_AMBULATORY_CARE_PROVIDER_SITE_OTHER): Payer: Self-pay | Admitting: Adult Health

## 2023-05-16 VITALS — BP 124/75 | HR 74 | Temp 97.6°F | Ht 68.0 in | Wt 244.0 lb

## 2023-05-16 DIAGNOSIS — E669 Obesity, unspecified: Secondary | ICD-10-CM | POA: Diagnosis not present

## 2023-05-16 DIAGNOSIS — I519 Heart disease, unspecified: Secondary | ICD-10-CM

## 2023-05-16 DIAGNOSIS — Z6837 Body mass index (BMI) 37.0-37.9, adult: Secondary | ICD-10-CM | POA: Diagnosis not present

## 2023-05-16 DIAGNOSIS — R7303 Prediabetes: Secondary | ICD-10-CM

## 2023-05-16 DIAGNOSIS — Z6841 Body Mass Index (BMI) 40.0 and over, adult: Secondary | ICD-10-CM

## 2023-05-16 DIAGNOSIS — E66813 Obesity, class 3: Secondary | ICD-10-CM

## 2023-05-16 MED ORDER — WEGOVY 0.5 MG/0.5ML ~~LOC~~ SOAJ: 0.5000 mg | SUBCUTANEOUS | 0 refills | Status: DC

## 2023-05-16 NOTE — Progress Notes (Signed)
WEIGHT SUMMARY AND BIOMETRICS  Vitals Temp: 97.6 F (36.4 C) BP: 124/75 Pulse Rate: 74 SpO2: 94 %   Anthropometric Measurements Height: 5\' 8"  (1.727 m) Weight: 244 lb (110.7 kg) BMI (Calculated): 37.11 Weight at Last Visit: 250 lb Starting Weight: 268 lb Total Weight Loss (lbs): 21 lb (9.526 kg)   Body Composition  Body Fat %: 46.9 % Fat Mass (lbs): 114.4 lbs Muscle Mass (lbs): 123 lbs Total Body Water (lbs): 81.8 lbs Visceral Fat Rating : 15   Other Clinical Data Fasting: no Labs: no Today's Visit #: 16 Starting Date: 01/07/22    Chief Complaint:   OBESITY Carolyn Newman is here to discuss her progress with her obesity treatment plan. She is on the the Category 2 Plan and states she is following her eating plan approximately 60 % of the time.  She states she is exercising Walking and Hand Weights 30 minutes 3 times per week.   Interim History:  Carolyn Newman experienced GI upset last week, N/V/fatigue She endorses poor appetite, however was able to remain well hydrated. She denies current GI upset at present  Reviewed Bioimpedance results with pt: Muscle Mass: - 1 lb Adipose Mass: -5.8 lbs  2025 Health Goals: 1) Increase daily activity and regular exercise 2) Scale back working hours, seeing so many clients 3) Continue with weight loss efforts  Subjective:   1. Heart disease She denies CP with exertion, ie: brisk walking or strength training She would like to increase activity/regular exercise in 2025 She is   2. Prediabetes She is currently on Wegovy 0.25mg  once weekly injection. She has been on the loading dose since 12/29/2022 Wegovy increased from 0.25mg  to 0.5mg  on/about 04/12/2023 Denies mass in neck, dysphagia, dyspepsia, persistent hoarseness, abdominal pain, or N/V/or worsening Constipation   Assessment/Plan:   1. Heart disease (Primary) Continue to reduce saturated fat and regular exercise  Continue Crestor 20 mg daily, Hyzaar 100/25mg   1/2 tab daily, and Wegovy 0.5mg  weekly  2. Prediabetes Continue  Semaglutide-Weight Management (WEGOVY) 0.5 MG/0.5ML SOAJ Inject 0.5 mg into the skin once a week. Dispense: 2 mL, Refills: 0 ordered   3. Obesity, current BMI 37.11 Refill   Semaglutide-Weight Management (WEGOVY) 0.5 MG/0.5ML SOAJ Inject 0.5 mg into the skin once a week. Dispense: 2 mL, Refills: 0 ordered   Carolyn Newman is currently in the action stage of change. As such, her goal is to continue with weight loss efforts. She has agreed to the Category 2 Plan.   Exercise goals: Older adults should follow the adult guidelines. When older adults cannot meet the adult guidelines, they should be as physically active as their abilities and conditions will allow.  Older adults should do exercises that maintain or improve balance if they are at risk of falling.  Older adults should determine their level of effort for physical activity relative to their level of fitness.  Older adults with chronic conditions should understand whether and how their conditions affect their ability to do regular physical activity safely.  Behavioral modification strategies: increasing lean protein intake, decreasing simple carbohydrates, increasing vegetables, increasing water intake, no skipping meals, meal planning and cooking strategies, keeping healthy foods in the home, ways to avoid boredom eating, and planning for success.  Carolyn Newman has agreed to follow-up with our clinic in 4 weeks. She was informed of the importance of frequent follow-up visits to maximize her success with intensive lifestyle modifications for her multiple health conditions.   Check Fasting Labs Winter 2025  Objective:  Blood pressure 124/75, pulse 74, temperature 97.6 F (36.4 C), height 5\' 8"  (1.727 m), weight 244 lb (110.7 kg), SpO2 94%. Body mass index is 37.1 kg/m.  General: Cooperative, alert, well developed, in no acute distress. HEENT: Conjunctivae and lids  unremarkable. Cardiovascular: Regular rhythm.  Lungs: Normal work of breathing. Neurologic: No focal deficits.   Lab Results  Component Value Date   CREATININE 1.05 (H) 03/01/2023   BUN 14 03/01/2023   NA 138 03/01/2023   K 3.8 03/01/2023   CL 101 03/01/2023   CO2 30 03/01/2023   Lab Results  Component Value Date   ALT 17 03/01/2023   AST 29 03/01/2023   ALKPHOS 64 03/01/2023   BILITOT 0.6 03/01/2023   Lab Results  Component Value Date   HGBA1C 5.9 (H) 01/07/2022   HGBA1C 5.6 11/27/2019   Lab Results  Component Value Date   INSULIN 6.7 01/07/2022   INSULIN 9.2 11/27/2019   Lab Results  Component Value Date   TSH 1.480 01/07/2022   Lab Results  Component Value Date   CHOL 159 01/07/2022   HDL 72 01/07/2022   LDLCALC 75 01/07/2022   TRIG 59 01/07/2022   CHOLHDL 2.2 01/07/2022   Lab Results  Component Value Date   VD25OH 75.5 01/07/2022   VD25OH 89.78 07/18/2020   VD25OH 68.6 11/27/2019   Lab Results  Component Value Date   WBC 4.5 03/01/2023   HGB 13.6 03/01/2023   HCT 41.7 03/01/2023   MCV 89.5 03/01/2023   PLT 176 03/01/2023   Lab Results  Component Value Date   IRON 108 11/27/2019   TIBC 250 11/27/2019   FERRITIN 99 11/27/2019   Attestation Statements:   Reviewed by clinician on day of visit: allergies, medications, problem list, medical history, surgical history, family history, social history, and previous encounter notes.  I have reviewed the above documentation for accuracy and completeness, and I agree with the above. -  Tiera Mensinger d. China Deitrick, NP-C

## 2023-05-31 ENCOUNTER — Inpatient Hospital Stay: Payer: Medicare PPO | Attending: Hematology and Oncology

## 2023-05-31 DIAGNOSIS — C9001 Multiple myeloma in remission: Secondary | ICD-10-CM | POA: Diagnosis not present

## 2023-05-31 LAB — COMPREHENSIVE METABOLIC PANEL
ALT: 11 U/L (ref 0–44)
AST: 19 U/L (ref 15–41)
Albumin: 4.2 g/dL (ref 3.5–5.0)
Alkaline Phosphatase: 62 U/L (ref 38–126)
Anion gap: 6 (ref 5–15)
BUN: 17 mg/dL (ref 8–23)
CO2: 31 mmol/L (ref 22–32)
Calcium: 9.9 mg/dL (ref 8.9–10.3)
Chloride: 101 mmol/L (ref 98–111)
Creatinine, Ser: 1.05 mg/dL — ABNORMAL HIGH (ref 0.44–1.00)
GFR, Estimated: 57 mL/min — ABNORMAL LOW (ref >60)
Glucose, Bld: 90 mg/dL (ref 70–99)
Potassium: 3.6 mmol/L (ref 3.5–5.1)
Sodium: 138 mmol/L (ref 135–145)
Total Bilirubin: 0.8 mg/dL (ref 0.0–1.2)
Total Protein: 7.3 g/dL (ref 6.5–8.1)

## 2023-05-31 LAB — CBC WITH DIFFERENTIAL/PLATELET
Abs Immature Granulocytes: 0.01 10*3/uL (ref 0.00–0.07)
Basophils Absolute: 0 10*3/uL (ref 0.0–0.1)
Basophils Relative: 1 %
Eosinophils Absolute: 0.1 10*3/uL (ref 0.0–0.5)
Eosinophils Relative: 1 %
HCT: 40.4 % (ref 36.0–46.0)
Hemoglobin: 13.4 g/dL (ref 12.0–15.0)
Immature Granulocytes: 0 %
Lymphocytes Relative: 24 %
Lymphs Abs: 1.2 10*3/uL (ref 0.7–4.0)
MCH: 29.3 pg (ref 26.0–34.0)
MCHC: 33.2 g/dL (ref 30.0–36.0)
MCV: 88.4 fL (ref 80.0–100.0)
Monocytes Absolute: 0.4 10*3/uL (ref 0.1–1.0)
Monocytes Relative: 8 %
Neutro Abs: 3.2 10*3/uL (ref 1.7–7.7)
Neutrophils Relative %: 66 %
Platelets: 175 10*3/uL (ref 150–400)
RBC: 4.57 MIL/uL (ref 3.87–5.11)
RDW: 14.5 % (ref 11.5–15.5)
WBC: 4.9 10*3/uL (ref 4.0–10.5)
nRBC: 0 % (ref 0.0–0.2)

## 2023-06-01 LAB — KAPPA/LAMBDA LIGHT CHAINS
Kappa free light chain: 19.4 mg/L (ref 3.3–19.4)
Kappa, lambda light chain ratio: 1.18 (ref 0.26–1.65)
Lambda free light chains: 16.5 mg/L (ref 5.7–26.3)

## 2023-06-02 DIAGNOSIS — M170 Bilateral primary osteoarthritis of knee: Secondary | ICD-10-CM | POA: Diagnosis not present

## 2023-06-07 LAB — MULTIPLE MYELOMA PANEL, SERUM
Albumin SerPl Elph-Mcnc: 3.6 g/dL (ref 2.9–4.4)
Albumin/Glob SerPl: 1.3 (ref 0.7–1.7)
Alpha 1: 0.2 g/dL (ref 0.0–0.4)
Alpha2 Glob SerPl Elph-Mcnc: 0.9 g/dL (ref 0.4–1.0)
B-Globulin SerPl Elph-Mcnc: 0.9 g/dL (ref 0.7–1.3)
Gamma Glob SerPl Elph-Mcnc: 0.8 g/dL (ref 0.4–1.8)
Globulin, Total: 2.8 g/dL (ref 2.2–3.9)
IgA: 144 mg/dL (ref 64–422)
IgG (Immunoglobin G), Serum: 946 mg/dL (ref 586–1602)
IgM (Immunoglobulin M), Srm: 74 mg/dL (ref 26–217)
Total Protein ELP: 6.4 g/dL (ref 6.0–8.5)

## 2023-06-10 ENCOUNTER — Inpatient Hospital Stay (HOSPITAL_BASED_OUTPATIENT_CLINIC_OR_DEPARTMENT_OTHER): Payer: Medicare PPO | Admitting: Hematology and Oncology

## 2023-06-10 ENCOUNTER — Telehealth: Payer: Self-pay | Admitting: Hematology and Oncology

## 2023-06-10 ENCOUNTER — Encounter: Payer: Self-pay | Admitting: Hematology and Oncology

## 2023-06-10 DIAGNOSIS — Z6839 Body mass index (BMI) 39.0-39.9, adult: Secondary | ICD-10-CM

## 2023-06-10 DIAGNOSIS — H524 Presbyopia: Secondary | ICD-10-CM | POA: Diagnosis not present

## 2023-06-10 DIAGNOSIS — H43393 Other vitreous opacities, bilateral: Secondary | ICD-10-CM | POA: Diagnosis not present

## 2023-06-10 DIAGNOSIS — E66812 Obesity, class 2: Secondary | ICD-10-CM | POA: Diagnosis not present

## 2023-06-10 DIAGNOSIS — C9001 Multiple myeloma in remission: Secondary | ICD-10-CM

## 2023-06-10 DIAGNOSIS — H04123 Dry eye syndrome of bilateral lacrimal glands: Secondary | ICD-10-CM | POA: Diagnosis not present

## 2023-06-10 NOTE — Assessment & Plan Note (Signed)
I have reviewed results of her recent myeloma panel Her M spike was detected back in September 2023 but recent repeat blood work came back undetectable again Her last treatment was in April 2023 Overall, she is doing remission I plan to repeat labs and follow-up again in 3 months If she remain in remission after 2 years by April 2025, I will space out her future appointment

## 2023-06-10 NOTE — Telephone Encounter (Signed)
Spoke with patient confirming upcoming appointment

## 2023-06-10 NOTE — Progress Notes (Signed)
HEMATOLOGY-ONCOLOGY ELECTRONIC VISIT PROGRESS NOTE  Patient Care Team: Renaye Rakers, MD as PCP - General (Family Medicine) Runell Gess, MD as PCP - Cardiology (Cardiology) Flo Shanks, MD as Consulting Physician (Otolaryngology) Artis Delay, MD as Consulting Physician (Hematology and Oncology)  I connected with the patient via telephone conference and verified that I am speaking with the correct person using two identifiers. The patient's location is at home and I am providing care from the [facility] I discussed the limitations, risks, security and privacy concerns of performing an evaluation and management service by e-visits and the availability of in person appointments.  I also discussed with the patient that there may be a patient responsible charge related to this service. The patient expressed understanding and agreed to proceed.   ASSESSMENT & PLAN:  Multiple myeloma in remission [facility]) I have reviewed results of her recent myeloma panel Her M spike was detected back in September 2023 but recent repeat blood work came back undetectable again Her last treatment was in April 2023 Overall, she is doing remission I plan to repeat labs and follow-up again in 3 months If she remain in remission after 2 years by April 2025, I will space out her future appointment  Class 2 obesity due to excess calories with body mass index (BMI) of 39.0 to 39.9 in adult She has lost a lot of weight since our last visit She will continue weight loss medication and dietary modification  No orders of the defined types were placed in this encounter.   INTERVAL HISTORY: Please see below for problem oriented charting. The purpose of today's discussion is review test results She is doing well Denies recent infection We reviewed recent myeloma panel and plan of care She is doing very well She is continuing on her weight loss journey and continues to have successful weight loss  SUMMARY  OF ONCOLOGIC HISTORY: Oncology History  Multiple myeloma in remission (HCC)  03/22/2016 Bone Marrow Biopsy   Bone Marrow Biopsy: Plasma cells 17% by Aspirate and 30% by CD 138 stain. Plasma cell neoplasm, Kappa Restricted Normal cytogenetics, FISH positive for +11 and +14 and +7   04/13/2016 Imaging   Skeletal survey showed lucencies within the calvarium worrisome for myeloma. No definite abnormal lytic or blastic lesions are observed elsewhere.   04/15/2016 Cancer Staging   Staging form: Multiple Myeloma, AJCC 6th Edition - Clinical stage from 04/15/2016: Stage IIA - Signed by Artis Delay, MD on 07/31/2021 Staged by: Managing physician Diagnostic confirmation: Positive histology Specimen type: Biopsy / Limited Resection Histopathologic type: Neoplasm, malignant   04/19/2016 - 09/10/2016 Chemotherapy   The patient consented to treatment with Revlimid, dexamethasone and Velcade   09/14/2016 Bone Marrow Biopsy   In summary, there is a normal cellular marrow with trilineage hematopoiesis.  A mild plasmacytosis is present, but there is no definitive evidence of residual multiple myeloma   10/12/2016 - 10/12/2016 Chemotherapy   She received melphalan as conditioning chemo   10/13/2016 Bone Marrow Transplant   She received autologous stem cell transplant at [facility]   01/24/2017 Bone Marrow Biopsy   BONE MARROW: -          Normocellular marrow for age (50%) with trilineage hematopoiesis. -     No morphologic or immunohistochemical evidence of involvement by plasma cell neoplasm (see comment)  PERIPHERAL BLOOD: -          Unremarkable (see CBC data)  COMMENT: The bone marrow is normocellular for age and shows  adequate trilineage hematopoiesis without significant (<10%) dysplastic changes present. Blasts are not increased. Plasma cells represent about 2% of total cells on aspirate smears. Appropriately controlled immunohistochemical stains are performed on the core biopsy. CD138 highlights  small interstitial plasma cells comprising approximately 2% of total cells. These plasma cells appear polytypic as demonstrated by kappa and lambda in-situ hybridization.   01/25/2017 PET scan   No FDG avid osseous lesions or masses are identified.   02/18/2017 - 04/01/2017 Chemotherapy   She received weekly Velcade, Pomalyst and Dexamethasone   04/15/2017 - 08/28/2021 Chemotherapy   The patient had Pomalyst only   04/20/2017 Bone Marrow Biopsy   Bone Marrow (BM) and Peripheral Blood (PB) FINAL PATHOLOGIC DIAGNOSIS BONE MARROW: Normocellular bone marrow (40%) with normal numbers of megakaryocytes, erythroid hyperplasia and no increase in plasma cells (1%).   10/20/2021 Imaging   Bone density is normal     REVIEW OF SYSTEMS:   Constitutional: Denies fevers, chills or abnormal weight loss Eyes: Denies blurriness of vision Ears, nose, mouth, throat, and face: Denies mucositis or sore throat Respiratory: Denies cough, dyspnea or wheezes Cardiovascular: Denies palpitation, chest discomfort Gastrointestinal:  Denies nausea, heartburn or change in bowel habits Skin: Denies abnormal skin rashes Lymphatics: Denies new lymphadenopathy or easy bruising Neurological:Denies numbness, tingling or new weaknesses Behavioral/Psych: Mood is stable, no new changes  Extremities: No lower extremity edema All other systems were reviewed with the patient and are negative.  I have reviewed the past medical history, past surgical history, social history and family history with the patient and they are unchanged from previous note.  ALLERGIES:  is allergic to revlimid [lenalidomide].  MEDICATIONS:  Current Outpatient Medications  Medication Sig Dispense Refill   acetaminophen (TYLENOL) 500 MG tablet Take 500 mg by mouth every 6 (six) hours as needed.     albuterol (VENTOLIN HFA) 108 (90 Base) MCG/ACT inhaler Inhale 2 puffs into the lungs every 4 (four) hours as needed for wheezing or shortness of breath.  1 each 0   ASPIRIN 81 PO Take 81 mg by mouth daily.     benzonatate (TESSALON) 100 MG capsule Take 1 capsule (100 mg total) by mouth 3 (three) times daily as needed for cough. 21 capsule 0   Chlorphen-PE-Acetaminophen (NOREL AD) 4-10-325 MG TABS Take by mouth.     ciclopirox (PENLAC) 8 % solution Apply topically at bedtime. Apply over nail and surrounding skin. Apply daily over previous coat. After seven (7) days, may remove with alcohol and continue cycle. 6.6 mL 0   docusate sodium (COLACE) 100 MG capsule Take 200 mg by mouth daily.     famotidine (PEPCID) 40 MG tablet Take 40 mg by mouth as needed.     fluticasone (FLONASE) 50 MCG/ACT nasal spray Place into both nostrils daily as needed for allergies or rhinitis.     losartan-hydrochlorothiazide (HYZAAR) 100-25 MG tablet Take 1 tablet by mouth daily. (Patient taking differently: Take 0.5 tablets by mouth daily. TAKES 1/2 TABLET DAILY) 90 tablet 3   montelukast (SINGULAIR) 10 MG tablet      Multiple Vitamin (MULTIVITAMIN) tablet Take 1 tablet by mouth daily.     Polyethylene Glycol 3350 (MIRALAX PO) Take by mouth as needed.     pseudoephedrine (SUDAFED) 120 MG 12 hr tablet Take 120 mg by mouth 2 (two) times daily.     rosuvastatin (CRESTOR) 20 MG tablet Take 1 tablet (20 mg total) by mouth at bedtime. 90 tablet 3   Semaglutide-Weight Management (WEGOVY) 0.5 MG/0.5ML  SOAJ Inject 0.5 mg into the skin once a week. 2 mL 0   No current facility-administered medications for this visit.    PHYSICAL EXAMINATION: ECOG PERFORMANCE STATUS: 0 - Asymptomatic  LABORATORY DATA:  I have reviewed the data as listed    Latest Ref Rng & Units 05/31/2023    9:19 AM 03/01/2023    2:27 PM 02/22/2023   12:25 PM  CMP  Glucose 70 - 99 mg/dL 90  91  87   BUN 8 - 23 mg/dL 17  14  17    Creatinine 0.44 - 1.00 mg/dL 4.09  8.11  9.14   Sodium 135 - 145 mmol/L 138  138  143   Potassium 3.5 - 5.1 mmol/L 3.6  3.8  4.1   Chloride 98 - 111 mmol/L 101  101  102   CO2  22 - 32 mmol/L 31  30  25    Calcium 8.9 - 10.3 mg/dL 9.9  78.2  95.6   Total Protein 6.5 - 8.1 g/dL 7.3  7.5    Total Bilirubin 0.0 - 1.2 mg/dL 0.8  0.6    Alkaline Phos 38 - 126 U/L 62  64    AST 15 - 41 U/L 19  29    ALT 0 - 44 U/L 11  17      Lab Results  Component Value Date   WBC 4.9 05/31/2023   HGB 13.4 05/31/2023   HCT 40.4 05/31/2023   MCV 88.4 05/31/2023   PLT 175 05/31/2023   NEUTROABS 3.2 05/31/2023     I discussed the assessment and treatment plan with the patient. The patient was provided an opportunity to ask questions and all were answered. The patient agreed with the plan and demonstrated an understanding of the instructions. The patient was advised to call back or seek an in-person evaluation if the symptoms worsen or if the condition fails to improve as anticipated.    I spent 25 minutes for the appointment reviewing test results, discuss management and coordination of care.  Artis Delay, MD 06/10/2023 1:12 PM

## 2023-06-10 NOTE — Assessment & Plan Note (Signed)
She has lost a lot of weight since our last visit She will continue weight loss medication and dietary modification

## 2023-06-14 ENCOUNTER — Encounter (INDEPENDENT_AMBULATORY_CARE_PROVIDER_SITE_OTHER): Payer: Self-pay | Admitting: Adult Health

## 2023-06-14 ENCOUNTER — Ambulatory Visit (INDEPENDENT_AMBULATORY_CARE_PROVIDER_SITE_OTHER): Payer: Medicare PPO | Admitting: Adult Health

## 2023-06-14 VITALS — BP 117/81 | HR 57 | Temp 97.8°F | Ht 68.0 in | Wt 242.0 lb

## 2023-06-14 DIAGNOSIS — Z6836 Body mass index (BMI) 36.0-36.9, adult: Secondary | ICD-10-CM

## 2023-06-14 DIAGNOSIS — N1831 Chronic kidney disease, stage 3a: Secondary | ICD-10-CM | POA: Diagnosis not present

## 2023-06-14 DIAGNOSIS — E559 Vitamin D deficiency, unspecified: Secondary | ICD-10-CM | POA: Diagnosis not present

## 2023-06-14 DIAGNOSIS — I519 Heart disease, unspecified: Secondary | ICD-10-CM

## 2023-06-14 DIAGNOSIS — R7303 Prediabetes: Secondary | ICD-10-CM | POA: Diagnosis not present

## 2023-06-14 DIAGNOSIS — E66813 Obesity, class 3: Secondary | ICD-10-CM

## 2023-06-14 DIAGNOSIS — E669 Obesity, unspecified: Secondary | ICD-10-CM

## 2023-06-14 MED ORDER — WEGOVY 0.5 MG/0.5ML ~~LOC~~ SOAJ: 0.5000 mg | SUBCUTANEOUS | 0 refills | Status: DC

## 2023-06-14 NOTE — Progress Notes (Signed)
WEIGHT SUMMARY AND BIOMETRICS  Vitals Temp: 97.8 F (36.6 C) BP: 117/81 Pulse Rate: (!) 57 SpO2: 99 %   Anthropometric Measurements Height: 5\' 8"  (1.727 m) Weight: 242 lb (109.8 kg) BMI (Calculated): 36.8 Weight at Last Visit: 244lb Weight Lost Since Last Visit: 2lb Weight Gained Since Last Visit: 0 Starting Weight: 268lb Total Weight Loss (lbs): 23 lb (10.4 kg)   Body Composition  Body Fat %: 48.2 % Fat Mass (lbs): 116.8 lbs Muscle Mass (lbs): 119 lbs Total Body Water (lbs): 82.2 lbs Visceral Fat Rating : 16   Other Clinical Data Fasting: no Labs: no Today's Visit #: 17 Starting Date: 01/07/22    Chief Complaint:   OBESITY Carolyn Newman is here to discuss her progress with her obesity treatment plan. She is on the the Category 2 Plan and states she is following her eating plan approximately 80 % of the time.  She states she is exercising Walking and Strength Training 15 minutes 2 times per week.   Interim History:  Started on Wegovy 0.25mg  on/about 12/29/2022 Wegovy increased from 0.25mg  to 0.5mg  on/about 04/12/2023  She reports improved bowel habits and will have BM every other day now.  Exercise-walking, light strength training, and weekly "Stretch Zone" sessions. Hydration-she has been increasing plain water intake/daily  She provided the following food recall that is typical of a day: Breakfast: Protein Shake, greek yogurt with fruit Lunch: Fruit Dinner: Meat protein with vegetables  Reviewed Bioimpedance results with pt: Muscle Mass: - 4 lbs Adipose Mass: + 2.4 lbs  Subjective:   1. Chronic kidney disease, stage 3a [facility]) Discussed Labs  Latest Reference Range & Units 11/26/22 14:14 02/22/23 12:25 03/01/23 14:27 05/31/23 09:19  Creatinine 0.44 - 1.00 mg/dL 1.61 (H) 0.96 0.45 (H) 1.05 (H)  (H): Data is abnormally high  Latest Reference Range & Units 08/30/22 14:10 11/26/22 14:14 03/01/23 14:27 05/31/23 09:19  GFR, Estimated >60 mL/min 58 (L)  57 (L) 57 (L) 57 (L)  (L): Data is abnormally low  Kidney fx stable She is on daily Hyzaar 100/25mg  BP well controlled and at goal at OV today.  2. Heart disease She is followed by Dr. Lestine Mount She is on daily Crestor 20mg - denies myalgias  3. Prediabetes Started on Wegovy 0.25mg  on/about 12/29/2022 Wegovy increased from 0.25mg  to 0.5mg  on/about 04/12/2023 She injects on Saturday. She is unsure of cost of GLP-1 therapy in 2025.  4. Vitamin D deficiency  Latest Reference Range & Units 01/07/22 10:59  Vitamin D, 25-Hydroxy 30.0 - 100.0 ng/mL 75.5   She is on daily OTC Multivitamin  Assessment/Plan:   1. Chronic kidney disease, stage 3a (HCC) Keep BP well controlled Remain well hydrated Avoid Nephrotoxic substances  2. Heart disease (Primary) Check Labs at next OV  3. Prediabetes Check Labs at next OV  4. Vitamin D deficiency Check Labs at next OV  5. Obesity, current BMI 36.8 Refill Semaglutide-Weight Management (WEGOVY) 0.5 MG/0.5ML SOAJ Inject 0.5 mg into the skin once a week. Dispense: 2 mL, Refills: 0 ordered   Allanah is currently in the action stage of change. As such, her goal is to continue with weight loss efforts. She has agreed to the Category 2 Plan.   Exercise goals: Older adults should follow the adult guidelines. When older adults cannot meet the adult guidelines, they should be as physically active as their abilities and conditions will allow.  Older adults should do exercises that maintain or improve balance if they are at risk of  falling.  Older adults should determine their level of effort for physical activity relative to their level of fitness.  Older adults with chronic conditions should understand whether and how their conditions affect their ability to do regular physical activity safely.  Behavioral modification strategies: increasing lean protein intake, decreasing simple carbohydrates, increasing vegetables, increasing water intake, no  skipping meals, meal planning and cooking strategies, keeping healthy foods in the home, ways to avoid boredom eating, and planning for success.  Zayra has agreed to follow-up with our clinic in 4 weeks. She was informed of the importance of frequent follow-up visits to maximize her success with intensive lifestyle modifications for her multiple health conditions.   Check Fasting Labs at next OV  Objective:   Blood pressure 117/81, pulse (!) 57, temperature 97.8 F (36.6 C), height 5\' 8"  (1.727 m), weight 242 lb (109.8 kg), SpO2 99%. Body mass index is 36.8 kg/m.  General: Cooperative, alert, well developed, in no acute distress. HEENT: Conjunctivae and lids unremarkable. Cardiovascular: Regular rhythm.  Lungs: Normal work of breathing. Neurologic: No focal deficits.   Lab Results  Component Value Date   CREATININE 1.05 (H) 05/31/2023   BUN 17 05/31/2023   NA 138 05/31/2023   K 3.6 05/31/2023   CL 101 05/31/2023   CO2 31 05/31/2023   Lab Results  Component Value Date   ALT 11 05/31/2023   AST 19 05/31/2023   ALKPHOS 62 05/31/2023   BILITOT 0.8 05/31/2023   Lab Results  Component Value Date   HGBA1C 5.9 (H) 01/07/2022   HGBA1C 5.6 11/27/2019   Lab Results  Component Value Date   INSULIN 6.7 01/07/2022   INSULIN 9.2 11/27/2019   Lab Results  Component Value Date   TSH 1.480 01/07/2022   Lab Results  Component Value Date   CHOL 159 01/07/2022   HDL 72 01/07/2022   LDLCALC 75 01/07/2022   TRIG 59 01/07/2022   CHOLHDL 2.2 01/07/2022   Lab Results  Component Value Date   VD25OH 75.5 01/07/2022   VD25OH 89.78 07/18/2020   VD25OH 68.6 11/27/2019   Lab Results  Component Value Date   WBC 4.9 05/31/2023   HGB 13.4 05/31/2023   HCT 40.4 05/31/2023   MCV 88.4 05/31/2023   PLT 175 05/31/2023   Lab Results  Component Value Date   IRON 108 11/27/2019   TIBC 250 11/27/2019   FERRITIN 99 11/27/2019   Attestation Statements:   Reviewed by clinician on  day of visit: allergies, medications, problem list, medical history, surgical history, family history, social history, and previous encounter notes.  I have reviewed the above documentation for accuracy and completeness, and I agree with the above. -  Dorthy Magnussen d. Kolston Lacount, NP-C

## 2023-06-30 DIAGNOSIS — M170 Bilateral primary osteoarthritis of knee: Secondary | ICD-10-CM | POA: Diagnosis not present

## 2023-07-12 ENCOUNTER — Ambulatory Visit: Payer: Medicare PPO | Admitting: Podiatry

## 2023-07-14 ENCOUNTER — Ambulatory Visit: Payer: Medicare PPO | Admitting: Podiatry

## 2023-07-15 ENCOUNTER — Ambulatory Visit
Admission: EM | Admit: 2023-07-15 | Discharge: 2023-07-15 | Disposition: A | Discharge status: Home / Self Care | Payer: Medicare PPO | Attending: Family Medicine | Admitting: Family Medicine

## 2023-07-15 ENCOUNTER — Encounter: Payer: Self-pay | Admitting: Emergency Medicine

## 2023-07-15 ENCOUNTER — Ambulatory Visit (INDEPENDENT_AMBULATORY_CARE_PROVIDER_SITE_OTHER): Payer: Medicare PPO

## 2023-07-15 DIAGNOSIS — R051 Acute cough: Secondary | ICD-10-CM | POA: Diagnosis not present

## 2023-07-15 DIAGNOSIS — R058 Other specified cough: Secondary | ICD-10-CM | POA: Diagnosis not present

## 2023-07-15 DIAGNOSIS — J0100 Acute maxillary sinusitis, unspecified: Secondary | ICD-10-CM

## 2023-07-15 MED ORDER — CEFDINIR 300 MG PO CAPS: 300.0000 mg | ORAL_CAPSULE | Freq: Two times a day (BID) | ORAL | 0 refills | Status: DC

## 2023-07-15 MED ORDER — PREDNISONE 20 MG PO TABS: 40.0000 mg | ORAL_TABLET | Freq: Every day | ORAL | 0 refills | Status: AC

## 2023-07-15 MED ORDER — BENZONATATE 100 MG PO CAPS: 100.0000 mg | ORAL_CAPSULE | Freq: Three times a day (TID) | ORAL | 0 refills | Status: DC | PRN

## 2023-07-15 MED ORDER — ALBUTEROL SULFATE HFA 108 (90 BASE) MCG/ACT IN AERS: 2.0000 | INHALATION_SPRAY | RESPIRATORY_TRACT | 0 refills | Status: DC | PRN

## 2023-07-15 NOTE — ED Triage Notes (Signed)
 Pt reports nasal & chest congestion with productive cough and runny nose x5 days. Pt reports the sinus congestion has been going on since beginning of February but things got worse and moved into her chest this last week. Pt reports robitussin use at home with little relief. Reports current remission of multiple myeloma.

## 2023-07-15 NOTE — Discharge Instructions (Signed)
 You were seen today for upper respiratory symptoms.  I have diagnosed a sinus infection.  I have sent out an antibiotic for this.  Your chest xray appears normal, but if the radiologist reads this differently we will notify you.  I have sent out an inhaler, steroid, and cough pill for this.  If you are not improving please return for re-evaluation.

## 2023-07-15 NOTE — ED Provider Notes (Signed)
 [name]    CSN: [id number] Arrival date & time: 07/15/23  0904      History   Chief Complaint Chief Complaint  Patient presents with   Nasal Congestion   Cough    HPI Carolyn Newman is a 73 y.o. female.    Cough Associated symptoms: chills and rhinorrhea   Associated symptoms: no fever    Patient is here for URI sympjtoms.  She has had sinus congestion for 4 weeks, recently worsening, with it moving into her chest with cough and congestion.  She gets soreness/pain in her chest with the coughing.  Some chills, no fevers.  She feels that she is breathing heavy today.   Using otc medications without much help.       Past Medical History:  Diagnosis Date   Alopecia    Anemia    Anosmia    Back pain    Bilateral knee pain    Cancer (HCC)    myeloma   Chewing difficulty    Constipation    Elevated serum creatinine    Hyperlipidemia    Hypertension    Joint pain    Leukopenia    Multiple myeloma (HCC)    Neuropathy    Right and left feet    Obesity    SOB (shortness of breath)    Thoracic aortic aneurysm without rupture [facility])     Patient Active Problem List   Diagnosis Date Noted   Chronic kidney disease, stage 3a (HCC) 01/20/2022   SOB (shortness of breath) on exertion 01/07/2022   Vitamin D deficiency 01/07/2022   Essential hypertension 01/07/2022   Other fatigue 07/25/2020   Prediabetes 04/13/2020   Visceral obesity 04/13/2020   Metabolic syndrome 04/13/2020   Preventive measure 01/25/2020   Leukopenia due to antineoplastic chemotherapy (HCC) 01/11/2019   Other constipation 01/11/2019   Hyperlipidemia 06/23/2018   Thoracic aortic aneurysm without rupture (HCC) 05/19/2018   Anosmia 11/15/2017   Class 2 obesity due to excess calories with body mass index (BMI) of 39.0 to 39.9 in adult 08/18/2017   Hypotension due to drugs 04/15/2017   Sinus congestion 03/25/2017   S/P autologous bone marrow transplantation (HCC) 02/01/2017    Peripheral neuropathy due to chemotherapy (HCC) 07/13/2016   Physical debility 07/13/2016   Allergic rhinitis 07/02/2016   Rash due to allergy 07/02/2016   Elevated serum creatinine 05/22/2016   Financial difficulty 05/22/2016   Pancytopenia, acquired (HCC) 04/19/2016   Multiple myeloma in remission (HCC) 04/04/2016   Normocytic anemia 03/09/2016    Past Surgical History:  Procedure Laterality Date   CATARACT EXTRACTION     EYE SURGERY     INJECTION KNEE     LIMBAL STEM CELL TRANSPLANT      OB History     Gravida  0   Para  0   Term  0   Preterm  0   AB  0   Living  0      SAB  0   IAB  0   Ectopic  0   Multiple  0   Live Births  0            Home Medications    Prior to Admission medications   Medication Sig Start Date End Date Taking? Authorizing Provider  acetaminophen (TYLENOL) 500 MG tablet Take 500 mg by mouth every 6 (six) hours as needed.   Yes [provider]  ASPIRIN 81 PO Take 81 mg by mouth daily.  Yes [provider]  benzonatate (TESSALON) 100 MG capsule Take 1 capsule (100 mg total) by mouth 3 (three) times daily as needed for cough. 07/27/22  Yes Zenia Resides, MD  docusate sodium (COLACE) 100 MG capsule Take 200 mg by mouth daily.   Yes [provider]  famotidine (PEPCID) 40 MG tablet Take 40 mg by mouth as needed.   Yes [provider]  losartan-hydrochlorothiazide (HYZAAR) 100-25 MG tablet Take 1 tablet by mouth daily. Patient taking differently: Take 0.5 tablets by mouth daily. TAKES 1/2 TABLET DAILY 04/26/16  Yes Gorsuch, Paula Compton, MD  Multiple Vitamin (MULTIVITAMIN) tablet Take 1 tablet by mouth daily.   Yes [provider]  Polyethylene Glycol 3350 (MIRALAX PO) Take by mouth as needed.   Yes [provider]  rosuvastatin (CRESTOR) 20 MG tablet Take 1 tablet (20 mg total) by mouth at bedtime. 02/22/23 07/15/23 Yes Runell Gess, MD  Semaglutide-Weight Management [facility]) 0.5  MG/0.5ML SOAJ Inject 0.5 mg into the skin once a week. 06/14/23  Yes Danford, Orpha Bur D, NP  TURMERIC PO  05/19/23  Yes [provider]  albuterol (VENTOLIN HFA) 108 (90 Base) MCG/ACT inhaler Inhale 2 puffs into the lungs every 4 (four) hours as needed for wheezing or shortness of breath. Patient not taking: Reported on 07/15/2023 07/27/22   Zenia Resides, MD  Chlorphen-PE-Acetaminophen (NOREL AD) 4-10-325 MG TABS Take by mouth. Patient not taking: Reported on 07/15/2023    [provider]  ciclopirox (PENLAC) 8 % solution Apply topically at bedtime. Apply over nail and surrounding skin. Apply daily over previous coat. After seven (7) days, may remove with alcohol and continue cycle. Patient not taking: Reported on 07/15/2023 12/17/22   Standiford, Jenelle Mages, DPM  FLUAD 0.5 ML injection Inject 0.5 mLs into the muscle once. Patient not taking: Reported on 07/15/2023 01/20/23   [provider]  fluticasone (FLONASE) 50 MCG/ACT nasal spray Place into both nostrils daily as needed for allergies or rhinitis. Patient not taking: Reported on 07/15/2023    [provider]  montelukast (SINGULAIR) 10 MG tablet  10/15/21   [provider]  pseudoephedrine (SUDAFED) 120 MG 12 hr tablet Take 120 mg by mouth 2 (two) times daily. Patient not taking: Reported on 07/15/2023    [provider]    Family History Family History  Problem Relation Age of Onset   Cancer Mother        lymphoma   Diabetes Mother    Hypertension Mother    Hyperlipidemia Mother    Depression Mother    Obesity Mother    Heart disease Mother    Cancer Father        prostate ca   Heart disease Father    Colon cancer Maternal Grandmother    Diabetes Maternal Grandfather     Social History Social History   Tobacco Use   Smoking status: Never   Smokeless tobacco: Never  Vaping Use   Vaping status: Never Used  Substance Use Topics   Alcohol use: No   Drug use: No      Allergies   Revlimid [lenalidomide]   Review of Systems Review of Systems  Constitutional:  Positive for chills. Negative for fever.  HENT:  Positive for congestion, rhinorrhea and sinus pressure.   Respiratory:  Positive for cough.   Cardiovascular: Negative.   Gastrointestinal: Negative.   Genitourinary: Negative.   Musculoskeletal: Negative.   Psychiatric/Behavioral: Negative.       Physical Exam  Triage Vital Signs ED Triage Vitals [07/15/23 0927]  Encounter Vitals Group     BP 119/80     Systolic BP Percentile      Diastolic BP Percentile      Pulse Rate 96     Resp 16     Temp 98.3 F (36.8 C)     Temp Source Oral     SpO2 96 %     Weight      Height      Head Circumference      Peak Flow      Pain Score 3     Pain Loc      Pain Education      Exclude from Growth Chart    No data found.  Updated Vital Signs BP 119/80 (BP Location: Left Arm)   Pulse 96   Temp 98.3 F (36.8 C) (Oral)   Resp 16   SpO2 96%   Visual Acuity Right Eye Distance:   Left Eye Distance:   Bilateral Distance:    Right Eye Near:   Left Eye Near:    Bilateral Near:     Physical Exam Constitutional:      General: She is not in acute distress.    Appearance: Normal appearance. She is normal weight. She is not ill-appearing or toxic-appearing.  HENT:     Nose: Congestion and rhinorrhea present.     Right Sinus: Maxillary sinus tenderness present.     Left Sinus: Maxillary sinus tenderness present.  Cardiovascular:     Rate and Rhythm: Normal rate and regular rhythm.  Pulmonary:     Effort: Pulmonary effort is normal.     Breath sounds: Normal breath sounds. No wheezing, rhonchi or rales.  Musculoskeletal:     Cervical back: Normal range of motion and neck supple. No tenderness.  Lymphadenopathy:     Cervical: No cervical adenopathy.  Skin:    General: Skin is warm.  Neurological:     General: No focal deficit present.     Mental Status: She is alert.   Psychiatric:        Mood and Affect: Mood normal.      UC Treatments / Results  Labs (all labs ordered are listed, but only abnormal results are displayed) Labs Reviewed - No data to display  EKG   Radiology No results found.  Procedures Procedures (including critical care time)  Medications Ordered in UC Medications - No data to display  Initial Impression / Assessment and Plan / UC Course  I have reviewed the triage vital signs and the nursing notes.  Pertinent labs & imaging results that were available during my care of the patient were reviewed by me and considered in my medical decision making (see chart for details).   Final Clinical Impressions(s) / UC Diagnoses   Final diagnoses:  Acute cough  Acute non-recurrent maxillary sinusitis     Discharge Instructions      You were seen today for upper respiratory symptoms.  I have diagnosed a sinus infection.  I have sent out an antibiotic for this.  Your chest xray appears normal, but if the radiologist reads this differently we will notify you.  I have sent out an inhaler, steroid, and cough pill for this.  If you are not improving please return for re-evaluation.     ED Prescriptions     Medication Sig Dispense Auth. Provider   benzonatate (TESSALON) 100 MG capsule Take 1 capsule (100 mg total) by  mouth 3 (three) times daily as needed for cough. 21 capsule Jama Mcmiller, MD   cefdinir (OMNICEF) 300 MG capsule Take 1 capsule (300 mg total) by mouth 2 (two) times daily. 20 capsule Orion Vandervort, MD   predniSONE (DELTASONE) 20 MG tablet Take 2 tablets (40 mg total) by mouth daily for 5 days. 10 tablet Tuesday Terlecki, MD   albuterol (VENTOLIN HFA) 108 (90 Base) MCG/ACT inhaler Inhale 2 puffs into the lungs every 4 (four) hours as needed for wheezing or shortness of breath. 1 each Jannifer Franklin, MD      PDMP not reviewed this encounter.   Jannifer Franklin, MD 07/15/23 1003

## 2023-07-16 IMAGING — CR DG CHEST 2V
2 series · 2 of 2 positions shown · non-contrast
Comparison: March 17, 2020

CLINICAL DATA: Cough, congestion and low-grade fever x3 days.

EXAM:
CHEST - 2 VIEW

[w chest pa]
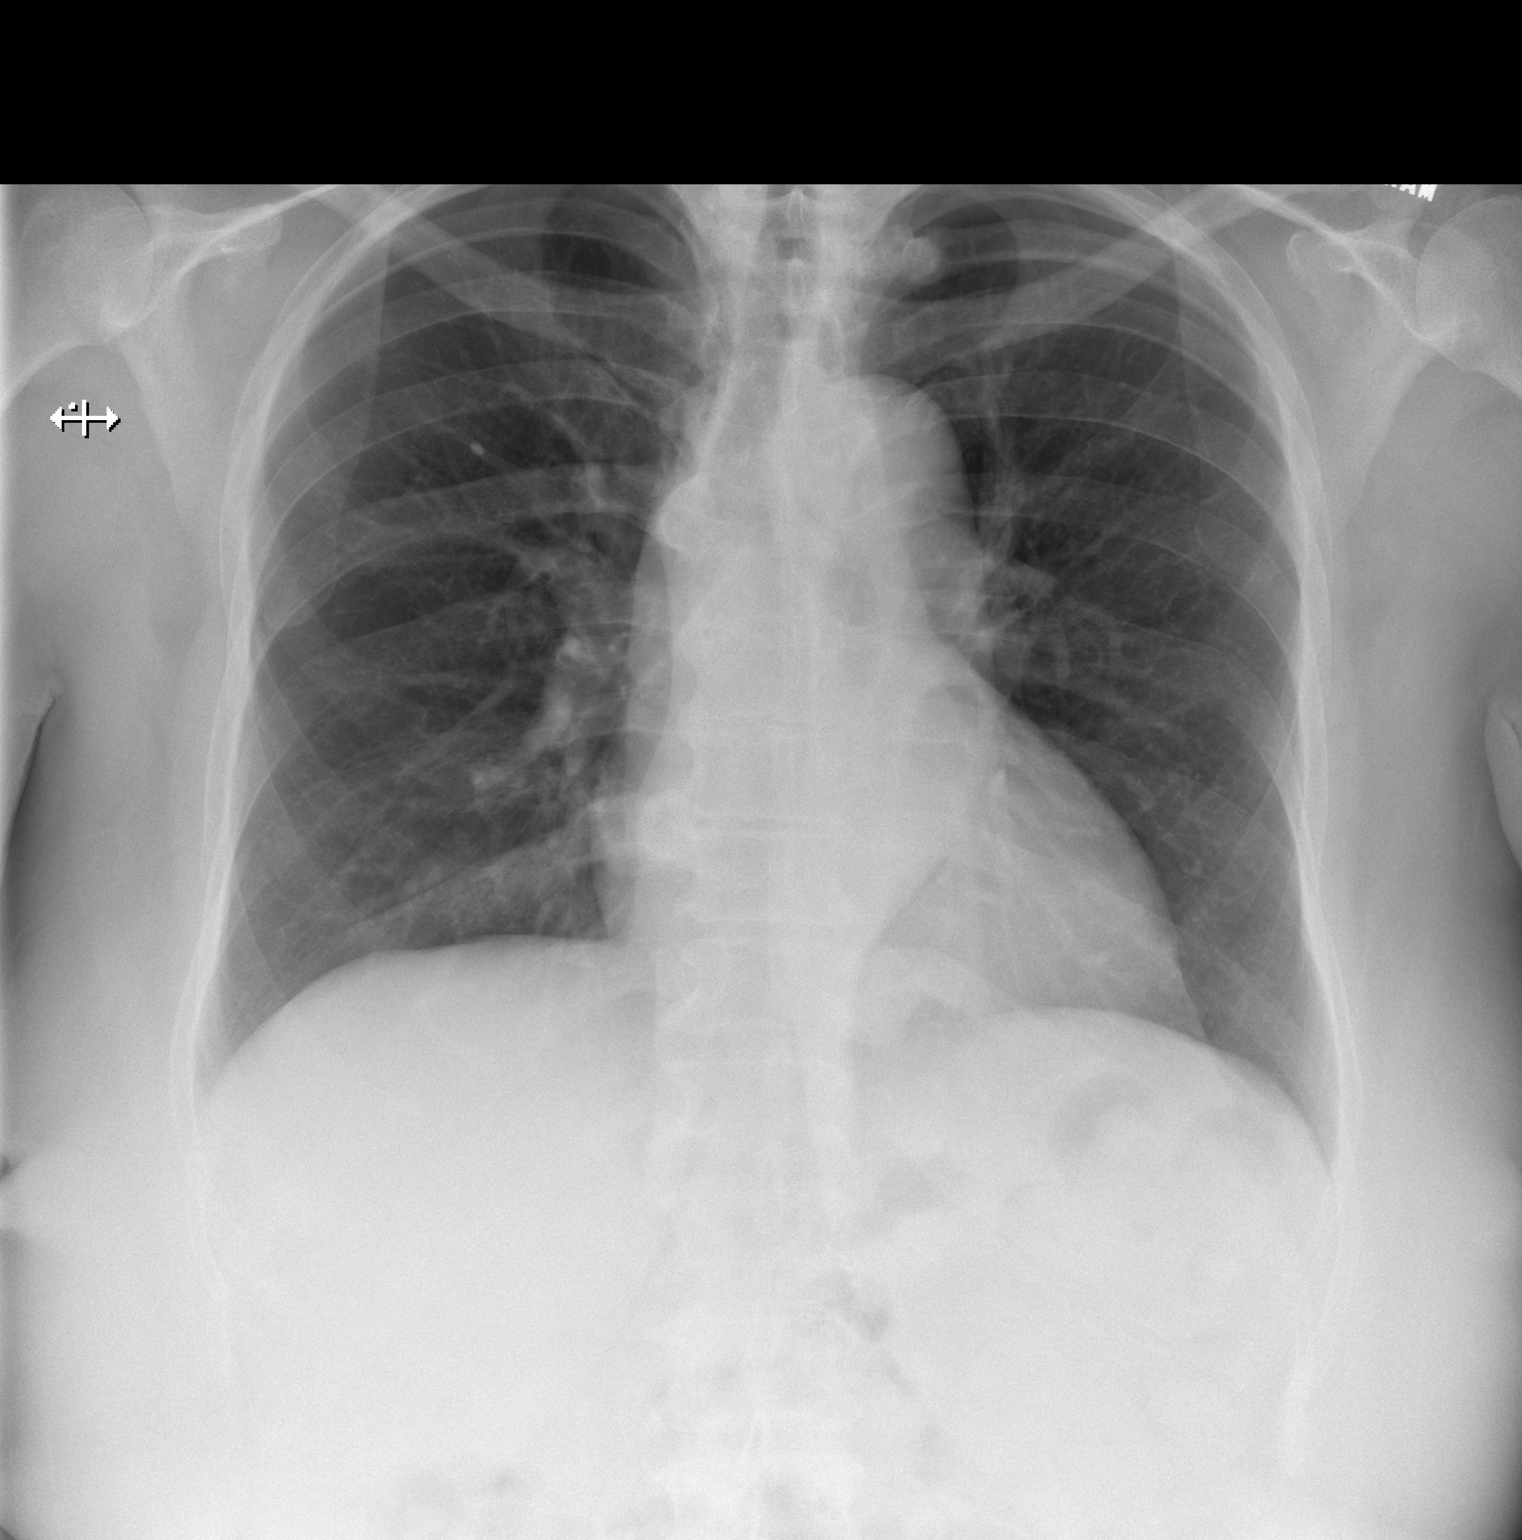

[w chest lat]
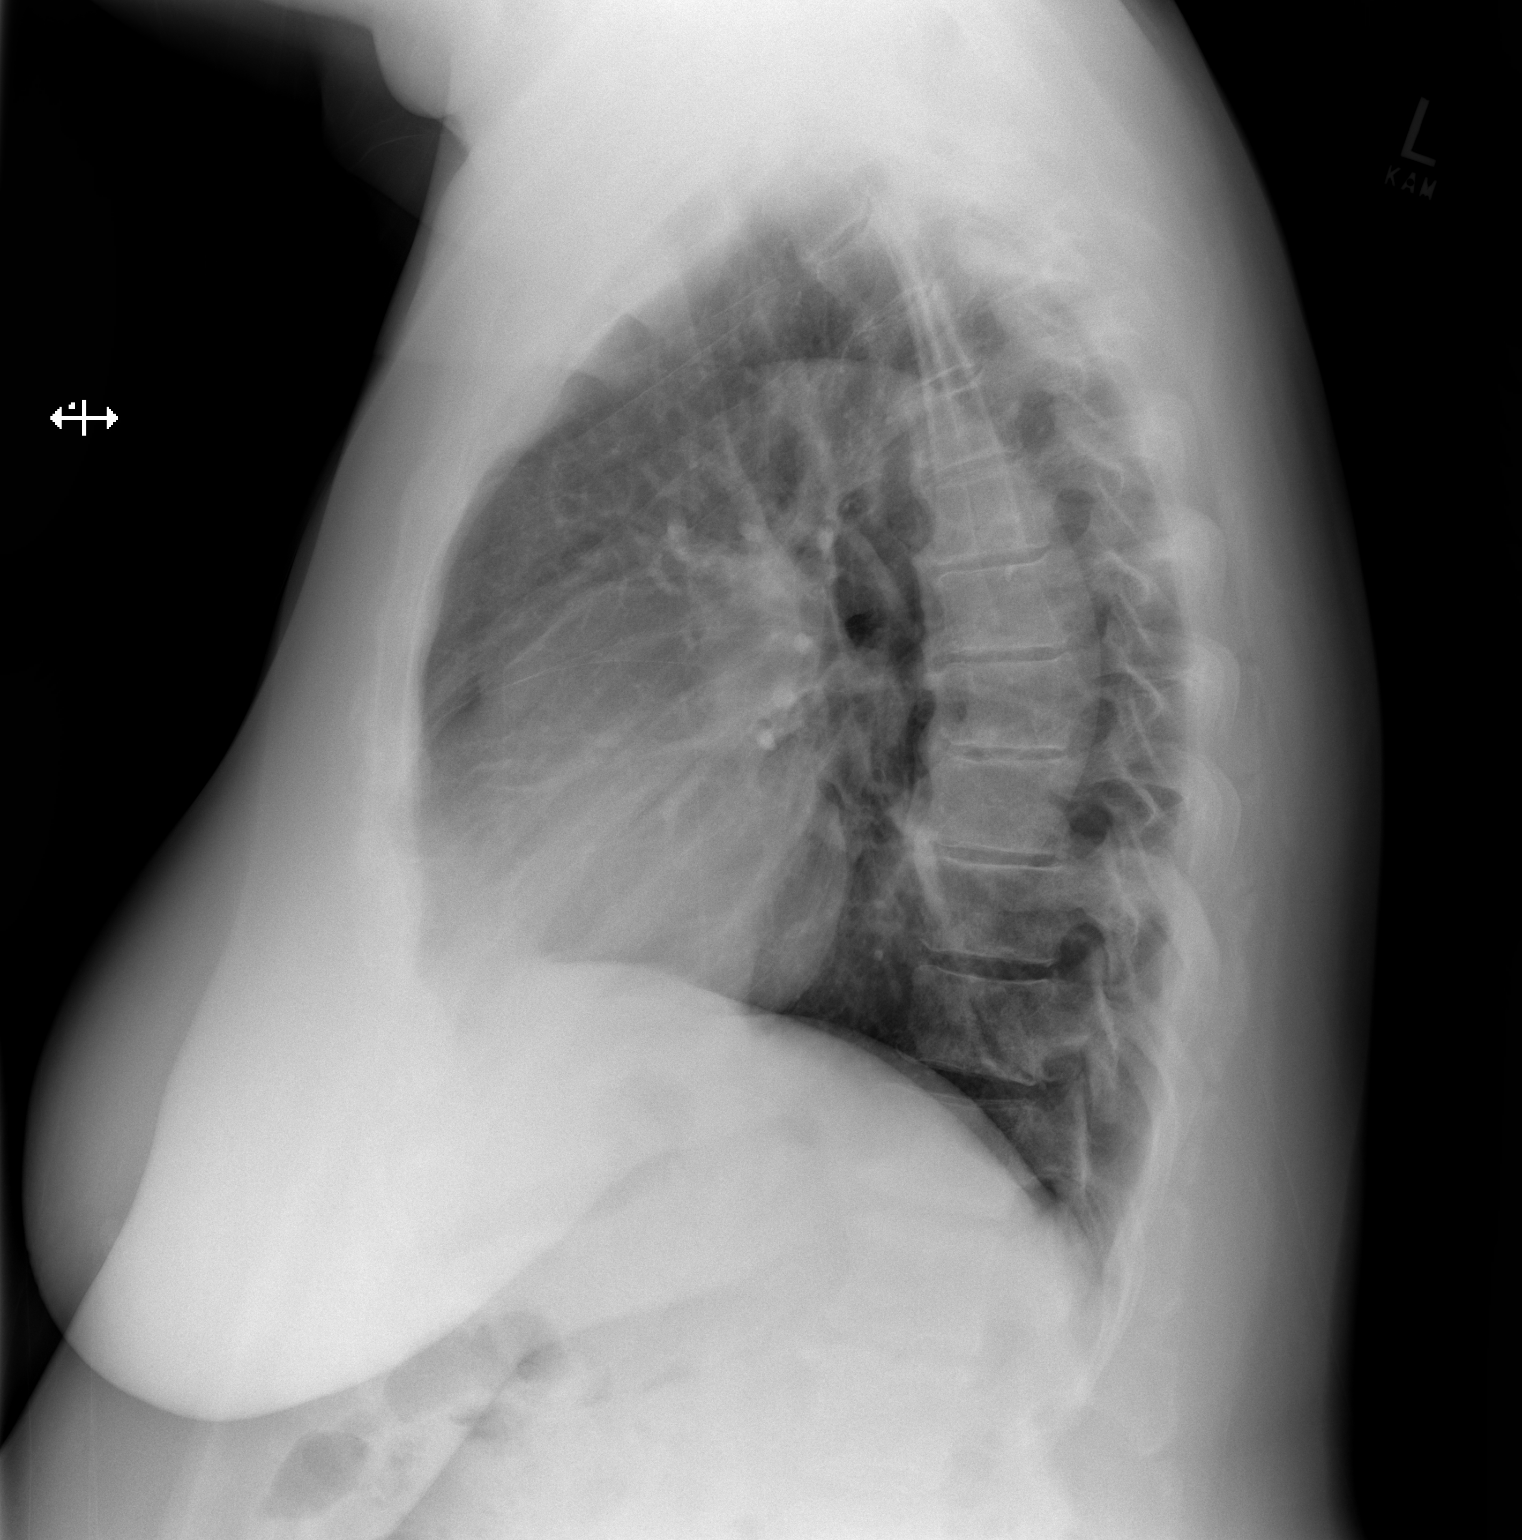

[2 of 2 positions shown; findings below may reference images not displayed]

FINDINGS: The heart size and mediastinal contours are within normal limits.
There is stable tortuosity of the descending thoracic aorta. Both
lungs are clear. The visualized skeletal structures are
unremarkable.
IMPRESSION: No active cardiopulmonary disease.

## 2023-07-18 ENCOUNTER — Ambulatory Visit (INDEPENDENT_AMBULATORY_CARE_PROVIDER_SITE_OTHER): Payer: Medicare PPO | Admitting: Adult Health

## 2023-07-18 ENCOUNTER — Encounter (INDEPENDENT_AMBULATORY_CARE_PROVIDER_SITE_OTHER): Payer: Self-pay | Admitting: Adult Health

## 2023-07-18 VITALS — BP 102/67 | HR 60 | Temp 97.6°F | Ht 68.0 in | Wt 239.0 lb

## 2023-07-18 DIAGNOSIS — I519 Heart disease, unspecified: Secondary | ICD-10-CM | POA: Diagnosis not present

## 2023-07-18 DIAGNOSIS — Z6836 Body mass index (BMI) 36.0-36.9, adult: Secondary | ICD-10-CM | POA: Diagnosis not present

## 2023-07-18 DIAGNOSIS — N1831 Chronic kidney disease, stage 3a: Secondary | ICD-10-CM | POA: Diagnosis not present

## 2023-07-18 DIAGNOSIS — E559 Vitamin D deficiency, unspecified: Secondary | ICD-10-CM | POA: Diagnosis not present

## 2023-07-18 DIAGNOSIS — E669 Obesity, unspecified: Secondary | ICD-10-CM | POA: Diagnosis not present

## 2023-07-18 DIAGNOSIS — R7303 Prediabetes: Secondary | ICD-10-CM | POA: Diagnosis not present

## 2023-07-18 MED ORDER — WEGOVY 1 MG/0.5ML ~~LOC~~ SOAJ: 1.0000 mg | SUBCUTANEOUS | 0 refills | Status: DC

## 2023-07-18 NOTE — Progress Notes (Signed)
 WEIGHT SUMMARY AND BIOMETRICS  Vitals Temp: 97.6 F (36.4 C) BP: 102/67 Pulse Rate: 60 SpO2: 100 %   Anthropometric Measurements Height: 5\' 8"  (1.727 m) Weight: 239 lb (108.4 kg) BMI (Calculated): 36.35 Weight at Last Visit: 242 lb Weight Lost Since Last Visit: 3 lb Weight Gained Since Last Visit: 0 Starting Weight: 268 lb Total Weight Loss (lbs): 26 lb (11.8 kg)   Body Composition  Body Fat %: 48.2 % Fat Mass (lbs): 115.4 lbs Muscle Mass (lbs): 117.8 lbs Total Body Water (lbs): 82.6 lbs Visceral Fat Rating : 15   Other Clinical Data Fasting: yes Labs: yes Today's Visit #: 18 Starting Date: 01/07/22    Chief Complaint:   OBESITY Carolyn Newman is here to discuss her progress with her obesity treatment plan.  She is on the the Category 2 Plan and states she is following her eating plan approximately 70 % of the time.  She states she is exercising: None- recent acute illness   Interim History:  07/15/2023 Reviewed UC Notes with pt: Patient is here for URI sympjtoms.  She has had sinus congestion for 4 weeks, recently worsening, with it moving into her chest with cough and congestion.  She gets soreness/pain in her chest with the coughing.  Some chills, no fevers.  She feels that she is breathing heavy today.   Using otc medications without much help.   Final DX:   You were seen today for upper respiratory symptoms.  I have diagnosed a sinus infection.  I have sent out an antibiotic for this.  Your chest xray appears normal, but if the radiologist reads this differently we will notify you.  I have sent out an inhaler, steroid, and cough pill for this.  If you are not improving please return for re-evaluation.   Of note- Started on Wegovy 0.25mg  on/about 12/29/2022 Wegovy increased from 0.25mg  to 0.5mg  on/about 04/12/2023 Discussed risks/benefits of increasing to 1mg - she is agreeable to increased dose   Subjective:   1. Chronic kidney disease, stage 3a  (HCC)  Latest Reference Range & Units 08/30/22 14:10 11/26/22 14:14 03/01/23 14:27 05/31/23 09:19  GFR, Estimated >60 mL/min 58 (L) 57 (L) 57 (L) 57 (L)  (L): Data is abnormally low   BP well controlled at OV She denies acute cardiac sx's at present She does endorse recent URI sx's  2. Prediabetes Started on Wegovy 0.25mg  on/about 12/29/2022 Wegovy increased from 0.25mg  to 0.5mg  on/about 04/12/2023 Discussed risks/benefits of increasing to 1mg - she is agreeable to increased dose   3. Heart disease She denies CP or dyspnea She denies edema of lower extremities History of small thoracic aortic aneurysm measuring 40 mm by CTA performed 10/06/2021.      03/02/2023 Narrative & Impression  CLINICAL DATA:  Aortic aneurysm follow-up. EXAM: CT ANGIOGRAPHY CHEST WITH CONTRAST  IMPRESSION: 1. Stable aneurysmal dilatation of the ascending aorta measuring 4 cm. Recommend annual imaging followup by CTA or MRA. This recommendation follows 2010 ACCF/AHA/AATS/ACR/ASA/SCA/SCAI/SIR/STS/SVM Guidelines for the Diagnosis and Management of Patients with Thoracic Aortic Disease. Circulation. 2010; 121: O130-Q657. Aortic aneurysm NOS (ICD10-I71.9) 2. Mild cardiomegaly.   Relevant Medications  ASPIRIN 81 PO  losartan-hydrochlorothiazide (HYZAAR) 100-25 MG tablet   4. Vitamin D deficiency  Latest Reference Range & Units 01/07/22 10:59  Vitamin D, 25-Hydroxy 30.0 - 100.0 ng/mL 75.5   She is currently on daily OTC MVI She endorses recent increase in fatigue r/t recent acute illness.  Assessment/Plan:   1. Chronic kidney disease, stage  3a (HCC) Defer CMP check to Oncologist  2. Prediabetes (Primary) Check Labs - Hemoglobin A1c - Insulin, random - Vitamin B12  3. Heart disease Continue healthy eating and when fully recovered (sinusitis) resume regular exercise Continue weekly Wegovy Therapy  4. Vitamin D deficiency Check Labs - VITAMIN D 25 Hydroxy (Vit-D Deficiency, Fractures)  5.  Obesity, current BMI 36.4 Refill and INCREASE   Semaglutide-Weight Management (WEGOVY) 1 MG/0.5ML SOAJ Inject 1 mg into the skin once a week. Dispense: 2 mL, Refills: 0 ordered   Carolyn Newman is currently in the action stage of change. As such, her goal is to continue with weight loss efforts. She has agreed to the Category 2 Plan.   Exercise goals: No exercise has been prescribed at this time.  Behavioral modification strategies: increasing lean protein intake, decreasing simple carbohydrates, increasing vegetables, increasing water intake, meal planning and cooking strategies, keeping healthy foods in the home, ways to avoid boredom eating, ways to avoid night time snacking, and planning for success.  Carolyn Newman has agreed to follow-up with our clinic in 4 weeks. She was informed of the importance of frequent follow-up visits to maximize her success with intensive lifestyle modifications for her multiple health conditions.   Carolyn Newman was informed we would discuss her lab results at her next visit unless there is a critical issue that needs to be addressed sooner. Carolyn Newman agreed to keep her next visit at the agreed upon time to discuss these results.  Objective:   Blood pressure 102/67, pulse 60, temperature 97.6 F (36.4 C), height 5\' 8"  (1.727 m), weight 239 lb (108.4 kg), SpO2 100%. Body mass index is 36.34 kg/m.  General: Cooperative, alert, well developed, in no acute distress. HEENT: Conjunctivae and lids unremarkable. Cardiovascular: Regular rhythm.  Lungs: Normal work of breathing. Neurologic: No focal deficits.   Lab Results  Component Value Date   CREATININE 1.05 (H) 05/31/2023   BUN 17 05/31/2023   NA 138 05/31/2023   K 3.6 05/31/2023   CL 101 05/31/2023   CO2 31 05/31/2023   Lab Results  Component Value Date   ALT 11 05/31/2023   AST 19 05/31/2023   ALKPHOS 62 05/31/2023   BILITOT 0.8 05/31/2023   Lab Results  Component Value Date   HGBA1C 5.9 (H) 01/07/2022   HGBA1C  5.6 11/27/2019   Lab Results  Component Value Date   INSULIN 6.7 01/07/2022   INSULIN 9.2 11/27/2019   Lab Results  Component Value Date   TSH 1.480 01/07/2022   Lab Results  Component Value Date   CHOL 159 01/07/2022   HDL 72 01/07/2022   LDLCALC 75 01/07/2022   TRIG 59 01/07/2022   CHOLHDL 2.2 01/07/2022   Lab Results  Component Value Date   VD25OH 75.5 01/07/2022   VD25OH 89.78 07/18/2020   VD25OH 68.6 11/27/2019   Lab Results  Component Value Date   WBC 4.9 05/31/2023   HGB 13.4 05/31/2023   HCT 40.4 05/31/2023   MCV 88.4 05/31/2023   PLT 175 05/31/2023   Lab Results  Component Value Date   IRON 108 11/27/2019   TIBC 250 11/27/2019   FERRITIN 99 11/27/2019   Attestation Statements:   Reviewed by clinician on day of visit: allergies, medications, problem list, medical history, surgical history, family history, social history, and previous encounter notes.  I have reviewed the above documentation for accuracy and completeness, and I agree with the above. -  Conleigh Heinlein d. Jaleal Schliep, NP-C

## 2023-07-19 LAB — VITAMIN B12: Vitamin B-12: 1217 pg/mL (ref 232–1245)

## 2023-07-19 LAB — HEMOGLOBIN A1C
Est. average glucose Bld gHb Est-mCnc: 117 mg/dL
Hgb A1c MFr Bld: 5.7 % — ABNORMAL HIGH (ref 4.8–5.6)

## 2023-07-19 LAB — INSULIN, RANDOM: INSULIN: 9.9 u[IU]/mL (ref 2.6–24.9)

## 2023-07-19 LAB — VITAMIN D 25 HYDROXY (VIT D DEFICIENCY, FRACTURES): Vit D, 25-Hydroxy: 60.2 ng/mL (ref 30.0–100.0)

## 2023-07-28 DIAGNOSIS — M170 Bilateral primary osteoarthritis of knee: Secondary | ICD-10-CM | POA: Diagnosis not present

## 2023-08-16 ENCOUNTER — Ambulatory Visit (INDEPENDENT_AMBULATORY_CARE_PROVIDER_SITE_OTHER): Admitting: Adult Health

## 2023-08-16 ENCOUNTER — Encounter (INDEPENDENT_AMBULATORY_CARE_PROVIDER_SITE_OTHER): Payer: Self-pay | Admitting: Adult Health

## 2023-08-16 VITALS — BP 109/72 | HR 74 | Temp 98.3°F | Ht 68.0 in | Wt 231.0 lb

## 2023-08-16 DIAGNOSIS — Z6836 Body mass index (BMI) 36.0-36.9, adult: Secondary | ICD-10-CM

## 2023-08-16 DIAGNOSIS — E669 Obesity, unspecified: Secondary | ICD-10-CM | POA: Diagnosis not present

## 2023-08-16 DIAGNOSIS — I519 Heart disease, unspecified: Secondary | ICD-10-CM

## 2023-08-16 DIAGNOSIS — N1831 Chronic kidney disease, stage 3a: Secondary | ICD-10-CM | POA: Diagnosis not present

## 2023-08-16 DIAGNOSIS — E785 Hyperlipidemia, unspecified: Secondary | ICD-10-CM

## 2023-08-16 DIAGNOSIS — E559 Vitamin D deficiency, unspecified: Secondary | ICD-10-CM | POA: Diagnosis not present

## 2023-08-16 DIAGNOSIS — R7303 Prediabetes: Secondary | ICD-10-CM

## 2023-08-16 MED ORDER — WEGOVY 1 MG/0.5ML ~~LOC~~ SOAJ: 1.0000 mg | SUBCUTANEOUS | 0 refills | Status: DC

## 2023-08-16 NOTE — Progress Notes (Signed)
 WEIGHT SUMMARY AND BIOMETRICS  No data recorded Anthropometric Measurements Height: 5\' 8"  (1.727 m) Weight at Last Visit: 239 lb Starting Weight: 268 lb   No data recorded Other Clinical Data Fasting: no Labs: no Today's Visit #: 19 Starting Date: 01/07/22    Chief Complaint:   OBESITY Carolyn Newman is here to discuss her progress with her obesity treatment plan.  She is on the the Category 2 Plan and states she is following her eating plan approximately 85 % of the time.  She states she is exercising: NEAT Activities   Interim History:  Started on Wegovy 0.25mg  on/about 12/29/2022 Wegovy increased from 0.25mg  to 0.5mg  on/about 04/12/2023 Wegovy increased from 0.5mg  to 1mg  on/about 07/18/2023 Denies mass in neck, dysphagia, dyspepsia, persistent hoarseness, abdominal pain, or N/V/C   Reviewed Bioimpedance results with pt: Muscle Mass: -1.2 lbs Adipose Mass: -6.6 lbs  She continues to experience nasal congestion and fatigue- unable to exercise rigorously   Subjective:   1. Prediabetes Discussed Labs  Latest Reference Range & Units 07/18/23 13:56  Hemoglobin A1C 4.8 - 5.6 % 5.7 (H)  Est. average glucose Bld gHb Est-mCnc mg/dL 629  INSULIN 2.6 - 52.8 uIU/mL 9.9  (H): Data is abnormally high  A1c improved, yet still above goal Insulin level slightly worsened and above goal Wegovy increased from 0.5mg  to 1mg  on/about 07/18/2023 Denies mass in neck, dysphagia, dyspepsia, persistent hoarseness, abdominal pain, or N/V/C   2. Vitamin D deficiency Discussed Labs  Latest Reference Range & Units 07/18/23 13:56  Vitamin D, 25-Hydroxy 30.0 - 100.0 ng/mL 60.2  Vitamin B12 232 - 1,245 pg/mL 1,217   Vit D Level stable and at goal B12 Level at goal She is on daily OTC MVI and daily OTC Calcium plus Vit D supplement  3. Heart disease Discussed Labs BP excellent at OV She is currently on ASPIRIN 81 PO  losartan-hydrochlorothiazide (HYZAAR) 100-25 MG tablet   4.  Hyperlipidemia, unspecified hyperlipidemia type Discussed Labs 05/31/2023 CMP: Electrolytes, Liver Enzymes - normal CKD stable BP at goal at OV She is on daily Crestor 20mg - denies mylagias  5. Chronic kidney disease, stage 3a [facility]) Discussed Labs  Latest Reference Range & Units 11/26/22 14:14 02/22/23 12:25 03/01/23 14:27 05/31/23 09:19  Creatinine 0.44 - 1.00 mg/dL 4.13 (H) 2.44 0.10 (H) 1.05 (H)  (H): Data is abnormally high   Latest Reference Range & Units 08/30/22 14:10 11/26/22 14:14 03/01/23 14:27 05/31/23 09:19  GFR, Estimated >60 mL/min 58 (L) 57 (L) 57 (L) 57 (L)  (L): Data is abnormally low  CKD stable BP stable at OV She is on losartan-hydrochlorothiazide (HYZAAR) 100-25 MG tablet  Assessment/Plan:   1. Prediabetes Continue healthy eating and when feeling better, resume regular exercise  2. Vitamin D deficiency (Primary) Continue current OTC supplementation  3. Heart disease Continue ASPIRIN 81 PO  losartan-hydrochlorothiazide (HYZAAR) 100-25 MG tablet   4. Hyperlipidemia, unspecified hyperlipidemia type Continue daily statin Continue healthy eating and when feeling better, resume regular exercise  5. Chronic kidney disease, stage 3a (HCC) Continue to keep BP well controlled Continue to avoid Nephrotoxic substances Monitor labs Continue   losartan-hydrochlorothiazide (HYZAAR) 100-25 MG tablet   6. Obesity, current BMI 36.4 Refill  Semaglutide-Weight Management (WEGOVY) 1 MG/0.5ML SOAJ Inject 1 mg into the skin once a week. Dispense: 2 mL, Refills: 0 ordered   Carolyn Newman is currently in the action stage of change. As such, her goal is to continue with weight loss efforts. She has agreed to the  Category 2 Plan.   Exercise goals: No exercise has been prescribed at this time.  Behavioral modification strategies: increasing lean protein intake, decreasing simple carbohydrates, increasing vegetables, increasing water intake, no skipping meals, meal planning  and cooking strategies, keeping healthy foods in the home, ways to avoid boredom eating, and planning for success.  Carolyn Newman has agreed to follow-up with our clinic in 4 weeks. She was informed of the importance of frequent follow-up visits to maximize her success with intensive lifestyle modifications for her multiple health conditions.   Objective:   Height 5\' 8"  (1.727 m). Body mass index is 36.34 kg/m.  General: Cooperative, alert, well developed, in no acute distress. HEENT: Conjunctivae and lids unremarkable. Cardiovascular: Regular rhythm.  Lungs: Normal work of breathing. Neurologic: No focal deficits.   Lab Results  Component Value Date   CREATININE 1.05 (H) 05/31/2023   BUN 17 05/31/2023   NA 138 05/31/2023   K 3.6 05/31/2023   CL 101 05/31/2023   CO2 31 05/31/2023   Lab Results  Component Value Date   ALT 11 05/31/2023   AST 19 05/31/2023   ALKPHOS 62 05/31/2023   BILITOT 0.8 05/31/2023   Lab Results  Component Value Date   HGBA1C 5.7 (H) 07/18/2023   HGBA1C 5.9 (H) 01/07/2022   HGBA1C 5.6 11/27/2019   Lab Results  Component Value Date   INSULIN 9.9 07/18/2023   INSULIN 6.7 01/07/2022   INSULIN 9.2 11/27/2019   Lab Results  Component Value Date   TSH 1.480 01/07/2022   Lab Results  Component Value Date   CHOL 159 01/07/2022   HDL 72 01/07/2022   LDLCALC 75 01/07/2022   TRIG 59 01/07/2022   CHOLHDL 2.2 01/07/2022   Lab Results  Component Value Date   VD25OH 60.2 07/18/2023   VD25OH 75.5 01/07/2022   VD25OH 89.78 07/18/2020   Lab Results  Component Value Date   WBC 4.9 05/31/2023   HGB 13.4 05/31/2023   HCT 40.4 05/31/2023   MCV 88.4 05/31/2023   PLT 175 05/31/2023   Lab Results  Component Value Date   IRON 108 11/27/2019   TIBC 250 11/27/2019   FERRITIN 99 11/27/2019   Attestation Statements:   Reviewed by clinician on day of visit: allergies, medications, problem list, medical history, surgical history, family history, social  history, and previous encounter notes.  I have reviewed the above documentation for accuracy and completeness, and I agree with the above. -  Devonne Lalani d. Eleasha Cataldo, NP-C

## 2023-08-26 ENCOUNTER — Telehealth: Payer: Self-pay

## 2023-08-26 NOTE — Progress Notes (Addendum)
   08/26/2023  Patient ID: Carolyn Newman, female   DOB: 1950/10/25, 73 y.o.   MRN: [id number]  Contacted patient regarding medication adherence from a quality report for [facility]. The patient failed [facility] in 2024.    Left patient a voicemail to return my call at their convenience.  Livia Riffle, PharmD Clinical Pharmacist  [phone]

## 2023-08-30 ENCOUNTER — Inpatient Hospital Stay: Payer: Medicare PPO | Attending: Hematology and Oncology

## 2023-08-30 DIAGNOSIS — M170 Bilateral primary osteoarthritis of knee: Secondary | ICD-10-CM | POA: Diagnosis not present

## 2023-08-30 DIAGNOSIS — C9001 Multiple myeloma in remission: Secondary | ICD-10-CM | POA: Insufficient documentation

## 2023-08-30 DIAGNOSIS — D72819 Decreased white blood cell count, unspecified: Secondary | ICD-10-CM | POA: Diagnosis not present

## 2023-08-30 LAB — COMPREHENSIVE METABOLIC PANEL WITH GFR
ALT: 13 U/L (ref 0–44)
AST: 26 U/L (ref 15–41)
Albumin: 4.3 g/dL (ref 3.5–5.0)
Alkaline Phosphatase: 50 U/L (ref 38–126)
Anion gap: 5 (ref 5–15)
BUN: 15 mg/dL (ref 8–23)
CO2: 29 mmol/L (ref 22–32)
Calcium: 9.5 mg/dL (ref 8.9–10.3)
Chloride: 103 mmol/L (ref 98–111)
Creatinine, Ser: 0.9 mg/dL (ref 0.44–1.00)
GFR, Estimated: >60 mL/min (ref >60)
Glucose, Bld: 90 mg/dL (ref 70–99)
Potassium: 3.7 mmol/L (ref 3.5–5.1)
Sodium: 137 mmol/L (ref 135–145)
Total Bilirubin: 0.7 mg/dL (ref 0.0–1.2)
Total Protein: 7.2 g/dL (ref 6.5–8.1)

## 2023-08-30 LAB — CBC WITH DIFFERENTIAL/PLATELET
Abs Immature Granulocytes: 0.01 10*3/uL (ref 0.00–0.07)
Basophils Absolute: 0 10*3/uL (ref 0.0–0.1)
Basophils Relative: 1 %
Eosinophils Absolute: 0.1 10*3/uL (ref 0.0–0.5)
Eosinophils Relative: 2 %
HCT: 39.7 % (ref 36.0–46.0)
Hemoglobin: 13 g/dL (ref 12.0–15.0)
Immature Granulocytes: 0 %
Lymphocytes Relative: 37 %
Lymphs Abs: 1.3 10*3/uL (ref 0.7–4.0)
MCH: 29 pg (ref 26.0–34.0)
MCHC: 32.7 g/dL (ref 30.0–36.0)
MCV: 88.6 fL (ref 80.0–100.0)
Monocytes Absolute: 0.4 10*3/uL (ref 0.1–1.0)
Monocytes Relative: 11 %
Neutro Abs: 1.8 10*3/uL (ref 1.7–7.7)
Neutrophils Relative %: 49 %
Platelets: 158 10*3/uL (ref 150–400)
RBC: 4.48 MIL/uL (ref 3.87–5.11)
RDW: 14.6 % (ref 11.5–15.5)
WBC: 3.7 10*3/uL — ABNORMAL LOW (ref 4.0–10.5)
nRBC: 0 % (ref 0.0–0.2)

## 2023-08-31 LAB — KAPPA/LAMBDA LIGHT CHAINS
Kappa free light chain: 14.9 mg/L (ref 3.3–19.4)
Kappa, lambda light chain ratio: 1.09 (ref 0.26–1.65)
Lambda free light chains: 13.7 mg/L (ref 5.7–26.3)

## 2023-09-01 LAB — MULTIPLE MYELOMA PANEL, SERUM
Albumin SerPl Elph-Mcnc: 3.5 g/dL (ref 2.9–4.4)
Albumin/Glob SerPl: 1.3 (ref 0.7–1.7)
Alpha 1: 0.2 g/dL (ref 0.0–0.4)
Alpha2 Glob SerPl Elph-Mcnc: 0.9 g/dL (ref 0.4–1.0)
B-Globulin SerPl Elph-Mcnc: 0.9 g/dL (ref 0.7–1.3)
Gamma Glob SerPl Elph-Mcnc: 0.8 g/dL (ref 0.4–1.8)
Globulin, Total: 2.9 g/dL (ref 2.2–3.9)
IgA: 125 mg/dL (ref 64–422)
IgG (Immunoglobin G), Serum: 902 mg/dL (ref 586–1602)
IgM (Immunoglobulin M), Srm: 64 mg/dL (ref 26–217)
Total Protein ELP: 6.4 g/dL (ref 6.0–8.5)

## 2023-09-02 DIAGNOSIS — M170 Bilateral primary osteoarthritis of knee: Secondary | ICD-10-CM | POA: Diagnosis not present

## 2023-09-02 DIAGNOSIS — M25562 Pain in left knee: Secondary | ICD-10-CM | POA: Diagnosis not present

## 2023-09-02 DIAGNOSIS — M25561 Pain in right knee: Secondary | ICD-10-CM | POA: Diagnosis not present

## 2023-09-02 DIAGNOSIS — R262 Difficulty in walking, not elsewhere classified: Secondary | ICD-10-CM | POA: Diagnosis not present

## 2023-09-09 ENCOUNTER — Encounter: Payer: Self-pay | Admitting: Hematology and Oncology

## 2023-09-09 ENCOUNTER — Other Ambulatory Visit (INDEPENDENT_AMBULATORY_CARE_PROVIDER_SITE_OTHER): Payer: Self-pay | Admitting: Adult Health

## 2023-09-09 ENCOUNTER — Inpatient Hospital Stay: Payer: Medicare PPO | Admitting: Hematology and Oncology

## 2023-09-09 VITALS — Ht 67.99 in | Wt 228.0 lb

## 2023-09-09 DIAGNOSIS — C9001 Multiple myeloma in remission: Secondary | ICD-10-CM

## 2023-09-09 DIAGNOSIS — D72819 Decreased white blood cell count, unspecified: Secondary | ICD-10-CM | POA: Insufficient documentation

## 2023-09-09 DIAGNOSIS — E66811 Obesity, class 1: Secondary | ICD-10-CM | POA: Diagnosis not present

## 2023-09-09 NOTE — Progress Notes (Signed)
 HEMATOLOGY-ONCOLOGY ELECTRONIC VISIT PROGRESS NOTE  Patient Care Team: Jonathon Neighbors, MD as PCP - General (Family Medicine) Avanell Leigh, MD as PCP - Cardiology (Cardiology) Lenton Rail, MD as Consulting Physician (Otolaryngology) Almeda Jacobs, MD as Consulting Physician (Hematology and Oncology)  I connected with the patient via telephone conference and verified that I am speaking with the correct person using two identifiers. The patient's location is at home and I am providing care from the [facility] I discussed the limitations, risks, security and privacy concerns of performing an evaluation and management service by e-visits and the availability of in person appointments.  I also discussed with the patient that there may be a patient responsible charge related to this service. The patient expressed understanding and agreed to proceed.   ASSESSMENT & PLAN:  Multiple myeloma in remission [facility]) I have reviewed results of her recent myeloma panel Her last treatment was in April 2023 Overall, she is still in complete remission I plan to repeat labs and follow-up again in 6 months, since she has remained in remission for 2 years Since discontinuation of treatment  Obesity, Class I, BMI 30-34.9 She continues to have successful weight loss I congratulated her effort  Leukopenia Due to recent infection It is only mild The patient is reassured  No orders of the defined types were placed in this encounter.   INTERVAL HISTORY: Please see below for problem oriented charting. The purpose of today's discussion is to review test results and recent myeloma panel She had 2 bouts of infection in February and March but is recovering well Overall, she is doing good She has lost weight We discussed recent myeloma panel test results and future follow-up  SUMMARY OF ONCOLOGIC HISTORY: Oncology History  Multiple myeloma in remission (HCC)  03/22/2016 Bone Marrow Biopsy   Bone  Marrow Biopsy: Plasma cells 17% by Aspirate and 30% by CD 138 stain. Plasma cell neoplasm, Kappa Restricted Normal cytogenetics, FISH positive for +11 and +14 and +7   04/13/2016 Imaging   Skeletal survey showed lucencies within the calvarium worrisome for myeloma. No definite abnormal lytic or blastic lesions are observed elsewhere.   04/15/2016 Cancer Staging   Staging form: Multiple Myeloma, AJCC 6th Edition - Clinical stage from 04/15/2016: Stage IIA - Signed by Almeda Jacobs, MD on 07/31/2021 Staged by: Managing physician Diagnostic confirmation: Positive histology Specimen type: Biopsy / Limited Resection Histopathologic type: Neoplasm, malignant   04/19/2016 - 09/10/2016 Chemotherapy   The patient consented to treatment with Revlimid , dexamethasone  and Velcade    09/14/2016 Bone Marrow Biopsy   In summary, there is a normal cellular marrow with trilineage hematopoiesis.  A mild plasmacytosis is present, but there is no definitive evidence of residual multiple myeloma   10/12/2016 - 10/12/2016 Chemotherapy   She received melphalan as conditioning chemo   10/13/2016 Bone Marrow Transplant   She received autologous stem cell transplant at [facility]   01/24/2017 Bone Marrow Biopsy   BONE MARROW: -          Normocellular marrow for age (50%) with trilineage hematopoiesis. -     No morphologic or immunohistochemical evidence of involvement by plasma cell neoplasm (see comment)  PERIPHERAL BLOOD: -          Unremarkable (see CBC data)  COMMENT: The bone marrow is normocellular for age and shows adequate trilineage hematopoiesis without significant (<10%) dysplastic changes present. Blasts are not increased. Plasma cells represent about 2% of total cells on aspirate smears. Appropriately controlled immunohistochemical  stains are performed on the core biopsy. CD138 highlights small interstitial plasma cells comprising approximately 2% of total cells. These plasma cells appear polytypic as  demonstrated by kappa and lambda in-situ hybridization.   01/25/2017 PET scan   No FDG avid osseous lesions or masses are identified.   02/18/2017 - 04/01/2017 Chemotherapy   She received weekly Velcade , Pomalyst  and Dexamethasone    04/15/2017 - 08/28/2021 Chemotherapy   The patient had Pomalyst  only   04/20/2017 Bone Marrow Biopsy   Bone Marrow (BM) and Peripheral Blood (PB) FINAL PATHOLOGIC DIAGNOSIS BONE MARROW: Normocellular bone marrow (40%) with normal numbers of megakaryocytes, erythroid hyperplasia and no increase in plasma cells (1%).   10/20/2021 Imaging   Bone density is normal    I discussed the assessment and treatment plan with the patient. The patient was provided an opportunity to ask questions and all were answered. The patient agreed with the plan and demonstrated an understanding of the instructions. The patient was advised to call back or seek an in-person evaluation if the symptoms worsen or if the condition fails to improve as anticipated.    I spent 20 minutes for the appointment reviewing test results, discuss management and coordination of care.  Almeda Jacobs, MD 09/09/2023 11:06 AM

## 2023-09-09 NOTE — Assessment & Plan Note (Signed)
 Due to recent infection It is only mild The patient is reassured

## 2023-09-09 NOTE — Assessment & Plan Note (Signed)
 I have reviewed results of her recent myeloma panel Her last treatment was in April 2023 Overall, she is still in complete remission I plan to repeat labs and follow-up again in 6 months, since she has remained in remission for 2 years Since discontinuation of treatment

## 2023-09-09 NOTE — Assessment & Plan Note (Signed)
 She continues to have successful weight loss I congratulated her effort

## 2023-09-13 ENCOUNTER — Telehealth: Payer: Self-pay | Admitting: Hematology and Oncology

## 2023-09-13 ENCOUNTER — Encounter (INDEPENDENT_AMBULATORY_CARE_PROVIDER_SITE_OTHER): Payer: Self-pay | Admitting: Physician Assistant

## 2023-09-13 ENCOUNTER — Ambulatory Visit (INDEPENDENT_AMBULATORY_CARE_PROVIDER_SITE_OTHER): Admitting: Physician Assistant

## 2023-09-13 VITALS — BP 95/65 | HR 60 | Temp 97.3°F | Ht 68.0 in | Wt 229.0 lb

## 2023-09-13 DIAGNOSIS — M179 Osteoarthritis of knee, unspecified: Secondary | ICD-10-CM

## 2023-09-13 DIAGNOSIS — Z6834 Body mass index (BMI) 34.0-34.9, adult: Secondary | ICD-10-CM

## 2023-09-13 DIAGNOSIS — E669 Obesity, unspecified: Secondary | ICD-10-CM | POA: Diagnosis not present

## 2023-09-13 DIAGNOSIS — E785 Hyperlipidemia, unspecified: Secondary | ICD-10-CM

## 2023-09-13 DIAGNOSIS — R7303 Prediabetes: Secondary | ICD-10-CM

## 2023-09-13 MED ORDER — WEGOVY 1 MG/0.5ML ~~LOC~~ SOAJ: 1.0000 mg | SUBCUTANEOUS | 0 refills | Status: DC

## 2023-09-13 NOTE — Progress Notes (Signed)
 SUBJECTIVE: Discussed the use of AI scribe software for clinical note transcription with the patient, who gave verbal consent to proceed.  Chief Complaint: Obesity  Interim History: She is down 2 lbs since last visit.  Down 39 lbs overall TBW loss of 14.6%  Carolyn Newman is here to discuss her progress with her obesity treatment plan. She is on the Category 2 Plan and states she is following her eating plan approximately 90 % of the time. She states she is exercising walking, stationary bike, peddle bike for 45-90 minutes 3-4 times per week. Pharmacotherapy: Started on Wegovy  0.25mg  on/about 12/29/2022 Wegovy  increased from 0.25mg  to 0.5mg  on/about 04/12/2023 Wegovy  increased from 0.5mg  to 1mg  on/about 07/18/2023  Carolyn Newman is a 73 year old female who presents for follow-up of her obesity treatment plan.  She has been on Wegovy  1 mg weekly for weight management, with additional indications for hyperlipidemia, heart disease, and chronic kidney disease. Her appetite and hunger have been manageable on this dose. She transitioned from 0.75 mg to 1 mg due to insufficient progress at the lower dose. She has lost a total of 39 pounds, necessitating new clothing purchases.   Her knee pain has significantly improved, eliminating the need for biannual injections. No nausea, difficulty swallowing, changes in vision, or mood alterations since starting the medication.  Her exercise routine includes walking and using a 'Legasizer' device. She wants to focus more on exercise, particularly weight training, during the summer. 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  She travels frequently for work, with upcoming trips to [place], [place] , [place], [place], and [place] . She manages her diet by eating fruits and proteins for breakfast and lunch, especially in hot weather,  and ensures she stays hydrated during her travels.   OBJECTIVE: Visit Diagnoses: Problem List Items Addressed This Visit     Hyperlipidemia - Primary   Prediabetes   Other Visit Diagnoses       Osteoarthritis of knee, unspecified laterality, unspecified osteoarthritis type         Obesity, Starting BMI 42.73       Relevant Medications   Semaglutide -Weight Management (WEGOVY ) 1 MG/0.5ML SOAJ     BMI 34.0-34.9,adult Current BMI 34.8         Obesity Obesity management with Wegovy  1 mg weekly has been effective, resulting in a weight loss of 39 pounds. No significant side effects such as nausea or dysphagia. Improved joint pain, particularly in the knees, with weight loss. Exercise regimen includes walking and use of Legasizer, with plans to increase weight training to maintain muscle mass. She is motivated to continue her exercise routine despite frequent travel for work. - Continue Wegovy  1 mg weekly. - Encourage continuation of current exercise regimen and increase weight training. - Refill Wegovy  prescription at [facility], one month supply. - Follow up in four weeks with Napoleon Backer, NP  Meds ordered this encounter  Medications   Semaglutide -Weight Management (WEGOVY ) 1 MG/0.5ML SOAJ    Sig: Inject 1 mg into the skin once a week.    Dispense:  2 mL    Refill:  0    Hyperlipidemia LDL is at goal. Medication(s): Crestor  20 mg Daily Cardiovascular risk factors: advanced age (older than 51 for men, 63 for women), dyslipidemia, hypertension, and obesity (BMI >= 30 kg/m2)  Lab Results  Component Value Date   CHOL 159  01/07/2022   HDL 72 01/07/2022   LDLCALC 75 01/07/2022   TRIG 59 01/07/2022   CHOLHDL 2.2 01/07/2022   Lab Results  Component Value Date   ALT 13 08/30/2023   AST 26 08/30/2023   ALKPHOS 50 08/30/2023   BILITOT 0.7 08/30/2023   The 10-year ASCVD risk score (Arnett DK, et al., 2019) is: 14.4%   Values used to calculate the score:     Age: 34  years     Sex: Female     Is Non-Hispanic African American: Yes     Diabetic: Yes     Tobacco smoker: No     Systolic Blood Pressure: 95 mmHg     Is BP treated: Yes     HDL Cholesterol: 72 mg/dL     Total Cholesterol: 159 mg/dL  Plan: Continue statin. Continue Wegovy  to also decrease CV risks Continue to work on nutrition plan -decreasing simple carbohydrates, increasing lean proteins, decreasing saturated fats and cholesterol , avoiding trans fats and exercise as able to promote weight loss, improve lipids and decrease cardiovascular risks.  Prediabetes Last A1c was 5.7- not at goal. Insulin  9.9- not at goal  Medication(s): Wegovy  1.0 mg SQ weekly Denies mass in neck, dysphagia, dyspepsia, persistent hoarseness, abdominal pain, or N/V/Constipation or diarrhea. Has annual eye exam. Mood is stable.   Polyphagia:No Lab Results  Component Value Date   HGBA1C 5.7 (H) 07/18/2023   HGBA1C 5.9 (H) 01/07/2022   HGBA1C 5.6 11/27/2019   Lab Results  Component Value Date   INSULIN  9.9 07/18/2023   INSULIN  6.7 01/07/2022   INSULIN  9.2 11/27/2019    Plan: Continue and refill Wegovy  1.0 mg SQ weekly Continue working on nutrition plan to decrease simple carbohydrates, increase lean proteins and exercise to promote weight loss, improve glycemic control and prevent progression to Type 2 diabetes.   Knee OA Recently seen in follow up and did not have to have usual injections for knee pain/treatment with significant decrease in pain with weight loss.  Plan: Continue healthy weight loss to off load knees and other weight bearing joints.   Vitals Temp: (!) 97.3 F (36.3 C) BP: 95/65 Pulse Rate: 60 SpO2: 100 %   Anthropometric Measurements Height: 5\' 8"  (1.727 m) Weight: 229 lb (103.9 kg) BMI (Calculated): 34.83 Weight at Last Visit: 231 lb Weight Lost Since Last Visit: 2 lb Weight Gained Since Last Visit: 0 Starting Weight: 268 lb Total Weight Loss (lbs): 39 lb (17.7 kg) Peak  Weight: 302 lb   Body Composition  Body Fat %: 47 % Fat Mass (lbs): 107.8 lbs Muscle Mass (lbs): 115.2 lbs Total Body Water (lbs): 79.2 lbs Visceral Fat Rating : 15   Other Clinical Data Fasting: no Labs: no Today's Visit #: 20 Starting Date: 01/07/22     ASSESSMENT AND PLAN:  Diet: Tanyia is currently in the action stage of change. As such, her goal is to continue with weight loss efforts. She has agreed to Category 2 Plan.  Exercise: Daana has been instructed to work up to a goal of 150 minutes of combined cardio and strengthening exercise per week for weight loss and overall health benefits.   Behavior Modification:  We discussed the following Behavioral Modification Strategies today: increasing lean protein intake, decreasing simple carbohydrates, increasing vegetables, increase H2O intake, increase high fiber foods, no skipping meals, travel eating strategies, avoiding temptations, and planning for success. We discussed various medication options to help Tauna with her weight loss efforts and we both  agreed to continue current treatment plan and continue to work on nutritional and behavioral strategies to promote weight loss.  .  Return in about 4 weeks (around 10/11/2023).Aaron Aas She was informed of the importance of frequent follow up visits to maximize her success with intensive lifestyle modifications for her multiple health conditions.  Attestation Statements:   Reviewed by clinician on day of visit: allergies, medications, problem list, medical history, surgical history, family history, social history, and previous encounter notes.   Time spent on visit including pre-visit chart review and post-visit care and charting was 24 minutes.    Kayler Buckholtz, PA-C

## 2023-09-13 NOTE — Telephone Encounter (Signed)
 Confirmed with pt scheduled appt dates and times

## 2023-09-26 ENCOUNTER — Encounter: Payer: Self-pay | Admitting: Hematology and Oncology

## 2023-09-26 DIAGNOSIS — R7303 Prediabetes: Secondary | ICD-10-CM | POA: Diagnosis not present

## 2023-09-26 DIAGNOSIS — E1169 Type 2 diabetes mellitus with other specified complication: Secondary | ICD-10-CM | POA: Diagnosis not present

## 2023-09-26 DIAGNOSIS — E785 Hyperlipidemia, unspecified: Secondary | ICD-10-CM | POA: Diagnosis not present

## 2023-09-26 DIAGNOSIS — I10 Essential (primary) hypertension: Secondary | ICD-10-CM | POA: Diagnosis not present

## 2023-09-26 DIAGNOSIS — C9001 Multiple myeloma in remission: Secondary | ICD-10-CM | POA: Diagnosis not present

## 2023-09-26 DIAGNOSIS — J301 Allergic rhinitis due to pollen: Secondary | ICD-10-CM | POA: Diagnosis not present

## 2023-09-26 DIAGNOSIS — R0981 Nasal congestion: Secondary | ICD-10-CM | POA: Diagnosis not present

## 2023-09-26 DIAGNOSIS — E782 Mixed hyperlipidemia: Secondary | ICD-10-CM | POA: Diagnosis not present

## 2023-09-26 DIAGNOSIS — Z0000 Encounter for general adult medical examination without abnormal findings: Secondary | ICD-10-CM | POA: Diagnosis not present

## 2023-09-26 DIAGNOSIS — E8881 Metabolic syndrome: Secondary | ICD-10-CM | POA: Diagnosis not present

## 2023-09-28 DIAGNOSIS — M170 Bilateral primary osteoarthritis of knee: Secondary | ICD-10-CM | POA: Diagnosis not present

## 2023-10-07 ENCOUNTER — Encounter: Payer: Self-pay | Admitting: Hematology and Oncology

## 2023-10-11 ENCOUNTER — Encounter (INDEPENDENT_AMBULATORY_CARE_PROVIDER_SITE_OTHER): Payer: Self-pay | Admitting: Adult Health

## 2023-10-11 ENCOUNTER — Ambulatory Visit (INDEPENDENT_AMBULATORY_CARE_PROVIDER_SITE_OTHER): Admitting: Adult Health

## 2023-10-11 ENCOUNTER — Other Ambulatory Visit: Payer: Self-pay | Admitting: Hematology and Oncology

## 2023-10-11 ENCOUNTER — Encounter: Payer: Self-pay | Admitting: Cardiovascular Disease

## 2023-10-11 VITALS — BP 103/68 | HR 73 | Temp 97.9°F | Ht 68.0 in | Wt 224.0 lb

## 2023-10-11 DIAGNOSIS — N1831 Chronic kidney disease, stage 3a: Secondary | ICD-10-CM | POA: Diagnosis not present

## 2023-10-11 DIAGNOSIS — Z6834 Body mass index (BMI) 34.0-34.9, adult: Secondary | ICD-10-CM

## 2023-10-11 DIAGNOSIS — E785 Hyperlipidemia, unspecified: Secondary | ICD-10-CM

## 2023-10-11 DIAGNOSIS — R7303 Prediabetes: Secondary | ICD-10-CM | POA: Diagnosis not present

## 2023-10-11 DIAGNOSIS — C9001 Multiple myeloma in remission: Secondary | ICD-10-CM

## 2023-10-11 DIAGNOSIS — T451X5A Adverse effect of antineoplastic and immunosuppressive drugs, initial encounter: Secondary | ICD-10-CM

## 2023-10-11 DIAGNOSIS — E669 Obesity, unspecified: Secondary | ICD-10-CM

## 2023-10-11 MED ORDER — WEGOVY 1 MG/0.5ML ~~LOC~~ SOAJ: 1.0000 mg | SUBCUTANEOUS | 0 refills | Status: DC

## 2023-10-11 NOTE — Progress Notes (Signed)
 Referral created.

## 2023-10-11 NOTE — Progress Notes (Signed)
 WEIGHT SUMMARY AND BIOMETRICS  Vitals Temp: 97.9 F (36.6 C) BP: 103/68 Pulse Rate: 73 SpO2: 96 %   Anthropometric Measurements Height: 5\' 8"  (1.727 m) Weight: 224 lb (101.6 kg) BMI (Calculated): 34.07 Weight at Last Visit: 229 lb Weight Lost Since Last Visit: 5 l Weight Gained Since Last Visit: 0 Starting Weight: 268 lb Total Weight Loss (lbs): 44 lb (20 kg) Peak Weight: 302 lb   Body Composition  Body Fat %: 46.8 % Fat Mass (lbs): 105 lbs Muscle Mass (lbs): 113.2 lbs Total Body Water (lbs): 78.2 lbs Visceral Fat Rating : 14   Other Clinical Data Fasting: no Labs: no Today's Visit #: 21 Starting Date: 01/07/22    Chief Complaint:   OBESITY Carolyn Newman is here to discuss her progress with her obesity treatment plan.  She is on the the Category 2 Plan and states she is following her eating plan approximately 75 % of the time.  She states she is exercising Walking, Strength Training 25-30 minutes daily/3 times per week.  Interim History:   Her Oncologist is pleased with last set of labs and adjusted f/u from Q3M to Q6M  Wegovy  increased from 0.5mg  to 1mg  on/about 07/18/2023 Denies mass in neck, dysphagia, dyspepsia, persistent hoarseness, abdominal pain, or N/V/C   She continues to experience bilateral knee pain and bilateral neuropathic pain in lower legs  Of Note- Her prediabetes has improved  Subjective:   1. Prediabetes Started on Wegovy  0.25mg  on/about 12/29/2022 Wegovy  increased from 0.25mg  to 0.5mg  on/about 04/12/2023 Wegovy  increased from 0.5mg  to 1mg  on/about 07/18/2023 Denies mass in neck, dysphagia, dyspepsia, persistent hoarseness, abdominal pain, or N/V/C   2. Hyperlipidemia, unspecified hyperlipidemia type Lipid Panel     Component Value Date/Time   CHOL 159 01/07/2022 1059   TRIG 59 01/07/2022 1059   HDL 72 01/07/2022 1059   CHOLHDL 2.2 01/07/2022 1059   LDLCALC 75 01/07/2022 1059   LABVLDL 12 01/07/2022 1059    She is on daily  Crestor  20mg  and weekly Wegovy  1mg   3. Chronic kidney disease, stage 3a (HCC)  Latest Reference Range & Units 03/01/23 14:27 05/31/23 09:19 08/30/23 13:43  Creatinine 0.44 - 1.00 mg/dL 6.96 (H) 2.95 (H) 2.84  (H): Data is abnormally high  Latest Reference Range & Units 03/01/23 14:27 05/31/23 09:19 08/30/23 13:43  GFR, Estimated >60 mL/min 57 (L) 57 (L) >60  (L): Data is abnormally low  Kidney fx greatly improved BP stable, yet soft at OV She denies sx's of hypotension  Home readings: SBP 110s DBP: 60-70s She is currently on Hyzaar  100/25mg  1/2 tab daily  Assessment/Plan:   1. Prediabetes (Primary) Continue healthy eating and regular exercise Continue weekly Wegovy  1mg   2. Hyperlipidemia, unspecified hyperlipidemia type Continue healthy eating and regular exercise Continue daily Crestor  20mg  and weekly Wegovy  1mg   3. Chronic kidney disease, stage 3a (HCC) Keep BP well controlled Remain well hydrated with water Continue to avoid Nephrotoxic substances  4. BMI 34.0-34.9,adult Current BMI 34.1 Refill   Semaglutide -Weight Management (WEGOVY ) 1 MG/0.5ML SOAJ Inject 1 mg into the skin once a week. Dispense: 2 mL, Refills: 0 ordered   Carolyn Newman is currently in the action stage of change. As such, her goal is to continue with weight loss efforts. She has agreed to the Category 2 Plan.   Exercise goals: Older adults should follow the adult guidelines. When older adults cannot meet the adult guidelines, they should be as physically active as their abilities and conditions will allow.  Older adults should do exercises that maintain or improve balance if they are at risk of falling.  Older adults should determine their level of effort for physical activity relative to their level of fitness.  Older adults with chronic conditions should understand whether and how their conditions affect their ability to do regular physical activity safely.  Behavioral modification strategies: increasing  lean protein intake, decreasing simple carbohydrates, increasing vegetables, increasing water intake, no skipping meals, meal planning and cooking strategies, keeping healthy foods in the home, and planning for success.  Carolyn Newman has agreed to follow-up with our clinic in 4 weeks. She was informed of the importance of frequent follow-up visits to maximize her success with intensive lifestyle modifications for her multiple health conditions.   Objective:   Blood pressure 103/68, pulse 73, temperature 97.9 F (36.6 C), height 5\' 8"  (1.727 m), weight 224 lb (101.6 kg), SpO2 96%. Body mass index is 34.06 kg/m.  General: Cooperative, alert, well developed, in no acute distress. HEENT: Conjunctivae and lids unremarkable. Cardiovascular: Regular rhythm.  Lungs: Normal work of breathing. Neurologic: No focal deficits.   Lab Results  Component Value Date   CREATININE 0.90 08/30/2023   BUN 15 08/30/2023   NA 137 08/30/2023   K 3.7 08/30/2023   CL 103 08/30/2023   CO2 29 08/30/2023   Lab Results  Component Value Date   ALT 13 08/30/2023   AST 26 08/30/2023   ALKPHOS 50 08/30/2023   BILITOT 0.7 08/30/2023   Lab Results  Component Value Date   HGBA1C 5.7 (H) 07/18/2023   HGBA1C 5.9 (H) 01/07/2022   HGBA1C 5.6 11/27/2019   Lab Results  Component Value Date   INSULIN  9.9 07/18/2023   INSULIN  6.7 01/07/2022   INSULIN  9.2 11/27/2019   Lab Results  Component Value Date   TSH 1.480 01/07/2022   Lab Results  Component Value Date   CHOL 159 01/07/2022   HDL 72 01/07/2022   LDLCALC 75 01/07/2022   TRIG 59 01/07/2022   CHOLHDL 2.2 01/07/2022   Lab Results  Component Value Date   VD25OH 60.2 07/18/2023   VD25OH 75.5 01/07/2022   VD25OH 89.78 07/18/2020   Lab Results  Component Value Date   WBC 3.7 (L) 08/30/2023   HGB 13.0 08/30/2023   HCT 39.7 08/30/2023   MCV 88.6 08/30/2023   PLT 158 08/30/2023   Lab Results  Component Value Date   IRON 108 11/27/2019   TIBC 250  11/27/2019   FERRITIN 99 11/27/2019    Attestation Statements:   Reviewed by clinician on day of visit: allergies, medications, problem list, medical history, surgical history, family history, social history, and previous encounter notes.  I have reviewed the above documentation for accuracy and completeness, and I agree with the above. - Marcianna Daily d. Deborrah Mabin, NP-C

## 2023-11-01 DIAGNOSIS — M170 Bilateral primary osteoarthritis of knee: Secondary | ICD-10-CM | POA: Diagnosis not present

## 2023-11-08 ENCOUNTER — Inpatient Hospital Stay: Attending: Hematology and Oncology | Admitting: Internal Medicine

## 2023-11-08 VITALS — BP 123/75 | HR 64 | Temp 97.5°F | Resp 20 | Wt 226.0 lb

## 2023-11-08 DIAGNOSIS — C9000 Multiple myeloma not having achieved remission: Secondary | ICD-10-CM | POA: Diagnosis not present

## 2023-11-08 DIAGNOSIS — G620 Drug-induced polyneuropathy: Secondary | ICD-10-CM | POA: Insufficient documentation

## 2023-11-08 DIAGNOSIS — C9001 Multiple myeloma in remission: Secondary | ICD-10-CM | POA: Diagnosis not present

## 2023-11-08 DIAGNOSIS — Z9221 Personal history of antineoplastic chemotherapy: Secondary | ICD-10-CM | POA: Diagnosis not present

## 2023-11-08 DIAGNOSIS — T451X5A Adverse effect of antineoplastic and immunosuppressive drugs, initial encounter: Secondary | ICD-10-CM | POA: Insufficient documentation

## 2023-11-08 MED ORDER — PREGABALIN 50 MG PO CAPS: 50.0000 mg | ORAL_CAPSULE | Freq: Every evening | ORAL | 2 refills | Status: DC

## 2023-11-08 NOTE — Progress Notes (Signed)
 [facility] [address]  [place], [place] [address] [phone]   New Patient Evaluation  Date of Service: 11/08/23 Patient Name: Carolyn Newman Patient MRN: [id number] Patient DOB: March 11, 1951 Provider: Arthea MARLA Manns, MD  Identifying Statement:  IYANI Newman is a 73 y.o. female with Peripheral neuropathy due to chemotherapy [facility]) who presents for initial consultation and evaluation regarding cancer associated neurologic deficits.    Referring Provider: [name], MD [address] [place],  [place] [address]  Primary Cancer:  Oncologic History: Oncology History  Multiple myeloma in remission (HCC)  03/22/2016 Bone Marrow Biopsy   Bone Marrow Biopsy: Plasma cells 17% by Aspirate and 30% by CD 138 stain. Plasma cell neoplasm, Kappa Restricted Normal cytogenetics, FISH positive for +11 and +14 and +7   04/13/2016 Imaging   Skeletal survey showed lucencies within the calvarium worrisome for myeloma. No definite abnormal lytic or blastic lesions are observed elsewhere.   04/15/2016 Cancer Staging   Staging form: Multiple Myeloma, AJCC 6th Edition - Clinical stage from 04/15/2016: Stage IIA - Signed by [name], MD on 07/31/2021 Staged by: Managing physician Diagnostic confirmation: Positive histology Specimen type: Biopsy / Limited Resection Histopathologic type: Neoplasm, malignant   04/19/2016 - 09/10/2016 Chemotherapy   The patient consented to treatment with Revlimid , dexamethasone  and Velcade    09/14/2016 Bone Marrow Biopsy   In summary, there is a normal cellular marrow with trilineage hematopoiesis.  A mild plasmacytosis is present, but there is no definitive evidence of residual multiple myeloma   10/12/2016 - 10/12/2016 Chemotherapy   She received melphalan as conditioning chemo   10/13/2016 Bone Marrow Transplant   She received autologous stem cell transplant at [facility]   01/24/2017 Bone Marrow Biopsy   BONE  MARROW: -          Normocellular marrow for age (50%) with trilineage hematopoiesis. -     No morphologic or immunohistochemical evidence of involvement by plasma cell neoplasm (see comment)  PERIPHERAL BLOOD: -          Unremarkable (see CBC data)  COMMENT: The bone marrow is normocellular for age and shows adequate trilineage hematopoiesis without significant (<10%) dysplastic changes present. Blasts are not increased. Plasma cells represent about 2% of total cells on aspirate smears. Appropriately controlled immunohistochemical stains are performed on the core biopsy. CD138 highlights small interstitial plasma cells comprising approximately 2% of total cells. These plasma cells appear polytypic as demonstrated by kappa and lambda in-situ hybridization.   01/25/2017 PET scan   No FDG avid osseous lesions or masses are identified.   02/18/2017 - 04/01/2017 Chemotherapy   She received weekly Velcade , Pomalyst  and Dexamethasone    04/15/2017 - 08/28/2021 Chemotherapy   The patient had Pomalyst  only   04/20/2017 Bone Marrow Biopsy   Bone Marrow (BM) and Peripheral Blood (PB) FINAL PATHOLOGIC DIAGNOSIS BONE MARROW: Normocellular bone marrow (40%) with normal numbers of megakaryocytes, erythroid hyperplasia and no increase in plasma cells (1%).   10/20/2021 Imaging   Bone density is normal     History of Present Illness: The patient's records from the referring physician were obtained and reviewed and the patient interviewed to confirm this HPI.  Carolyn Newman presents today to review neuropathy symptoms.  She describes 7-8 years history of burning and tingling in her feet and lower legs.  There is some numbness as well, no weakness.  Onset was following chemotherapy for multiple myeloma in 2017 and 2018.  Symptoms may be somewhat worse in recent months, although they fluctuate overall.  No longer dosing Pomalyst  with Dr. Lonn.  Medications: Current Outpatient Medications on File Prior to  Visit  Medication Sig Dispense Refill   acetaminophen (TYLENOL) 500 MG tablet Take 500 mg by mouth every 6 (six) hours as needed.     Albuterol  Sulfate (PROAIR  RESPICLICK) 108 (90 Base) MCG/ACT AEPB Take 1 puff by mouth daily as needed.     Aspirin 81 MG CAPS ASPIRIN 81 MG     Calcium  Carb-Cholecalciferol (CALCIUM  500 + D) 500-5 MG-MCG TABS Take 1 tablet by mouth daily.     cetirizine (ZYRTEC) 10 MG tablet Take 10 mg by mouth daily.     Chlorphen-PE-Acetaminophen (NOREL AD PO) Norel AD     docusate sodium (COLACE) 100 MG capsule Take 200 mg by mouth daily.     famotidine (PEPCID) 40 MG tablet Take 40 mg by mouth daily.     fluticasone (FLONASE ALLERGY RELIEF) 50 MCG/ACT nasal spray Place 1 spray into both nostrils daily as needed.     losartan -hydrochlorothiazide (HYZAAR ) 100-25 MG tablet Take 1 tablet by mouth daily. (Patient taking differently: Take 0.5 tablets by mouth daily. TAKES 1/2 TABLET DAILY) 90 tablet 3   Multiple Vitamin (MULTIVITAMIN) tablet Take 1 tablet by mouth daily.     Polyethylene Glycol 3350 (MIRALAX PO) Take by mouth as needed.     rosuvastatin  (CRESTOR ) 20 MG tablet Take 1 tablet (20 mg total) by mouth at bedtime. 90 tablet 3   Semaglutide -Weight Management (WEGOVY ) 1 MG/0.5ML SOAJ Inject 1 mg into the skin once a week. 2 mL 0   TURMERIC PO      No current facility-administered medications on file prior to visit.    Allergies:  Allergies  Allergen Reactions   Revlimid  [Lenalidomide ] Rash   Past Medical History:  Past Medical History:  Diagnosis Date   Alopecia    Anemia    Anosmia    Back pain    Bilateral knee pain    Cancer (HCC)    myeloma   Chewing difficulty    Constipation    Elevated serum creatinine    Hyperlipidemia    Hypertension    Joint pain    Leukopenia    Multiple myeloma (HCC)    Neuropathy    Right and left feet    Obesity    SOB (shortness of breath)    Thoracic aortic aneurysm without rupture (HCC)    Past Surgical History:   Past Surgical History:  Procedure Laterality Date   CATARACT EXTRACTION     EYE SURGERY     INJECTION KNEE     LIMBAL STEM CELL TRANSPLANT     Social History:  Social History   Socioeconomic History   Marital status: Single    Spouse name: Not on file   Number of children: Not on file   Years of education: Not on file   Highest education level: Not on file  Occupational History   Occupation: Nurse, children's  Tobacco Use   Smoking status: Never   Smokeless tobacco: Never  Vaping Use   Vaping status: Never Used  Substance and Sexual Activity   Alcohol use: No   Drug use: No   Sexual activity: Not on file  Other Topics Concern   Not on file  Social History Narrative   Right handed    2 cups of tea every other day    Lives at home by herself.  Social Drivers of Corporate investment banker Strain: Not on file  Food Insecurity: No Food Insecurity (11/08/2023)   Hunger Vital Sign    Worried About Running Out of Food in the Last Year: Never true    Ran Out of Food in the Last Year: Never true  Transportation Needs: No Transportation Needs (11/08/2023)   PRAPARE - Administrator, Civil Service (Medical): No    Lack of Transportation (Non-Medical): No  Physical Activity: Not on file  Stress: Not on file  Social Connections: Not on file  Intimate Partner Violence: Not At Risk (11/08/2023)   Humiliation, Afraid, Rape, and Kick questionnaire    Fear of Current or Ex-Partner: No    Emotionally Abused: No    Physically Abused: No    Sexually Abused: No   Family History:  Family History  Problem Relation Age of Onset   Cancer Mother        lymphoma   Diabetes Mother    Hypertension Mother    Hyperlipidemia Mother    Depression Mother    Obesity Mother    Heart disease Mother    Cancer Father        prostate ca   Heart disease Father    Colon cancer Maternal Grandmother    Diabetes Maternal Grandfather     Review of Systems: Constitutional:  Doesn't report fevers, chills or abnormal weight loss Eyes: Doesn't report blurriness of vision Ears, nose, mouth, throat, and face: Doesn't report sore throat Respiratory: Doesn't report cough, dyspnea or wheezes Cardiovascular: Doesn't report palpitation, chest discomfort  Gastrointestinal:  Doesn't report nausea, constipation, diarrhea GU: Doesn't report incontinence Skin: Doesn't report skin rashes Neurological: Per HPI Musculoskeletal: Doesn't report joint pain Behavioral/Psych: Doesn't report anxiety  Physical Exam: Vitals:   11/08/23 1139  BP: 123/75  Pulse: 64  Resp: 20  Temp: (!) 97.5 F (36.4 C)  SpO2: 100%   KPS: 90. General: Alert, cooperative, pleasant, in no acute distress Head: Normal EENT: No conjunctival injection or scleral icterus.  Lungs: Resp effort normal Cardiac: Regular rate Abdomen: Non-distended abdomen Skin: No rashes cyanosis or petechiae. Extremities: No clubbing or edema  Neurologic Exam: Mental Status: Awake, alert, attentive to examiner. Oriented to self and environment. Language is fluent with intact comprehension.  Cranial Nerves: Visual acuity is grossly normal. Visual fields are full. Extra-ocular movements intact. No ptosis. Face is symmetric Motor: Tone and bulk are normal. Power is full in both arms and legs. Reflexes are symmetric, no pathologic reflexes present.  Sensory: Stocking changes Gait: Normal.   Labs: I have reviewed the data as listed    Component Value Date/Time   NA 137 08/30/2023 1343   NA 143 02/22/2023 1225   NA 142 05/09/2017 0840   K 3.7 08/30/2023 1343   K 4.1 05/09/2017 0840   CL 103 08/30/2023 1343   CO2 29 08/30/2023 1343   CO2 28 05/09/2017 0840   GLUCOSE 90 08/30/2023 1343   GLUCOSE 61 (L) 05/09/2017 0840   BUN 15 08/30/2023 1343   BUN 17 02/22/2023 1225   BUN 14.4 05/09/2017 0840   CREATININE 0.90 08/30/2023 1343   CREATININE 0.9 05/09/2017 0840   CALCIUM  9.5 08/30/2023 1343   CALCIUM  9.3  05/09/2017 0840   PROT 7.2 08/30/2023 1343   PROT 6.6 01/07/2022 1059   PROT 6.3 (L) 05/09/2017 0840   ALBUMIN 4.3 08/30/2023 1343   ALBUMIN 4.1 01/07/2022 1059   ALBUMIN 3.4 (L) 05/09/2017 0840  AST 26 08/30/2023 1343   AST 20 05/09/2017 0840   ALT 13 08/30/2023 1343   ALT 21 05/09/2017 0840   ALKPHOS 50 08/30/2023 1343   ALKPHOS 81 05/09/2017 0840   BILITOT 0.7 08/30/2023 1343   BILITOT 0.9 01/07/2022 1059   BILITOT 0.74 05/09/2017 0840   GFRNONAA >60 08/30/2023 1343   GFRAA 55 (L) 01/18/2020 0900   Lab Results  Component Value Date   WBC 3.7 (L) 08/30/2023   NEUTROABS 1.8 08/30/2023   HGB 13.0 08/30/2023   HCT 39.7 08/30/2023   MCV 88.6 08/30/2023   PLT 158 08/30/2023    Assessment/Plan Peripheral neuropathy due to chemotherapy (HCC)  Carolyn Newman presents with clinical syndrome consistent with symmetric, length dependent, small and large fiber peripheral neuropathy.  Etiology is exposure to velcade  and (secondariliy) pomalyst  chemotherapy.  We reviewed pathophysiology of chemotherapy induced neuropathy, available treatments, and goals of care.  Her B12, A1c have been checked in recent weeks.  She is not a drinker.    She is agreeable with trial of Lyrica 50mg  HS, does not want to dose additional medications during the daytime.  Gabapentin  was not well tolerated previously, led to dizziness.  We spent twenty additional minutes teaching regarding the natural history, biology, and historical experience in the treatment of neurologic complications of cancer.   We appreciate the opportunity to participate in the care of ERIYANNA KOFOED.   We ask that YOKO MCGAHEE return to clinic in 2 months, or sooner as needed.  All questions were answered. The patient knows to call the clinic with any problems, questions or concerns. No barriers to learning were detected.  The total time spent in the encounter was 40 minutes and more than 50% was on counseling and review of  test results   Arthea MARLA Manns, MD Medical Director of Neuro-Oncology [facility] 11/08/23 12:38 PM

## 2023-11-15 ENCOUNTER — Ambulatory Visit (INDEPENDENT_AMBULATORY_CARE_PROVIDER_SITE_OTHER): Admitting: Family Medicine

## 2023-11-15 ENCOUNTER — Encounter (INDEPENDENT_AMBULATORY_CARE_PROVIDER_SITE_OTHER): Payer: Self-pay | Admitting: Adult Health

## 2023-11-15 ENCOUNTER — Ambulatory Visit (INDEPENDENT_AMBULATORY_CARE_PROVIDER_SITE_OTHER): Admitting: Adult Health

## 2023-11-15 VITALS — BP 103/72 | HR 65 | Temp 97.9°F | Ht 68.0 in | Wt 222.0 lb

## 2023-11-15 DIAGNOSIS — Z6833 Body mass index (BMI) 33.0-33.9, adult: Secondary | ICD-10-CM

## 2023-11-15 DIAGNOSIS — E785 Hyperlipidemia, unspecified: Secondary | ICD-10-CM

## 2023-11-15 DIAGNOSIS — R7303 Prediabetes: Secondary | ICD-10-CM | POA: Diagnosis not present

## 2023-11-15 DIAGNOSIS — E669 Obesity, unspecified: Secondary | ICD-10-CM | POA: Diagnosis not present

## 2023-11-15 DIAGNOSIS — N1831 Chronic kidney disease, stage 3a: Secondary | ICD-10-CM

## 2023-11-15 DIAGNOSIS — Z6834 Body mass index (BMI) 34.0-34.9, adult: Secondary | ICD-10-CM

## 2023-11-15 IMAGING — CT CTA CHEST W/ AND/OR W/O CM W/ OR W/O DISSECTION AND GATING
2 of 6 series · 4 of 16 positions shown, 5 images · IV contrast (OMNI 350)
Comparison: 09/11/2020; 08/14/2019; 07/24/2018; 05/18/2018

CLINICAL DATA: Follow-up thoracic aortic aneurysm

EXAM:
CT ANGIOGRAPHY CHEST WITH CONTRAST
TECHNIQUE: Multidetector CT imaging of the chest was performed using the
standard protocol during bolus administration of intravenous
contrast. Multiplanar CT image reconstructions and MIPs were
obtained to evaluate the vascular anatomy.

[Series 6: ax arterial · axial · arterial · 0.94mm/px · z∈[-241,+43]mm · 3 of 143 slices shown, 4 images]
[im 1/143  soft-tissue]
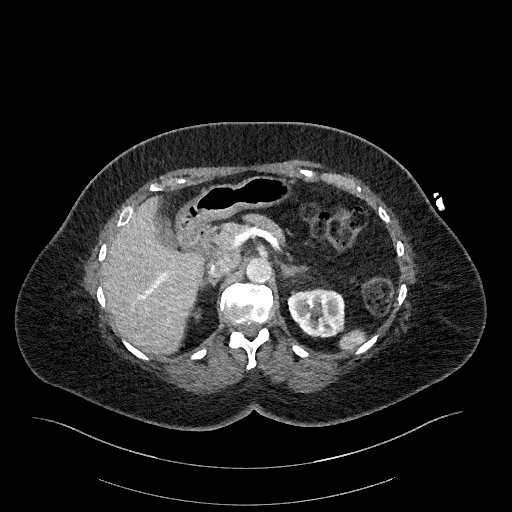
[im 1/143  bone]
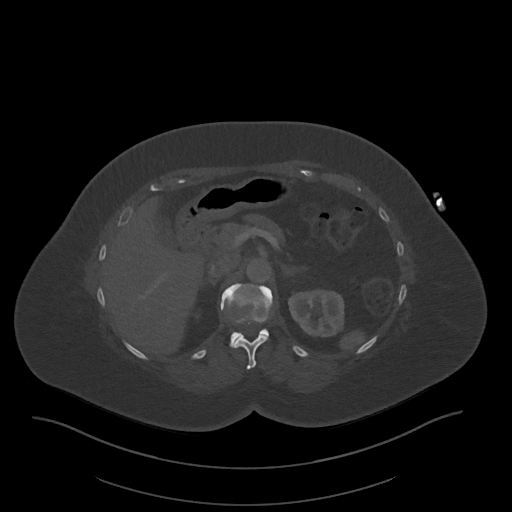
[im 72/143  soft-tissue]
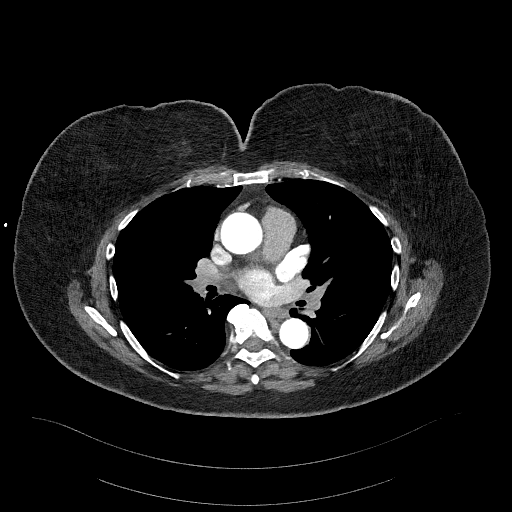
[im 143/143  soft-tissue]
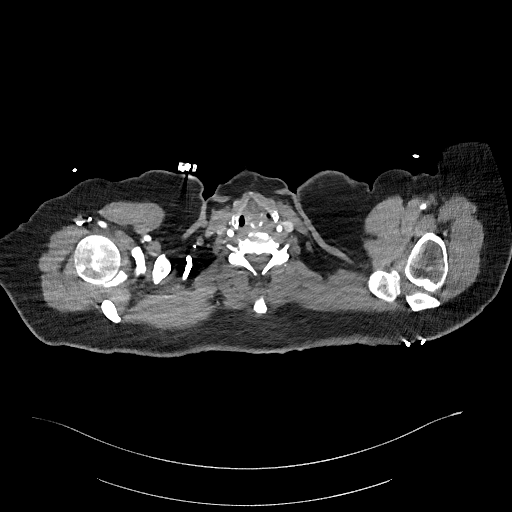

[Series 10: sagital · sagittal · 0.56mm/px · 1 of 232 slices shown]
[im 116/232  soft-tissue]
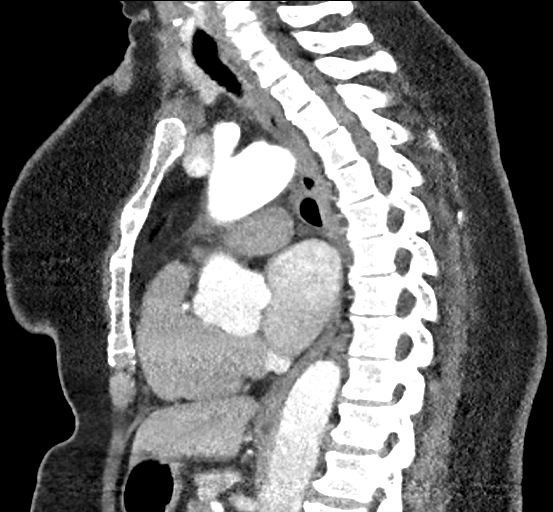

[4 of 16 positions shown; findings below may reference images not displayed]

RADIATION DOSE REDUCTION: This exam was performed according to the
departmental dose-optimization program which includes automated
exposure control, adjustment of the mA and/or kV according to
patient size and/or use of iterative reconstruction technique.

CONTRAST:  70mL OMNIPAQUE IOHEXOL 350 MG/ML SOLN
FINDINGS: Vascular Findings:

Stable mild fusiform aneurysmal dilatation of the ascending thoracic
aorta with measurements as follows. The thoracic aorta tapers to a
normal caliber at the level of the aortic arch. The descending
thoracic aorta is mildly tortuous but of normal caliber. No evidence
of thoracic aortic dissection or perivascular stranding on this
nongated examination.

There is a minimal amount of atherosclerotic plaque involving the
aortic arch, not resulting in a hemodynamically significant
stenosis.

Bovine configuration of the aortic arch. The branch vessels of the
aortic arch appear widely patent throughout their imaged courses.

Cardiomegaly. Coronary artery calcifications. No pericardial
effusion.

Although this examination was not tailored for the evaluation the
pulmonary arteries, there are no discrete filling defects within the
central pulmonary arterial tree to suggest central pulmonary
embolism. Apparent filling defect within the SVC is artifactual due
to mixing of unopacified blood from azygous vein. Normal caliber of
the main pulmonary artery.

-------------------------------------------------------------

Thoracic aortic measurements:

SINOTUBULAR JUNCTION: 35 mm as measured in greatest oblique short
axis coronal dimension.

PROXIMAL ASCENDING THORACIC AORTA: 40 mm as measured in greatest
oblique short axis axial dimension (axial image 68, series 6) at the
level of the main pulmonary artery and approximately 39 mm as
measured in greatest oblique short axis coronal dimension (coronal
image 61, series 9) unchanged compared to chest CT performed [DATE]
by my direct remeasurement.

AORTIC ARCH: 32 mm as measured in greatest oblique short axis
sagittal dimension.

PROXIMAL DESCENDING THORACIC AORTA: 28 mm as measured in greatest
oblique short axis axial dimension at the level of the main
pulmonary artery.

DISTAL DESCENDING THORACIC AORTA: 26 mm as measured in greatest
oblique short axis axial dimension at the level of the diaphragmatic
hiatus.

Review of the MIP images confirms the above findings.

-------------------------------------------------------------

Non-Vascular Findings:

Mediastinum/Lymph Nodes: No bulky mediastinal, hilar or axillary
lymphadenopathy.

Lungs/Pleura: No focal airspace opacities. No pleural effusion or
pneumothorax. No discrete pulmonary nodules.

Upper abdomen: Limited early arterial phase evaluation of the upper
abdomen demonstrates mild thickening the left adrenal gland without
discrete nodule. Moderate to large colonic stool burden.

Musculoskeletal: No acute or aggressive osseous abnormalities.
Stigmata of dish within mid and caudal aspects of the thoracic
spine. Regional soft tissues appear normal. Normal appearance of the
thyroid gland.
IMPRESSION: 1. Stable uncomplicated fusiform aneurysmal dilatation of the
ascending thoracic aorta measuring 40 mm in diameter, unchanged
compared the [DATE] examination. Recommend annual imaging followup
by CTA or MRA. This recommendation follows 5353
ACCF/AHA/AATS/ACR/ASA/SCA/ANTOINE/TAKARUVA/JIM/NOMASIBULELE Guidelines for the
Diagnosis and Management of Patients with Thoracic Aortic Disease.
Circulation. 5353; 121: [id number]. Aortic aneurysm NOS (ARCCD-H8U.V)
2. Coronary artery calcifications. Aortic Atherosclerosis
(ARCCD-2Q0.0).

## 2023-11-15 MED ORDER — WEGOVY 1.7 MG/0.75ML ~~LOC~~ SOAJ: 1.7000 mg | SUBCUTANEOUS | 0 refills | Status: DC

## 2023-11-15 NOTE — Progress Notes (Signed)
 WEIGHT SUMMARY AND BIOMETRICS  Vitals Temp: 97.9 F (36.6 C) BP: 103/72 Pulse Rate: 65 SpO2: 99 %   Anthropometric Measurements Height: 5' 8 (1.727 m) Weight: 222 lb (100.7 kg) BMI (Calculated): 33.76 Weight at Last Visit: 224lb Weight Lost Since Last Visit: 2lb Weight Gained Since Last Visit: 0 Starting Weight: 268lb Total Weight Loss (lbs): 46 lb (20.9 kg) Peak Weight: 302lb   Body Composition  Body Fat %: 46.7 % Fat Mass (lbs): 103.8 lbs Muscle Mass (lbs): 112.4 lbs Total Body Water (lbs): 81.8 lbs Visceral Fat Rating : 14   Other Clinical Data Fasting: no Labs: no Today's Visit #: 22 Starting Date: 01/07/22    Chief Complaint:   OBESITY Carolyn Newman is here to discuss her progress with her obesity treatment plan.  She is on the the Category 2 Plan and states she is following her eating plan approximately 60 % of the time.  She states she is exercising Stretching 30-60 minutes 3 times per week.  Interim History:  Started on Wegovy  0.25mg  on/about 12/29/2022 Wegovy  increased from 0.25mg  to 0.5mg  on/about 04/12/2023 Wegovy  increased from 0.5mg  to 1mg  on/about 07/18/2023 Denies mass in neck, dysphagia, dyspepsia, persistent hoarseness, abdominal pain, or N/V/C  She reports breakthrough polyphagia- discussed risks/benefits of increasing Wegovy  from 1mg  to 1.7mg   She will travel to [place] and [place], [place] this month- she travels with protein bars and fruit  Subjective:   1. Chronic kidney disease, stage 3a (HCC)  Latest Reference Range & Units 03/01/23 14:27 05/31/23 09:19 08/30/23 13:43  GFR, Estimated >60 mL/min 57 (L) 57 (L) >60  (L): Data is abnormally low  Latest Reference Range & Units 03/01/23 14:27 05/31/23 09:19 08/30/23 13:43  Creatinine 0.44 - 1.00 mg/dL 8.94 (H) 8.94 (H) 9.09  (H): Data is abnormally high  BP well controlled at OV She is on  losartan -hydrochlorothiazide (HYZAAR ) 100-25 MG tablet   2. Hyperlipidemia, unspecified  hyperlipidemia type Lipid Panel     Component Value Date/Time   CHOL 159 01/07/2022 1059   TRIG 59 01/07/2022 1059   HDL 72 01/07/2022 1059   CHOLHDL 2.2 01/07/2022 1059   LDLCALC 75 01/07/2022 1059   LABVLDL 12 01/07/2022 1059    Cards/Dr. Court manages daily Crestor  20mg   3. Prediabetes Lab Results  Component Value Date   HGBA1C 5.7 (H) 07/18/2023   HGBA1C 5.9 (H) 01/07/2022   HGBA1C 5.6 11/27/2019    Started on Wegovy  0.25mg  on/about 12/29/2022 Wegovy  increased from 0.25mg  to 0.5mg  on/about 04/12/2023 Wegovy  increased from 0.5mg  to 1mg  on/about 07/18/2023 Denies mass in neck, dysphagia, dyspepsia, persistent hoarseness, abdominal pain, or N/V/C  She reports breakthrough polyphagia- discussed risks/benefits of increasing Wegovy  from 1mg  to 1.7mg   Assessment/Plan:   1. Chronic kidney disease, stage 3a (HCC) (Primary) Keep BP well controlled Continue to avoid Nephrotoxic substances  2. Hyperlipidemia, unspecified hyperlipidemia type Continue healthy eating and daily activity  3. Prediabetes Continue healthy eating and daily activity Refill and INCREASE Semaglutide -Weight Management (WEGOVY ) 1.7 MG/0.75ML SOAJ Inject 1.7 mg into the skin once a week. Dispense: 3 mL, Refills: 0 ordered   4. BMI 34.0-34.9,adult Current BMI 33.76 Refill and INCREASE Semaglutide -Weight Management (WEGOVY ) 1.7 MG/0.75ML SOAJ Inject 1.7 mg into the skin once a week. Dispense: 3 mL, Refills: 0 ordered   Carolyn Newman is currently in the action stage of change. As such, her goal is to continue with weight loss efforts. She has agreed to the Category 2 Plan.   Exercise goals: Older adults  should follow the adult guidelines. When older adults cannot meet the adult guidelines, they should be as physically active as their abilities and conditions will allow.  Older adults should do exercises that maintain or improve balance if they are at risk of falling.  Older adults should determine their level of effort  for physical activity relative to their level of fitness.  Older adults with chronic conditions should understand whether and how their conditions affect their ability to do regular physical activity safely.  Behavioral modification strategies: increasing lean protein intake, decreasing simple carbohydrates, increasing vegetables, increasing water intake, no skipping meals, meal planning and cooking strategies, keeping healthy foods in the home, travel eating strategies, and planning for success.  Carolyn Newman has agreed to follow-up with our clinic in 4 weeks. She was informed of the importance of frequent follow-up visits to maximize her success with intensive lifestyle modifications for her multiple health conditions.   Check Fasting Labs Fall 2025  Objective:   Blood pressure 103/72, pulse 65, temperature 97.9 F (36.6 C), height 5' 8 (1.727 m), weight 222 lb (100.7 kg), SpO2 99%. Body mass index is 33.75 kg/m.  General: Cooperative, alert, well developed, in no acute distress. HEENT: Conjunctivae and lids unremarkable. Cardiovascular: Regular rhythm.  Lungs: Normal work of breathing. Neurologic: No focal deficits.   Lab Results  Component Value Date   CREATININE 0.90 08/30/2023   BUN 15 08/30/2023   NA 137 08/30/2023   K 3.7 08/30/2023   CL 103 08/30/2023   CO2 29 08/30/2023   Lab Results  Component Value Date   ALT 13 08/30/2023   AST 26 08/30/2023   ALKPHOS 50 08/30/2023   BILITOT 0.7 08/30/2023   Lab Results  Component Value Date   HGBA1C 5.7 (H) 07/18/2023   HGBA1C 5.9 (H) 01/07/2022   HGBA1C 5.6 11/27/2019   Lab Results  Component Value Date   INSULIN  9.9 07/18/2023   INSULIN  6.7 01/07/2022   INSULIN  9.2 11/27/2019   Lab Results  Component Value Date   TSH 1.480 01/07/2022   Lab Results  Component Value Date   CHOL 159 01/07/2022   HDL 72 01/07/2022   LDLCALC 75 01/07/2022   TRIG 59 01/07/2022   CHOLHDL 2.2 01/07/2022   Lab Results  Component Value  Date   VD25OH 60.2 07/18/2023   VD25OH 75.5 01/07/2022   VD25OH 89.78 07/18/2020   Lab Results  Component Value Date   WBC 3.7 (L) 08/30/2023   HGB 13.0 08/30/2023   HCT 39.7 08/30/2023   MCV 88.6 08/30/2023   PLT 158 08/30/2023   Lab Results  Component Value Date   IRON 108 11/27/2019   TIBC 250 11/27/2019   FERRITIN 99 11/27/2019   Attestation Statements:   Reviewed by clinician on day of visit: allergies, medications, problem list, medical history, surgical history, family history, social history, and previous encounter notes.  I have reviewed the above documentation for accuracy and completeness, and I agree with the above. -  Monique Hefty d. Trinidad Petron, NP-C

## 2023-11-17 ENCOUNTER — Ambulatory Visit (INDEPENDENT_AMBULATORY_CARE_PROVIDER_SITE_OTHER): Admitting: Family Medicine

## 2023-11-21 ENCOUNTER — Encounter: Payer: Self-pay | Admitting: Internal Medicine

## 2023-11-21 ENCOUNTER — Telehealth: Payer: Self-pay | Admitting: Internal Medicine

## 2023-11-21 NOTE — Telephone Encounter (Signed)
 Scheduled appointments per 6/24 los. Called and left a VM with appointment details for the patient.

## 2023-11-30 DIAGNOSIS — M170 Bilateral primary osteoarthritis of knee: Secondary | ICD-10-CM | POA: Diagnosis not present

## 2023-12-09 DIAGNOSIS — R262 Difficulty in walking, not elsewhere classified: Secondary | ICD-10-CM | POA: Diagnosis not present

## 2023-12-09 DIAGNOSIS — M170 Bilateral primary osteoarthritis of knee: Secondary | ICD-10-CM | POA: Diagnosis not present

## 2023-12-09 DIAGNOSIS — M25561 Pain in right knee: Secondary | ICD-10-CM | POA: Diagnosis not present

## 2023-12-09 DIAGNOSIS — M25562 Pain in left knee: Secondary | ICD-10-CM | POA: Diagnosis not present

## 2023-12-14 ENCOUNTER — Encounter (INDEPENDENT_AMBULATORY_CARE_PROVIDER_SITE_OTHER): Payer: Self-pay | Admitting: Adult Health

## 2023-12-14 ENCOUNTER — Ambulatory Visit (INDEPENDENT_AMBULATORY_CARE_PROVIDER_SITE_OTHER): Admitting: Adult Health

## 2023-12-14 VITALS — BP 103/69 | HR 73 | Temp 98.1°F | Ht 68.0 in | Wt 216.0 lb

## 2023-12-14 DIAGNOSIS — Z6833 Body mass index (BMI) 33.0-33.9, adult: Secondary | ICD-10-CM

## 2023-12-14 DIAGNOSIS — R7303 Prediabetes: Secondary | ICD-10-CM

## 2023-12-14 DIAGNOSIS — Z6834 Body mass index (BMI) 34.0-34.9, adult: Secondary | ICD-10-CM

## 2023-12-14 DIAGNOSIS — E785 Hyperlipidemia, unspecified: Secondary | ICD-10-CM

## 2023-12-14 DIAGNOSIS — N1831 Chronic kidney disease, stage 3a: Secondary | ICD-10-CM

## 2023-12-14 MED ORDER — WEGOVY 1.7 MG/0.75ML ~~LOC~~ SOAJ: 1.7000 mg | SUBCUTANEOUS | 0 refills | Status: DC

## 2023-12-14 NOTE — Progress Notes (Signed)
 WEIGHT SUMMARY AND BIOMETRICS  Vitals Temp: 98.1 F (36.7 C) BP: 103/69 Pulse Rate: 73 SpO2: 99 %   Anthropometric Measurements Height: 5' 8 (1.727 m) Weight: 216 lb (98 kg) BMI (Calculated): 32.85 Weight at Last Visit: 222 lb Weight Lost Since Last Visit: 6 lb Weight Gained Since Last Visit: 0 Starting Weight: 268 lb Total Weight Loss (lbs): 52 lb (23.6 kg) Peak Weight: 302 lb   Body Composition  Body Fat %: 45.3 % Fat Mass (lbs): 97.8 lbs Muscle Mass (lbs): 112.2 lbs Total Body Water (lbs): 76.2 lbs Visceral Fat Rating : 13   Other Clinical Data Fasting: no Labs: no Today's Visit #: 23 Starting Date: 01/07/22    Chief Complaint:   OBESITY Carolyn Newman is here to discuss her progress with her obesity treatment plan.  She is on the the Category 2 Plan and states she is following her eating plan approximately 90 % of the time.  She states she is exercising Walking 20-25 minutes 3 times per week.  Interim History:  Wegovy  from 1mg  to 1.7mg  on/about 11/15/2023 She has had 4 doses- tolerating well.  BP soft, yet stable at OV PCP is managing 1/2 TAB losartan -hydrochlorothiazide (HYZAAR ) 100-25 MG tablet  She denies sx's of hypotension  Hydration-she estimates to drink 5-6 bottles of water per day  Reviewed Bioempedence results with pt: Muscle Mass: - 0.2 lb Adipose Mass: - 6 lbs  Of note- she never started Lyrica  over concerns of medication SE  Subjective:   1. Hyperlipidemia, unspecified hyperlipidemia type She is on losartan -hydrochlorothiazide (HYZAAR ) 100-25 MG tablet 1/2 tab daily  rosuvastatin  (CRESTOR ) 20 MG tablet tab daily  Aspirin 81 MG CAPS tab daily  Weekly Wegovy  1.7mg   2. Prediabetes Started on Wegovy  0.25mg  on/about 12/29/2022 Wegovy  increased from 0.25mg  to 0.5mg  on/about 04/12/2023 Wegovy  increased from 0.5mg  to 1mg  on/about 07/18/2023 Denies mass in neck, dysphagia, dyspepsia, persistent hoarseness, abdominal pain, or N/V/C   Wegovy  from 1mg  to 1.7mg  on/about 11/15/2023 She has had 4 doses at increased strength Denies mass in neck, dysphagia, dyspepsia, persistent hoarseness, abdominal pain, or N/V/Worsening Constipation  3. Chronic kidney disease, stage 3a (HCC) BP excellent at OV She denies sx's of hypotension PCP manages 1/2 TAB losartan -hydrochlorothiazide (HYZAAR ) 100-25 MG tablet   Assessment/Plan:   1. Hyperlipidemia, unspecified hyperlipidemia type Check Fasting labs at next OV  2. Prediabetes Check Fasting labs at next OV  3. Chronic kidney disease, stage 3a (HCC) (Primary) Check Fasting labs at next OV  4. BMI 34.0-34.9,adult Current BMI 33.76 Refill Semaglutide -Weight Management (WEGOVY ) 1.7 MG/0.75ML SOAJ Inject 1.7 mg into the skin once a week. Dispense: 3 mL, Refills: 0 ordered   Carolyn Newman is currently in the action stage of change. As such, her goal is to continue with weight loss efforts. She has agreed to the Category 2 Plan.   Exercise goals: Older adults should follow the adult guidelines. When older adults cannot meet the adult guidelines, they should be as physically active as their abilities and conditions will allow.  Older adults should do exercises that maintain or improve balance if they are at risk of falling.  Older adults should determine their level of effort for physical activity relative to their level of fitness.  Older adults with chronic conditions should understand whether and how their conditions affect their ability to do regular physical activity safely.  Behavioral modification strategies: increasing lean protein intake, decreasing simple carbohydrates, increasing vegetables, increasing water intake, no skipping meals, meal planning and  cooking strategies, keeping healthy foods in the home, ways to avoid boredom eating, and planning for success.  Carolyn Newman has agreed to follow-up with our clinic in 4 weeks. She was informed of the importance of frequent follow-up visits to  maximize her success with intensive lifestyle modifications for her multiple health conditions.   Check Fasting Labs at next OV  Objective:   Blood pressure 103/69, pulse 73, temperature 98.1 F (36.7 C), height 5' 8 (1.727 m), weight 216 lb (98 kg), SpO2 99%. Body mass index is 32.84 kg/m.  General: Cooperative, alert, well developed, in no acute distress. HEENT: Conjunctivae and lids unremarkable. Cardiovascular: Regular rhythm.  Lungs: Normal work of breathing. Neurologic: No focal deficits.   Lab Results  Component Value Date   CREATININE 0.90 08/30/2023   BUN 15 08/30/2023   NA 137 08/30/2023   K 3.7 08/30/2023   CL 103 08/30/2023   CO2 29 08/30/2023   Lab Results  Component Value Date   ALT 13 08/30/2023   AST 26 08/30/2023   ALKPHOS 50 08/30/2023   BILITOT 0.7 08/30/2023   Lab Results  Component Value Date   HGBA1C 5.7 (H) 07/18/2023   HGBA1C 5.9 (H) 01/07/2022   HGBA1C 5.6 11/27/2019   Lab Results  Component Value Date   INSULIN  9.9 07/18/2023   INSULIN  6.7 01/07/2022   INSULIN  9.2 11/27/2019   Lab Results  Component Value Date   TSH 1.480 01/07/2022   Lab Results  Component Value Date   CHOL 159 01/07/2022   HDL 72 01/07/2022   LDLCALC 75 01/07/2022   TRIG 59 01/07/2022   CHOLHDL 2.2 01/07/2022   Lab Results  Component Value Date   VD25OH 60.2 07/18/2023   VD25OH 75.5 01/07/2022   VD25OH 89.78 07/18/2020   Lab Results  Component Value Date   WBC 3.7 (L) 08/30/2023   HGB 13.0 08/30/2023   HCT 39.7 08/30/2023   MCV 88.6 08/30/2023   PLT 158 08/30/2023   Lab Results  Component Value Date   IRON 108 11/27/2019   TIBC 250 11/27/2019   FERRITIN 99 11/27/2019   Attestation Statements:   Reviewed by clinician on day of visit: allergies, medications, problem list, medical history, surgical history, family history, social history, and previous encounter notes.  I have reviewed the above documentation for accuracy and completeness, and I  agree with the above. - Lottie Sigman d. Carolyn Kuper, NP-C

## 2023-12-16 DIAGNOSIS — M25562 Pain in left knee: Secondary | ICD-10-CM | POA: Diagnosis not present

## 2023-12-16 DIAGNOSIS — M170 Bilateral primary osteoarthritis of knee: Secondary | ICD-10-CM | POA: Diagnosis not present

## 2023-12-16 DIAGNOSIS — R262 Difficulty in walking, not elsewhere classified: Secondary | ICD-10-CM | POA: Diagnosis not present

## 2023-12-16 DIAGNOSIS — M25561 Pain in right knee: Secondary | ICD-10-CM | POA: Diagnosis not present

## 2023-12-29 DIAGNOSIS — M170 Bilateral primary osteoarthritis of knee: Secondary | ICD-10-CM | POA: Diagnosis not present

## 2023-12-29 DIAGNOSIS — R262 Difficulty in walking, not elsewhere classified: Secondary | ICD-10-CM | POA: Diagnosis not present

## 2023-12-29 DIAGNOSIS — M25562 Pain in left knee: Secondary | ICD-10-CM | POA: Diagnosis not present

## 2023-12-29 DIAGNOSIS — M25561 Pain in right knee: Secondary | ICD-10-CM | POA: Diagnosis not present

## 2024-01-04 ENCOUNTER — Telehealth: Payer: Self-pay | Admitting: Internal Medicine

## 2024-01-04 NOTE — Telephone Encounter (Signed)
 Rescheduled appointment per patients request via incoming call. Talked with the patient and she is aware of the changes made to her upcoming appointment.

## 2024-01-05 ENCOUNTER — Ambulatory Visit (INDEPENDENT_AMBULATORY_CARE_PROVIDER_SITE_OTHER): Admitting: Family Medicine

## 2024-01-05 ENCOUNTER — Encounter (INDEPENDENT_AMBULATORY_CARE_PROVIDER_SITE_OTHER): Payer: Self-pay | Admitting: Family Medicine

## 2024-01-05 VITALS — BP 113/76 | HR 84 | Temp 97.9°F | Ht 68.0 in | Wt 210.0 lb

## 2024-01-05 DIAGNOSIS — Z6831 Body mass index (BMI) 31.0-31.9, adult: Secondary | ICD-10-CM | POA: Diagnosis not present

## 2024-01-05 DIAGNOSIS — Z6832 Body mass index (BMI) 32.0-32.9, adult: Secondary | ICD-10-CM | POA: Insufficient documentation

## 2024-01-05 DIAGNOSIS — I10 Essential (primary) hypertension: Secondary | ICD-10-CM | POA: Diagnosis not present

## 2024-01-05 DIAGNOSIS — E669 Obesity, unspecified: Secondary | ICD-10-CM

## 2024-01-05 DIAGNOSIS — R7303 Prediabetes: Secondary | ICD-10-CM

## 2024-01-05 DIAGNOSIS — E65 Localized adiposity: Secondary | ICD-10-CM

## 2024-01-05 MED ORDER — WEGOVY 1.7 MG/0.75ML ~~LOC~~ SOAJ: 1.7000 mg | SUBCUTANEOUS | 0 refills | Status: DC

## 2024-01-05 NOTE — Progress Notes (Signed)
 Carolyn Newman, D.O.  ABFM, ABOM Specializing in Clinical Bariatric Medicine  Office located at: [address] [place], [place]  [address]    Assessment and Plan:   Medications Discontinued During This Encounter  Medication Reason   famotidine (PEPCID) 40 MG tablet      Meds ordered this encounter  Medications   semaglutide -weight management (WEGOVY ) 1.7 MG/0.75ML SOAJ SQ injection    Sig: Inject 1.7 mg into the skin once a week.    Dispense:  3 mL    Refill:  0     Obtain fasting labs next OV   FOR THE DISEASE OF OBESITY:  BMI 31.0-31.9,adult - current BMI 31.94 Obesity, Starting BMI 42.73 Assessment & Plan: Since last office visit on 12/14/2023 patient's muscle mass has decreased by 3 lbs. Fat mass has decreased by 2.4 lbs. Total body water has decreased by 0.8 lbs.  Body fat % has not changed. Counseling done on how various foods will affect these numbers and how to maximize success  Total lbs lost to date: - 58 lbs Total weight loss percentage to date: -21.64 %   Recommended Dietary Goals Carolyn Newman is currently in the action stage of change. As such, her goal is to continue weight management plan.  She has agreed to: continue current plan   Behavioral Intervention We discussed the following today: increasing lean protein intake to established goals and continue to work on implementation of reduced calorie nutritional plan  Additional resources provided today: n/a  Evidence-based interventions for health behavior change were utilized today including the discussion of self monitoring techniques, problem-solving barriers and SMART goal setting techniques.   Regarding patient's less desirable eating habits and patterns, we employed the technique of small changes.   Goal: n/a   Recommended Physical Activity Goals Carolyn Newman has been advised to work up to 300-450 minutes of moderate intensity aerobic activity a week and strengthening exercises 2-3 times per  week for cardiovascular health, weight loss maintenance and preservation of muscle mass.   She was encouraged to walk 10,000 steps daily +  start strengthening exercises with a goal of 30+ minutes, 2 days a week.   Pharmacotherapy She has been on the 1.7 mg wkly Wegovy  dose since July 1st or so. She occasionally has abdominal pain from the Wegovy  but states its manageable. She also felt nauseated from it one time when she was on the road. She has good control of hunger, cravings, and food noise and desires to continue at the current dose of Wegovy ; will refill today.    ASSOCIATED CONDITIONS ADDRESSED TODAY:   Prediabetes Assessment & Plan: Lab Results  Component Value Date   HGBA1C 5.7 (H) 07/18/2023   HGBA1C 5.9 (H) 01/07/2022   HGBA1C 5.6 11/27/2019   INSULIN  9.9 07/18/2023   INSULIN  6.7 01/07/2022   INSULIN  9.2 11/27/2019    Pre-DM managed with dietary and lifestyle interventions. Pt feels her carbohydrate cravings, specifically for sweets, has been well controlled. She will continue working on nutrition plan to decrease simple carbohydrates, increase lean proteins and exercise to promote weight loss and improve glycemic control and prevent progression to T2DM.    Essential hypertension Assessment & Plan: Last 3 blood pressure readings in our office are as follows: BP Readings from Last 3 Encounters:  01/05/24 113/76  12/14/23 103/69  11/15/23 103/72   The 10-year ASCVD risk score (Arnett DK, et al., 2019) is: 20.8%  Lab Results  Component Value Date   CREATININE 0.90 08/30/2023  BP is at goal today. Cont Hyzaar  100-25 mg 0.5 tablet daily and low sodium diet. She was encouraged to add strength training exercises 2-3 times per week.     Visceral obesity Assessment & Plan: Current visceral fat rating: 13.  The visceral fat rating should be < 12 in a female.  Visceral adipose tissue is a hormonally active component of total body fat. This body composition phenotype  is associated with medical disorders such as metabolic syndrome, MASLD, cardiovascular disease and several malignancies including prostate, breast, and colorectal cancers. Goal: Lose 5-10% of weight via prudent nutritional plan and lifestyle changes.     Follow up:   Return 02/06/2024 at 11:00 AM with Danford, Rockie BIRCH, NP.  She was informed of the importance of frequent follow up visits to maximize her success with intensive lifestyle modifications for her multiple health conditions.   Subjective:    Chief complaint: Obesity Carolyn Newman is here to discuss her progress with her obesity treatment plan. She is on the Category 2 Plan and states she is following her eating plan approximately 75% of the time. She states she is walking 30 minutes 2 days per week.   Interval History:  This is my first encounter with Carolyn Newman. She typically see's my colleague Rockie Dalton NP. Since last OV on 12/14/2023 , Carolyn Newman is down 6 lbs. States she is getting in her lean proteins in the form of chicken, shrimp, or beef. She is also focusing on her fruits and veggies. She drinks around 100 ounces of water daily.    Pharmacotherapy that aid with weight loss: She is currently taking Wegovy  1.7 mg wkly.   Review of Systems:  Pertinent positives were addressed with patient today.  Reviewed by clinician on day of visit: allergies, medications, problem list, medical history, surgical history, family history, social history, and previous encounter notes.   Weight Summary and Biometrics   Weight Lost Since Last Visit: 6 lb  Weight Gained Since Last Visit: 0   Vitals Temp: 97.9 F (36.6 C) BP: 113/76 Pulse Rate: 84 SpO2: 97 %   Anthropometric Measurements Height: 5' 8 (1.727 m) Weight: 210 lb (95.3 kg) BMI (Calculated): 31.94 Weight at Last Visit: 216 lb Weight Lost Since Last Visit: 6 lb Weight Gained Since Last Visit: 0 Starting Weight: 268 lb Total Weight Loss (lbs): 58 lb (26.3 kg) Peak  Weight: 302 lb   Body Composition  Body Fat %: 45.3 % Fat Mass (lbs): 95.4 lbs Muscle Mass (lbs): 109.2 lbs Total Body Water (lbs): 75.4 lbs Visceral Fat Rating : 13   Other Clinical Data Fasting: yes Labs: no Today's Visit #: 24 Starting Date: 01/07/22    Objective:   PHYSICAL EXAM: Blood pressure 113/76, pulse 84, temperature 97.9 F (36.6 C), height 5' 8 (1.727 m), weight 210 lb (95.3 kg), SpO2 97%. Body mass index is 31.93 kg/m.  General: she is overweight, cooperative and in no acute distress. PSYCH: Has normal mood, affect and thought process.   HEENT: EOMI, sclerae are anicteric. Lungs: Normal breathing effort, no conversational dyspnea. Extremities: Moves * 4 Neurologic: A and O * 3, good insight  DIAGNOSTIC DATA REVIEWED: BMET    Component Value Date/Time   NA 137 08/30/2023 1343   NA 143 02/22/2023 1225   NA 142 05/09/2017 0840   K 3.7 08/30/2023 1343   K 4.1 05/09/2017 0840   CL 103 08/30/2023 1343   CO2 29 08/30/2023 1343   CO2 28 05/09/2017 0840  GLUCOSE 90 08/30/2023 1343   GLUCOSE 61 (L) 05/09/2017 0840   BUN 15 08/30/2023 1343   BUN 17 02/22/2023 1225   BUN 14.4 05/09/2017 0840   CREATININE 0.90 08/30/2023 1343   CREATININE 0.9 05/09/2017 0840   CALCIUM  9.5 08/30/2023 1343   CALCIUM  9.3 05/09/2017 0840   GFRNONAA >60 08/30/2023 1343   GFRAA 55 (L) 01/18/2020 0900   Lab Results  Component Value Date   HGBA1C 5.7 (H) 07/18/2023   HGBA1C 5.6 11/27/2019   Lab Results  Component Value Date   INSULIN  9.9 07/18/2023   INSULIN  9.2 11/27/2019   Lab Results  Component Value Date   TSH 1.480 01/07/2022   CBC    Component Value Date/Time   WBC 3.7 (L) 08/30/2023 1343   RBC 4.48 08/30/2023 1343   HGB 13.0 08/30/2023 1343   HGB 12.4 11/27/2019 1337   HGB 11.7 05/09/2017 0840   HCT 39.7 08/30/2023 1343   HCT 39.4 11/27/2019 1337   HCT 37.2 05/09/2017 0840   PLT 158 08/30/2023 1343   PLT 228 11/27/2019 1337   MCV 88.6 08/30/2023  1343   MCV 91 11/27/2019 1337   MCV 90.3 05/09/2017 0840   MCH 29.0 08/30/2023 1343   MCHC 32.7 08/30/2023 1343   RDW 14.6 08/30/2023 1343   RDW 14.7 11/27/2019 1337   RDW 18.2 (H) 05/09/2017 0840   Iron Studies    Component Value Date/Time   IRON 108 11/27/2019 1337   TIBC 250 11/27/2019 1337   FERRITIN 99 11/27/2019 1337   IRONPCTSAT 43 11/27/2019 1337   Lipid Panel     Component Value Date/Time   CHOL 159 01/07/2022 1059   TRIG 59 01/07/2022 1059   HDL 72 01/07/2022 1059   CHOLHDL 2.2 01/07/2022 1059   LDLCALC 75 01/07/2022 1059   Hepatic Function Panel     Component Value Date/Time   PROT 7.2 08/30/2023 1343   PROT 6.6 01/07/2022 1059   PROT 6.3 (L) 05/09/2017 0840   ALBUMIN 4.3 08/30/2023 1343   ALBUMIN 4.1 01/07/2022 1059   ALBUMIN 3.4 (L) 05/09/2017 0840   AST 26 08/30/2023 1343   AST 20 05/09/2017 0840   ALT 13 08/30/2023 1343   ALT 21 05/09/2017 0840   ALKPHOS 50 08/30/2023 1343   ALKPHOS 81 05/09/2017 0840   BILITOT 0.7 08/30/2023 1343   BILITOT 0.9 01/07/2022 1059   BILITOT 0.74 05/09/2017 0840      Component Value Date/Time   TSH 1.480 01/07/2022 1059   Nutritional Lab Results  Component Value Date   VD25OH 60.2 07/18/2023   VD25OH 75.5 01/07/2022   VD25OH 89.78 07/18/2020    Attestations:   I, Special Puri, acting as a Stage manager for [employer], DO., have compiled all relevant documentation for today's office visit on behalf of Carolyn Laymon, DO, while in the presence of [employer], DO.  I have reviewed the above documentation for accuracy and completeness, and I agree with the above. Carolyn Newman, D.O.  The 21st Century Cures Act was signed into law in 2016 which includes the topic of electronic health records.  This provides immediate access to information in MyChart.  This includes consultation notes, operative notes, office notes, lab results and pathology reports.  If you have any questions about what you read  please let us  know at your next visit so we can discuss your concerns and take corrective action if need be.  We are right here with you.

## 2024-01-06 DIAGNOSIS — M25561 Pain in right knee: Secondary | ICD-10-CM | POA: Diagnosis not present

## 2024-01-06 DIAGNOSIS — R262 Difficulty in walking, not elsewhere classified: Secondary | ICD-10-CM | POA: Diagnosis not present

## 2024-01-06 DIAGNOSIS — M170 Bilateral primary osteoarthritis of knee: Secondary | ICD-10-CM | POA: Diagnosis not present

## 2024-01-06 DIAGNOSIS — M25562 Pain in left knee: Secondary | ICD-10-CM | POA: Diagnosis not present

## 2024-01-09 ENCOUNTER — Telehealth: Admitting: Internal Medicine

## 2024-01-10 ENCOUNTER — Ambulatory Visit (INDEPENDENT_AMBULATORY_CARE_PROVIDER_SITE_OTHER): Admitting: Family Medicine

## 2024-01-10 ENCOUNTER — Inpatient Hospital Stay: Attending: Hematology and Oncology | Admitting: Internal Medicine

## 2024-01-10 DIAGNOSIS — G620 Drug-induced polyneuropathy: Secondary | ICD-10-CM

## 2024-01-10 DIAGNOSIS — T451X5A Adverse effect of antineoplastic and immunosuppressive drugs, initial encounter: Secondary | ICD-10-CM

## 2024-01-10 NOTE — Progress Notes (Signed)
 I connected with Carolyn Newman on 01/10/24 at  3:00 PM EDT by telephone visit and verified that I am speaking with the correct person using two identifiers.  I discussed the limitations, risks, security and privacy concerns of performing an evaluation and management service by telemedicine and the availability of in-person appointments. I also discussed with the patient that there may be a patient responsible charge related to this service. The patient expressed understanding and agreed to proceed.   Other persons participating in the visit and their role in the encounter:  n/a  Patient's location:  Home Provider's location:  Office Chief Complaint:  Peripheral neuropathy due to chemotherapy [facility])  History of Present Ilness: Carolyn Newman reports no clinical changes today.  She did not begin to dose the Lyrica  for the neuropathy due to concern for possible side effects.  She continues to have some neuropathy symptoms, but milder compared to previous visit.  Observations: Language and cognition at baseline  Assessment and Plan: Peripheral neuropathy due to chemotherapy (HCC)  Clinically stable, she would like to avoid dosing additional medications at this time.  Follow Up Instructions: RTC as needed  I discussed the assessment and treatment plan with the patient.  The patient was provided an opportunity to ask questions and all were answered.  The patient agreed with the plan and demonstrated understanding of the instructions.    The patient was advised to call back or seek an in-person evaluation if the symptoms worsen or if the condition fails to improve as anticipated.    Johnatha Zeidman K Pradyun Ishman, MD   I provided 20 minutes of non face-to-face telephone visit time during this encounter, and > 50% was spent counseling as documented under my assessment & plan.

## 2024-01-12 ENCOUNTER — Ambulatory Visit (INDEPENDENT_AMBULATORY_CARE_PROVIDER_SITE_OTHER): Admitting: Family Medicine

## 2024-01-13 DIAGNOSIS — M25562 Pain in left knee: Secondary | ICD-10-CM | POA: Diagnosis not present

## 2024-01-13 DIAGNOSIS — R262 Difficulty in walking, not elsewhere classified: Secondary | ICD-10-CM | POA: Diagnosis not present

## 2024-01-13 DIAGNOSIS — M25561 Pain in right knee: Secondary | ICD-10-CM | POA: Diagnosis not present

## 2024-01-13 DIAGNOSIS — M170 Bilateral primary osteoarthritis of knee: Secondary | ICD-10-CM | POA: Diagnosis not present

## 2024-01-17 DIAGNOSIS — M170 Bilateral primary osteoarthritis of knee: Secondary | ICD-10-CM | POA: Diagnosis not present

## 2024-01-20 ENCOUNTER — Encounter (INDEPENDENT_AMBULATORY_CARE_PROVIDER_SITE_OTHER): Payer: Self-pay | Admitting: Adult Health

## 2024-02-06 ENCOUNTER — Ambulatory Visit (INDEPENDENT_AMBULATORY_CARE_PROVIDER_SITE_OTHER): Admitting: Adult Health

## 2024-02-08 ENCOUNTER — Ambulatory Visit: Admitting: Cardiovascular Disease

## 2024-02-13 ENCOUNTER — Ambulatory Visit: Attending: Cardiovascular Disease | Admitting: Cardiovascular Disease

## 2024-02-13 ENCOUNTER — Encounter: Payer: Self-pay | Admitting: Cardiovascular Disease

## 2024-02-13 VITALS — BP 108/66 | HR 81 | Ht 68.0 in | Wt 207.0 lb

## 2024-02-13 DIAGNOSIS — R931 Abnormal findings on diagnostic imaging of heart and coronary circulation: Secondary | ICD-10-CM | POA: Insufficient documentation

## 2024-02-13 DIAGNOSIS — I10 Essential (primary) hypertension: Secondary | ICD-10-CM

## 2024-02-13 DIAGNOSIS — E782 Mixed hyperlipidemia: Secondary | ICD-10-CM | POA: Diagnosis not present

## 2024-02-13 DIAGNOSIS — E66811 Obesity, class 1: Secondary | ICD-10-CM | POA: Diagnosis not present

## 2024-02-13 DIAGNOSIS — I7121 Aneurysm of the ascending aorta, without rupture: Secondary | ICD-10-CM | POA: Diagnosis not present

## 2024-02-13 MED ORDER — ROSUVASTATIN CALCIUM 40 MG PO TABS: 40.0000 mg | ORAL_TABLET | Freq: Every day | ORAL | 3 refills | Status: AC

## 2024-02-13 NOTE — Assessment & Plan Note (Signed)
 History of small ascending thoracic aortic aneurysm measuring 40 mm by recent CT performed 03/02/2023.  This has remained stable.  Will repeat chest CTA in 2 years.

## 2024-02-13 NOTE — Assessment & Plan Note (Signed)
 History of essential hypertension blood pressure measured today at 108/66.  She is on losartan /hydrochlorothiazide.

## 2024-02-13 NOTE — Patient Instructions (Signed)
 Medication Instructions:  Your physician has recommended you make the following change in your medication:   -Increase rosuvastatin  (crestor ) to 40mg  once daily at bedtime.  *If you need a refill on your cardiac medications before your next appointment, please call your pharmacy*  Lab Work: Your physician recommends that you return for lab work in: 3 months for FASTING lipid/liver panel  If you have labs (blood work) drawn today and your tests are completely normal, you will receive your results only by: MyChart Message (if you have MyChart) OR A paper copy in the mail If you have any lab test that is abnormal or we need to change your treatment, we will call you to review the results.  Testing/Procedures: Non-Cardiac CT Angiography (CTA) chest, is a special type of CT scan that uses a computer to produce multi-dimensional views of major blood vessels throughout the body. In CT angiography, a contrast material is injected through an IV to help visualize the blood vessels **To do in October 2026**   Follow-Up: At [facility], you and your health needs are our priority.  As part of our continuing mission to provide you with exceptional heart care, our providers are all part of one team.  This team includes your primary Cardiologist (physician) and Advanced Practice Providers or APPs (Physician Assistants and Nurse Practitioners) who all work together to provide you with the care you need, when you need it.  Your next appointment:   12 month(s)  Provider:   Dorn Lesches, MD

## 2024-02-13 NOTE — Assessment & Plan Note (Signed)
 History of morbid obesity having lost 60 pounds since I saw her last secondary to Wegovy .  She is being seen at the health and wellness center at [facility].  She is also exercising.  She feels clinically improved.

## 2024-02-13 NOTE — Assessment & Plan Note (Signed)
 History of hyperlipidemia on rosuvastatin  lipid profile performed 09/26/2023 revealing total cholesterol 166, LDL 90 and HDL 58.  She is not at goal for secondary prevention given her elevated coronary calcium  score.  I am going to increase her rosuvastatin  from 20 to 40 mg a day and we will recheck a fasting lipid liver profile in 3 months.  LDL goal less than 70.

## 2024-02-13 NOTE — Progress Notes (Signed)
 02/13/2024 [name]   [birth year]  [id number]  Primary Physician [name], MD Primary Cardiologist: Dorn JINNY Lesches MD [facility] [place], [place]  HPI:  Carolyn Newman is a 73 y.o.  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  She was referred by Dr. Lonn for cardiovascular evaluation because of a recently documented small thoracic aortic aneurysm on chest CT performed 01/21/2021.  I last saw her in the office 02/22/2023. Her risk factors include mild untreated hyperlipidemia.  She does not smoke.  She is not diabetic.  She is never had a heart attack or stroke.  There is no family history for heart disease.  She denies chest pain or shortness of breath.  She does have multiple myeloma diagnosed in 2017 and had a stem cell transplant and is now in remission.  Because of an upper respiratory tract infection and coughing she was seen in the emergency room on 05/18/2018 and had a chest CT that showed a small thoracic aortic aneurysm measuring 4 cm x 4 cm.   She apparently is cancer free according to her oncologist.  She has noticed some fatigue and dyspnea on exertion but denies chest pain.  Her last CTA performed 08/15/2019 revealed a stable thoracic aortic aneurysm measuring 40 mm.  Coronary calcium  score performed 08/14/2019 was 83 with calcium  primarily in the LAD and RCA.  She was started on a statin drug by her PCP because of her elevated LDL and coronary calcification.  Since I saw her in the office 1 year ago, she continues to do well.  She denies chest pain or shortness of breath.  Chest CTA performed in the evaluation of her small thoracic aortic aneurysm 03/02/2023 revealed this to be 40 mm in maximum caliber which is stable compared to her last chest CT.SABRA  Her most recent lipid profile performed 09/26/2023 revealed total cholesterol 166, LDL of 90 and HDL 58, not at goal for secondary  prevention given her mildly elevated coronary calcium  score.  She has lost 60 pounds since I last saw her on Wegovy  which [facility].  She is also exercising, doing cardio and walking.   Current Meds  Medication Sig   acetaminophen (TYLENOL) 500 MG tablet Take 500 mg by mouth every 6 (six) hours as needed.   Albuterol  Sulfate (PROAIR  RESPICLICK) 108 (90 Base) MCG/ACT AEPB Take 1 puff by mouth daily as needed.   Aspirin 81 MG CAPS ASPIRIN 81 MG   Calcium  Carb-Cholecalciferol (CALCIUM  500 + D) 500-5 MG-MCG TABS Take 1 tablet by mouth daily.   cetirizine (ZYRTEC) 10 MG tablet Take 10 mg by mouth daily.   Chlorphen-PE-Acetaminophen (NOREL AD PO) Norel AD   docusate sodium (COLACE) 100 MG capsule Take 200 mg by mouth daily.   famotidine (PEPCID) 40 MG tablet 1 tab(s) orally 2 times per day; Duration: 90 days   fluticasone (FLONASE ALLERGY RELIEF) 50 MCG/ACT nasal spray Place 1 spray into both nostrils daily as needed.   losartan -hydrochlorothiazide (HYZAAR ) 100-25 MG tablet Take 1 tablet by mouth daily. (Patient taking differently: Take 0.5 tablets by mouth daily. TAKES 1/2 TABLET DAILY)   Multiple Vitamin (MULTIVITAMIN) tablet Take 1 tablet by mouth daily.   Polyethylene Glycol 3350 (MIRALAX PO) Take by mouth as needed.   rosuvastatin  (CRESTOR ) 20 MG tablet Take 1 tablet (20 mg total) by mouth at bedtime.   semaglutide -weight management (  WEGOVY ) 1.7 MG/0.75ML SOAJ SQ injection Inject 1.7 mg into the skin once a week.   TURMERIC PO      Allergies  Allergen Reactions   Revlimid  [Lenalidomide ] Rash    Social History   Socioeconomic History   Marital status: Single    Spouse name: Not on file   Number of children: Not on file   Years of education: Not on file   Highest education level: Not on file  Occupational History   Occupation: Nurse, children's  Tobacco Use   Smoking status: Never   Smokeless tobacco: Never  Vaping Use   Vaping status: Never  Used  Substance and Sexual Activity   Alcohol use: No   Drug use: No   Sexual activity: Not on file  Other Topics Concern   Not on file  Social History Narrative   Right handed    2 cups of tea every other day    Lives at home by herself.    Social Drivers of Corporate investment banker Strain: Not on file  Food Insecurity: No Food Insecurity (11/08/2023)   Hunger Vital Sign    Worried About Running Out of Food in the Last Year: Never true    Ran Out of Food in the Last Year: Never true  Transportation Needs: No Transportation Needs (11/08/2023)   PRAPARE - Administrator, Civil Service (Medical): No    Lack of Transportation (Non-Medical): No  Physical Activity: Not on file  Stress: Not on file  Social Connections: Not on file  Intimate Partner Violence: Not At Risk (11/08/2023)   Humiliation, Afraid, Rape, and Kick questionnaire    Fear of Current or Ex-Partner: No    Emotionally Abused: No    Physically Abused: No    Sexually Abused: No     Review of Systems: General: negative for chills, fever, night sweats or weight changes.  Cardiovascular: negative for chest pain, dyspnea on exertion, edema, orthopnea, palpitations, paroxysmal nocturnal dyspnea or shortness of breath Dermatological: negative for rash Respiratory: negative for cough or wheezing Urologic: negative for hematuria Abdominal: negative for nausea, vomiting, diarrhea, bright red blood per rectum, melena, or hematemesis Neurologic: negative for visual changes, syncope, or dizziness All other systems reviewed and are otherwise negative except as noted above.    Blood pressure 108/66, pulse 81, height 5' 8 (1.727 m), weight 207 lb (93.9 kg), SpO2 97%.  General appearance: alert and no distress Neck: no adenopathy, no carotid bruit, no JVD, supple, symmetrical, trachea midline, and thyroid  not enlarged, symmetric, no tenderness/mass/nodules Lungs: clear to auscultation bilaterally Heart: regular  rate and rhythm, S1, S2 normal, no murmur, click, rub or gallop Extremities: extremities normal, atraumatic, no cyanosis or edema Pulses: 2+ and symmetric Skin: Skin color, texture, turgor normal. No rashes or lesions Neurologic: Grossly normal  EKG EKG Interpretation Date/Time:  Monday February 13 2024 09:40:26 EDT Ventricular Rate:  81 PR Interval:  158 QRS Duration:  84 QT Interval:  372 QTC Calculation: 432 R Axis:   12  Text Interpretation: Normal sinus rhythm Normal ECG When compared with ECG of 22-Feb-2023 11:34, No significant change was found Confirmed by Court Carrier [phone]) on 02/13/2024 9:59:16 AM    ASSESSMENT AND PLAN:   Obesity, Class I, BMI 30-34.9 History of morbid obesity having lost 60 pounds since I saw her last secondary to Wegovy .  She is being seen at the health and wellness center at [facility].  She is also exercising.  She  feels clinically improved.  Thoracic aortic aneurysm without rupture History of small ascending thoracic aortic aneurysm measuring 40 mm by recent CT performed 03/02/2023.  This has remained stable.  Will repeat chest CTA in 2 years.  Hyperlipidemia History of hyperlipidemia on rosuvastatin  lipid profile performed 09/26/2023 revealing total cholesterol 166, LDL 90 and HDL 58.  She is not at goal for secondary prevention given her elevated coronary calcium  score.  I am going to increase her rosuvastatin  from 20 to 40 mg a day and we will recheck a fasting lipid liver profile in 3 months.  LDL goal less than 70.  Essential hypertension History of essential hypertension blood pressure measured today at 108/66.  She is on losartan /hydrochlorothiazide.  Elevated coronary artery calcium  score Coronary calcium  score performed 08/14/2019 revealed score of 83 with calcium  principally in the LAD and RCA.  She is completely asymptomatic however she is not at goal for secondary prevention.  I am going to adjust her statin drug.     Dorn DOROTHA Lesches MD FACP,FACC,FAHA, [facility] 02/13/2024 10:09 AM

## 2024-02-13 NOTE — Assessment & Plan Note (Signed)
 Coronary calcium  score performed 08/14/2019 revealed score of 83 with calcium  principally in the LAD and RCA.  She is completely asymptomatic however she is not at goal for secondary prevention.  I am going to adjust her statin drug.

## 2024-02-13 NOTE — Progress Notes (Signed)
 Signed             02/22/2023 [name]   Jul 20, 1950  [id number]   Primary Physician [name], MD Primary Cardiologist: Dorn JINNY Lesches MD [facility] [place], [place]   HPI:  Carolyn Newman is a 73 y.o.  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  She was referred by Dr. Lonn for cardiovascular evaluation because of a recently documented small thoracic aortic aneurysm on chest CT performed 01/21/2021.  I last saw her in the office 07/30/2020. Her risk factors include mild untreated hyperlipidemia.  She does not smoke.  She is not diabetic.  She is never had a heart attack or stroke.  There is no family history for heart disease.  She denies chest pain or shortness of breath.  She does have multiple myeloma diagnosed in 2017 and had a stem cell transplant and is now in remission.  Because of an upper respiratory tract infection and coughing she was seen in the emergency room on 05/18/2018 and had a chest CT that showed a small thoracic aortic aneurysm measuring 4 cm x 4 cm.   She apparently is cancer free according to her oncologist.  She has noticed some fatigue and dyspnea on exertion but denies chest pain.  Her last CTA performed 08/15/2019 revealed a stable thoracic aortic aneurysm measuring 40 mm.  Coronary calcium  score performed 08/14/2019 was 83 with calcium  primarily in the LAD and RCA.  She was started on a statin drug by her PCP because of her elevated LDL and coronary calcification.  Since I saw her in the office 1 year ago, she continues to do well.  She denies chest pain or shortness of breath.  Chest CTA performed in the evaluation of her small thoracic aortic aneurysm 10/05/2021 revealed this to be 40 mm in maximum caliber.  Her most recent lipid profile however did reveal an LDL of 105, not at goal for secondary prevention given her mildly elevated coronary calcium  score.      Active Medications      Current Meds  Medication Sig   acetaminophen (TYLENOL) 500 MG tablet Take 500 mg by mouth every 6 (six) hours as needed.   albuterol  (VENTOLIN  HFA) 108 (90 Base) MCG/ACT inhaler Inhale 2 puffs into the lungs every 4 (four) hours as needed for wheezing or shortness of breath.   ASPIRIN 81 PO Take 81 mg by mouth daily.   benzonatate  (TESSALON ) 100 MG capsule Take 1 capsule (100 mg total) by mouth 3 (three) times daily as needed for cough.   Chlorphen-PE-Acetaminophen (NOREL AD) 4-10-325 MG TABS Take by mouth.   ciclopirox  (PENLAC ) 8 % solution Apply topically at bedtime. Apply over nail and surrounding skin. Apply daily over previous coat. After seven (7) days, may remove with alcohol and continue cycle.   docusate sodium (COLACE) 100 MG capsule Take 200 mg by mouth daily.   famotidine (PEPCID) 40 MG tablet Take 40 mg by mouth 2 (two) times daily.   fluticasone (FLONASE) 50 MCG/ACT nasal spray Place into both nostrils daily as needed for allergies or rhinitis.   losartan -hydrochlorothiazide (HYZAAR ) 100-25 MG tablet Take 1 tablet by mouth daily. (Patient taking differently: Take 0.5 tablets by mouth daily. TAKES 1/2 TABLET DAILY)   montelukast (SINGULAIR) 10 MG tablet     Multiple Vitamin (MULTIVITAMIN) tablet Take 1 tablet by mouth daily.   Polyethylene Glycol 3350 (MIRALAX  PO) Take by mouth as needed.   pseudoephedrine (SUDAFED) 120 MG 12 hr tablet Take 120 mg by mouth 2 (two) times daily.   rosuvastatin  (CRESTOR ) 10 MG tablet Take 10 mg by mouth as directed. One tablet before bedtime.   Semaglutide -Weight Management (WEGOVY ) 0.5 MG/0.5ML SOAJ Inject 0.5 mg into the skin once a week.        Allergies      Allergies  Allergen Reactions   Revlimid  [Lenalidomide ] Rash        Social History         Socioeconomic History   Marital status: Single      Spouse name: Not on file   Number of children: Not on file   Years of education: Not on file   Highest  education level: Not on file  Occupational History   Occupation: Nurse, children's  Tobacco Use   Smoking status: Never   Smokeless tobacco: Never  Vaping Use   Vaping status: Never Used  Substance and Sexual Activity   Alcohol use: No   Drug use: No   Sexual activity: Not on file  Other Topics Concern   Not on file  Social History Narrative    Right handed     2 cups of tea every other day     Lives at home by herself.     Social Determinants of Health    Financial Resource Strain: Not on file  Food Insecurity: Not on file  Transportation Needs: Not on file  Physical Activity: Not on file  Stress: Not on file  Social Connections: Not on file  Intimate Partner Violence: Not on file      Review of Systems: General: negative for chills, fever, night sweats or weight changes.  Cardiovascular: negative for chest pain, dyspnea on exertion, edema, orthopnea, palpitations, paroxysmal nocturnal dyspnea or shortness of breath Dermatological: negative for rash Respiratory: negative for cough or wheezing Urologic: negative for hematuria Abdominal: negative for nausea, vomiting, diarrhea, bright red blood per rectum, melena, or hematemesis Neurologic: negative for visual changes, syncope, or dizziness All other systems reviewed and are otherwise negative except as noted above.       Blood pressure 122/78, pulse (!) 55, height 5' 8 (1.727 m), weight 266 lb (120.7 kg).  General appearance: alert and no distress Neck: no adenopathy, no carotid bruit, no JVD, supple, symmetrical, trachea midline, and thyroid  not enlarged, symmetric, no tenderness/mass/nodules Lungs: clear to auscultation bilaterally Heart: regular rate and rhythm, S1, S2 normal, no murmur, click, rub or gallop Extremities: extremities normal, atraumatic, no cyanosis or edema Pulses: 2+ and symmetric Skin: Skin color, texture, turgor normal. No rashes or lesions Neurologic: Grossly normal   EKG EKG  Interpretation Date/Time:                  Tuesday February 22 2023 11:34:01 EDT Ventricular Rate:         55 PR Interval:                 200 QRS Duration:             88 QT Interval:                 444 QTC Calculation:424 R Axis:                         6   Text Interpretation:Sinus bradycardia When compared with ECG of 30-Mar-2022 14:49, No significant change was found  Confirmed by Court Carrier [id number]) on 02/22/2023 11:44:53 AM     ASSESSMENT AND PLAN:    Thoracic aortic aneurysm without rupture (HCC) History of small thoracic aortic aneurysm measuring 40 mm by CTA performed 10/06/2021.  This will be repeated.   Hyperlipidemia History of lipidemia on rosuvastatin  10 mg a day with lipid profile performed 04/05/2022 revealing a total cholesterol 180, LDL 105 and HDL 58, not at goal for secondary prevention given her elevated coronary calcium  score of 83.  I am going to increase this from 10 to 20 mg a day and we will recheck a lipid liver profile in 3 months.         Carrier DOROTHA Court MD FACP,FACC,FAHA, [facility] 02/22/2023 11:53 AM        Electronically signed by Court Carrier PARAS, MD at 02/22/2023  1:24 PM     Office Visit on 02/22/2023       Detailed Report      Note shared with patient Additional Documentation  Vitals: BP 122/78 (BP Location: Left Arm, Patient Position: Sitting, Cuff Size: Large)   Pulse 55 Important    Ht 5' 8 (1.727 m)   Wt 266 lb (120.7 kg)   BMI 40.45 kg/m   BSA 2.41 m      More Vitals  Flowsheets: Anthropometrics,   NEWS,   MEWS Score,   Vital Signs,   Data  Encounter Info: Billing Info,   History,   Allergies,   Detailed Report   Orders Placed   Basic metabolic panel  Hepatic function panel  Lipid panel  CT ANGIO CHEST AORTA W/CM & OR WO/CM  EKG 12-Lead All Encounter Results Medication Changes   Rosuvastatin  Calcium  20 mg Oral Daily at bedtime Medication List Visit Diagnoses   Thoracic aortic  aneurysm without rupture, unspecified part  Mixed hyperlipidemia Problem List   Current Meds  Medication Sig   acetaminophen (TYLENOL) 500 MG tablet Take 500 mg by mouth every 6 (six) hours as needed.   Albuterol  Sulfate (PROAIR  RESPICLICK) 108 (90 Base) MCG/ACT AEPB Take 1 puff by mouth daily as needed.   Aspirin 81 MG CAPS ASPIRIN 81 MG   Calcium  Carb-Cholecalciferol (CALCIUM  500 + D) 500-5 MG-MCG TABS Take 1 tablet by mouth daily.   cetirizine (ZYRTEC) 10 MG tablet Take 10 mg by mouth daily.   Chlorphen-PE-Acetaminophen (NOREL AD PO) Norel AD   docusate sodium (COLACE) 100 MG capsule Take 200 mg by mouth daily.   famotidine (PEPCID) 40 MG tablet 1 tab(s) orally 2 times per day; Duration: 90 days   fluticasone (FLONASE ALLERGY RELIEF) 50 MCG/ACT nasal spray Place 1 spray into both nostrils daily as needed.   losartan -hydrochlorothiazide (HYZAAR ) 100-25 MG tablet Take 1 tablet by mouth daily. (Patient taking differently: Take 0.5 tablets by mouth daily. TAKES 1/2 TABLET DAILY)   Multiple Vitamin (MULTIVITAMIN) tablet Take 1 tablet by mouth daily.   Polyethylene Glycol 3350 (MIRALAX PO) Take by mouth as needed.   rosuvastatin  (CRESTOR ) 20 MG tablet Take 1 tablet (20 mg total) by mouth at bedtime.   semaglutide -weight management (WEGOVY ) 1.7 MG/0.75ML SOAJ SQ injection Inject 1.7 mg into the skin once a week.   TURMERIC PO      Allergies  Allergen Reactions   Revlimid  [Lenalidomide ] Rash    Social History   Socioeconomic History   Marital status: Single    Spouse name: Not on file   Number of children: Not on file   Years of education: Not on file  Highest education level: Not on file  Occupational History   Occupation: Nurse, children's  Tobacco Use   Smoking status: Never   Smokeless tobacco: Never  Vaping Use   Vaping status: Never Used  Substance and Sexual Activity   Alcohol use: No   Drug use: No   Sexual activity: Not on file  Other Topics Concern   Not  on file  Social History Narrative   Right handed    2 cups of tea every other day    Lives at home by herself.    Social Drivers of Corporate investment banker Strain: Not on file  Food Insecurity: No Food Insecurity (11/08/2023)   Hunger Vital Sign    Worried About Running Out of Food in the Last Year: Never true    Ran Out of Food in the Last Year: Never true  Transportation Needs: No Transportation Needs (11/08/2023)   PRAPARE - Administrator, Civil Service (Medical): No    Lack of Transportation (Non-Medical): No  Physical Activity: Not on file  Stress: Not on file  Social Connections: Not on file  Intimate Partner Violence: Not At Risk (11/08/2023)   Humiliation, Afraid, Rape, and Kick questionnaire    Fear of Current or Ex-Partner: No    Emotionally Abused: No    Physically Abused: No    Sexually Abused: No     Review of Systems: General: negative for chills, fever, night sweats or weight changes.  Cardiovascular: negative for chest pain, dyspnea on exertion, edema, orthopnea, palpitations, paroxysmal nocturnal dyspnea or shortness of breath Dermatological: negative for rash Respiratory: negative for cough or wheezing Urologic: negative for hematuria Abdominal: negative for nausea, vomiting, diarrhea, bright red blood per rectum, melena, or hematemesis Neurologic: negative for visual changes, syncope, or dizziness All other systems reviewed and are otherwise negative except as noted above.    Blood pressure 108/66, pulse 81, height 5' 8 (1.727 m), weight 207 lb (93.9 kg), SpO2 97%.  General appearance: alert and no distress Neck: no adenopathy, no carotid bruit, no JVD, supple, symmetrical, trachea midline, and thyroid  not enlarged, symmetric, no tenderness/mass/nodules Lungs: clear to auscultation bilaterally Heart: regular rate and rhythm, S1, S2 normal, no murmur, click, rub or gallop Extremities: extremities normal, atraumatic, no cyanosis or  edema Pulses: 2+ and symmetric Skin: Skin color, texture, turgor normal. No rashes or lesions Neurologic: Grossly normal  EKG EKG Interpretation Date/Time:  Monday February 13 2024 09:40:26 EDT Ventricular Rate:  81 PR Interval:  158 QRS Duration:  84 QT Interval:  372 QTC Calculation: 432 R Axis:   12  Text Interpretation: Normal sinus rhythm Normal ECG When compared with ECG of 22-Feb-2023 11:34, No significant change was found Confirmed by Court Carrier [id number]) on 02/13/2024 9:59:16 AM    ASSESSMENT AND PLAN:   Obesity, Class I, BMI 30-34.9 History of morbid obesity having lost 60 pounds since I saw her last secondary to Wegovy .  She is being seen at the health and wellness center at [facility] health.  She is also exercising.  She feels clinically improved.  Thoracic aortic aneurysm without rupture History of small ascending thoracic aortic aneurysm measuring 40 mm by recent CT performed 03/02/2023.  This has remained stable.  Will repeat chest CTA in 2 years.  Hyperlipidemia History of hyperlipidemia on rosuvastatin  lipid profile performed 09/26/2023 revealing total cholesterol 166, LDL 90 and HDL 58.  She is not at goal for secondary prevention given her elevated coronary calcium  score.  I am going to increase her rosuvastatin  from 20 to 40 mg a day and we will recheck a fasting lipid liver profile in 3 months.  LDL goal less than 70.  Essential hypertension History of essential hypertension blood pressure measured today at 108/66.  She is on losartan /hydrochlorothiazide.  Elevated coronary artery calcium  score Coronary calcium  score performed 08/14/2019 revealed score of 83 with calcium  principally in the LAD and RCA.  She is completely asymptomatic however she is not at goal for secondary prevention.  I am going to adjust her statin drug.     Dorn DOROTHA Lesches MD FACP,FACC,FAHA, [facility] 02/13/2024 10:08 AM

## 2024-02-14 DIAGNOSIS — R0981 Nasal congestion: Secondary | ICD-10-CM | POA: Diagnosis not present

## 2024-02-14 DIAGNOSIS — R7309 Other abnormal glucose: Secondary | ICD-10-CM | POA: Diagnosis not present

## 2024-02-14 DIAGNOSIS — J301 Allergic rhinitis due to pollen: Secondary | ICD-10-CM | POA: Diagnosis not present

## 2024-02-14 DIAGNOSIS — E785 Hyperlipidemia, unspecified: Secondary | ICD-10-CM | POA: Diagnosis not present

## 2024-02-14 DIAGNOSIS — I10 Essential (primary) hypertension: Secondary | ICD-10-CM | POA: Diagnosis not present

## 2024-02-14 DIAGNOSIS — E559 Vitamin D deficiency, unspecified: Secondary | ICD-10-CM | POA: Diagnosis not present

## 2024-02-15 ENCOUNTER — Encounter (INDEPENDENT_AMBULATORY_CARE_PROVIDER_SITE_OTHER): Payer: Self-pay | Admitting: Adult Health

## 2024-02-15 ENCOUNTER — Ambulatory Visit (INDEPENDENT_AMBULATORY_CARE_PROVIDER_SITE_OTHER): Admitting: Adult Health

## 2024-02-15 VITALS — BP 102/69 | HR 60 | Temp 98.7°F | Ht 68.0 in | Wt 201.0 lb

## 2024-02-15 DIAGNOSIS — I10 Essential (primary) hypertension: Secondary | ICD-10-CM

## 2024-02-15 DIAGNOSIS — I129 Hypertensive chronic kidney disease with stage 1 through stage 4 chronic kidney disease, or unspecified chronic kidney disease: Secondary | ICD-10-CM

## 2024-02-15 DIAGNOSIS — Z6831 Body mass index (BMI) 31.0-31.9, adult: Secondary | ICD-10-CM

## 2024-02-15 DIAGNOSIS — N1831 Chronic kidney disease, stage 3a: Secondary | ICD-10-CM

## 2024-02-15 DIAGNOSIS — R7303 Prediabetes: Secondary | ICD-10-CM

## 2024-02-15 DIAGNOSIS — Z6830 Body mass index (BMI) 30.0-30.9, adult: Secondary | ICD-10-CM | POA: Diagnosis not present

## 2024-02-15 MED ORDER — WEGOVY 1.7 MG/0.75ML ~~LOC~~ SOAJ: 1.7000 mg | SUBCUTANEOUS | 0 refills | Status: DC

## 2024-02-15 NOTE — Progress Notes (Signed)
 WEIGHT SUMMARY AND BIOMETRICS  Vitals Temp: 98.7 F (37.1 C) BP: 102/69 Pulse Rate: 60 SpO2: 98 %   Anthropometric Measurements Height: 5' 8 (1.727 m) Weight: 201 lb (91.2 kg) BMI (Calculated): 30.57 Weight at Last Visit: 210lb Weight Lost Since Last Visit: 9lb Weight Gained Since Last Visit: 0lb Starting Weight: 268lb Total Weight Loss (lbs): 67 lb (30.4 kg) Peak Weight: 302lb   Body Composition  Body Fat %: 44.7 % Fat Mass (lbs): 89.8 lbs Muscle Mass (lbs): 105.6 lbs Total Body Water (lbs): 75 lbs Visceral Fat Rating : 13   Other Clinical Data Fasting: No Labs: no Today's Visit #: 25 Starting Date: 01/07/22    Chief Complaint:   OBESITY Carolyn Newman is here to discuss her progress with her obesity treatment plan.  She is on the the Category 2 Plan and states she is following her eating plan approximately 50 % of the time.  She states she is exercising Stretching 30 minutes 2 times per week.   Interim History:  Started on Wegovy  0.25mg  on/about 12/29/2022  Wegovy  from 1mg  to 1.7mg  on/about 11/15/2023- tolerating well.  She reports significant reduction in overall stress levels as her 72 year old brother has moved out of her home into his own town home. Her brother has been able to establish himself with local providers and has been keeping up with his appt's and life responsivilities. He is cognitively intact and on disability for pronounced lymphedema.  Subjective:   1. Essential hypertension BP soft, yet stable at OV She continues to deny sx's of hypotension. 02/13/2024 Cardiology OV Notes: Essential hypertension History of essential hypertension blood pressure measured today at 108/66.  She is on losartan /hydrochlorothiazide.   Elevated coronary artery calcium  score Coronary calcium  score performed 08/14/2019 revealed score of 83 with calcium  principally in the LAD and RCA.  She is completely asymptomatic however she is not at goal for secondary  prevention.  I am going to adjust her statin drug.    2. Prediabetes Lab Results  Component Value Date   HGBA1C 5.7 (H) 07/18/2023   HGBA1C 5.9 (H) 01/07/2022   HGBA1C 5.6 11/27/2019    She reports stable appetite She has lost > 60 lbs She is currently on weekly Wegovy  1.7 mg Denies mass in neck, dysphagia, dyspepsia, persistent hoarseness, abdominal pain, or N/V/C   3. Chronic kidney disease, stage 3a (HCC)  Latest Reference Range & Units 03/01/23 14:27 05/31/23 09:19 08/30/23 13:43  Creatinine 0.44 - 1.00 mg/dL 8.94 (H) 8.94 (H) 9.09  (H): Data is abnormally high  Latest Reference Range & Units 03/01/23 14:27 05/31/23 09:19 08/30/23 13:43  GFR, Estimated >60 mL/min 57 (L) 57 (L) >60  (L): Data is abnormally low  CKD improving She is on Hyzaar  100/25mg  daily and Wegovy  1.7mg  weekly  Assessment/Plan:   1. Essential hypertension (Primary) Continue healthy eating and regular cardiovascular exercise. Refill semaglutide -weight management (WEGOVY ) 1.7 MG/0.75ML SOAJ SQ injection Inject 1.7 mg into the skin once a week. Dispense: 3 mL, Refills: 0 ordered  Continue current statin therapy per Cards -Increase rosuvastatin  (crestor ) to 40mg  once daily at bedtime.  2. Prediabetes Continue healthy eating and regular cardiovascular exercise. Refill semaglutide -weight management (WEGOVY ) 1.7 MG/0.75ML SOAJ SQ injection Inject 1.7 mg into the skin once a week. Dispense: 3 mL, Refills: 0 ordered    3. Chronic kidney disease, stage 3a (HCC) Refill semaglutide -weight management (WEGOVY ) 1.7 MG/0.75ML SOAJ SQ injection Inject 1.7 mg into the skin once a week. Dispense: 3  mL, Refills: 0 ordered   4. BMI 31.0-31.9,adult - current BMI 30.6 Refill semaglutide -weight management (WEGOVY ) 1.7 MG/0.75ML SOAJ SQ injection Inject 1.7 mg into the skin once a week. Dispense: 3 mL, Refills: 0 ordered   Carolyn Newman is currently in the action stage of change. As such, her goal is to continue with weight  loss efforts. She has agreed to the Category 2 Plan.   Exercise goals: Older adults should follow the adult guidelines. When older adults cannot meet the adult guidelines, they should be as physically active as their abilities and conditions will allow.  Older adults should do exercises that maintain or improve balance if they are at risk of falling.  Older adults should determine their level of effort for physical activity relative to their level of fitness.  Older adults with chronic conditions should understand whether and how their conditions affect their ability to do regular physical activity safely. Stretching  Behavioral modification strategies: increasing lean protein intake, decreasing simple carbohydrates, increasing vegetables, increasing water intake, decreasing eating out, no skipping meals, meal planning and cooking strategies, keeping healthy foods in the home, ways to avoid boredom eating, planning for success, and decreasing junk food.  Carolyn Newman has agreed to follow-up with our clinic in 4 weeks. She was informed of the importance of frequent follow-up visits to maximize her success with intensive lifestyle modifications for her multiple health conditions.   Objective:   Blood pressure 102/69, pulse 60, temperature 98.7 F (37.1 C), height 5' 8 (1.727 m), weight 201 lb (91.2 kg), SpO2 98%. Body mass index is 30.56 kg/m.  General: Cooperative, alert, well developed, in no acute distress. HEENT: Conjunctivae and lids unremarkable. Cardiovascular: Regular rhythm.  Lungs: Normal work of breathing. Neurologic: No focal deficits.   Lab Results  Component Value Date   CREATININE 0.90 08/30/2023   BUN 15 08/30/2023   NA 137 08/30/2023   K 3.7 08/30/2023   CL 103 08/30/2023   CO2 29 08/30/2023   Lab Results  Component Value Date   ALT 13 08/30/2023   AST 26 08/30/2023   ALKPHOS 50 08/30/2023   BILITOT 0.7 08/30/2023   Lab Results  Component Value Date   HGBA1C 5.7 (H)  07/18/2023   HGBA1C 5.9 (H) 01/07/2022   HGBA1C 5.6 11/27/2019   Lab Results  Component Value Date   INSULIN  9.9 07/18/2023   INSULIN  6.7 01/07/2022   INSULIN  9.2 11/27/2019   Lab Results  Component Value Date   TSH 1.480 01/07/2022   Lab Results  Component Value Date   CHOL 159 01/07/2022   HDL 72 01/07/2022   LDLCALC 75 01/07/2022   TRIG 59 01/07/2022   CHOLHDL 2.2 01/07/2022   Lab Results  Component Value Date   VD25OH 60.2 07/18/2023   VD25OH 75.5 01/07/2022   VD25OH 89.78 07/18/2020   Lab Results  Component Value Date   WBC 3.7 (L) 08/30/2023   HGB 13.0 08/30/2023   HCT 39.7 08/30/2023   MCV 88.6 08/30/2023   PLT 158 08/30/2023   Lab Results  Component Value Date   IRON 108 11/27/2019   TIBC 250 11/27/2019   FERRITIN 99 11/27/2019   Attestation Statements:   Reviewed by clinician on day of visit: allergies, medications, problem list, medical history, surgical history, family history, social history, and previous encounter notes.  I have reviewed the above documentation for accuracy and completeness, and I agree with the above. -  Shelbi Vaccaro d. Maigan Bittinger, NP-C

## 2024-02-19 ENCOUNTER — Ambulatory Visit: Admission: EM | Admit: 2024-02-19 | Discharge: 2024-02-19 | Disposition: A | Discharge status: Home / Self Care

## 2024-02-19 ENCOUNTER — Other Ambulatory Visit: Payer: Self-pay

## 2024-02-19 VITALS — BP 131/86 | HR 74 | Temp 97.7°F | Resp 20

## 2024-02-19 DIAGNOSIS — J206 Acute bronchitis due to rhinovirus: Secondary | ICD-10-CM

## 2024-02-19 MED ORDER — METHYLPREDNISOLONE 4 MG PO TBPK: ORAL_TABLET | ORAL | 0 refills | Status: DC

## 2024-02-19 MED ORDER — PROMETHAZINE-DM 6.25-15 MG/5ML PO SYRP: 10.0000 mL | ORAL_SOLUTION | Freq: Four times a day (QID) | ORAL | 0 refills | Status: AC | PRN

## 2024-02-19 MED ORDER — IPRATROPIUM-ALBUTEROL 0.5-2.5 (3) MG/3ML IN SOLN
3.0000 mL | Freq: Once | RESPIRATORY_TRACT | Status: AC
  Administered 2024-02-19: 3 mL via RESPIRATORY_TRACT

## 2024-02-19 MED ORDER — MUCINEX DM MAXIMUM STRENGTH 60-1200 MG PO TB12: 1.0000 | ORAL_TABLET | Freq: Two times a day (BID) | ORAL | 0 refills | Status: DC

## 2024-02-19 NOTE — ED Triage Notes (Signed)
 Monday pt had nasal congestion. She saw her PCP on Wednesday and was prescribed azelastine/fluticasone nasal spray and allegra-D. She began coughing on Thursday. Reports minimal relief with meds. Concerned for the cough - states hard to work since it is so frequent. States it's seasonal and she gets this every year.

## 2024-02-19 NOTE — ED Provider Notes (Signed)
 [facility]    CSN: [id number] Arrival date & time: 02/19/24  9164      History   Chief Complaint Chief Complaint  Patient presents with   Nasal Congestion   Cough    HPI Carolyn Newman is a 73 y.o. female.   Discussed the use of AI scribe software for clinical note transcription with the patient, who gave verbal consent to proceed.   The patient presents with nasal congestion and cough that began approximately one week ago. She was evaluated by her primary care provider two days after symptom onset and was prescribed a nasal spray combination of Astelin and Flonase along with Allegra-D for congestion. The following day she developed a cough that has progressively worsened. She describes this as a recurring seasonal pattern, noting, I'm coughing seasonally, and so when I get to the coughing stage, it gets to be very unbearable, especially when it becomes a dry cough, and I cannot stop. She reports the cough is nonproductive and interferes with her ability to speak, which significantly affects her work that requires frequent talking. She also notes that her chest has recently begun to feel heavy. Associated symptoms include rhinorrhea, sneezing, and postnasal drainage. She denies fever, chills, body aches, sore throat, headache, nausea, vomiting, diarrhea, wheezing, or shortness of breath. She does not smoke or vape.  The following sections of the patient's history were reviewed and updated as appropriate: allergies, current medications, past family history, past medical history, past social history, past surgical history, and problem list.     Past Medical History:  Diagnosis Date   Alopecia    Anemia    Anosmia    Back pain    Bilateral knee pain    Cancer (HCC)    myeloma   Chewing difficulty    Constipation    Elevated serum creatinine    Hyperlipidemia    Hypertension    Joint pain    Leukopenia    Multiple myeloma (HCC)    Neuropathy    Right and  left feet    Obesity    SOB (shortness of breath)    Thoracic aortic aneurysm without rupture     Patient Active Problem List   Diagnosis Date Noted   Elevated coronary artery calcium  score 02/13/2024   BMI 32.0-32.9,adult 01/05/2024   Obesity, Starting BMI 42.73 01/05/2024   Leukopenia 09/09/2023   Chronic kidney disease, stage 3a (HCC) 01/20/2022   SOB (shortness of breath) on exertion 01/07/2022   Vitamin D  deficiency 01/07/2022   Essential hypertension 01/07/2022   Other fatigue 07/25/2020   Prediabetes 04/13/2020   Visceral obesity 04/13/2020   Metabolic syndrome 04/13/2020   Preventive measure 01/25/2020   Leukopenia due to antineoplastic chemotherapy 01/11/2019   Other constipation 01/11/2019   Hyperlipidemia 06/23/2018   Thoracic aortic aneurysm without rupture 05/19/2018   Anosmia 11/15/2017   Obesity, Class I, BMI 30-34.9 08/18/2017   Hypotension due to drugs 04/15/2017   Sinus congestion 03/25/2017   S/P autologous bone marrow transplantation (HCC) 02/01/2017   Peripheral neuropathy due to chemotherapy 07/13/2016   Physical debility 07/13/2016   Allergic rhinitis 07/02/2016   Rash due to allergy 07/02/2016   Elevated serum creatinine 05/22/2016   Financial difficulty 05/22/2016   Pancytopenia, acquired (HCC) 04/19/2016   Multiple myeloma in remission (HCC) 04/04/2016   Normocytic anemia 03/09/2016    Past Surgical History:  Procedure Laterality Date   CATARACT EXTRACTION     EYE SURGERY     INJECTION  KNEE     LIMBAL STEM CELL TRANSPLANT      OB History     Gravida  0   Para  0   Term  0   Preterm  0   AB  0   Living  0      SAB  0   IAB  0   Ectopic  0   Multiple  0   Live Births  0            Home Medications    Prior to Admission medications   Medication Sig Start Date End Date Taking? Authorizing Provider  acetaminophen (TYLENOL) 500 MG tablet Take 500 mg by mouth every 6 (six) hours as needed.   Yes [provider]  Albuterol  Sulfate (PROAIR  RESPICLICK) 108 (90 Base) MCG/ACT AEPB Take 1 puff by mouth daily as needed.   Yes [provider]  ALLEGRA-D ALLERGY & CONGESTION 60-120 MG 12 hr tablet Take 1 tablet by mouth 2 (two) times daily. 02/14/24  Yes [provider]  Aspirin 81 MG CAPS ASPIRIN 81 MG   Yes [provider]  Azelastine-Fluticasone 137-50 MCG/ACT SUSP SMARTSIG:2 Puff(s) Both Nares Twice Daily 02/16/24  Yes [provider]  Calcium  Carb-Cholecalciferol (CALCIUM  500 + D) 500-5 MG-MCG TABS Take 1 tablet by mouth daily.   Yes [provider]  Dextromethorphan-guaiFENesin (MUCINEX DM MAXIMUM STRENGTH) 60-1200 MG TB12 Take 1 tablet by mouth 2 (two) times daily. 02/19/24  Yes Iola Lukes, FNP  docusate sodium (COLACE) 100 MG capsule Take 200 mg by mouth daily.   Yes [provider]  famotidine (PEPCID) 40 MG tablet 1 tab(s) orally 2 times per day; Duration: 90 days 12/14/22  Yes [provider]  losartan -hydrochlorothiazide (HYZAAR ) 100-25 MG tablet Take 1 tablet by mouth daily. Patient taking differently: Take 0.5 tablets by mouth daily. TAKES 1/2 TABLET DAILY 04/26/16  Yes Lonn, Almarie, MD  methylPREDNISolone (MEDROL DOSEPAK) 4 MG TBPK tablet Take as directed 02/19/24  Yes Iola Lukes, FNP  Multiple Vitamin (MULTIVITAMIN) tablet Take 1 tablet by mouth daily.   Yes [provider]  Polyethylene Glycol 3350 (MIRALAX PO) Take by mouth as needed.   Yes [provider]  promethazine-dextromethorphan (PROMETHAZINE-DM) 6.25-15 MG/5ML syrup Take 10 mLs by mouth every 6 (six) hours as needed for cough. 02/19/24  Yes Iola Lukes, FNP  rosuvastatin  (CRESTOR ) 40 MG tablet Take 1 tablet (40 mg total) by mouth at bedtime. 02/13/24  Yes [name], MD  semaglutide -weight management (WEGOVY ) 1.7 MG/0.75ML SOAJ SQ injection Inject 1.7 mg into the skin once a week. 02/15/24  Yes Danford, Rockie D, NP  TURMERIC PO   05/19/23  Yes [provider]    Family History Family History  Problem Relation Age of Onset   Cancer Mother        lymphoma   Diabetes Mother    Hypertension Mother    Hyperlipidemia Mother    Depression Mother    Obesity Mother    Heart disease Mother    Cancer Father        prostate ca   Heart disease Father    Colon cancer Maternal Grandmother    Diabetes Maternal Grandfather     Social History Social History   Tobacco Use   Smoking status: Never   Smokeless tobacco: Never  Vaping Use   Vaping status: Never Used  Substance Use Topics   Alcohol use: No   Drug use: No  Allergies   Revlimid  [lenalidomide ]   Review of Systems Review of Systems  Constitutional:  Negative for chills and fever.  HENT:  Positive for congestion, postnasal drip, rhinorrhea and sneezing. Negative for sore throat.   Respiratory:  Positive for cough (dry, non-productive) and chest tightness. Negative for shortness of breath and wheezing.   Gastrointestinal:  Negative for diarrhea, nausea and vomiting.  Musculoskeletal:  Negative for myalgias.  Neurological:  Negative for headaches.  All other systems reviewed and are negative.    Physical Exam Triage Vital Signs ED Triage Vitals  Encounter Vitals Group     BP 02/19/24 0909 131/86     Girls Systolic BP Percentile --      Girls Diastolic BP Percentile --      Boys Systolic BP Percentile --      Boys Diastolic BP Percentile --      Pulse Rate 02/19/24 0909 74     Resp 02/19/24 0909 20     Temp 02/19/24 0909 97.7 F (36.5 C)     Temp Source 02/19/24 0909 Oral     SpO2 02/19/24 0909 95 %     Weight --      Height --      Head Circumference --      Peak Flow --      Pain Score 02/19/24 0916 0     Pain Loc --      Pain Education --      Exclude from Growth Chart --    No data found.  Updated Vital Signs BP 131/86 (BP Location: Left Arm)   Pulse 74   Temp 97.7 F (36.5 C) (Oral)   Resp 20   SpO2 95%    Visual Acuity Right Eye Distance:   Left Eye Distance:   Bilateral Distance:    Right Eye Near:   Left Eye Near:    Bilateral Near:     Physical Exam Vitals reviewed.  Constitutional:      General: She is awake. She is not in acute distress.    Appearance: Normal appearance. She is well-developed. She is not ill-appearing, toxic-appearing or diaphoretic.  HENT:     Head: Normocephalic.     Right Ear: Tympanic membrane, ear canal and external ear normal. No drainage, swelling or tenderness. No middle ear effusion. Tympanic membrane is not erythematous.     Left Ear: Tympanic membrane, ear canal and external ear normal. No drainage, swelling or tenderness.  No middle ear effusion. Tympanic membrane is not erythematous.     Nose: Congestion present. No rhinorrhea.     Mouth/Throat:     Lips: Pink.     Mouth: Mucous membranes are moist.     Pharynx: No pharyngeal swelling, oropharyngeal exudate, posterior oropharyngeal erythema or uvula swelling.     Tonsils: No tonsillar exudate or tonsillar abscesses.  Eyes:     General: Vision grossly intact.     Conjunctiva/sclera: Conjunctivae normal.  Cardiovascular:     Rate and Rhythm: Normal rate.     Heart sounds: Normal heart sounds.  Pulmonary:     Effort: Pulmonary effort is normal. No tachypnea or respiratory distress.     Breath sounds: Normal breath sounds and air entry. No wheezing.     Comments: Respirations even and unlabored. No acute distress noted.  Musculoskeletal:        General: Normal range of motion.     Cervical back: Normal range of motion and neck supple.  Lymphadenopathy:  Cervical: No cervical adenopathy.  Skin:    General: Skin is warm and dry.  Neurological:     General: No focal deficit present.     Mental Status: She is alert and oriented to person, place, and time.  Psychiatric:        Behavior: Behavior is cooperative.      UC Treatments / Results  Labs (all labs ordered are listed, but only  abnormal results are displayed) Labs Reviewed - No data to display  EKG   Radiology No results found.  Procedures Procedures (including critical care time)  Medications Ordered in UC Medications  ipratropium-albuterol  (DUONEB) 0.5-2.5 (3) MG/3ML nebulizer solution 3 mL (3 mLs Nebulization Given 02/19/24 1027)    Initial Impression / Assessment and Plan / UC Course  I have reviewed the triage vital signs and the nursing notes.  Pertinent labs & imaging results that were available during my care of the patient were reviewed by me and considered in my medical decision making (see chart for details).     The patient presents with cough, wheezing, and shortness of breath. Chest X-ray showed no evidence of pneumonia or other acute abnormalities. In clinic, symptoms improved following a Duoneb treatment. The patient is afebrile, nontoxic, with even and unlabored respirations and no acute distress. Clinical impression is acute bronchitis. Medications prescribed. Supportive measures including rest, hydration, and over-the-counter symptom relief were also advised. Patient instructed to follow up with PCP if symptoms do not improve within several days and to seek emergency care for worsening shortness of breath, chest pain, persistent high fever, or other concerning symptoms.  Today's evaluation has revealed no signs of a dangerous process. Discussed diagnosis with patient and/or guardian. Patient and/or guardian aware of their diagnosis, possible red flag symptoms to watch out for and need for close follow up. Patient and/or guardian understands verbal and written discharge instructions. Patient and/or guardian comfortable with plan and disposition.  Patient and/or guardian has a clear mental status at this time, good insight into illness (after discussion and teaching) and has clear judgment to make decisions regarding their care  Documentation was completed with the aid of voice recognition  software. Transcription may contain typographical errors.  Final Clinical Impressions(s) / UC Diagnoses   Final diagnoses:  Acute bronchitis due to Rhinovirus     Discharge Instructions      You have been diagnosed with acute bronchitis, which is a sudden inflammation of the large airways in your lungs. This inflammation causes the airways to narrow and produce more mucus, making it harder to breathe and leading to coughing. Acute bronchitis is most often caused by the same viruses that cause the common cold.  Take the medications that were prescribed to you as directed. Also continue the medications that was prescribed by your primary care provider. If you have a fever, headache, or body aches, you can also take Tylenol or ibuprofen to help you feel more comfortable. Be sure to drink plenty of fluids to stay hydrated--aim for enough to keep your urine a pale yellow color. This will also help to thin mucus and make it easier to clear from your body. The prescribed cough medicine should help loosen mucus, relieve congestion, and ease your cough. Taking two teaspoons of honey at bedtime may also help reduce nighttime coughing. Avoid using any nicotine or tobacco products, as they can worsen your symptoms and delay healing.   Using a cool mist humidifier at home to keep humidity levels above 50% can  be helpful. You can also inhale steam for 10 to 15 minutes, 3 to 4 times a day. This can be done by sitting in the bathroom with a hot shower running, or by using over-the-counter vapor shower tablets to help with nasal congestion. Try to avoid cool or dry air as much as possible. Be sure to get enough rest every night to support your recovery. Don't forget to replace your toothbrush once you start feeling better.   It's normal for a cough to linger for several weeks after a respiratory illness, even after other symptoms have resolved. This happens because the airways remain irritated and take time to  fully heal. As long as the cough gradually improves and there are no new concerning symptoms, this is part of the normal recovery process.  Seek emergency care right away if you cough up blood, feel chest pain, have severe shortness of breath, faint or feel like you might faint, develop a severe headache, or experience worsening fever or chills.       ED Prescriptions     Medication Sig Dispense Auth. Provider   methylPREDNISolone (MEDROL DOSEPAK) 4 MG TBPK tablet Take as directed 21 tablet Koen Antilla, Lucie, FNP   Dextromethorphan-guaiFENesin (MUCINEX DM MAXIMUM STRENGTH) 60-1200 MG TB12 Take 1 tablet by mouth 2 (two) times daily. 20 tablet Iola Lucie, FNP   promethazine-dextromethorphan (PROMETHAZINE-DM) 6.25-15 MG/5ML syrup Take 10 mLs by mouth every 6 (six) hours as needed for cough. 118 mL Iola Lucie, FNP      PDMP not reviewed this encounter.   Iola Konawa, [place] 02/20/24 [phone]

## 2024-02-19 NOTE — Discharge Instructions (Addendum)
 You have been diagnosed with acute bronchitis, which is a sudden inflammation of the large airways in your lungs. This inflammation causes the airways to narrow and produce more mucus, making it harder to breathe and leading to coughing. Acute bronchitis is most often caused by the same viruses that cause the common cold.  Take the medications that were prescribed to you as directed. Also continue the medications that was prescribed by your primary care provider. If you have a fever, headache, or body aches, you can also take Tylenol or ibuprofen to help you feel more comfortable. Be sure to drink plenty of fluids to stay hydrated--aim for enough to keep your urine a pale yellow color. This will also help to thin mucus and make it easier to clear from your body. The prescribed cough medicine should help loosen mucus, relieve congestion, and ease your cough. Taking two teaspoons of honey at bedtime may also help reduce nighttime coughing. Avoid using any nicotine or tobacco products, as they can worsen your symptoms and delay healing.   Using a cool mist humidifier at home to keep humidity levels above 50% can be helpful. You can also inhale steam for 10 to 15 minutes, 3 to 4 times a day. This can be done by sitting in the bathroom with a hot shower running, or by using over-the-counter vapor shower tablets to help with nasal congestion. Try to avoid cool or dry air as much as possible. Be sure to get enough rest every night to support your recovery. Don't forget to replace your toothbrush once you start feeling better.   It's normal for a cough to linger for several weeks after a respiratory illness, even after other symptoms have resolved. This happens because the airways remain irritated and take time to fully heal. As long as the cough gradually improves and there are no new concerning symptoms, this is part of the normal recovery process.  Seek emergency care right away if you cough up blood, feel chest  pain, have severe shortness of breath, faint or feel like you might faint, develop a severe headache, or experience worsening fever or chills.

## 2024-02-27 ENCOUNTER — Inpatient Hospital Stay: Attending: Hematology and Oncology

## 2024-02-27 ENCOUNTER — Encounter: Payer: Self-pay | Admitting: Hematology and Oncology

## 2024-02-27 DIAGNOSIS — C9001 Multiple myeloma in remission: Secondary | ICD-10-CM | POA: Diagnosis present

## 2024-02-27 LAB — CBC WITH DIFFERENTIAL/PLATELET
Abs Immature Granulocytes: 0.01 K/uL (ref 0.00–0.07)
Basophils Absolute: 0 K/uL (ref 0.0–0.1)
Basophils Relative: 0 %
Eosinophils Absolute: 0.1 K/uL (ref 0.0–0.5)
Eosinophils Relative: 1 %
HCT: 40.3 % (ref 36.0–46.0)
Hemoglobin: 13.4 g/dL (ref 12.0–15.0)
Immature Granulocytes: 0 %
Lymphocytes Relative: 33 %
Lymphs Abs: 1.6 K/uL (ref 0.7–4.0)
MCH: 29.3 pg (ref 26.0–34.0)
MCHC: 33.3 g/dL (ref 30.0–36.0)
MCV: 88.2 fL (ref 80.0–100.0)
Monocytes Absolute: 0.4 K/uL (ref 0.1–1.0)
Monocytes Relative: 8 %
Neutro Abs: 2.7 K/uL (ref 1.7–7.7)
Neutrophils Relative %: 58 %
Platelets: 202 K/uL (ref 150–400)
RBC: 4.57 MIL/uL (ref 3.87–5.11)
RDW: 14.6 % (ref 11.5–15.5)
WBC: 4.8 K/uL (ref 4.0–10.5)
nRBC: 0 % (ref 0.0–0.2)

## 2024-02-27 LAB — COMPREHENSIVE METABOLIC PANEL WITH GFR
ALT: 10 U/L (ref 0–44)
AST: 16 U/L (ref 15–41)
Albumin: 4.1 g/dL (ref 3.5–5.0)
Alkaline Phosphatase: 60 U/L (ref 38–126)
Anion gap: 5 (ref 5–15)
BUN: 12 mg/dL (ref 8–23)
CO2: 31 mmol/L (ref 22–32)
Calcium: 9.8 mg/dL (ref 8.9–10.3)
Chloride: 100 mmol/L (ref 98–111)
Creatinine, Ser: 0.87 mg/dL (ref 0.44–1.00)
GFR, Estimated: >60 mL/min (ref >60)
Glucose, Bld: 89 mg/dL (ref 70–99)
Potassium: 4.2 mmol/L (ref 3.5–5.1)
Sodium: 136 mmol/L (ref 135–145)
Total Bilirubin: 1 mg/dL (ref 0.0–1.2)
Total Protein: 6.9 g/dL (ref 6.5–8.1)

## 2024-02-28 LAB — KAPPA/LAMBDA LIGHT CHAINS
Kappa free light chain: 14.6 mg/L (ref 3.3–19.4)
Kappa, lambda light chain ratio: 0.85 (ref 0.26–1.65)
Lambda free light chains: 17.1 mg/L (ref 5.7–26.3)

## 2024-03-02 ENCOUNTER — Other Ambulatory Visit

## 2024-03-02 LAB — MULTIPLE MYELOMA PANEL, SERUM
Albumin SerPl Elph-Mcnc: 3.5 g/dL (ref 2.9–4.4)
Albumin/Glob SerPl: 1.2 (ref 0.7–1.7)
Alpha 1: 0.2 g/dL (ref 0.0–0.4)
Alpha2 Glob SerPl Elph-Mcnc: 1 g/dL (ref 0.4–1.0)
B-Globulin SerPl Elph-Mcnc: 0.9 g/dL (ref 0.7–1.3)
Gamma Glob SerPl Elph-Mcnc: 0.9 g/dL (ref 0.4–1.8)
Globulin, Total: 3 g/dL (ref 2.2–3.9)
IgA: 148 mg/dL (ref 64–422)
IgG (Immunoglobin G), Serum: 927 mg/dL (ref 586–1602)
IgM (Immunoglobulin M), Srm: 133 mg/dL (ref 26–217)
Total Protein ELP: 6.5 g/dL (ref 6.0–8.5)

## 2024-03-13 ENCOUNTER — Encounter: Payer: Self-pay | Admitting: Hematology and Oncology

## 2024-03-13 ENCOUNTER — Inpatient Hospital Stay: Admitting: Hematology and Oncology

## 2024-03-13 VITALS — Wt 195.5 lb

## 2024-03-13 DIAGNOSIS — C9001 Multiple myeloma in remission: Secondary | ICD-10-CM | POA: Diagnosis not present

## 2024-03-13 NOTE — Progress Notes (Signed)
 HEMATOLOGY-ONCOLOGY ELECTRONIC VISIT PROGRESS NOTE  Patient Care Team: [name], MD as PCP - General (Family Medicine) [name], MD as PCP - Cardiology (Cardiology) [name], MD as Consulting Physician (Otolaryngology) [name], MD as Consulting Physician (Hematology and Oncology)  I connected with the patient via telephone conference and verified that I am speaking with the correct person using two identifiers. The patient's location is at home and I am providing care from the [facility] I discussed the limitations, risks, security and privacy concerns of performing an evaluation and management service by e-visits and the availability of in person appointments.  I also discussed with the patient that there may be a patient responsible charge related to this service. The patient expressed understanding and agreed to proceed.   ASSESSMENT & PLAN:  Multiple myeloma in remission [facility]) She was diagnosed with multiple myeloma in November 2017, stage IIa She was treated with combination of lenalidomide , dexamethasone  and Velcade  followed by bone marrow transplant in May 2018 She has subsequent relapse/residual disease and was treated with combination of Velcade , Pomalyst  and dexamethasone  and then switch over to maintenance Pomalyst  until April 2023  I have reviewed results of her recent myeloma panel Overall, she is still in complete remission I plan to repeat labs and follow-up again in 9 months  No orders of the defined types were placed in this encounter.   INTERVAL HISTORY: Please see below for problem oriented charting. The purpose of today's discussion is reviewed recent myeloma panel result She is doing well She is still working hard to lose weight, weight is down to 195 pounds today  SUMMARY OF ONCOLOGIC HISTORY: Oncology History  Multiple myeloma in remission (HCC)  03/22/2016 Bone Marrow Biopsy   Bone Marrow Biopsy: Plasma cells 17% by Aspirate and  30% by CD 138 stain. Plasma cell neoplasm, Kappa Restricted Normal cytogenetics, FISH positive for +11 and +14 and +7   04/13/2016 Imaging   Skeletal survey showed lucencies within the calvarium worrisome for myeloma. No definite abnormal lytic or blastic lesions are observed elsewhere.   04/15/2016 Cancer Staging   Staging form: Multiple Myeloma, AJCC 6th Edition - Clinical stage from 04/15/2016: Stage IIA - Signed by [name], MD on 07/31/2021 Staged by: Managing physician Diagnostic confirmation: Positive histology Specimen type: Biopsy / Limited Resection Histopathologic type: Neoplasm, malignant   04/19/2016 - 09/10/2016 Chemotherapy   The patient consented to treatment with Revlimid , dexamethasone  and Velcade    09/14/2016 Bone Marrow Biopsy   In summary, there is a normal cellular marrow with trilineage hematopoiesis.  A mild plasmacytosis is present, but there is no definitive evidence of residual multiple myeloma   10/12/2016 - 10/12/2016 Chemotherapy   She received melphalan as conditioning chemo   10/13/2016 Bone Marrow Transplant   She received autologous stem cell transplant at [facility]   01/24/2017 Bone Marrow Biopsy   BONE MARROW: -          Normocellular marrow for age (50%) with trilineage hematopoiesis. -     No morphologic or immunohistochemical evidence of involvement by plasma cell neoplasm (see comment)  PERIPHERAL BLOOD: -          Unremarkable (see CBC data)  COMMENT: The bone marrow is normocellular for age and shows adequate trilineage hematopoiesis without significant (<10%) dysplastic changes present. Blasts are not increased. Plasma cells represent about 2% of total cells on aspirate smears. Appropriately controlled immunohistochemical stains are performed on the core biopsy. CD138 highlights small interstitial plasma cells  comprising approximately 2% of total cells. These plasma cells appear polytypic as demonstrated by kappa and lambda in-situ  hybridization.   01/25/2017 PET scan   No FDG avid osseous lesions or masses are identified.   02/18/2017 - 04/01/2017 Chemotherapy   She received weekly Velcade , Pomalyst  and Dexamethasone    04/15/2017 - 08/28/2021 Chemotherapy   The patient had Pomalyst  only   04/20/2017 Bone Marrow Biopsy   Bone Marrow (BM) and Peripheral Blood (PB) FINAL PATHOLOGIC DIAGNOSIS BONE MARROW: Normocellular bone marrow (40%) with normal numbers of megakaryocytes, erythroid hyperplasia and no increase in plasma cells (1%).   10/20/2021 Imaging   Bone density is normal    I discussed the assessment and treatment plan with the patient. The patient was provided an opportunity to ask questions and all were answered. The patient agreed with the plan and demonstrated an understanding of the instructions. The patient was advised to call back or seek an in-person evaluation if the symptoms worsen or if the condition fails to improve as anticipated.    I spent 20 minutes for the appointment reviewing test results, discuss management and coordination of care.  Carolyn Bedford, MD 03/13/2024 11:50 AM

## 2024-03-13 NOTE — Assessment & Plan Note (Signed)
 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

## 2024-03-14 ENCOUNTER — Encounter (INDEPENDENT_AMBULATORY_CARE_PROVIDER_SITE_OTHER): Payer: Self-pay | Admitting: Adult Health

## 2024-03-14 ENCOUNTER — Ambulatory Visit (INDEPENDENT_AMBULATORY_CARE_PROVIDER_SITE_OTHER): Admitting: Adult Health

## 2024-03-14 VITALS — BP 126/88 | HR 63 | Temp 97.8°F | Ht 68.0 in | Wt 195.0 lb

## 2024-03-14 DIAGNOSIS — N1831 Chronic kidney disease, stage 3a: Secondary | ICD-10-CM

## 2024-03-14 DIAGNOSIS — I10 Essential (primary) hypertension: Secondary | ICD-10-CM

## 2024-03-14 DIAGNOSIS — E669 Obesity, unspecified: Secondary | ICD-10-CM

## 2024-03-14 DIAGNOSIS — Z6831 Body mass index (BMI) 31.0-31.9, adult: Secondary | ICD-10-CM

## 2024-03-14 DIAGNOSIS — R7303 Prediabetes: Secondary | ICD-10-CM | POA: Diagnosis not present

## 2024-03-14 DIAGNOSIS — I129 Hypertensive chronic kidney disease with stage 1 through stage 4 chronic kidney disease, or unspecified chronic kidney disease: Secondary | ICD-10-CM | POA: Diagnosis not present

## 2024-03-14 DIAGNOSIS — Z6829 Body mass index (BMI) 29.0-29.9, adult: Secondary | ICD-10-CM

## 2024-03-14 MED ORDER — WEGOVY 1.7 MG/0.75ML ~~LOC~~ SOAJ: 1.7000 mg | SUBCUTANEOUS | 0 refills | Status: DC

## 2024-03-14 NOTE — Progress Notes (Signed)
 WEIGHT SUMMARY AND BIOMETRICS  Vitals Temp: 97.8 F (36.6 C) BP: 126/88 Pulse Rate: 63 SpO2: 99 %   Anthropometric Measurements Height: 5' 8 (1.727 m) Weight: 195 lb (88.5 kg) BMI (Calculated): 29.66 Weight at Last Visit: 201 lb Weight Lost Since Last Visit: 6 lb Weight Gained Since Last Visit: 0 Starting Weight: 268 lb Total Weight Loss (lbs): 73 lb (33.1 kg) Peak Weight: 302 lb   Body Composition  Body Fat %: 42.5 % Fat Mass (lbs): 83 lbs Muscle Mass (lbs): 106.6 lbs Total Body Water (lbs): 70.4 lbs Visceral Fat Rating : 12   Other Clinical Data Today's Visit #: 26 Starting Date: 01/07/22    Chief Complaint:   OBESITY Gwenyth is here to discuss her progress with her obesity treatment plan.  She is on the the Category 2 Plan and states she is following her eating plan approximately 50 % of the time.  She states she is exercising Stretch Zone 30 minutes/ Stationary Bike 15 minutes 2/1 times per week.  Interim History:  She was evaluated and treated for Bronchitis early Oct 2025 She has completed all courses of ABX, Steroids, and Flonase She denies any acute respiratory sx's at present  Started on Wegovy  0.25mg  on/about 12/29/2022 Wegovy  from 1mg  to 1.7mg  on/about 11/15/2023- tolerating well. She denies sx's of hypoglcemia Patient was counseled on the importance of maintaining healthy lifestyle habits, including balanced nutrition, regular physical activity, and behavioral modifications, while taking antiobesity medication.   Patient verbalized understanding that medication is an adjunct to, not a replacement for, lifestyle changes and that the long-term success and weight maintenance depend on continued adherence to these strategies.  Reviewed Bioimpedance Results with pt: Muscle Mass: +1 lb Adipose mass: -6.8 lbs  Dec 2025: She and a friend will met in [facility] for Trip of Fun!  Subjective:   1. Essential hypertension BP at goal at OV She denies  CP with exertion She denies tobacco/vape use She is on  losartan -hydrochlorothiazide (HYZAAR ) 100-25 MG tablet  Aspirin 81 MG CAPS  rosuvastatin  (CRESTOR ) 40 MG tablet   2. Prediabetes She endorses stable appetite Started on Wegovy  0.25mg  on/about 12/29/2022 Wegovy  from 1mg  to 1.7mg  on/about 11/15/2023- tolerating well. She denies sx's of hypoglycemia  3. Chronic kidney disease, stage 3a (HCC)  Latest Reference Range & Units 05/31/23 09:19 08/30/23 13:43 02/27/24 12:58  Creatinine 0.44 - 1.00 mg/dL 8.94 (H) 9.09 9.12  (H): Data is abnormally high Assessment/Plan:   1. Essential hypertension (Primary) Continue healthy eating and regular exercise Limit Na+ intake Continue  losartan -hydrochlorothiazide (HYZAAR ) 100-25 MG tablet  Aspirin 81 MG CAPS  rosuvastatin  (CRESTOR ) 40 MG tablet   2. Prediabetes Refill semaglutide -weight management (WEGOVY ) 1.7 MG/0.75ML SOAJ SQ injection Inject 1.7 mg into the skin once a week. Dispense: 3 mL, Refills: 0 ordered   Patient was counseled on the importance of maintaining healthy lifestyle habits, including balanced nutrition, regular physical activity, and behavioral modifications, while taking antiobesity medication.   Patient verbalized understanding that medication is an adjunct to, not a replacement for, lifestyle changes and that the long-term success and weight maintenance depend on continued adherence to these strategies.  3. Chronic kidney disease, stage 3a (HCC) Keep BP well controlled and avoid Nephrotoxic substances Monitor Labs  4. BMI 31.0-31.9,adult - current BMI 29.7 Refill semaglutide -weight management (WEGOVY ) 1.7 MG/0.75ML SOAJ SQ injection Inject 1.7 mg into the skin once a week. Dispense: 3 mL, Refills: 0 ordered   Patient was counseled on the  importance of maintaining healthy lifestyle habits, including balanced nutrition, regular physical activity, and behavioral modifications, while taking antiobesity medication.    Patient verbalized understanding that medication is an adjunct to, not a replacement for, lifestyle changes and that the long-term success and weight maintenance depend on continued adherence to these strategies.  Sargun is currently in the action stage of change. As such, her goal is to continue with weight loss efforts. She has agreed to the Category 2 Plan.   Exercise goals: Older adults should follow the adult guidelines. When older adults cannot meet the adult guidelines, they should be as physically active as their abilities and conditions will allow.  Older adults should do exercises that maintain or improve balance if they are at risk of falling.  Older adults should determine their level of effort for physical activity relative to their level of fitness.  Older adults with chronic conditions should understand whether and how their conditions affect their ability to do regular physical activity safely.  Behavioral modification strategies: increasing lean protein intake, decreasing simple carbohydrates, increasing vegetables, increasing water intake, no skipping meals, meal planning and cooking strategies, keeping healthy foods in the home, ways to avoid boredom eating, travel eating strategies, and planning for success.  Sophina has agreed to follow-up with our clinic in 4 weeks. She was informed of the importance of frequent follow-up visits to maximize her success with intensive lifestyle modifications for her multiple health conditions.   Objective:   Blood pressure 126/88, pulse 63, temperature 97.8 F (36.6 C), height 5' 8 (1.727 m), weight 195 lb (88.5 kg), SpO2 99%. Body mass index is 29.65 kg/m.  General: Cooperative, alert, well developed, in no acute distress. HEENT: Conjunctivae and lids unremarkable. Cardiovascular: Regular rhythm.  Lungs: Normal work of breathing. Neurologic: No focal deficits.   Lab Results  Component Value Date   CREATININE 0.87 02/27/2024   BUN  12 02/27/2024   NA 136 02/27/2024   K 4.2 02/27/2024   CL 100 02/27/2024   CO2 31 02/27/2024   Lab Results  Component Value Date   ALT 10 02/27/2024   AST 16 02/27/2024   ALKPHOS 60 02/27/2024   BILITOT 1.0 02/27/2024   Lab Results  Component Value Date   HGBA1C 5.7 (H) 07/18/2023   HGBA1C 5.9 (H) 01/07/2022   HGBA1C 5.6 11/27/2019   Lab Results  Component Value Date   INSULIN  9.9 07/18/2023   INSULIN  6.7 01/07/2022   INSULIN  9.2 11/27/2019   Lab Results  Component Value Date   TSH 1.480 01/07/2022   Lab Results  Component Value Date   CHOL 159 01/07/2022   HDL 72 01/07/2022   LDLCALC 75 01/07/2022   TRIG 59 01/07/2022   CHOLHDL 2.2 01/07/2022   Lab Results  Component Value Date   VD25OH 60.2 07/18/2023   VD25OH 75.5 01/07/2022   VD25OH 89.78 07/18/2020   Lab Results  Component Value Date   WBC 4.8 02/27/2024   HGB 13.4 02/27/2024   HCT 40.3 02/27/2024   MCV 88.2 02/27/2024   PLT 202 02/27/2024   Lab Results  Component Value Date   IRON 108 11/27/2019   TIBC 250 11/27/2019   FERRITIN 99 11/27/2019   Attestation Statements:   Reviewed by clinician on day of visit: allergies, medications, problem list, medical history, surgical history, family history, social history, and previous encounter notes.  I have reviewed the above documentation for accuracy and completeness, and I agree with the above. -  Koi Yarbro d. Anglia Blakley,  NP-C

## 2024-03-16 DIAGNOSIS — M5416 Radiculopathy, lumbar region: Secondary | ICD-10-CM | POA: Diagnosis not present

## 2024-03-16 DIAGNOSIS — G63 Polyneuropathy in diseases classified elsewhere: Secondary | ICD-10-CM | POA: Diagnosis not present

## 2024-03-16 DIAGNOSIS — M1711 Unilateral primary osteoarthritis, right knee: Secondary | ICD-10-CM | POA: Diagnosis not present

## 2024-03-16 DIAGNOSIS — G8929 Other chronic pain: Secondary | ICD-10-CM | POA: Diagnosis not present

## 2024-03-19 DIAGNOSIS — M170 Bilateral primary osteoarthritis of knee: Secondary | ICD-10-CM | POA: Diagnosis not present

## 2024-04-16 ENCOUNTER — Ambulatory Visit (INDEPENDENT_AMBULATORY_CARE_PROVIDER_SITE_OTHER): Admitting: Family Medicine

## 2024-04-16 ENCOUNTER — Encounter (INDEPENDENT_AMBULATORY_CARE_PROVIDER_SITE_OTHER): Payer: Self-pay | Admitting: Family Medicine

## 2024-04-16 VITALS — BP 104/73 | HR 64 | Temp 97.5°F | Ht 68.0 in | Wt 192.0 lb

## 2024-04-16 DIAGNOSIS — Z6829 Body mass index (BMI) 29.0-29.9, adult: Secondary | ICD-10-CM

## 2024-04-16 DIAGNOSIS — E669 Obesity, unspecified: Secondary | ICD-10-CM

## 2024-04-16 DIAGNOSIS — I10 Essential (primary) hypertension: Secondary | ICD-10-CM | POA: Diagnosis not present

## 2024-04-16 DIAGNOSIS — R7303 Prediabetes: Secondary | ICD-10-CM | POA: Diagnosis not present

## 2024-04-16 MED ORDER — WEGOVY 1.7 MG/0.75ML ~~LOC~~ SOAJ: 1.7000 mg | SUBCUTANEOUS | 1 refills | Status: DC

## 2024-04-16 NOTE — Assessment & Plan Note (Addendum)
 Blood pressure low end of normal today.  She is on hyzaar  daily.  Last BP was within normal range.  No dizziness, lightheadedness.  Will continue current RX dose and follow up on BP at next appointment- may be able to ultimately get off one medication.

## 2024-04-16 NOTE — Progress Notes (Unsigned)
 SUBJECTIVE:  Chief Complaint: Obesity  Interim History: overall patient has had a really good experience so far here at this clinic.  She has been dealing with recurrent sinusitis this year and has had to take medrol  dose packs frequently.  Since last appointment patient had a 1 week vacation in [place].  She met a classmate this is a yearly reoccurance for her.  She didn't have much to eat on Thanksgiving and voices that sometimes she isn't eating enough.  She hasn't had much drive for sweet recently.  For the upcoming month she is not anticipating much in terms of work.  She is planning on more home cleaning and stretching and wants to get to the gym more consistently to get more weight training in.   Carolyn Newman is here to discuss her progress with her obesity treatment plan. She is on the Category 2 Plan and states she is following her eating plan approximately 70 % of the time. She states she is walking 15-20 minutes 3 times per week.   OBJECTIVE: Visit Diagnoses: Problem List Items Addressed This Visit       Cardiovascular and Mediastinum   Essential hypertension - Primary   Blood pressure low end of normal today.  She is on hyzaar  daily.  Last BP was within normal range.  No dizziness, lightheadedness.  Will continue current RX dose and follow up on BP at next appointment- may be able to ultimately get off one medication.        Other   Prediabetes   Relevant Medications   semaglutide -weight management (WEGOVY ) 1.7 MG/0.75ML SOAJ SQ injection   Obesity, Starting BMI 42.73   Relevant Medications   semaglutide -weight management (WEGOVY ) 1.7 MG/0.75ML SOAJ SQ injection   Other Visit Diagnoses       BMI 29.0-29.9,adult           Vitals Temp: (!) 97.5 F (36.4 C) BP: 104/73 Pulse Rate: 64 SpO2: 98 %   Anthropometric Measurements Height: 5' 8 (1.727 m) Weight: 192 lb (87.1 kg) BMI (Calculated): 29.2 Weight at Last Visit: 195 lb Weight Lost Since Last Visit:  3 Weight Gained Since Last Visit: 0 Starting Weight: 268 lb Total Weight Loss (lbs): 76 lb (34.5 kg)   Body Composition  Body Fat %: 42.2 % Fat Mass (lbs): 81.2 lbs Muscle Mass (lbs): 105.6 lbs Total Body Water (lbs): 71.4 lbs Visceral Fat Rating : 12   Other Clinical Data Today's Visit #: 52 Starting Date: 01/07/22 Comments: Cat 2     ASSESSMENT AND PLAN: Assessment & Plan Essential hypertension Blood pressure low end of normal today.  She is on hyzaar  daily.  Last BP was within normal range.  No dizziness, lightheadedness.  Will continue current RX dose and follow up on BP at next appointment- may be able to ultimately get off one medication. Prediabetes Labs with Dr. Benjamine at the end of December- patient to tell [facility]'s office to fax results here. BMI 29.0-29.9,adult  Obesity, Starting BMI 42.73    Diet: [name] is currently in the action stage of change. As such, her goal is to continue with weight loss efforts and has agreed to the Category 2 Plan.   Exercise:  Older adults should determine their level of effort for physical activity relative to their level of fitness.  Behavior Modification:  We discussed the following Behavioral Modification Strategies today: increasing lean protein intake, decreasing simple carbohydrates, increasing vegetables, meal planning and cooking strategies, holiday eating strategies, and planning for  success. We discussed various medication options to help [name] with her weight loss efforts and we both agreed to continue wegovy  at current dosage.  Return in about 5 weeks (around 05/21/2024).   She was informed of the importance of frequent follow up visits to maximize her success with intensive lifestyle modifications for her multiple health conditions.  Attestation Statements:   Reviewed by clinician on day of visit: allergies, medications, problem list, medical history, surgical history, family history, social history, and previous  encounter notes.     Adelita Cho, MD

## 2024-04-16 NOTE — Assessment & Plan Note (Signed)
 Labs with Dr. Benjamine at the end of December- patient to tell [facility]'s office to fax results here.

## 2024-04-17 ENCOUNTER — Ambulatory Visit (INDEPENDENT_AMBULATORY_CARE_PROVIDER_SITE_OTHER): Admitting: Adult Health

## 2024-05-08 ENCOUNTER — Encounter: Payer: Self-pay | Admitting: Hematology and Oncology

## 2024-05-22 ENCOUNTER — Encounter (INDEPENDENT_AMBULATORY_CARE_PROVIDER_SITE_OTHER): Payer: Self-pay | Admitting: Adult Health

## 2024-05-22 ENCOUNTER — Ambulatory Visit (INDEPENDENT_AMBULATORY_CARE_PROVIDER_SITE_OTHER): Admitting: Adult Health

## 2024-05-22 VITALS — BP 98/63 | HR 82 | Temp 97.6°F | Ht 68.0 in | Wt 190.0 lb

## 2024-05-22 DIAGNOSIS — I10 Essential (primary) hypertension: Secondary | ICD-10-CM | POA: Diagnosis not present

## 2024-05-22 DIAGNOSIS — E669 Obesity, unspecified: Secondary | ICD-10-CM

## 2024-05-22 DIAGNOSIS — R7982 Elevated C-reactive protein (CRP): Secondary | ICD-10-CM | POA: Diagnosis not present

## 2024-05-22 DIAGNOSIS — Z6829 Body mass index (BMI) 29.0-29.9, adult: Secondary | ICD-10-CM

## 2024-05-22 DIAGNOSIS — R7303 Prediabetes: Secondary | ICD-10-CM | POA: Diagnosis not present

## 2024-05-22 DIAGNOSIS — Z6828 Body mass index (BMI) 28.0-28.9, adult: Secondary | ICD-10-CM | POA: Diagnosis not present

## 2024-05-23 ENCOUNTER — Ambulatory Visit (INDEPENDENT_AMBULATORY_CARE_PROVIDER_SITE_OTHER): Admitting: Adult Health

## 2024-05-23 NOTE — Progress Notes (Signed)
 "    WEIGHT SUMMARY AND BIOMETRICS  Vitals Temp: 97.6 F (36.4 C) BP: 98/63 Pulse Rate: 82 SpO2: 99 %   Anthropometric Measurements Height: 5' 8 (1.727 m) Weight: 190 lb (86.2 kg) BMI (Calculated): 28.9 Weight at Last Visit: 192lb Weight Lost Since Last Visit: 2lb Weight Gained Since Last Visit: 0lb Starting Weight: 268lb Total Weight Loss (lbs): 78 lb (35.4 kg)   Body Composition  Body Fat %: 42.4 % Fat Mass (lbs): 80.6 lbs Muscle Mass (lbs): 103.8 lbs Total Body Water (lbs): 74 lbs Visceral Fat Rating : 12   Other Clinical Data Fasting: No Labs: No Today's Visit #: 28 Starting Date: 01/07/22    Chief Complaint:   OBESITY Carolyn Newman is here to discuss her progress with her obesity treatment plan.  She is on the the Category 2 Plan and states she is following her eating plan approximately 70 % of the time.  She states she is exercising Walking/Strength Training 60 minutes 3 times per week.  Interim History:  She has been working more remotely and her sister recently retired. Her sister has been able to spend more time with their brother, which has removed stress from Carolyn Newman.  She is pleased to have lost weight over the holiday season.  Current weight 190 lbs with corresponding BMI 28  She would like to focus on strength training and increasing muscle mass this year.  Subjective:   1. Essential hypertension BP soft at OV She denies sx's of hypotension EPIC review demonstrates SBP ranging from upper 90s to low 130s She is currently on losartan -hydrochlorothiazide (HYZAAR) 100-25 MG tablet  Aspirin 81 MG CAPS  rosuvastatin  (CRESTOR ) 40 MG tablet  Wegovy  1.7mg - managed by [facility]  2. Prediabetes Started on Wegovy  0.25mg  on/about 12/29/2022 Wegovy  increased from 0.25mg  to 0.5mg  on/about 04/12/2023 Wegovy  increased from 0.5mg  to 1mg  on/about 07/18/2023 Wegovy  from 1mg  to 1.7mg  on/about 11/15/2023  Denies mass in neck, dysphagia, dyspepsia, persistent  hoarseness, abdominal pain, or N/V/C  Patient was counseled on the importance of maintaining healthy lifestyle habits, including balanced nutrition, regular physical activity, and behavioral modifications, while taking antiobesity medication.   Patient verbalized understanding that medication is an adjunct to, not a replacement for, lifestyle changes and that the long-term success and weight maintenance depend on continued adherence to these strategies.   3. CRP elevated 05/04/2024 PCP OV Notes 05/04/2024 The patient seems to be doing well her blood pressure is well-controlled her weight is down my concern though is that she may be losing muscle mass and we had a conversation about that her hs-CRP also was greatly elevated we took time to explain exactly what that was and what she could do about it she feels it is fact that she is not getting enough sleep and maybe her cholesterol   Assessment/Plan:   1. Essential hypertension (Primary) Increase water intake Monitor for sx's of hypotension If SBP consistently < 90 or dizziness develops- f/u with PCP  2. Prediabetes Continue healthy eating and regular exercise Continue weekly GLP-1 therapy  3. CRP elevated F/u with PCP  4. BMI 29.0-29.9,adult, CURRENT BMI 28.9  Carolyn Newman is currently in the action stage of change. As such, her goal is to continue with weight loss efforts. She has agreed to the Category 2 Plan.   Exercise goals: Older adults should follow the adult guidelines. When older adults cannot meet the adult guidelines, they should be as physically active as their abilities and conditions will allow.  Older adults should  do exercises that maintain or improve balance if they are at risk of falling.  Older adults should determine their level of effort for physical activity relative to their level of fitness.  Older adults with chronic conditions should understand whether and how their conditions affect their ability to do regular physical  activity safely.  Behavioral modification strategies: increasing lean protein intake, decreasing simple carbohydrates, increasing vegetables, increasing water intake, meal planning and cooking strategies, keeping healthy foods in the home, and ways to avoid boredom eating.  Justyn has agreed to follow-up with our clinic in 4 weeks. She was informed of the importance of frequent follow-up visits to maximize her success with intensive lifestyle modifications for her multiple health conditions.   Objective:   Blood pressure 98/63, pulse 82, temperature 97.6 F (36.4 C), height 5' 8 (1.727 m), weight 190 lb (86.2 kg), SpO2 99%. Body mass index is 28.89 kg/m.  General: Cooperative, alert, well developed, in no acute distress. HEENT: Conjunctivae and lids unremarkable. Cardiovascular: Regular rhythm.  Lungs: Normal work of breathing. Neurologic: No focal deficits.   Lab Results  Component Value Date   CREATININE 0.87 02/27/2024   BUN 12 02/27/2024   NA 136 02/27/2024   K 4.2 02/27/2024   CL 100 02/27/2024   CO2 31 02/27/2024   Lab Results  Component Value Date   ALT 10 02/27/2024   AST 16 02/27/2024   ALKPHOS 60 02/27/2024   BILITOT 1.0 02/27/2024   Lab Results  Component Value Date   HGBA1C 5.7 (H) 07/18/2023   HGBA1C 5.9 (H) 01/07/2022   HGBA1C 5.6 11/27/2019   Lab Results  Component Value Date   INSULIN  9.9 07/18/2023   INSULIN  6.7 01/07/2022   INSULIN  9.2 11/27/2019   Lab Results  Component Value Date   TSH 1.480 01/07/2022   Lab Results  Component Value Date   CHOL 159 01/07/2022   HDL 72 01/07/2022   LDLCALC 75 01/07/2022   TRIG 59 01/07/2022   CHOLHDL 2.2 01/07/2022   Lab Results  Component Value Date   VD25OH 60.2 07/18/2023   VD25OH 75.5 01/07/2022   VD25OH 89.78 07/18/2020   Lab Results  Component Value Date   WBC 4.8 02/27/2024   HGB 13.4 02/27/2024   HCT 40.3 02/27/2024   MCV 88.2 02/27/2024   PLT 202 02/27/2024   Lab Results   Component Value Date   IRON 108 11/27/2019   TIBC 250 11/27/2019   FERRITIN 99 11/27/2019   Attestation Statements:   Reviewed by clinician on day of visit: allergies, medications, problem list, medical history, surgical history, family history, social history, and previous encounter notes.  Time spent on visit including pre-visit chart review and post-visit care and charting was 28 minutes.   I have reviewed the above documentation for accuracy and completeness, and I agree with the above. -  Yianni Skilling d. Brizza Nathanson, NP-C "

## 2024-06-14 ENCOUNTER — Encounter (INDEPENDENT_AMBULATORY_CARE_PROVIDER_SITE_OTHER): Payer: Self-pay | Admitting: Adult Health

## 2024-06-14 ENCOUNTER — Ambulatory Visit (INDEPENDENT_AMBULATORY_CARE_PROVIDER_SITE_OTHER): Admitting: Adult Health

## 2024-06-14 VITALS — BP 98/60 | HR 87 | Temp 98.2°F | Ht 68.0 in | Wt 184.0 lb

## 2024-06-14 DIAGNOSIS — Z6828 Body mass index (BMI) 28.0-28.9, adult: Secondary | ICD-10-CM

## 2024-06-14 DIAGNOSIS — I10 Essential (primary) hypertension: Secondary | ICD-10-CM

## 2024-06-14 DIAGNOSIS — R7303 Prediabetes: Secondary | ICD-10-CM

## 2024-06-14 DIAGNOSIS — E669 Obesity, unspecified: Secondary | ICD-10-CM

## 2024-06-14 DIAGNOSIS — C9001 Multiple myeloma in remission: Secondary | ICD-10-CM

## 2024-06-14 DIAGNOSIS — R7982 Elevated C-reactive protein (CRP): Secondary | ICD-10-CM

## 2024-06-14 DIAGNOSIS — Z6829 Body mass index (BMI) 29.0-29.9, adult: Secondary | ICD-10-CM

## 2024-06-14 MED ORDER — WEGOVY 1.7 MG/0.75ML ~~LOC~~ SOAJ: 1.7000 mg | SUBCUTANEOUS | 1 refills | Status: AC

## 2024-06-14 NOTE — Progress Notes (Signed)
 "    WEIGHT SUMMARY AND BIOMETRICS  Vitals Temp: 98.2 F (36.8 C) BP: 98/60 Pulse Rate: 87 SpO2: 98 %   Anthropometric Measurements Height: 5' 8 (1.727 m) Weight: 184 lb (83.5 kg) BMI (Calculated): 27.98 Weight at Last Visit: 190lb Weight Lost Since Last Visit: 6lb Weight Gained Since Last Visit: 0lb Starting Weight: 268lb Total Weight Loss (lbs): 84 lb (38.1 kg)   Body Composition  Body Fat %: 41.6 % Fat Mass (lbs): 76.8 lbs Muscle Mass (lbs): 102.4 lbs Total Body Water (lbs): 70 lbs Visceral Fat Rating : 11   Other Clinical Data Fasting: No Labs: no Today's Visit #: 29 Starting Date: 01/07/22 Comments: Cat 2    Chief Complaint:   OBESITY Carolyn Newman is here to discuss her progress with her obesity treatment plan.  She is on the the Category 2 Plan and states she is following her eating plan approximately 60 % of the time.  She states she is exercising Walking/Stationary Bike 30 minutes 2 times per week.  Interim History:  Started on Wegovy  0.25mg  on/about 12/29/2022 Wegovy  increased from 0.25mg  to 0.5mg  on/about 04/12/2023 Wegovy  increased from 0.5mg  to 1mg  on/about 07/18/2023 Denies mass in neck, dysphagia, dyspepsia, persistent hoarseness, abdominal pain, or N/V/C  Wegovy  from 1mg  to 1.7mg  on/about 11/15/2023  She has lost 90 lbs! Current weight 184 lbs with corresponding BMI 28.1 Goal weight < 180 lbs  Consider beginning Mx Phase at next OV- recommend checking fasting IC and labs at next OV  Subjective:   1. Essential hypertension BP soft yet stable She denies sx's of hypotension  2. Prediabetes Started on Wegovy  0.25mg  on/about 12/29/2022 Wegovy  increased from 0.25mg  to 0.5mg  on/about 04/12/2023 Wegovy  increased from 0.5mg  to 1mg  on/about 07/18/2023 Denies mass in neck, dysphagia, dyspepsia, persistent hoarseness, abdominal pain, or N/V/C  Wegovy  from 1mg  to 1.7mg  on/about 11/15/2023  Denies mass in neck, dysphagia, dyspepsia, persistent hoarseness,  abdominal pain, or N/V/C  She denies sx's of hypoglycemia  3. Multiple Myeloma in remission [facility]) She is followed by Oncology 03/13/2024 OV Notes ASSESSMENT & PLAN:  Multiple myeloma in remission [facility]) She was diagnosed with multiple myeloma in November 2017, stage IIa She was treated with combination of lenalidomide , dexamethasone  and Velcade  followed by bone marrow transplant in May 2018 She has subsequent relapse/residual disease and was treated with combination of Velcade , Pomalyst  and dexamethasone  and then switch over to maintenance Pomalyst  until April 2023   I have reviewed results of her recent myeloma panel Overall, she is still in complete remission I plan to repeat labs and follow-up again in 9 months   No orders of the defined types were placed in this encounter.   Assessment/Plan:   1. Essential hypertension (Primary) Remain well hydrated Monitor for sx's hypotension  2. Prediabetes Refill - semaglutide -weight management (WEGOVY ) 1.7 MG/0.75ML SOAJ SQ injection; Inject 1.7 mg into the skin once a week.  Dispense: 3 mL; Refill: 1  3. Multiple Myeloma in remission (HCC) Continue healthy eating and regular exercise Continue regular f/u with established Oncology=ist  4. BMI 29.0-29.9,adult, CURRENT BMI 28.1 Refill - semaglutide -weight management (WEGOVY ) 1.7 MG/0.75ML SOAJ SQ injection; Inject 1.7 mg into the skin once a week.  Dispense: 3 mL; Refill: 1  Patient was counseled on the importance of maintaining healthy lifestyle habits, including balanced nutrition, regular physical activity, and behavioral modifications, while taking antiobesity medication.   Patient verbalized understanding that medication is an adjunct to, not a replacement for, lifestyle changes and that the long-term success  and weight maintenance depend on continued adherence to these strategies.   Carolyn Newman is currently in the action stage of change. As such, her goal is to continue with weight loss  efforts. She has agreed to the Category 2 Plan.   Exercise goals: Older adults should follow the adult guidelines. When older adults cannot meet the adult guidelines, they should be as physically active as their abilities and conditions will allow.  Older adults should do exercises that maintain or improve balance if they are at risk of falling.  Older adults should determine their level of effort for physical activity relative to their level of fitness.  Older adults with chronic conditions should understand whether and how their conditions affect their ability to do regular physical activity safely.  Behavioral modification strategies: increasing lean protein intake, decreasing simple carbohydrates, increasing vegetables, increasing water intake, no skipping meals, meal planning and cooking strategies, keeping healthy foods in the home, ways to avoid boredom eating, and planning for success.  Carolyn Newman has agreed to follow-up with our clinic in 4 weeks. She was informed of the importance of frequent follow-up visits to maximize her success with intensive lifestyle modifications for her multiple health conditions.   Check Fasting Labs and IC at next OV- pt aware to arrive 30 mins early and to be fasting  Objective:   Blood pressure 98/60, pulse 87, temperature 98.2 F (36.8 C), height 5' 8 (1.727 m), weight 184 lb (83.5 kg), SpO2 98%. Body mass index is 27.98 kg/m.  General: Cooperative, alert, well developed, in no acute distress. HEENT: Conjunctivae and lids unremarkable. Cardiovascular: Regular rhythm.  Lungs: Normal work of breathing. Neurologic: No focal deficits.   Lab Results  Component Value Date   CREATININE 0.87 02/27/2024   BUN 12 02/27/2024   NA 136 02/27/2024   K 4.2 02/27/2024   CL 100 02/27/2024   CO2 31 02/27/2024   Lab Results  Component Value Date   ALT 10 02/27/2024   AST 16 02/27/2024   ALKPHOS 60 02/27/2024   BILITOT 1.0 02/27/2024   Lab Results  Component  Value Date   HGBA1C 5.7 (H) 07/18/2023   HGBA1C 5.9 (H) 01/07/2022   HGBA1C 5.6 11/27/2019   Lab Results  Component Value Date   INSULIN  9.9 07/18/2023   INSULIN  6.7 01/07/2022   INSULIN  9.2 11/27/2019   Lab Results  Component Value Date   TSH 1.480 01/07/2022   Lab Results  Component Value Date   CHOL 159 01/07/2022   HDL 72 01/07/2022   LDLCALC 75 01/07/2022   TRIG 59 01/07/2022   CHOLHDL 2.2 01/07/2022   Lab Results  Component Value Date   VD25OH 60.2 07/18/2023   VD25OH 75.5 01/07/2022   VD25OH 89.78 07/18/2020   Lab Results  Component Value Date   WBC 4.8 02/27/2024   HGB 13.4 02/27/2024   HCT 40.3 02/27/2024   MCV 88.2 02/27/2024   PLT 202 02/27/2024   Lab Results  Component Value Date   IRON 108 11/27/2019   TIBC 250 11/27/2019   FERRITIN 99 11/27/2019   Attestation Statements:   Reviewed by clinician on day of visit: allergies, medications, problem list, medical history, surgical history, family history, social history, and previous encounter notes.  I have reviewed the above documentation for accuracy and completeness, and I agree with the above. -  Dannon Perlow d. Raihan Kimmel, NP-C "

## 2024-06-19 ENCOUNTER — Ambulatory Visit (INDEPENDENT_AMBULATORY_CARE_PROVIDER_SITE_OTHER): Admitting: Adult Health

## 2024-07-10 ENCOUNTER — Ambulatory Visit (INDEPENDENT_AMBULATORY_CARE_PROVIDER_SITE_OTHER): Admitting: Physician Assistant

## 2024-11-30 ENCOUNTER — Inpatient Hospital Stay

## 2024-12-10 ENCOUNTER — Inpatient Hospital Stay: Admitting: Hematology and Oncology
